# Patient Record
Sex: Female | Born: 1950
Health system: Southern US, Community
[De-identification: ages and names within clinical notes are randomized; demographics above are authoritative.]

## PROBLEM LIST (undated history)

## (undated) ENCOUNTER — Emergency Department (HOSPITAL_COMMUNITY): Admission: EM | Payer: Medicare HMO | Source: Home / Self Care

## (undated) DIAGNOSIS — J449 Chronic obstructive pulmonary disease, unspecified: Secondary | ICD-10-CM

## (undated) DIAGNOSIS — N179 Acute kidney failure, unspecified: Secondary | ICD-10-CM

## (undated) DIAGNOSIS — R0600 Dyspnea, unspecified: Secondary | ICD-10-CM

## (undated) DIAGNOSIS — I509 Heart failure, unspecified: Secondary | ICD-10-CM

## (undated) DIAGNOSIS — E119 Type 2 diabetes mellitus without complications: Secondary | ICD-10-CM

## (undated) DIAGNOSIS — I214 Non-ST elevation (NSTEMI) myocardial infarction: Secondary | ICD-10-CM

## (undated) DIAGNOSIS — I10 Essential (primary) hypertension: Secondary | ICD-10-CM

---
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
## 2018-02-13 DIAGNOSIS — H5203 Hypermetropia, bilateral: Secondary | ICD-10-CM | POA: Diagnosis not present

## 2018-02-13 DIAGNOSIS — E119 Type 2 diabetes mellitus without complications: Secondary | ICD-10-CM | POA: Diagnosis not present

## 2018-02-13 DIAGNOSIS — H524 Presbyopia: Secondary | ICD-10-CM | POA: Diagnosis not present

## 2018-02-21 DIAGNOSIS — F17210 Nicotine dependence, cigarettes, uncomplicated: Secondary | ICD-10-CM | POA: Diagnosis not present

## 2018-02-21 DIAGNOSIS — I10 Essential (primary) hypertension: Secondary | ICD-10-CM | POA: Diagnosis not present

## 2018-02-21 DIAGNOSIS — E119 Type 2 diabetes mellitus without complications: Secondary | ICD-10-CM | POA: Diagnosis not present

## 2018-02-22 ENCOUNTER — Other Ambulatory Visit: Payer: Self-pay | Admitting: Family Medicine

## 2018-02-22 DIAGNOSIS — Z1231 Encounter for screening mammogram for malignant neoplasm of breast: Secondary | ICD-10-CM

## 2018-02-22 DIAGNOSIS — R5381 Other malaise: Secondary | ICD-10-CM

## 2018-02-23 ENCOUNTER — Other Ambulatory Visit: Payer: Self-pay | Admitting: Family Medicine

## 2018-02-23 DIAGNOSIS — E2839 Other primary ovarian failure: Secondary | ICD-10-CM

## 2018-03-06 DIAGNOSIS — H40053 Ocular hypertension, bilateral: Secondary | ICD-10-CM | POA: Diagnosis not present

## 2018-03-06 DIAGNOSIS — H40013 Open angle with borderline findings, low risk, bilateral: Secondary | ICD-10-CM | POA: Diagnosis not present

## 2018-05-09 ENCOUNTER — Ambulatory Visit
Admission: RE | Admit: 2018-05-09 | Discharge: 2018-05-09 | Disposition: A | Discharge status: Home / Self Care | Payer: Medicare HMO | Source: Ambulatory Visit | Attending: Family Medicine | Admitting: Family Medicine

## 2018-05-09 ENCOUNTER — Encounter: Payer: Self-pay | Admitting: Radiology

## 2018-05-09 DIAGNOSIS — Z780 Asymptomatic menopausal state: Secondary | ICD-10-CM | POA: Diagnosis not present

## 2018-05-09 DIAGNOSIS — Z1231 Encounter for screening mammogram for malignant neoplasm of breast: Secondary | ICD-10-CM | POA: Diagnosis not present

## 2018-05-09 DIAGNOSIS — M85851 Other specified disorders of bone density and structure, right thigh: Secondary | ICD-10-CM | POA: Diagnosis not present

## 2018-05-09 DIAGNOSIS — E2839 Other primary ovarian failure: Secondary | ICD-10-CM

## 2018-05-30 DIAGNOSIS — I10 Essential (primary) hypertension: Secondary | ICD-10-CM | POA: Diagnosis not present

## 2018-05-30 DIAGNOSIS — F17210 Nicotine dependence, cigarettes, uncomplicated: Secondary | ICD-10-CM | POA: Diagnosis not present

## 2018-05-30 DIAGNOSIS — E119 Type 2 diabetes mellitus without complications: Secondary | ICD-10-CM | POA: Diagnosis not present

## 2018-08-06 ENCOUNTER — Inpatient Hospital Stay (HOSPITAL_COMMUNITY): Payer: Medicare HMO

## 2018-08-06 ENCOUNTER — Inpatient Hospital Stay (HOSPITAL_COMMUNITY)
Admission: EM | Admit: 2018-08-06 | Discharge: 2018-08-17 | DRG: 246 | Disposition: A | Discharge status: Home Health | Payer: Medicare HMO | Attending: Internal Medicine | Admitting: Internal Medicine

## 2018-08-06 ENCOUNTER — Emergency Department (HOSPITAL_COMMUNITY): Payer: Medicare HMO

## 2018-08-06 ENCOUNTER — Encounter (HOSPITAL_COMMUNITY): Payer: Self-pay | Admitting: Emergency Medicine

## 2018-08-06 DIAGNOSIS — I272 Pulmonary hypertension, unspecified: Secondary | ICD-10-CM | POA: Diagnosis present

## 2018-08-06 DIAGNOSIS — R34 Anuria and oliguria: Secondary | ICD-10-CM | POA: Diagnosis not present

## 2018-08-06 DIAGNOSIS — J449 Chronic obstructive pulmonary disease, unspecified: Secondary | ICD-10-CM | POA: Diagnosis present

## 2018-08-06 DIAGNOSIS — F17210 Nicotine dependence, cigarettes, uncomplicated: Secondary | ICD-10-CM | POA: Diagnosis present

## 2018-08-06 DIAGNOSIS — E1165 Type 2 diabetes mellitus with hyperglycemia: Secondary | ICD-10-CM | POA: Diagnosis present

## 2018-08-06 DIAGNOSIS — E119 Type 2 diabetes mellitus without complications: Secondary | ICD-10-CM

## 2018-08-06 DIAGNOSIS — J984 Other disorders of lung: Secondary | ICD-10-CM | POA: Diagnosis not present

## 2018-08-06 DIAGNOSIS — J811 Chronic pulmonary edema: Secondary | ICD-10-CM

## 2018-08-06 DIAGNOSIS — Z01818 Encounter for other preprocedural examination: Secondary | ICD-10-CM

## 2018-08-06 DIAGNOSIS — I428 Other cardiomyopathies: Secondary | ICD-10-CM | POA: Diagnosis present

## 2018-08-06 DIAGNOSIS — R0600 Dyspnea, unspecified: Secondary | ICD-10-CM

## 2018-08-06 DIAGNOSIS — R0902 Hypoxemia: Secondary | ICD-10-CM | POA: Diagnosis not present

## 2018-08-06 DIAGNOSIS — E874 Mixed disorder of acid-base balance: Secondary | ICD-10-CM | POA: Diagnosis present

## 2018-08-06 DIAGNOSIS — I5021 Acute systolic (congestive) heart failure: Secondary | ICD-10-CM | POA: Diagnosis not present

## 2018-08-06 DIAGNOSIS — I10 Essential (primary) hypertension: Secondary | ICD-10-CM | POA: Diagnosis not present

## 2018-08-06 DIAGNOSIS — I361 Nonrheumatic tricuspid (valve) insufficiency: Secondary | ICD-10-CM | POA: Diagnosis not present

## 2018-08-06 DIAGNOSIS — R570 Cardiogenic shock: Secondary | ICD-10-CM | POA: Diagnosis not present

## 2018-08-06 DIAGNOSIS — Z9911 Dependence on respirator [ventilator] status: Secondary | ICD-10-CM | POA: Diagnosis not present

## 2018-08-06 DIAGNOSIS — J9690 Respiratory failure, unspecified, unspecified whether with hypoxia or hypercapnia: Secondary | ICD-10-CM | POA: Diagnosis not present

## 2018-08-06 DIAGNOSIS — E669 Obesity, unspecified: Secondary | ICD-10-CM | POA: Diagnosis present

## 2018-08-06 DIAGNOSIS — I5041 Acute combined systolic (congestive) and diastolic (congestive) heart failure: Secondary | ICD-10-CM | POA: Diagnosis present

## 2018-08-06 DIAGNOSIS — I340 Nonrheumatic mitral (valve) insufficiency: Secondary | ICD-10-CM

## 2018-08-06 DIAGNOSIS — Z955 Presence of coronary angioplasty implant and graft: Secondary | ICD-10-CM

## 2018-08-06 DIAGNOSIS — I2510 Atherosclerotic heart disease of native coronary artery without angina pectoris: Secondary | ICD-10-CM | POA: Diagnosis not present

## 2018-08-06 DIAGNOSIS — Z538 Procedure and treatment not carried out for other reasons: Secondary | ICD-10-CM | POA: Diagnosis not present

## 2018-08-06 DIAGNOSIS — I509 Heart failure, unspecified: Secondary | ICD-10-CM | POA: Diagnosis not present

## 2018-08-06 DIAGNOSIS — R609 Edema, unspecified: Secondary | ICD-10-CM

## 2018-08-06 DIAGNOSIS — N179 Acute kidney failure, unspecified: Secondary | ICD-10-CM | POA: Diagnosis not present

## 2018-08-06 DIAGNOSIS — R42 Dizziness and giddiness: Secondary | ICD-10-CM | POA: Diagnosis not present

## 2018-08-06 DIAGNOSIS — E872 Acidosis, unspecified: Secondary | ICD-10-CM

## 2018-08-06 DIAGNOSIS — R000 Tachycardia, unspecified: Secondary | ICD-10-CM | POA: Diagnosis not present

## 2018-08-06 DIAGNOSIS — I5043 Acute on chronic combined systolic (congestive) and diastolic (congestive) heart failure: Secondary | ICD-10-CM | POA: Diagnosis not present

## 2018-08-06 DIAGNOSIS — Z6827 Body mass index (BMI) 27.0-27.9, adult: Secondary | ICD-10-CM

## 2018-08-06 DIAGNOSIS — I214 Non-ST elevation (NSTEMI) myocardial infarction: Principal | ICD-10-CM

## 2018-08-06 DIAGNOSIS — Z79899 Other long term (current) drug therapy: Secondary | ICD-10-CM

## 2018-08-06 DIAGNOSIS — Z4682 Encounter for fitting and adjustment of non-vascular catheter: Secondary | ICD-10-CM | POA: Diagnosis not present

## 2018-08-06 DIAGNOSIS — I371 Nonrheumatic pulmonary valve insufficiency: Secondary | ICD-10-CM | POA: Diagnosis present

## 2018-08-06 DIAGNOSIS — I071 Rheumatic tricuspid insufficiency: Secondary | ICD-10-CM | POA: Diagnosis present

## 2018-08-06 DIAGNOSIS — Z794 Long term (current) use of insulin: Secondary | ICD-10-CM

## 2018-08-06 DIAGNOSIS — J810 Acute pulmonary edema: Secondary | ICD-10-CM | POA: Diagnosis not present

## 2018-08-06 DIAGNOSIS — J9601 Acute respiratory failure with hypoxia: Secondary | ICD-10-CM | POA: Diagnosis present

## 2018-08-06 DIAGNOSIS — Z9289 Personal history of other medical treatment: Secondary | ICD-10-CM

## 2018-08-06 DIAGNOSIS — R079 Chest pain, unspecified: Secondary | ICD-10-CM | POA: Diagnosis not present

## 2018-08-06 DIAGNOSIS — E876 Hypokalemia: Secondary | ICD-10-CM | POA: Diagnosis not present

## 2018-08-06 DIAGNOSIS — J9602 Acute respiratory failure with hypercapnia: Secondary | ICD-10-CM | POA: Diagnosis not present

## 2018-08-06 DIAGNOSIS — Z452 Encounter for adjustment and management of vascular access device: Secondary | ICD-10-CM

## 2018-08-06 DIAGNOSIS — R0602 Shortness of breath: Secondary | ICD-10-CM

## 2018-08-06 DIAGNOSIS — R0989 Other specified symptoms and signs involving the circulatory and respiratory systems: Secondary | ICD-10-CM | POA: Diagnosis not present

## 2018-08-06 DIAGNOSIS — G9340 Encephalopathy, unspecified: Secondary | ICD-10-CM | POA: Diagnosis not present

## 2018-08-06 DIAGNOSIS — E878 Other disorders of electrolyte and fluid balance, not elsewhere classified: Secondary | ICD-10-CM | POA: Diagnosis not present

## 2018-08-06 DIAGNOSIS — J181 Lobar pneumonia, unspecified organism: Secondary | ICD-10-CM | POA: Diagnosis not present

## 2018-08-06 DIAGNOSIS — I110 Hypertensive heart disease with heart failure: Secondary | ICD-10-CM | POA: Diagnosis not present

## 2018-08-06 DIAGNOSIS — R739 Hyperglycemia, unspecified: Secondary | ICD-10-CM | POA: Diagnosis not present

## 2018-08-06 DIAGNOSIS — R05 Cough: Secondary | ICD-10-CM | POA: Diagnosis not present

## 2018-08-06 HISTORY — DX: Essential (primary) hypertension: I10

## 2018-08-06 HISTORY — DX: Type 2 diabetes mellitus without complications: E11.9

## 2018-08-06 LAB — COMPREHENSIVE METABOLIC PANEL
ALT: 12 U/L (ref 0–44)
AST: 22 U/L (ref 15–41)
Albumin: 2.5 g/dL — ABNORMAL LOW (ref 3.5–5.0)
Alkaline Phosphatase: 83 U/L (ref 38–126)
Anion gap: 22 — ABNORMAL HIGH (ref 5–15)
BILIRUBIN TOTAL: 1.4 mg/dL — AB (ref 0.3–1.2)
BUN: 11 mg/dL (ref 8–23)
CO2: 19 mmol/L — ABNORMAL LOW (ref 22–32)
Calcium: 8.5 mg/dL — ABNORMAL LOW (ref 8.9–10.3)
Chloride: 91 mmol/L — ABNORMAL LOW (ref 98–111)
Creatinine, Ser: 1.11 mg/dL — ABNORMAL HIGH (ref 0.44–1.00)
GFR calc Af Amer: 59 mL/min — ABNORMAL LOW (ref >60)
GFR, EST NON AFRICAN AMERICAN: 51 mL/min — AB (ref >60)
Glucose, Bld: 264 mg/dL — ABNORMAL HIGH (ref 70–99)
Potassium: 2.6 mmol/L — CL (ref 3.5–5.1)
Sodium: 132 mmol/L — ABNORMAL LOW (ref 135–145)
Total Protein: 7 g/dL (ref 6.5–8.1)

## 2018-08-06 LAB — POCT I-STAT EG7
Acid-Base Excess: 3 mmol/L — ABNORMAL HIGH (ref 0.0–2.0)
Bicarbonate: 22.1 mmol/L (ref 20.0–28.0)
Calcium, Ion: 0.63 mmol/L — CL (ref 1.15–1.40)
HCT: 45 % (ref 36.0–46.0)
HEMOGLOBIN: 15.3 g/dL — AB (ref 12.0–15.0)
O2 Saturation: 100 %
Potassium: 2.6 mmol/L — CL (ref 3.5–5.1)
Sodium: 127 mmol/L — ABNORMAL LOW (ref 135–145)
TCO2: 23 mmol/L (ref 22–32)
pCO2, Ven: 20.9 mmHg — ABNORMAL LOW (ref 44.0–60.0)
pH, Ven: 7.633 (ref 7.250–7.430)
pO2, Ven: 207 mmHg — ABNORMAL HIGH (ref 32.0–45.0)

## 2018-08-06 LAB — CBC WITH DIFFERENTIAL/PLATELET
Abs Immature Granulocytes: 0.03 10*3/uL (ref 0.00–0.07)
BASOS PCT: 0 %
Basophils Absolute: 0 10*3/uL (ref 0.0–0.1)
Eosinophils Absolute: 0.1 10*3/uL (ref 0.0–0.5)
Eosinophils Relative: 2 %
HCT: 46 % (ref 36.0–46.0)
Hemoglobin: 15.5 g/dL — ABNORMAL HIGH (ref 12.0–15.0)
Immature Granulocytes: 0 %
Lymphocytes Relative: 23 %
Lymphs Abs: 1.8 10*3/uL (ref 0.7–4.0)
MCH: 28.9 pg (ref 26.0–34.0)
MCHC: 33.7 g/dL (ref 30.0–36.0)
MCV: 85.8 fL (ref 80.0–100.0)
Monocytes Absolute: 0.8 10*3/uL (ref 0.1–1.0)
Monocytes Relative: 11 %
NRBC: 0 % (ref 0.0–0.2)
Neutro Abs: 4.8 10*3/uL (ref 1.7–7.7)
Neutrophils Relative %: 64 %
Platelets: 325 10*3/uL (ref 150–400)
RBC: 5.36 MIL/uL — ABNORMAL HIGH (ref 3.87–5.11)
RDW: 13.4 % (ref 11.5–15.5)
WBC: 7.6 10*3/uL (ref 4.0–10.5)

## 2018-08-06 LAB — POTASSIUM: Potassium: 2.6 mmol/L — CL (ref 3.5–5.1)

## 2018-08-06 LAB — GLUCOSE, CAPILLARY: Glucose-Capillary: 346 mg/dL — ABNORMAL HIGH (ref 70–99)

## 2018-08-06 LAB — TROPONIN I
Troponin I: 7.53 ng/mL (ref <0.03)
Troponin I: 6.55 ng/mL (ref <0.03)
Troponin I: 6.29 ng/mL (ref <0.03)

## 2018-08-06 LAB — HEPARIN LEVEL (UNFRACTIONATED): Heparin Unfractionated: 0.92 IU/mL — ABNORMAL HIGH (ref 0.30–0.70)

## 2018-08-06 LAB — HEMOGLOBIN A1C
Hgb A1c MFr Bld: 10.2 % — ABNORMAL HIGH (ref 4.8–5.6)
MEAN PLASMA GLUCOSE: 246.04 mg/dL

## 2018-08-06 LAB — LIPASE, BLOOD: Lipase: 19 U/L (ref 11–51)

## 2018-08-06 LAB — LACTIC ACID, PLASMA
Lactic Acid, Venous: 2.2 mmol/L (ref 0.5–1.9)
Lactic Acid, Venous: 4.1 mmol/L (ref 0.5–1.9)
Lactic Acid, Venous: 6.6 mmol/L (ref 0.5–1.9)

## 2018-08-06 LAB — ECHOCARDIOGRAM COMPLETE
Height: 62 in
Weight: 2448 oz

## 2018-08-06 LAB — CBG MONITORING, ED
Glucose-Capillary: 242 mg/dL — ABNORMAL HIGH (ref 70–99)
Glucose-Capillary: 372 mg/dL — ABNORMAL HIGH (ref 70–99)

## 2018-08-06 LAB — MAGNESIUM: Magnesium: 1.6 mg/dL — ABNORMAL LOW (ref 1.7–2.4)

## 2018-08-06 LAB — BRAIN NATRIURETIC PEPTIDE: B Natriuretic Peptide: 1535.2 pg/mL — ABNORMAL HIGH (ref 0.0–100.0)

## 2018-08-06 MED ORDER — FUROSEMIDE 10 MG/ML IJ SOLN
40.0000 mg | Freq: Once | INTRAMUSCULAR | Status: AC
  Administered 2018-08-06: 40 mg via INTRAVENOUS
  Filled 2018-08-06: qty 4

## 2018-08-06 MED ORDER — ALBUTEROL SULFATE (2.5 MG/3ML) 0.083% IN NEBU
2.5000 mg | INHALATION_SOLUTION | RESPIRATORY_TRACT | Status: DC | PRN
  Administered 2018-08-06: 2.5 mg via RESPIRATORY_TRACT
  Filled 2018-08-06: qty 3

## 2018-08-06 MED ORDER — ACETAMINOPHEN 325 MG PO TABS: 650.0000 mg | ORAL_TABLET | Freq: Four times a day (QID) | ORAL | Status: DC | PRN

## 2018-08-06 MED ORDER — ACETAMINOPHEN 650 MG RE SUPP: 650.0000 mg | Freq: Four times a day (QID) | RECTAL | Status: DC | PRN

## 2018-08-06 MED ORDER — FUROSEMIDE 10 MG/ML IJ SOLN
20.0000 mg | Freq: Once | INTRAMUSCULAR | Status: AC
  Administered 2018-08-06: 20 mg via INTRAVENOUS
  Filled 2018-08-06: qty 2

## 2018-08-06 MED ORDER — POTASSIUM CHLORIDE CRYS ER 20 MEQ PO TBCR
40.0000 meq | EXTENDED_RELEASE_TABLET | Freq: Two times a day (BID) | ORAL | Status: DC
  Administered 2018-08-06: 40 meq via ORAL
  Filled 2018-08-06 (×2): qty 2

## 2018-08-06 MED ORDER — INSULIN ASPART 100 UNIT/ML ~~LOC~~ SOLN
0.0000 [IU] | Freq: Three times a day (TID) | SUBCUTANEOUS | Status: DC
  Administered 2018-08-06: 9 [IU] via SUBCUTANEOUS

## 2018-08-06 MED ORDER — SODIUM CHLORIDE 0.9 % IV SOLN
250.0000 mL | INTRAVENOUS | Status: DC | PRN
  Administered 2018-08-07: 250 mL via INTRAVENOUS

## 2018-08-06 MED ORDER — INSULIN ASPART 100 UNIT/ML ~~LOC~~ SOLN
0.0000 [IU] | Freq: Every day | SUBCUTANEOUS | Status: DC
  Administered 2018-08-06: 5 [IU] via SUBCUTANEOUS

## 2018-08-06 MED ORDER — ASPIRIN 81 MG PO CHEW
324.0000 mg | CHEWABLE_TABLET | Freq: Once | ORAL | Status: AC
  Administered 2018-08-06: 324 mg via ORAL
  Filled 2018-08-06: qty 4

## 2018-08-06 MED ORDER — SODIUM CHLORIDE 0.9% FLUSH
3.0000 mL | Freq: Two times a day (BID) | INTRAVENOUS | Status: DC
  Administered 2018-08-07 – 2018-08-10 (×5): 3 mL via INTRAVENOUS

## 2018-08-06 MED ORDER — MAGNESIUM SULFATE IN D5W 1-5 GM/100ML-% IV SOLN
1.0000 g | Freq: Once | INTRAVENOUS | Status: AC
  Administered 2018-08-06: 1 g via INTRAVENOUS
  Filled 2018-08-06: qty 100

## 2018-08-06 MED ORDER — HEPARIN BOLUS VIA INFUSION
4000.0000 [IU] | Freq: Once | INTRAVENOUS | Status: AC
  Administered 2018-08-06: 4000 [IU] via INTRAVENOUS
  Filled 2018-08-06: qty 4000

## 2018-08-06 MED ORDER — ONDANSETRON HCL 4 MG PO TABS: 4.0000 mg | ORAL_TABLET | Freq: Four times a day (QID) | ORAL | Status: DC | PRN

## 2018-08-06 MED ORDER — POTASSIUM CHLORIDE 10 MEQ/100ML IV SOLN
10.0000 meq | Freq: Once | INTRAVENOUS | Status: AC
  Administered 2018-08-06: 10 meq via INTRAVENOUS
  Filled 2018-08-06: qty 100

## 2018-08-06 MED ORDER — HEPARIN (PORCINE) 25000 UT/250ML-% IV SOLN
1050.0000 [IU]/h | INTRAVENOUS | Status: DC
  Administered 2018-08-06: 900 [IU]/h via INTRAVENOUS
  Administered 2018-08-07: 600 [IU]/h via INTRAVENOUS
  Administered 2018-08-08: 900 [IU]/h via INTRAVENOUS
  Administered 2018-08-10: 1050 [IU]/h via INTRAVENOUS
  Filled 2018-08-06 (×4): qty 250

## 2018-08-06 MED ORDER — POTASSIUM CHLORIDE CRYS ER 20 MEQ PO TBCR
40.0000 meq | EXTENDED_RELEASE_TABLET | Freq: Once | ORAL | Status: AC
  Administered 2018-08-06: 40 meq via ORAL
  Filled 2018-08-06: qty 2

## 2018-08-06 MED ORDER — POTASSIUM CHLORIDE 10 MEQ/100ML IV SOLN
10.0000 meq | INTRAVENOUS | Status: DC
  Administered 2018-08-06: 10 meq via INTRAVENOUS
  Filled 2018-08-06 (×2): qty 100

## 2018-08-06 MED ORDER — ASPIRIN EC 81 MG PO TBEC: 81.0000 mg | DELAYED_RELEASE_TABLET | Freq: Every day | ORAL | Status: DC

## 2018-08-06 MED ORDER — POTASSIUM CHLORIDE CRYS ER 20 MEQ PO TBCR
40.0000 meq | EXTENDED_RELEASE_TABLET | Freq: Once | ORAL | Status: AC
  Administered 2018-08-06: 40 meq via ORAL

## 2018-08-06 MED ORDER — ONDANSETRON HCL 4 MG/2ML IJ SOLN
4.0000 mg | Freq: Four times a day (QID) | INTRAMUSCULAR | Status: DC | PRN
  Administered 2018-08-07: 4 mg via INTRAVENOUS
  Filled 2018-08-06: qty 2

## 2018-08-06 MED ORDER — ATORVASTATIN CALCIUM 40 MG PO TABS
40.0000 mg | ORAL_TABLET | Freq: Every day | ORAL | Status: DC
  Administered 2018-08-06: 40 mg via ORAL
  Filled 2018-08-06: qty 1

## 2018-08-06 MED ORDER — SODIUM CHLORIDE 0.9 % IV BOLUS
500.0000 mL | Freq: Once | INTRAVENOUS | Status: AC
  Administered 2018-08-06: 500 mL via INTRAVENOUS

## 2018-08-06 MED ORDER — FUROSEMIDE 10 MG/ML IJ SOLN
40.0000 mg | INTRAMUSCULAR | Status: AC
  Administered 2018-08-06: 40 mg via INTRAVENOUS
  Filled 2018-08-06: qty 4

## 2018-08-06 MED ORDER — INSULIN GLARGINE 100 UNIT/ML ~~LOC~~ SOLN
50.0000 [IU] | Freq: Every day | SUBCUTANEOUS | Status: DC
  Administered 2018-08-07: 50 [IU] via SUBCUTANEOUS
  Filled 2018-08-06 (×2): qty 0.5

## 2018-08-06 MED ORDER — POTASSIUM CHLORIDE 10 MEQ/100ML IV SOLN
10.0000 meq | INTRAVENOUS | Status: AC
  Administered 2018-08-06 (×2): 10 meq via INTRAVENOUS
  Filled 2018-08-06 (×2): qty 100

## 2018-08-06 MED ORDER — SODIUM CHLORIDE 0.9% FLUSH
3.0000 mL | INTRAVENOUS | Status: DC | PRN
  Administered 2018-08-10: 3 mL via INTRAVENOUS
  Filled 2018-08-06: qty 3

## 2018-08-06 NOTE — Progress Notes (Signed)
ANTICOAGULATION CONSULT NOTE - Initial Consult  Pharmacy Consult for Heparin Indication: chest pain/ACS  Allergies no known allergies  Patient Measurements: Height: 5\' 2"  (157.5 cm) Weight: 153 lb (69.4 kg) IBW/kg (Calculated) : 50.1 Heparin Dosing Weight:  64.6 kg  Vital Signs: Temp: 97.8 F (36.6 C) (02/09 0615) Temp Source: Oral (02/09 0615) BP: 131/87 (02/09 0730) Pulse Rate: 95 (02/09 0730)  Labs: Recent Labs    08/06/18 0628  HGB 15.5*  HCT 46.0  PLT 325  TROPONINI 7.53*    CrCl cannot be calculated (No successful lab value found.).   Medical History: Past Medical History:  Diagnosis Date  . Diabetes mellitus without complication ([place])   . Hypertension    Assessment: CC/HPI: Hyperglycemia, emesis, dizziness. Tropoinin 7.53. - Chest x-ray shows vascular congestion, interstitial edema, pleural effusions consistent with CHF.  PMH: DM, HTN  Anticoag: Baseline CBC WNL. Start IV heparin for elevated tropoinin.  Goal of Therapy:  Heparin level 0.3-0.7 units/ml Monitor platelets by anticoagulation protocol: Yes   Plan:  Heparin 4000 unit IV bolus Heparin infusion at 900 units/hr Heparin level in 6-8 hrs Daily HL and CBC  Zahriyah Joo S. Alford Highland, PharmD, [place] Clinical Staff Pharmacist Eilene Ghazi Stillinger 08/06/2018,8:19 AM

## 2018-08-06 NOTE — Progress Notes (Signed)
ANTICOAGULATION CONSULT NOTE  Pharmacy Consult for Heparin Indication: chest pain/ACS  No Known Allergies  Patient Measurements: Height: 5\' 2"  (157.5 cm) Weight: 139 lb 8 oz (63.3 kg) IBW/kg (Calculated) : 50.1 Heparin Dosing Weight:  64.6 kg  Vital Signs: BP: 143/97 (02/09 1908) Pulse Rate: 100 (02/09 1908)  Labs: Recent Labs    08/06/18 0623 08/06/18 2878 08/06/18 0940 08/06/18 1148 08/06/18 1321 08/06/18 1714  HGB  --  15.5* 15.3*  --   --   --   HCT  --  46.0 45.0  --   --   --   PLT  --  325  --   --   --   --   HEPARINUNFRC  --   --   --   --   --  0.92*  CREATININE 1.11*  --   --   --   --   --   TROPONINI  --  7.53*  --  6.55* 6.29*  --     Estimated Creatinine Clearance: 41.8 mL/min (A) (by C-G formula based on SCr of 1.11 mg/dL (H)).   Medical History: Past Medical History:  Diagnosis Date  . Diabetes mellitus without complication ([place])   . Hypertension     Assessment: 68 yo female on heparin for ACS. Troponin up to 6.55. Plans noted for cath -initial heparin level above goal at 0.92   Goal of Therapy:  Heparin level 0.3-0.7 units/ml Monitor platelets by anticoagulation protocol: Yes   Plan:  -Decrease heparin to 750 units/hr -Heparin level in 6 hours and daily wth CBC daily   Hildred Laser, PharmD Clinical Pharmacist **Pharmacist phone directory can now be found on [email or web address] ([email or web address]).  Listed under [place].

## 2018-08-06 NOTE — Consult Note (Signed)
Cardiology Consultation:   Patient ID: [name] MRN: [id number]; DOB: [birth year]  Admit date: 08/06/2018 Date of Consult: 08/06/2018  Primary Care Provider: [name], MD Primary Cardiologist: No primary care provider on file.  Primary Electrophysiologist:  None   Patient Profile:   [name] is a 68 y.o. female with a hx of diabetes mellitus type 2 on insulin, hypertension, and current smoking with a greater than 50-pack-year smoking history who is being seen today for the evaluation of ACS and heart failure at the request of Dr. Sloan Leiter.  History of Present Illness:   [name] is a 68 year old female with a past medical history of diabetes mellitus type 2, hypertension, current long-term smoking greater than 50 pack years.  She presents with approximately 2 weeks of fatigue and shortness of breath and more recently frequent vomiting and poor appetite.  She has no known history of cardiovascular disease and does not follow with a cardiologist.  When her fatigue and shortness of breath started she thought she had the flu and made an appointment with her doctor, however she was unable to get in to be seen and did not follow-up.  She denies any sick contacts.  She has not left the house in approximately 1 to 2 weeks due to fatigue and shortness of breath, and has also stopped smoking during this time due to dyspnea.  She denies a sensation of chest pressure or pain, but notes spontaneous vomiting and an overall feeling of malaise.  She does not have a formal diagnosis of COPD but did feel better after receiving a breathing treatment in the ED.  She does not know her family history and tells me she has no blood relatives.  She is an active smoker with a greater than 50-pack-year smoking history, currently smokes 1 pack/day.  She drinks alcohol daily approximately 2-3 drinks.  She lives at home with her wife independently.  She does not recall feeling the sensation of shortness of breath in  the past.  She denies palpitations, PND, orthopnea, leg swelling.  Denies a sensation of fever or chills.  Denies diarrhea, bleeding complications, dysuria, urinary frequency.  Past Medical History:  Diagnosis Date  . Diabetes mellitus without complication ([place])   . Hypertension     History reviewed. No pertinent surgical history.   Home Medications:  Prior to Admission medications   Medication Sig Start Date End Date Taking? Authorizing Provider  hydrochlorothiazide (MICROZIDE) 12.5 MG capsule Take 12.5 mg by mouth daily.   Yes [provider]  Insulin Glargine, 2 Unit Dial, (TOUJEO MAX SOLOSTAR) 300 UNIT/ML SOPN Inject 50 Units into the skin at bedtime.   Yes [provider]  metFORMIN (GLUCOPHAGE) 1000 MG tablet Take 1,000 mg by mouth 2 (two) times daily with a meal.   Yes [provider]    Inpatient Medications: Scheduled Meds: . aspirin EC  81 mg Oral Daily  . insulin aspart  0-5 Units Subcutaneous QHS  . insulin aspart  0-9 Units Subcutaneous TID WC  . Insulin Glargine (2 Unit Dial)  50 Units Subcutaneous QHS  . potassium chloride  40 mEq Oral BID  . sodium chloride flush  3 mL Intravenous Q12H   Continuous Infusions: . sodium chloride    . heparin 900 Units/hr (08/06/18 0844)  . potassium chloride     PRN Meds: sodium chloride, acetaminophen **OR** acetaminophen, albuterol, albuterol, ondansetron **OR** ondansetron (ZOFRAN) IV, sodium chloride flush  Allergies:   No Known Allergies  Social History:  Social History   Socioeconomic History  . Marital status: Married    Spouse name: Not on file  . Number of children: Not on file  . Years of education: Not on file  . Highest education level: Not on file  Occupational History  . Not on file  Social Needs  . Financial resource strain: Not on file  . Food insecurity:    Worry: Not on file    Inability: Not on file  . Transportation needs:    Medical: Not on file    Non-medical: Not  on file  Tobacco Use  . Smoking status: Never Smoker  . Smokeless tobacco: Never Used  Substance and Sexual Activity  . Alcohol use: Never    Frequency: Never  . Drug use: Never  . Sexual activity: Not on file  Lifestyle  . Physical activity:    Days per week: Not on file    Minutes per session: Not on file  . Stress: Not on file  Relationships  . Social connections:    Talks on phone: Not on file    Gets together: Not on file    Attends religious service: Not on file    Active member of club or organization: Not on file    Attends meetings of clubs or organizations: Not on file    Relationship status: Not on file  . Intimate partner violence:    Fear of current or ex partner: Not on file    Emotionally abused: Not on file    Physically abused: Not on file    Forced sexual activity: Not on file  Other Topics Concern  . Not on file  Social History Narrative  . Not on file    Family History:   She tells me she has no blood relatives and does not know her family history. Family History  Problem Relation Age of Onset  . Breast cancer Neg Hx      ROS:  Please see the history of present illness.   All other ROS reviewed and negative.     Physical Exam/Data:   Vitals:   08/06/18 1230 08/06/18 1315 08/06/18 1400 08/06/18 1500  BP: 137/74  (!) 145/98 (!) 143/97  Pulse: (!) 103 (!) 105 (!) 101 99  Resp: (!) 27 (!) 31 (!) 33 (!) 21  Temp:      TempSrc:      SpO2: 94% 95% 93% 94%  Weight:      Height:        Intake/Output Summary (Last 24 hours) at 08/06/2018 1514 Last data filed at 08/06/2018 1309 Gross per 24 hour  Intake 200 ml  Output -  Net 200 ml   Last 3 Weights 08/06/2018  Weight (lbs) 153 lb  Weight (kg) 69.4 kg     Body mass index is 27.98 kg/m.  General:  Well nourished, well developed, mildly short of breath HEENT: normal Neck: no JVD Cardiac:  normal S1, S2; RRR; no murmur  Lungs:  clear to auscultation bilaterally, no wheezing, rhonchi or rales    Abd: soft, nontender, no hepatomegaly  Ext: no edema Musculoskeletal:  No deformities, BUE and BLE strength normal and equal Skin: warm and dry   Neuro:  CNs 2-12 intact, no focal abnormalities noted Psych:  Normal affect   EKG:  The EKG was personally reviewed and demonstrates: Sinus tachycardia with possible anterior infarct with age indeterminate ST changes versus acute ST changes. Telemetry:  Telemetry was personally reviewed and demonstrates: Sinus  tachycardia with PVCs.  Relevant CV Studies: Echo pending.  Laboratory Data:  Chemistry Recent Labs  Lab 08/06/18 0623 08/06/18 0940  NA 132* 127*  K 2.6* 2.6*  CL 91*  --   CO2 19*  --   GLUCOSE 264*  --   BUN 11  --   CREATININE 1.11*  --   CALCIUM 8.5*  --   GFRNONAA 51*  --   GFRAA 59*  --   ANIONGAP 22*  --     Recent Labs  Lab 08/06/18 0623  PROT 7.0  ALBUMIN 2.5*  AST 22  ALT 12  ALKPHOS 83  BILITOT 1.4*   Hematology Recent Labs  Lab 08/06/18 0628 08/06/18 0940  WBC 7.6  --   RBC 5.36*  --   HGB 15.5* 15.3*  HCT 46.0 45.0  MCV 85.8  --   MCH 28.9  --   MCHC 33.7  --   RDW 13.4  --   PLT 325  --    Cardiac Enzymes Recent Labs  Lab 08/06/18 0628 08/06/18 1148 08/06/18 1321  TROPONINI 7.53* 6.55* 6.29*   No results for input(s): TROPIPOC in the last 168 hours.  BNP Recent Labs  Lab 08/06/18 0628  BNP 1,535.2*    DDimer No results for input(s): DDIMER in the last 168 hours.  Radiology/Studies:  Dg Chest 1 View  Result Date: 08/06/2018 CLINICAL DATA:  68 year old female with history of shortness of breath and cough for the past 2-3 days. Central chest pain while coughing. EXAM: CHEST  1 VIEW COMPARISON:  Chest x-ray 08/06/2018. FINDINGS: There is cephalization of the pulmonary vasculature, indistinctness of the interstitial markings, and patchy airspace disease throughout the lungs bilaterally suggestive of moderate pulmonary edema. Bibasilar opacities may reflect additional areas of  atelectasis and/or consolidation. Small bilateral pleural effusions. Mild cardiomegaly. Upper mediastinal contours are within normal limits. IMPRESSION: 1. The appearance the chest again suggest congestive heart failure, as above. The possibility of superimposed infection is not entirely excluded. Electronically Signed   By: Vinnie Langton M.D.   On: 08/06/2018 14:10   Dg Chest 2 View  Result Date: 08/06/2018 CLINICAL DATA:  Nausea, emesis, fatigue and dizziness. EXAM: CHEST - 2 VIEW COMPARISON:  None. FINDINGS: The heart is borderline enlarged. There is mild tortuosity of the thoracic aorta. Bilateral pleural effusions and increased interstitial markings with some Kerley B lines suggesting pulmonary edema. There are also patchy areas of airspace consolidation more suspicious for superimposed pneumonia. The bony thorax is intact. IMPRESSION: 1. Cardiac enlargement, central vascular congestion, interstitial edema and pleural effusions suggesting CHF. 2. Superimposed patchy infiltrates and atelectasis. Electronically Signed   By: Marijo Sanes M.D.   On: 08/06/2018 06:53    Assessment and Plan:   Principal Problem:   NSTEMI (non-ST elevated myocardial infarction) [facility]) Active Problems:   Hypokalemia   Diabetes mellitus ([place])   NSTEMI, initial episode of care [facility])   Respiratory failure ([place])  Despite no chest pain I believe her anginal equivalent may be shortness of breath and nausea vomiting, and she had a significantly elevated troponin this morning at 7.5.  She also has some concerning ST changes, however she is likely in a later phase of her presentation.  I have reviewed this with several colleagues and an early invasive approach is preferred.  We will plan for early coronary angiography tomorrow morning.  Remain on heparin overnight.  She is tachycardic which may be secondary to recently receiving bronchodilators, however I cannot ensure  that this is not related to cardiac decompensation.   Would consider beta-blockade tomorrow if she continues to improve.  She has a very high likelihood of coronary artery disease with her risk factors and troponin.  We have discussed coronary angiography and she has participated and informed consent.  She is received Lasix 40 mg IV twice today, we will monitor for response.  May likely need continued diuresis given evidence of heart failure on chest x-ray.  Echocardiogram pending for assessment of systolic function.  Recommend daily weights and strict ins and outs.  Potassium is significantly decreased, this is being replaced by internal medicine.   Start atorvastatin 40 mg daily for elevated cardiovascular risk and diabetes.  INFORMED CONSENT: I have reviewed the risks, indications, and alternatives to cardiac catheterization, possible angioplasty, and stenting with the patient. Risks include but are not limited to bleeding, infection, vascular injury, stroke, myocardial infection, arrhythmia, kidney injury, radiation-related injury in the case of prolonged fluoroscopy use, emergency cardiac surgery, and death. The patient understands the risks of serious complication is 1-2 in 3403 with diagnostic cardiac cath and 1-2% or less with angioplasty/stenting.   I have instructed the patient that dual antiplatelet therapy should be taken for 1 year without interruption if indicated.  We have discussed the consequences of interrupted dual antiplatelet therapy and the risk for in-stent thrombosis.    For questions or updates, please contact [place] Please consult [email or web address] for contact info under     Signed, Elouise Munroe, MD  08/06/2018 3:14 PM

## 2018-08-06 NOTE — ED Notes (Signed)
Per conversation w/ MD Ghimire, ok for pt to eat until midnight. Plan for cath in the AM.

## 2018-08-06 NOTE — Progress Notes (Signed)
CRITICAL VALUE ALERT  Critical Value:  Potassium 2.6  Date & Time Notied:  2/9 8:45  Provider Notified: [place]  Orders Received/Actions taken: Potassum Choride 29mEq   QHR X 3 BAGS

## 2018-08-06 NOTE — H&P (Signed)
History and Physical    Fernando Scarfone FKC:[id number] DOB: [birth year] DOA: 08/06/2018  PCP: [name], MD  Patient coming from: home   I have personally briefly reviewed patient's old medical records available.   Chief Complaint: Nausea vomiting shortness of breath cough.  HPI: [name] is a 68 y.o. female with medical history significant of type 2 diabetes on insulin, hypertension,  no history of primary lung disease or coronary artery disease presenting to the hospital with 10 days or more of multiple symptoms including cough, malaise, frequent vomiting poor appetite.  According the patient, she has hypertension and diabetes, however she never had any heart attack.  She was at her usual state of health until about 10 days ago when she started getting shortness of breath with exertion, not having good appetite.  Today morning she tried to ambulate she got dizzy so they called EMS. Patient denies any fever or chills.  Denies any chest pain.  Shortness of breath is exertional.  Denies any leg swelling.  Denies any weight gain.  No sick contacts.  No recent travel. ED Course: Patient arrived to the emergency room.  She was slightly tachypneic and wheezing.  She was needing 2 to 3 L of oxygen to keep saturations.  Afebrile.  Blood pressures were stable.  Initial lactic acid was 2.  WBC count was normal.  Potassium was 2.6, she takes hydrochlorothiazide at home.  Creatinine 1.1.  Troponin was 7.5.  Twelve-lead EKG shows no acute ST-T wave changes.  Patient was given potassium supplement, IV Lasix, cardiology was consulted, started on heparin infusion and admission requested.   Review of Systems: As per HPI otherwise 10 point review of systems negative.    Past Medical History:  Diagnosis Date  . Diabetes mellitus without complication ([place])   . Hypertension     History reviewed. No pertinent surgical history.   reports that she has never smoked. She has never used smokeless tobacco. She  reports that she does not drink alcohol or use drugs.  No Known Allergies  Family History  Problem Relation Age of Onset  . Breast cancer Neg Hx      Prior to Admission medications   Medication Sig Start Date End Date Taking? Authorizing Provider  hydrochlorothiazide (MICROZIDE) 12.5 MG capsule Take 12.5 mg by mouth daily.   Yes [provider]  Insulin Glargine, 2 Unit Dial, (TOUJEO MAX SOLOSTAR) 300 UNIT/ML SOPN Inject 50 Units into the skin at bedtime.   Yes [provider]  metFORMIN (GLUCOPHAGE) 1000 MG tablet Take 1,000 mg by mouth 2 (two) times daily with a meal.   Yes [provider]    Physical Exam: Vitals:   08/06/18 0930 08/06/18 0945 08/06/18 1130 08/06/18 1230  BP: (!) 149/101  (!) 139/96 137/74  Pulse: 97 95 (!) 104 (!) 103  Resp: (!) 31 (!) 24 (!) 22 (!) 27  Temp:      TempSrc:      SpO2: 94% 98% 92% 94%  Weight:      Height:        Constitutional: NAD, calm, comfortable Vitals:   08/06/18 0930 08/06/18 0945 08/06/18 1130 08/06/18 1230  BP: (!) 149/101  (!) 139/96 137/74  Pulse: 97 95 (!) 104 (!) 103  Resp: (!) 31 (!) 24 (!) 22 (!) 27  Temp:      TempSrc:      SpO2: 94% 98% 92% 94%  Weight:      Height:  Eyes: PERRL, lids and conjunctivae normal ENMT: Mucous membranes are moist. Posterior pharynx clear of any exudate or lesions.Normal dentition.  Neck: normal, supple, no masses, no thyromegaly Respiratory: Bilateral poor air entry with expiratory wheezes. Normal respiratory effort. No accessory muscle use.  Moderate respiratory distress. Cardiovascular: Regular rate and rhythm, no murmurs / rubs / gallops. No extremity edema. 2+ pedal pulses. No carotid bruits.  Abdomen: no tenderness, no masses palpated. No hepatosplenomegaly. Bowel sounds positive.  Musculoskeletal: no clubbing / cyanosis. No joint deformity upper and lower extremities. Good ROM, no contractures. Normal muscle tone.  Skin: no rashes, lesions, ulcers.  No induration Neurologic: CN 2-12 grossly intact. Sensation intact, DTR normal. Strength 5/5 in all 4.  Psychiatric: Normal judgment and insight. Alert and oriented x 3. Normal mood.     Labs on Admission: I have personally reviewed following labs and imaging studies  CBC: Recent Labs  Lab 08/06/18 0628 08/06/18 0940  WBC 7.6  --   NEUTROABS 4.8  --   HGB 15.5* 15.3*  HCT 46.0 45.0  MCV 85.8  --   PLT 325  --    Basic Metabolic Panel: Recent Labs  Lab 08/06/18 0623 08/06/18 0628 08/06/18 0940  NA 132*  --  127*  K 2.6*  --  2.6*  CL 91*  --   --   CO2 19*  --   --   GLUCOSE 264*  --   --   BUN 11  --   --   CREATININE 1.11*  --   --   CALCIUM 8.5*  --   --   MG  --  1.6*  --    GFR: Estimated Creatinine Clearance: 43.6 mL/min (A) (by C-G formula based on SCr of 1.11 mg/dL (H)). Liver Function Tests: Recent Labs  Lab 08/06/18 0623  AST 22  ALT 12  ALKPHOS 83  BILITOT 1.4*  PROT 7.0  ALBUMIN 2.5*   Recent Labs  Lab 08/06/18 0628  LIPASE 19   No results for input(s): AMMONIA in the last 168 hours. Coagulation Profile: No results for input(s): INR, PROTIME in the last 168 hours. Cardiac Enzymes: Recent Labs  Lab 08/06/18 0628  TROPONINI 7.53*   BNP (last 3 results) No results for input(s): PROBNP in the last 8760 hours. HbA1C: No results for input(s): HGBA1C in the last 72 hours. CBG: Recent Labs  Lab 08/06/18 0623  GLUCAP 242*   Lipid Profile: No results for input(s): CHOL, HDL, LDLCALC, TRIG, CHOLHDL, LDLDIRECT in the last 72 hours. Thyroid Function Tests: No results for input(s): TSH, T4TOTAL, FREET4, T3FREE, THYROIDAB in the last 72 hours. Anemia Panel: No results for input(s): VITAMINB12, FOLATE, FERRITIN, TIBC, IRON, RETICCTPCT in the last 72 hours. Urine analysis: No results found for: COLORURINE, APPEARANCEUR, LABSPEC, [place], GLUCOSEU, HGBUR, BILIRUBINUR, KETONESUR, PROTEINUR, UROBILINOGEN, NITRITE, LEUKOCYTESUR  Radiological  Exams on Admission: Dg Chest 2 View  Result Date: 08/06/2018 CLINICAL DATA:  Nausea, emesis, fatigue and dizziness. EXAM: CHEST - 2 VIEW COMPARISON:  None. FINDINGS: The heart is borderline enlarged. There is mild tortuosity of the thoracic aorta. Bilateral pleural effusions and increased interstitial markings with some Kerley B lines suggesting pulmonary edema. There are also patchy areas of airspace consolidation more suspicious for superimposed pneumonia. The bony thorax is intact. IMPRESSION: 1. Cardiac enlargement, central vascular congestion, interstitial edema and pleural effusions suggesting CHF. 2. Superimposed patchy infiltrates and atelectasis. Electronically Signed   By: Marijo Sanes M.D.   On: 08/06/2018 06:53  EKG: Independently reviewed. Sinus rhythm.  Multiform ventricular premature complexes.  QTC is 534.  No ST elevation.  Assessment/Plan Principal Problem:   NSTEMI (non-ST elevated myocardial infarction) ([place]) Active Problems:   Hypokalemia   Diabetes mellitus ([place])   NSTEMI, initial episode of care ([place])   Respiratory failure ([place])     1.  Non-ST relation MI: Currently without chest pain. We will admit patient to the cardiac telemetry unit given severity of symptoms.  Patient does not have any chest pain. Cycle EKG and troponins. Supplemental oxygen to keep saturations more than 90%. Aspirin 81 mg daily. Nitroglycerin and morphine if recurrent chest pain. Started on heparin infusion.  Will be followed by cardiology.  Patient will probably need cardiac catheterization.  2.  Acute hypoxic respiratory failure: Suspect secondary to above.  proBNP was 2000.  Chest x-ray consistent with bilateral symmetrical opacities consistent with fluid overload.  Keep on oxygen to keep saturation more than 90%.  Will start patient on Lasix with aggressive potassium replacement.  Replace magnesium. 2D echocardiogram ordered.  3.  Lactic acidosis: Probably due to work of breathing.   Blood pressures are stable.  No evidence of localizing infection.  Will send blood cultures, will hold off on any antibiotics as of now.  Will need close monitoring.  4.  Hypokalemia: Due to ongoing use of hydrochlorothiazide.  Replace aggressively and monitor levels.  5.  Hypomagnesemia: Replace aggressively and monitor levels.  6.  Type 2 diabetes: On insulin at home.  Resume long-acting insulin.  Hold metformin as she may need contrasted studies.   DVT prophylaxis: On heparin infusion. Code Status: Full code. Family Communication: Patient's wife and sister were at the bedside. Disposition Plan: Home. Consults called: Cardiology. Admission status: Cardiac telemetry.  Inpatient.   Barb Merino MD [facility] Pager [phone]  If 7PM-7AM, please contact night-coverage [email or web address] Password [email or web address]  08/06/2018, 1:18 PM

## 2018-08-06 NOTE — ED Triage Notes (Addendum)
Patient arrived with EMS from home reports nausea , emesis ( x1) , poor appetite/fatigue and dizziness this evening , CBG= 320 , pt. has not taken her insulin for 3 days  . Occasional dry cough last week .

## 2018-08-06 NOTE — ED Provider Notes (Signed)
[facility] EMERGENCY DEPARTMENT Provider Note   CSN: [id number] Arrival date & time: 08/06/18  0601     History   Chief Complaint Chief Complaint  Patient presents with  . Hyperglycemia    Emesis / Dizzy    HPI [name] is a 68 y.o. female.  68yo F w/ PMH including IDDM, HTN who p/w cough, malaise, vomiting. PT reports 1.5 weeks of malaise, decreased appetite with poor intake, cough, intermittent nausea and vomiting, and dizziness. She has not taken her insulin in 3-4 days due to not eating. She reports some shortness of breath worse with exertion, no associated chest pain. No sore throat, abdominal pain, urinary symptoms, or sick contacts. She called EMS this morning and they noted that she became dizzy when she sat up and had an episode of vomiting. They noted bigeminy briefly that resolved after small fluid bolus.   The history is provided by the patient.    Past Medical History:  Diagnosis Date  . Diabetes mellitus without complication ([place])   . Hypertension     There are no active problems to display for this patient.   History reviewed. No pertinent surgical history.   OB History   No obstetric history on file.      Home Medications    Prior to Admission medications   Not on File    Family History Family History  Problem Relation Age of Onset  . Breast cancer Neg Hx     Social History Social History   Tobacco Use  . Smoking status: Never Smoker  . Smokeless tobacco: Never Used  Substance Use Topics  . Alcohol use: Never    Frequency: Never  . Drug use: Never     Allergies   Patient has no known allergies.   Review of Systems Review of Systems All other systems reviewed and are negative except that which was mentioned in HPI   Physical Exam Updated Vital Signs BP (!) 141/78 (BP Location: Right Arm)   Pulse 94   Temp 97.8 F (36.6 C) (Oral)   Resp (!) 22   Ht 5\' 2"  (1.575 m)   Wt 69.4 kg   SpO2 92%   BMI  27.98 kg/m   Physical Exam Vitals signs and nursing note reviewed.  Constitutional:      General: She is not in acute distress.    Appearance: She is well-developed.     Comments: uncomfortable  HENT:     Head: Normocephalic and atraumatic.     Mouth/Throat:     Mouth: Mucous membranes are dry.     Pharynx: Oropharynx is clear.  Eyes:     Conjunctiva/sclera: Conjunctivae normal.  Neck:     Musculoskeletal: Neck supple.  Cardiovascular:     Rate and Rhythm: Normal rate and regular rhythm.     Heart sounds: Normal heart sounds. No murmur.  Pulmonary:     Effort: Pulmonary effort is normal.     Comments: Diminished BS b/l Abdominal:     General: Bowel sounds are normal. There is no distension.     Palpations: Abdomen is soft.     Tenderness: There is no abdominal tenderness.  Musculoskeletal:     Right lower leg: No edema.     Left lower leg: No edema.  Skin:    General: Skin is warm and dry.  Neurological:     Mental Status: She is alert and oriented to person, place, and time.     Comments:  Fluent speech  Psychiatric:        Judgment: Judgment normal.      ED Treatments / Results  Labs (all labs ordered are listed, but only abnormal results are displayed) Labs Reviewed  CBC WITH DIFFERENTIAL/PLATELET - Abnormal; Notable for the following components:      Result Value   RBC 5.36 (*)    Hemoglobin 15.5 (*)    All other components within normal limits  CBG MONITORING, ED - Abnormal; Notable for the following components:   Glucose-Capillary 242 (*)    All other components within normal limits  COMPREHENSIVE METABOLIC PANEL  TROPONIN I  URINALYSIS, ROUTINE W REFLEX MICROSCOPIC  LIPASE, BLOOD  MAGNESIUM  BRAIN NATRIURETIC PEPTIDE    EKG EKG Interpretation  Date/Time:  Sunday August 06 2018 06:09:25 EST Ventricular Rate:  97 PR Interval:    QRS Duration: 81 QT Interval:  455 QTC Calculation: 579 R Axis:   65 Text Interpretation:  Sinus rhythm Probable  left atrial enlargement Probable anterior infarct, age indeterminate Prolonged QT interval Baseline wander in lead(s) V1 No previous ECGs available Confirmed by Theotis Burrow [phone]) on 08/06/2018 6:29:01 AM   Radiology Dg Chest 2 View  Result Date: 08/06/2018 CLINICAL DATA:  Nausea, emesis, fatigue and dizziness. EXAM: CHEST - 2 VIEW COMPARISON:  None. FINDINGS: The heart is borderline enlarged. There is mild tortuosity of the thoracic aorta. Bilateral pleural effusions and increased interstitial markings with some Kerley B lines suggesting pulmonary edema. There are also patchy areas of airspace consolidation more suspicious for superimposed pneumonia. The bony thorax is intact. IMPRESSION: 1. Cardiac enlargement, central vascular congestion, interstitial edema and pleural effusions suggesting CHF. 2. Superimposed patchy infiltrates and atelectasis. Electronically Signed   By: Marijo Sanes M.D.   On: 08/06/2018 06:53    Procedures Procedures (including critical care time)  Medications Ordered in ED Medications  furosemide (LASIX) injection 40 mg (has no administration in time range)     Initial Impression / Assessment and Plan / ED Course  I have reviewed the triage vital signs and the nursing notes.  Pertinent labs & imaging results that were available during my care of the patient were reviewed by me and considered in my medical decision making (see chart for details).     She was uncomfortable but nontoxic on exam.  O2 saturation low 90s on room air.  EKG without ischemic changes.  CBC normal.  Chest x-ray shows vascular congestion, interstitial edema, pleural effusions consistent with CHF.  I feel that her chest x-ray likely represents volume overload rather than infection.  She has no history of CHF and I feel she will require admission for diuresis and w/u.  Signing out to Dr. Regenia Skeeter pending completion of labwork.  Final Clinical Impressions(s) / ED Diagnoses   Final diagnoses:    None    ED Discharge Orders    None       Solangel Mcmanaway, Wenda Overland, MD 08/06/18 [phone]

## 2018-08-06 NOTE — Progress Notes (Signed)
RT was called due to increase work of breathing, bipap on standby. MD wants to try lasix at this time and hold off on bipap. RN to call RT is pt worsens and needs to be placed on bipap.

## 2018-08-06 NOTE — Progress Notes (Signed)
CRITICAL VALUE ALERT  Critical Value: Lactic acid  6.6  Date & Time Notied:  2/9 8:06  Provider Notified: Bodenheimer  Orders Received/Actions taken: Bolus 500 ml/3 hrs and recollect Lactic Acid levels

## 2018-08-06 NOTE — ED Notes (Signed)
Ordered diet tray for pt  

## 2018-08-06 NOTE — ED Notes (Signed)
Paged admitting per RN Deneise Lever

## 2018-08-06 NOTE — ED Notes (Signed)
Paged Admitting per RN Vicente Males

## 2018-08-06 NOTE — Progress Notes (Signed)
  Echocardiogram 2D Echocardiogram has been performed.  [name] 08/06/2018, 4:16 PM

## 2018-08-06 NOTE — ED Provider Notes (Signed)
8:15 AM Care transferred to me. Troponin 7.5. no active chest pain.  I discussed presentation with patient and she tells me that she has been feeling diffusely weak and not wanting to eat due to no appetite.  This is been ongoing for about a week.  She started off feeling like she had a cold with some mild nonproductive cough and congestion.  No fevers.  I discussed with Dr. Harl Bowie, who has reviewed ECGs and agrees to start heparin for what is likely a non-STEMI and to admit to the hospitalist service.  Labs have been reviewed and her metabolic panel shows metabolic acidosis.  Could be DKA given the glucose is over 250 though with the hypokalemia, will need to hold on insulin.  She will have her potassium repleted and will also get venous blood gas and lactic.  She has been diuresed previously.  Hospitalist, Dr. Sloan Leiter will admit.   CRITICAL CARE Performed by: Ephraim Hamburger   Total critical care time: 35 minutes  Critical care time was exclusive of separately billable procedures and treating other patients.  Critical care was necessary to treat or prevent imminent or life-threatening deterioration.  Critical care was time spent personally by me on the following activities: development of treatment plan with patient and/or surrogate as well as nursing, discussions with consultants, evaluation of patient's response to treatment, examination of patient, obtaining history from patient or surrogate, ordering and performing treatments and interventions, ordering and review of laboratory studies, ordering and review of radiographic studies, pulse oximetry and re-evaluation of patient's condition.    EKG Interpretation  Date/Time:  Sunday August 06 2018 08:06:55 EST Ventricular Rate:  95 PR Interval:    QRS Duration: 86 QT Interval:  424 QTC Calculation: 534 R Axis:   56 Text Interpretation:  Sinus rhythm Multiform ventricular premature complexes Probable left atrial enlargement Probable  anterior infarct, age indeterminate Prolonged QT interval besides PVCs, appears similar to earlier in the day Confirmed by Sherwood Gambler [phone]) on 08/06/2018 9:18:42 AM         Results for orders placed or performed during the hospital encounter of 08/06/18  CBC with Differential  Result Value Ref Range   WBC 7.6 4.0 - 10.5 K/uL   RBC 5.36 (H) 3.87 - 5.11 MIL/uL   Hemoglobin 15.5 (H) 12.0 - 15.0 g/dL   HCT 46.0 36.0 - 46.0 %   MCV 85.8 80.0 - 100.0 fL   MCH 28.9 26.0 - 34.0 pg   MCHC 33.7 30.0 - 36.0 g/dL   RDW 13.4 11.5 - 15.5 %   Platelets 325 150 - 400 K/uL   nRBC 0.0 0.0 - 0.2 %   Neutrophils Relative % 64 %   Neutro Abs 4.8 1.7 - 7.7 K/uL   Lymphocytes Relative 23 %   Lymphs Abs 1.8 0.7 - 4.0 K/uL   Monocytes Relative 11 %   Monocytes Absolute 0.8 0.1 - 1.0 K/uL   Eosinophils Relative 2 %   Eosinophils Absolute 0.1 0.0 - 0.5 K/uL   Basophils Relative 0 %   Basophils Absolute 0.0 0.0 - 0.1 K/uL   Immature Granulocytes 0 %   Abs Immature Granulocytes 0.03 0.00 - 0.07 K/uL  Troponin I - Once  Result Value Ref Range   Troponin I 7.53 (HH) <0.03 ng/mL  Lipase, blood  Result Value Ref Range   Lipase 19 11 - 51 U/L  Magnesium  Result Value Ref Range   Magnesium 1.6 (L) 1.7 - 2.4 mg/dL  CBG  monitoring, ED  Result Value Ref Range   Glucose-Capillary 242 (H) 70 - 99 mg/dL   Dg Chest 2 View  Result Date: 08/06/2018 CLINICAL DATA:  Nausea, emesis, fatigue and dizziness. EXAM: CHEST - 2 VIEW COMPARISON:  None. FINDINGS: The heart is borderline enlarged. There is mild tortuosity of the thoracic aorta. Bilateral pleural effusions and increased interstitial markings with some Kerley B lines suggesting pulmonary edema. There are also patchy areas of airspace consolidation more suspicious for superimposed pneumonia. The bony thorax is intact. IMPRESSION: 1. Cardiac enlargement, central vascular congestion, interstitial edema and pleural effusions suggesting CHF. 2. Superimposed  patchy infiltrates and atelectasis. Electronically Signed   By: Marijo Sanes M.D.   On: 08/06/2018 06:53      Sherwood Gambler, MD 08/06/18 [phone]

## 2018-08-06 NOTE — ED Notes (Signed)
Patient transported to X-ray 

## 2018-08-07 ENCOUNTER — Encounter (HOSPITAL_COMMUNITY): Payer: Self-pay | Admitting: *Deleted

## 2018-08-07 ENCOUNTER — Encounter (HOSPITAL_COMMUNITY)
Admission: EM | Disposition: A | Discharge status: Home Health | Payer: Self-pay | Source: Home / Self Care | Attending: Pulmonary Disease

## 2018-08-07 ENCOUNTER — Inpatient Hospital Stay (HOSPITAL_COMMUNITY): Payer: Medicare HMO

## 2018-08-07 DIAGNOSIS — J9602 Acute respiratory failure with hypercapnia: Secondary | ICD-10-CM

## 2018-08-07 DIAGNOSIS — I214 Non-ST elevation (NSTEMI) myocardial infarction: Principal | ICD-10-CM

## 2018-08-07 DIAGNOSIS — J9601 Acute respiratory failure with hypoxia: Secondary | ICD-10-CM

## 2018-08-07 DIAGNOSIS — J810 Acute pulmonary edema: Secondary | ICD-10-CM

## 2018-08-07 LAB — RESPIRATORY PANEL BY PCR

## 2018-08-07 LAB — BLOOD GAS, ARTERIAL
Acid-base deficit: 4.4 mmol/L — ABNORMAL HIGH (ref 0.0–2.0)
Bicarbonate: 20.8 mmol/L (ref 20.0–28.0)
Drawn by: 51133
O2 Content: 5 L/min
O2 Saturation: 86.1 %
Patient temperature: 98.6
pCO2 arterial: 42.8 mmHg (ref 32.0–48.0)
pH, Arterial: 7.308 — ABNORMAL LOW (ref 7.350–7.450)
pO2, Arterial: 61.4 mmHg — ABNORMAL LOW (ref 83.0–108.0)

## 2018-08-07 LAB — POCT I-STAT 7, (LYTES, BLD GAS, ICA,H+H)
Acid-Base Excess: 2 mmol/L (ref 0.0–2.0)
Acid-base deficit: 6 mmol/L — ABNORMAL HIGH (ref 0.0–2.0)
Bicarbonate: 24.2 mmol/L (ref 20.0–28.0)
Bicarbonate: 26.2 mmol/L (ref 20.0–28.0)
Calcium, Ion: 1.09 mmol/L — ABNORMAL LOW (ref 1.15–1.40)
Calcium, Ion: 0.99 mmol/L — ABNORMAL LOW (ref 1.15–1.40)
HCT: 43 % (ref 36.0–46.0)
HCT: 35 % — ABNORMAL LOW (ref 36.0–46.0)
Hemoglobin: 14.6 g/dL (ref 12.0–15.0)
Hemoglobin: 11.9 g/dL — ABNORMAL LOW (ref 12.0–15.0)
O2 Saturation: 77 %
O2 Saturation: 100 %
PCO2 ART: 67.7 mmHg — AB (ref 32.0–48.0)
Patient temperature: 91.9
Potassium: 4.5 mmol/L (ref 3.5–5.1)
Potassium: 3.7 mmol/L (ref 3.5–5.1)
Sodium: 129 mmol/L — ABNORMAL LOW (ref 135–145)
Sodium: 131 mmol/L — ABNORMAL LOW (ref 135–145)
TCO2: 26 mmol/L (ref 22–32)
TCO2: 27 mmol/L (ref 22–32)
pCO2 arterial: 34.4 mmHg (ref 32.0–48.0)
pH, Arterial: 7.161 — CL (ref 7.350–7.450)
pH, Arterial: 7.474 — ABNORMAL HIGH (ref 7.350–7.450)
pO2, Arterial: 54 mmHg — ABNORMAL LOW (ref 83.0–108.0)
pO2, Arterial: 174 mmHg — ABNORMAL HIGH (ref 83.0–108.0)

## 2018-08-07 LAB — COMPREHENSIVE METABOLIC PANEL
ALT: 15 U/L (ref 0–44)
AST: 24 U/L (ref 15–41)
Albumin: 2.6 g/dL — ABNORMAL LOW (ref 3.5–5.0)
Alkaline Phosphatase: 30 U/L — ABNORMAL LOW (ref 38–126)
Anion gap: 18 — ABNORMAL HIGH (ref 5–15)
BUN: 13 mg/dL (ref 8–23)
CO2: 20 mmol/L — ABNORMAL LOW (ref 22–32)
Calcium: 7.9 mg/dL — ABNORMAL LOW (ref 8.9–10.3)
Chloride: 92 mmol/L — ABNORMAL LOW (ref 98–111)
Creatinine, Ser: 1.16 mg/dL — ABNORMAL HIGH (ref 0.44–1.00)
GFR calc Af Amer: 56 mL/min — ABNORMAL LOW (ref >60)
GFR calc non Af Amer: 48 mL/min — ABNORMAL LOW (ref >60)
Glucose, Bld: 357 mg/dL — ABNORMAL HIGH (ref 70–99)
Potassium: 3.6 mmol/L (ref 3.5–5.1)
Sodium: 130 mmol/L — ABNORMAL LOW (ref 135–145)
Total Bilirubin: 0.8 mg/dL (ref 0.3–1.2)
Total Protein: 7.5 g/dL (ref 6.5–8.1)

## 2018-08-07 LAB — LACTIC ACID, PLASMA
Lactic Acid, Venous: 4.3 mmol/L (ref 0.5–1.9)
Lactic Acid, Venous: 5.7 mmol/L (ref 0.5–1.9)

## 2018-08-07 LAB — PHOSPHORUS: Phosphorus: 3.5 mg/dL (ref 2.5–4.6)

## 2018-08-07 LAB — LIPID PANEL
Cholesterol: 177 mg/dL (ref 0–200)
HDL: 47 mg/dL (ref >40)
LDL Cholesterol: 106 mg/dL — ABNORMAL HIGH (ref 0–99)
TRIGLYCERIDES: 122 mg/dL (ref <150)
Total CHOL/HDL Ratio: 3.8 RATIO
VLDL: 24 mg/dL (ref 0–40)

## 2018-08-07 LAB — CBC
HCT: 45.9 % (ref 36.0–46.0)
Hemoglobin: 14.9 g/dL (ref 12.0–15.0)
MCH: 28.3 pg (ref 26.0–34.0)
MCHC: 32.5 g/dL (ref 30.0–36.0)
MCV: 87.1 fL (ref 80.0–100.0)
PLATELETS: 315 10*3/uL (ref 150–400)
RBC: 5.27 MIL/uL — ABNORMAL HIGH (ref 3.87–5.11)
RDW: 13.5 % (ref 11.5–15.5)
WBC: 13.1 10*3/uL — ABNORMAL HIGH (ref 4.0–10.5)
nRBC: 0 % (ref 0.0–0.2)

## 2018-08-07 LAB — MAGNESIUM: Magnesium: 1.5 mg/dL — ABNORMAL LOW (ref 1.7–2.4)

## 2018-08-07 LAB — GLUCOSE, CAPILLARY
GLUCOSE-CAPILLARY: 297 mg/dL — AB (ref 70–99)
Glucose-Capillary: 258 mg/dL — ABNORMAL HIGH (ref 70–99)
Glucose-Capillary: 316 mg/dL — ABNORMAL HIGH (ref 70–99)
Glucose-Capillary: 149 mg/dL — ABNORMAL HIGH (ref 70–99)
Glucose-Capillary: 50 mg/dL — ABNORMAL LOW (ref 70–99)
Glucose-Capillary: 134 mg/dL — ABNORMAL HIGH (ref 70–99)

## 2018-08-07 LAB — HEPARIN LEVEL (UNFRACTIONATED)
Heparin Unfractionated: 0.91 IU/mL — ABNORMAL HIGH (ref 0.30–0.70)
Heparin Unfractionated: 0.14 IU/mL — ABNORMAL LOW (ref 0.30–0.70)

## 2018-08-07 LAB — MRSA PCR SCREENING: MRSA by PCR: NEGATIVE

## 2018-08-07 LAB — HIV ANTIBODY (ROUTINE TESTING W REFLEX): HIV Screen 4th Generation wRfx: NONREACTIVE

## 2018-08-07 SURGERY — INVASIVE LAB ABORTED CASE

## 2018-08-07 MED ORDER — DEXTROSE 50 % IV SOLN
50.0000 mL | Freq: Once | INTRAVENOUS | Status: AC
  Administered 2018-08-07: 50 mL via INTRAVENOUS

## 2018-08-07 MED ORDER — EPINEPHRINE PF 1 MG/10ML IJ SOSY
PREFILLED_SYRINGE | INTRAMUSCULAR | Status: AC
  Filled 2018-08-07: qty 10

## 2018-08-07 MED ORDER — ATORVASTATIN CALCIUM 40 MG PO TABS
40.0000 mg | ORAL_TABLET | Freq: Every day | ORAL | Status: DC
  Administered 2018-08-07 – 2018-08-14 (×7): 40 mg
  Filled 2018-08-07 (×7): qty 1

## 2018-08-07 MED ORDER — ROCURONIUM BROMIDE 50 MG/5ML IV SOLN
50.0000 mg | Freq: Once | INTRAVENOUS | Status: AC
  Administered 2018-08-07: 50 mg via INTRAVENOUS
  Filled 2018-08-07: qty 5

## 2018-08-07 MED ORDER — ETOMIDATE 2 MG/ML IV SOLN
20.0000 mg | Freq: Once | INTRAVENOUS | Status: AC
  Administered 2018-08-07: 20 mg via INTRAVENOUS

## 2018-08-07 MED ORDER — FUROSEMIDE 10 MG/ML IJ SOLN
40.0000 mg | Freq: Once | INTRAMUSCULAR | Status: AC
  Administered 2018-08-07: 40 mg via INTRAVENOUS
  Filled 2018-08-07: qty 4

## 2018-08-07 MED ORDER — ORAL CARE MOUTH RINSE
15.0000 mL | OROMUCOSAL | Status: DC
  Administered 2018-08-07 – 2018-08-11 (×34): 15 mL via OROMUCOSAL

## 2018-08-07 MED ORDER — DEXTROSE 50 % IV SOLN
INTRAVENOUS | Status: AC
  Administered 2018-08-07: 50 mL via INTRAVENOUS
  Filled 2018-08-07: qty 50

## 2018-08-07 MED ORDER — POTASSIUM CHLORIDE 20 MEQ/15ML (10%) PO SOLN
40.0000 meq | Freq: Two times a day (BID) | ORAL | Status: AC
  Administered 2018-08-07 (×2): 40 meq via ORAL
  Filled 2018-08-07 (×2): qty 30

## 2018-08-07 MED ORDER — BISACODYL 10 MG RE SUPP: 10.0000 mg | Freq: Every day | RECTAL | Status: DC | PRN

## 2018-08-07 MED ORDER — SODIUM CHLORIDE 0.9% FLUSH: 3.0000 mL | INTRAVENOUS | Status: DC | PRN

## 2018-08-07 MED ORDER — PHENYLEPHRINE HCL-NACL 10-0.9 MG/250ML-% IV SOLN
0.0000 ug/min | INTRAVENOUS | Status: DC
  Administered 2018-08-07 (×2): 55 ug/min via INTRAVENOUS
  Administered 2018-08-07: 20 ug/min via INTRAVENOUS
  Administered 2018-08-08 (×2): 50 ug/min via INTRAVENOUS
  Administered 2018-08-08: 25 ug/min via INTRAVENOUS
  Administered 2018-08-08: 30 ug/min via INTRAVENOUS
  Administered 2018-08-08: 55 ug/min via INTRAVENOUS
  Administered 2018-08-09: 25 ug/min via INTRAVENOUS
  Filled 2018-08-07 (×8): qty 250

## 2018-08-07 MED ORDER — SODIUM CHLORIDE 0.9% FLUSH: 3.0000 mL | Freq: Two times a day (BID) | INTRAVENOUS | Status: DC

## 2018-08-07 MED ORDER — SODIUM BICARBONATE 8.4 % IV SOLN
50.0000 meq | Freq: Once | INTRAVENOUS | Status: AC
  Administered 2018-08-07: 50 meq via INTRAVENOUS

## 2018-08-07 MED ORDER — VITAL HIGH PROTEIN PO LIQD
1000.0000 mL | ORAL | Status: DC
  Administered 2018-08-07 – 2018-08-10 (×4): 1000 mL

## 2018-08-07 MED ORDER — ASPIRIN 81 MG PO CHEW
81.0000 mg | CHEWABLE_TABLET | ORAL | Status: AC
  Administered 2018-08-07: 81 mg via ORAL
  Filled 2018-08-07: qty 1

## 2018-08-07 MED ORDER — INSULIN ASPART 100 UNIT/ML ~~LOC~~ SOLN
0.0000 [IU] | SUBCUTANEOUS | Status: DC
  Administered 2018-08-07: 11 [IU] via SUBCUTANEOUS
  Administered 2018-08-07 – 2018-08-08 (×3): 2 [IU] via SUBCUTANEOUS
  Administered 2018-08-08 – 2018-08-09 (×2): 3 [IU] via SUBCUTANEOUS
  Administered 2018-08-09: 2 [IU] via SUBCUTANEOUS
  Administered 2018-08-09: 3 [IU] via SUBCUTANEOUS
  Administered 2018-08-09: 2 [IU] via SUBCUTANEOUS
  Administered 2018-08-09: 3 [IU] via SUBCUTANEOUS
  Administered 2018-08-09: 8 [IU] via SUBCUTANEOUS
  Administered 2018-08-10: 2 [IU] via SUBCUTANEOUS
  Administered 2018-08-10: 3 [IU] via SUBCUTANEOUS
  Administered 2018-08-10: 5 [IU] via SUBCUTANEOUS
  Administered 2018-08-10: 3 [IU] via SUBCUTANEOUS
  Administered 2018-08-11: 8 [IU] via SUBCUTANEOUS
  Administered 2018-08-11: 5 [IU] via SUBCUTANEOUS

## 2018-08-07 MED ORDER — ADULT MULTIVITAMIN LIQUID CH
15.0000 mL | Freq: Every day | ORAL | Status: DC
  Administered 2018-08-07 – 2018-08-09 (×2): 15 mL via ORAL
  Filled 2018-08-07 (×4): qty 15

## 2018-08-07 MED ORDER — PANTOPRAZOLE SODIUM 40 MG IV SOLR
40.0000 mg | INTRAVENOUS | Status: DC
  Administered 2018-08-07 – 2018-08-13 (×7): 40 mg via INTRAVENOUS
  Filled 2018-08-07 (×7): qty 40

## 2018-08-07 MED ORDER — EPINEPHRINE PF 1 MG/10ML IJ SOSY
PREFILLED_SYRINGE | INTRAMUSCULAR | Status: DC | PRN
  Administered 2018-08-07: 1 mg via INTRAVENOUS

## 2018-08-07 MED ORDER — SODIUM BICARBONATE 8.4 % IV SOLN
INTRAVENOUS | Status: AC
  Filled 2018-08-07: qty 50

## 2018-08-07 MED ORDER — MIDAZOLAM HCL 2 MG/2ML IJ SOLN
1.0000 mg | INTRAMUSCULAR | Status: DC | PRN
  Filled 2018-08-07 (×4): qty 2

## 2018-08-07 MED ORDER — CALCIUM GLUCONATE-NACL 2-0.675 GM/100ML-% IV SOLN
2.0000 g | Freq: Once | INTRAVENOUS | Status: AC
  Administered 2018-08-07: 2000 mg via INTRAVENOUS
  Filled 2018-08-07: qty 100

## 2018-08-07 MED ORDER — DOCUSATE SODIUM 50 MG/5ML PO LIQD
100.0000 mg | Freq: Two times a day (BID) | ORAL | Status: DC | PRN
  Administered 2018-08-11: 100 mg
  Filled 2018-08-07: qty 10

## 2018-08-07 MED ORDER — SODIUM CHLORIDE 0.9 % IV SOLN: 250.0000 mL | INTRAVENOUS | Status: DC | PRN

## 2018-08-07 MED ORDER — MIDAZOLAM HCL 2 MG/2ML IJ SOLN
2.0000 mg | Freq: Once | INTRAMUSCULAR | Status: AC
  Administered 2018-08-07: 2 mg via INTRAVENOUS

## 2018-08-07 MED ORDER — ASPIRIN 81 MG PO CHEW: 81.0000 mg | CHEWABLE_TABLET | ORAL | Status: DC

## 2018-08-07 MED ORDER — DEXTROSE 50 % IV SOLN: 25.0000 mL | Freq: Once | INTRAVENOUS | Status: DC

## 2018-08-07 MED ORDER — SODIUM CHLORIDE 0.9 % IV BOLUS
500.0000 mL | Freq: Once | INTRAVENOUS | Status: AC
  Administered 2018-08-07: 500 mL via INTRAVENOUS

## 2018-08-07 MED ORDER — CHLORHEXIDINE GLUCONATE 0.12% ORAL RINSE (MEDLINE KIT)
15.0000 mL | Freq: Two times a day (BID) | OROMUCOSAL | Status: DC
  Administered 2018-08-07 – 2018-08-12 (×9): 15 mL via OROMUCOSAL

## 2018-08-07 MED ORDER — LIDOCAINE HCL (PF) 1 % IJ SOLN
INTRAMUSCULAR | Status: AC
  Filled 2018-08-07: qty 30

## 2018-08-07 MED ORDER — FENTANYL CITRATE (PF) 100 MCG/2ML IJ SOLN
100.0000 ug | Freq: Once | INTRAMUSCULAR | Status: AC
  Administered 2018-08-07: 100 ug via INTRAVENOUS

## 2018-08-07 MED ORDER — SODIUM CHLORIDE 0.9 % IV SOLN
INTRAVENOUS | Status: DC
  Administered 2018-08-07: 06:00:00 via INTRAVENOUS

## 2018-08-07 MED ORDER — FENTANYL CITRATE (PF) 100 MCG/2ML IJ SOLN
50.0000 ug | INTRAMUSCULAR | Status: DC | PRN
  Filled 2018-08-07 (×2): qty 2

## 2018-08-07 MED ORDER — FENTANYL CITRATE (PF) 100 MCG/2ML IJ SOLN
50.0000 ug | INTRAMUSCULAR | Status: DC | PRN
  Administered 2018-08-07 – 2018-08-11 (×13): 50 ug via INTRAVENOUS
  Filled 2018-08-07 (×11): qty 2

## 2018-08-07 MED ORDER — ASPIRIN 81 MG PO CHEW
81.0000 mg | CHEWABLE_TABLET | Freq: Every day | ORAL | Status: DC
  Administered 2018-08-07: 81 mg
  Filled 2018-08-07: qty 1

## 2018-08-07 MED ORDER — MIDAZOLAM HCL 2 MG/2ML IJ SOLN
1.0000 mg | INTRAMUSCULAR | Status: DC | PRN
  Administered 2018-08-08 – 2018-08-09 (×8): 1 mg via INTRAVENOUS
  Filled 2018-08-07 (×4): qty 2

## 2018-08-07 SURGICAL SUPPLY — 5 items
ELECT DEFIB PAD ADLT CADENCE (PAD) ×4 IMPLANT
KIT HEART LEFT (KITS) ×4 IMPLANT
PACK CARDIAC CATHETERIZATION (CUSTOM PROCEDURE TRAY) ×4 IMPLANT
TRANSDUCER W/STOPCOCK (MISCELLANEOUS) ×4 IMPLANT
TUBING CIL FLEX 10 FLL-RA (TUBING) ×4 IMPLANT

## 2018-08-07 NOTE — Progress Notes (Signed)
Critical Care note:  Ms. [name] is a 68 year old African-American female who was scheduled to undergo cardiac catheterization this a.m.  She has a long history of greater than 50 years of tobacco use, history of diabetes mellitus on insulin, hypertension, had experienced approximately 2 weeks of shortness of breath fatigue and poor appetite.  She had experienced chest discomfort the night prior to yesterday and yesterday morning presented with shortness of breath, nausea and vomiting and had significant troponin elevation at 7.5.  Chest x-ray raised the possibility of pulmonary edema.  She was treated with Lasix IV x2.  BiPAP was recommended last evening but the patient refused.  Color Doppler study showed severely reduced systolic function of 20 to 25% and RVSP at 46.8.  Patient was on oxygen in the waiting area this morning at 6 L with  98% oxygen saturation.  This was discontinued from the holding area and the patient was brought into the Cath Lab for planned cardiac catheterization today.  She had transitioned from her stretcher onto the table and was in the process of placing her on oxygen when she developed agonal breathing with sigh and then sustained a PEA cardiac arrest.  CPR was instituted.  She was given epinephrine 1 amp.  CPR persisted for approximately 6 to 8 minutes and ultimately she came to and was hypertensive to 180/102 after epinephrine with palpable pulse. No ST elevation was noted post event. Anesthesia was immediately called.  With 100% oxygen, oxygenation improved but there was still labored breathing.  I obtained a femoral artery blood gas which revealed pH 7.16, PCO2 67, PO2 54, base excess -6 and O2 saturation 77%.  Chart was reviewed.  Pulmonary critical care was notified and  she was seen by Dr. Elsworth Soho and colleague.  This time, the decision was made to abort the cardiac catheterization procedure which had not yet started.  The patient will be transferred to to heart and  anticipate she will need to be intubated due to continued breathing despite 100% oxygen.  Anticipate a component of pulmonary edema and underlying lung disease.  Patient ultimately will require right and left heart cardiac catheterization once stable.  CC time: 35 minutes  Troy Sine, MD, [facility]

## 2018-08-07 NOTE — Progress Notes (Signed)
Inpatient Diabetes Program Recommendations  AACE/ADA: New Consensus Statement on Inpatient Glycemic Control (2015)  Target Ranges:  Prepandial:   less than 140 mg/dL      Peak postprandial:   less than 180 mg/dL (1-2 hours)      Critically ill patients:  140 - 180 mg/dL   Lab Results  Component Value Date   GLUCAP 258 (H) 08/07/2018   HGBA1C 10.2 (H) 08/06/2018    Review of Glycemic Control  Diabetes history: DM2 Outpatient Diabetes medications: Toujeo 50 units daily + Metformin 1 gm bid Current orders for Inpatient glycemic control: Novolog moderate correction scale q 4 hrs.  Inpatient Diabetes Program Recommendations:   Noted patient currently on ventilator. Please consider ICU Adult Glycemic control order set  Thank you, Nani Gasser. Layna Roeper, RN, MSN, CDE  Diabetes Coordinator Inpatient Glycemic Control Team Team Pager [phone] (8am-5pm) 08/07/2018 12:27 PM

## 2018-08-07 NOTE — Progress Notes (Signed)
Paged by bedside RN in regards to pt's ongoing SOB and tachypnea. Pt had become SOB early in the night with a CXR showing some pulmonary edema. She received 20mg  of Lasix IV and a PRN Duoneb. Work of breathing improved but than began to worsen again. An ABG was drawn with the results below. She refused BiPAP and was placed on 10L HiFlo [place]. She was given an additional 40mg  of Lasix IV as well.   Upon entering the room pt is resting with her eyes closed. She is tachypneic with a RR of 24 and some slight accessory muscle use. LS: Wheezes bilaterally with fine crackles in the bases.  She aroused to voice and is oriented x 4. She states that she is SOB but feels it has improved. She refuses BiPAP when I offered it to her and explained the benefits of it. O2 sats were 99% on 10L while I was present. We will continue the current course of treatment attempting to diurese her while decreasing her oxygen requirements. No questions or concerns from pt or family.   Results for HAYLYNN, PHA (MRN [id number]) as of 08/07/2018 01:26  Ref. Range 08/07/2018 00:08  Sample type Unknown ARTERIAL DRAW  Delivery systems Unknown NASAL CANNULA  O2 Content Latest Units: L/min 5.0  pH, Arterial Latest Ref Range: 7.350 - 7.450  7.308 (L)  pCO2 arterial Latest Ref Range: 32.0 - 48.0 mmHg 42.8  pO2, Arterial Latest Ref Range: 83.0 - 108.0 mmHg 61.4 (L)  Acid-base deficit Latest Ref Range: 0.0 - 2.0 mmol/L 4.4 (H)  Bicarbonate Latest Ref Range: 20.0 - 28.0 mmol/L 20.8  O2 Saturation Latest Units: % 86.1    Elo Marmolejos AGPCNP-BC, AGNP-C [facility] Pager [phone]

## 2018-08-07 NOTE — Procedures (Signed)
Intubation Procedure Note [name] [id number] June 23, 1951  Procedure: Intubation Indications: Respiratory insufficiency  Procedure Details Consent: Unable to obtain consent because of emergent medical necessity. Time Out: Verified patient identification, verified procedure, site/side was marked, verified correct patient position, special equipment/implants available, medications/allergies/relevent history reviewed, required imaging and test results available.  Performed  MAC and 3 Medications:  Fentanyl  Etomidate Versed NMB    Evaluation Hemodynamic Status: Transient hypotension treated with pressors and treated with fluid; O2 sats: stable throughout Patient's Current Condition: stable Complications: No apparent complications Patient did tolerate procedure well. Chest X-ray ordered to verify placement.  OYW:VXUC et tube back 2 cm   Richardson Landry Minor ACNP Maryanna Shape PCCM Pager [phone] till 3 pm If no answer page [phone] 08/07/2018, 9:50 AM

## 2018-08-07 NOTE — Progress Notes (Signed)
RN called RT stating pt's breathing was not any better. RT came to place pt on bipap, pt was unable to tolerate bipap. RN aware.

## 2018-08-07 NOTE — Progress Notes (Signed)
ANTICOAGULATION CONSULT NOTE - Follow Up Consult  Pharmacy Consult for IV heparin Indication: NSTEMI  No Known Allergies  Patient Measurements: Height: 5\' 2"  (157.5 cm) Weight: 141 lb 4.8 oz (64.1 kg) IBW/kg (Calculated) : 50.1 Heparin Dosing Weight: 62.8 kg  Vital Signs: Temp: 98.1 F (36.7 C) (02/10 2040) Temp Source: Esophageal (02/10 1600) BP: 115/78 (02/10 2040) Pulse Rate: 97 (02/10 2040)  Labs: Recent Labs    08/06/18 0623 08/06/18 0628  08/06/18 1148 08/06/18 1321 08/06/18 1714 08/07/18 0239 08/07/18 0912 08/07/18 1016 08/07/18 2037  HGB  --  15.5*   < >  --   --   --  14.9 14.6 11.9*  --   HCT  --  46.0   < >  --   --   --  45.9 43.0 35.0*  --   PLT  --  325  --   --   --   --  315  --   --   --   HEPARINUNFRC  --   --   --   --   --  0.92* 0.91*  --   --  0.14*  CREATININE 1.11*  --   --   --   --   --  1.16*  --   --   --   TROPONINI  --  7.53*  --  6.55* 6.29*  --   --   --   --   --    < > = values in this interval not displayed.    Estimated Creatinine Clearance: 40.2 mL/min (A) (by C-G formula based on SCr of 1.16 mg/dL (H)).   Medications:  Infusions:  . sodium chloride Stopped (08/07/18 1831)  . heparin 600 Units/hr (08/07/18 1900)  . phenylephrine (NEO-SYNEPHRINE) Adult infusion 55 mcg/min (08/07/18 2013)    Assessment: 68 yo female on IV Heparin s/p PEA arrest, NSTEMI.  Heparin level this evening below goal range.  Hgb down to 11.0, Pltc stable.  No overt bleeding or complications noted.  Goal of Therapy:  Heparin level 0.3-0.7 units/ml Monitor platelets by anticoagulation protocol: Yes   Plan:  1. Increase IV Heparin to 750 units/hr. 2. Recheck heparin level in 8 hrs. 3. Daily heparin level and CBC. 4. F/u plans for cardiac intervention.  Marguerite Olea, [facility] Clinical Pharmacist Phone [phone]  08/07/2018 9:43 PM

## 2018-08-07 NOTE — Progress Notes (Signed)
ANTICOAGULATION CONSULT NOTE - Follow Up Consult  Pharmacy Consult for heparin Indication: NSTEMI  Labs: Recent Labs    08/06/18 0623  08/06/18 0628 08/06/18 0940 08/06/18 1148 08/06/18 1321 08/06/18 1714 08/07/18 0239  HGB  --    < > 15.5* 15.3*  --   --   --  14.9  HCT  --   --  46.0 45.0  --   --   --  45.9  PLT  --   --  325  --   --   --   --  315  HEPARINUNFRC  --   --   --   --   --   --  0.92* 0.91*  CREATININE 1.11*  --   --   --   --   --   --   --   TROPONINI  --   --  7.53*  --  6.55* 6.29*  --   --    < > = values in this interval not displayed.    Assessment: 68yo female remains supratherapeutic on heparin despite rate decrease; no gtt issues or signs of bleeding per RN.  Goal of Therapy:  Heparin level 0.3-0.7 units/ml   Plan:  Will decrease heparin gtt by 2-3 units/kg/hr to 600 units/hr and check level in 8 hours.    Wynona Neat, PharmD, BCPS  08/07/2018,3:19 AM

## 2018-08-07 NOTE — Consult Note (Signed)
68 year old never smoker, hypertensive diabetic admitted 2/9 with 10 days of cough, malaise and noted to have bilateral infiltrates in the pattern of edema versus pneumonia, no preceding febrile illness, WBC count was normal.  Troponin was 7.5, cardiology consulted with the presumptive diagnosis of NSTEMI.  She required 10 L of oxygen and did not tolerate BiPAP overnight.  ABG shows severe hypoxia with mild metabolic acidosis. We were emergently called to the Cath Lab after PEA arrest, she was transferred to the Table and was off her oxygen, ROSC was achieved within 4 minutes of CPR and 1 round of epinephrine.  She had acute respiratory acidosis and was transferred to the ICU and intubated  Chest x-ray post intubation personally reviewed which shows ET tube at the carina and bilateral airspace disease again in the pattern of edema versus pneumonia  On exam-elderly, chronically ill-appearing, mild distress, follows commands, S1-S2 normal, bilateral scattered crackles and faint rhonchi, soft nontender abdomen, cool extremities.  Labs show severe hypokalemia, high glucose, low ionized calcium and magnesium, high BNP, lactate decreased to 2.2  Echo shows EF of 25% with global hypokinesis  ImPression/plan  Acute pulmonary edema /new cardiomyopathy / NSTEMI -will need cardiac cath eventually, will diurese as blood pressure permits  PEA arrest-likely related to severe hypoxia and poor ventilation  Acute hypoxic and hypercarbic respiratory failure-ventilator settings reviewed and adjusted ABG shows severe respiratory and metabolic alkalosis, will dial down respiratory rate   Hypocalcemia and hypomagnesemia will be repleted  Obtain respiratory viral panel for completion since history is not clear  The patient is critically ill with multiple organ systems failure and requires high complexity decision making for assessment and support, frequent evaluation and titration of therapies, application of  advanced monitoring technologies and extensive interpretation of multiple databases. Critical Care Time devoted to patient care services described in this note independent of APP/resident  time is 60 minutes.   Leanna Sato Elsworth Soho MD

## 2018-08-07 NOTE — Progress Notes (Signed)
Hypoglycemic Event  CBG: 50  Treatment: D50 25g IV given per Hypoglycemia Protocol   Follow-up CBG: Time: 2116 CBG Result: 134  Comments/MD notified: N/A    Darla Lesches

## 2018-08-07 NOTE — Progress Notes (Signed)
RT note- Patient was intubated upon arrival to [facility] with 7.5 and secured at 23 and now post Xray withdrawn 2 cm to 21.

## 2018-08-07 NOTE — Progress Notes (Signed)
RT note-ventilator rate and fio2 changed post ABG.

## 2018-08-07 NOTE — Consult Note (Signed)
NAME:  [name], MRN:  [id number], DOB:  05/11/1951, LOS: 1 ADMISSION DATE:  08/06/2018,  CONSULTATION DATE: 08/07/2018 REFERRING MD: Cardiology, CHIEF COMPLAINT: Respiratory failure status post PEA cardiac arrest  Brief History   68 year old female with coronary artery disease status post PEA arrest on 08/07/2018  History of present illness   68 year old female with history of coronary disease, lifelong smoker, diabetes mellitus who was admitted 08/06/2018 with a non-ST elevation MI.  He was initially mated to the [facility].  She was scheduled for left heart catheterization on 08/07/2018.  While being placed on table she had a PEA arrest that lasted for 4 minutes of CPR and 1 round of epinephrine.  He had return of spontaneous circulation was awake alert but looked in acute discomfort.  She was not mentating well was not moving oxygen.  She had a profound acidosis and was transferred to the heart unit where she was urgently intubated and placed on full mechanical ventilatory support.  Heart catheterization has been postponed at this time.  Her CBG was noted to be greater than 200 in the Cath Lab.  Pulmonary critical care is asked to assume her care at this time.  Past Medical History  Coronary artery disease Hypertension Diabetes Tobacco abuse  Significant Hospital Events   08/07/2018 PEA arrest CPR for 4 minutes 1 round of epi with return of spontaneous circulation  Consults:  08/06/2008 cardiology 08/07/2008 pulmonary critical care  Procedures:  08/07/2018 intubation>>  Significant Diagnostic Tests:    Micro Data:  None  Antimicrobials:  None  Interim history/subjective:  [age]-year-old female admitted with chest pain nausea vomiting who had a PEA arrest on the cath table 08/07/2018.  Taken to HTN and intubated per PCCM.  Objective   Blood pressure 120/81, pulse 85, temperature 98 F (36.7 C), temperature source Oral, resp. rate (!) 42, height 5\' 2"  (1.575 m), weight 64.1 kg,  SpO2 98 %.    Vent Mode: PRVC FiO2 (%):  [100 %] 100 % Set Rate:  [15 bmp-24 bmp] 24 bmp Vt Set:  [400 mL] 400 mL PEEP:  [5 cmH20] 5 cmH20 Plateau Pressure:  [23 cmH20] 23 cmH20   Intake/Output Summary (Last 24 hours) at 08/07/2018 0949 Last data filed at 08/07/2018 0644 Gross per 24 hour  Intake 376.55 ml  Output 350 ml  Net 26.55 ml   Filed Weights   08/06/18 0616 08/06/18 1908 08/07/18 0625  Weight: 69.4 kg 63.3 kg 64.1 kg    Examination: General: Prior to intubation was awake follow commands appeared weak and dyspneic HENT: No JVD or lymphadenopathy is appreciated.  edentulous  Lungs: Decreased breath sounds in the bases.  Currently on full mechanical ventilatory support 8 cc/kg with a rate of 2400% FiO2 and 5 of PEEP Cardiovascular: Sounds are regular regular rhythm Abdomen: Protuberant abdomen with positive bowel sounds Extremities: Cool to touch but unable to palpate pulses Neuro: Prior to intubation awake follow commands moves all extremities   Resolved Hospital Problem list     Assessment & Plan:  Vent dependent respiratory failure secondary to proximal status post PEA cardiac arrest.  Profound acidosis proportion to PCO2 of 67.  Lifelong tobacco abuse Presumed pulmonary edema on chest x-ray Vent bundle Serial chest x-rays note endotracheal tube withdrawn 2 cm on 08/07/2018 post intubation Recheck ABG 30 minutes post intubation Respiratory rate increased to 24 Bicarb given prior to intubation We will not use nicotine patch in the setting since she is currently sedated on a  ventilator. No diuresis at this time.  Post 6 minutes PEA arrest requiring 4 minutes of CPR and 1 amp of epinephrine on 08/07/2018 while inCath Lab.  She did not have a cardiac cath at this time. Her coronary disease with recent chest pain and admission an elevated troponin 6.7 Hypotension post intubation History of hypertension Admit to the intensive care unit Full monitoring Cardiology is  following Fluid bolus for low blood pressure Neo-Synephrine drip peripherally Hold antihypertensives Continue cycle cardiac enzymes Review twelve-lead EKG Heparin drip per cards  Diabetic Sliding scale insulin   Best practice:  Diet: N.p.o. for now Pain/Anxiety/Delirium protocol (if indicated): Sedation protocol VAP protocol (if indicated): Yes DVT prophylaxis: Subcu heparin GI prophylaxis: PPI Glucose control: Sliding scale insulin protocol Mobility: Bedrest Code Status: Full Family Communication: 08/07/2018 no family currently at bedside Disposition: Transferred from cardiac Cath Lab to 2 H10 and emergently intubated  Labs   CBC: Recent Labs  Lab 08/06/18 0628 08/06/18 0940 08/07/18 0239  WBC 7.6  --  13.1*  NEUTROABS 4.8  --   --   HGB 15.5* 15.3* 14.9  HCT 46.0 45.0 45.9  MCV 85.8  --  87.1  PLT 325  --  147    Basic Metabolic Panel: Recent Labs  Lab 08/06/18 0623 08/06/18 0628 08/06/18 0940 08/06/18 1927 08/07/18 0239  NA 132*  --  127*  --  130*  K 2.6*  --  2.6* 2.6* 3.6  CL 91*  --   --   --  92*  CO2 19*  --   --   --  20*  GLUCOSE 264*  --   --   --  357*  BUN 11  --   --   --  13  CREATININE 1.11*  --   --   --  1.16*  CALCIUM 8.5*  --   --   --  7.9*  MG  --  1.6*  --   --  1.5*  PHOS  --   --   --   --  3.5   GFR: Estimated Creatinine Clearance: 40.2 mL/min (A) (by C-G formula based on SCr of 1.16 mg/dL (H)). Recent Labs  Lab 08/06/18 0628  08/06/18 1148 08/06/18 1927 08/07/18 0018 08/07/18 0239  WBC 7.6  --   --   --   --  13.1*  LATICACIDVEN  --    < > 4.1* 6.6* 4.3* 5.7*   < > = values in this interval not displayed.    Liver Function Tests: Recent Labs  Lab 08/06/18 0623 08/07/18 0239  AST 22 24  ALT 12 15  ALKPHOS 83 30*  BILITOT 1.4* 0.8  PROT 7.0 7.5  ALBUMIN 2.5* 2.6*   Recent Labs  Lab 08/06/18 0628  LIPASE 19   No results for input(s): AMMONIA in the last 168 hours.  ABG    Component Value Date/Time    PHART 7.308 (L) 08/07/2018 0008   PCO2ART 42.8 08/07/2018 0008   PO2ART 61.4 (L) 08/07/2018 0008   HCO3 20.8 08/07/2018 0008   TCO2 23 08/06/2018 0940   ACIDBASEDEF 4.4 (H) 08/07/2018 0008   O2SAT 86.1 08/07/2018 0008     Coagulation Profile: No results for input(s): INR, PROTIME in the last 168 hours.  Cardiac Enzymes: Recent Labs  Lab 08/06/18 0628 08/06/18 1148 08/06/18 1321  TROPONINI 7.53* 6.55* 6.29*    HbA1C: Hgb A1c MFr Bld  Date/Time Value Ref Range Status  08/06/2018 02:00 PM 10.2 (H) 4.8 -  5.6 % Final    Comment:    (NOTE) Pre diabetes:          5.7%-6.4% Diabetes:              >6.4% Glycemic control for   <7.0% adults with diabetes     CBG: Recent Labs  Lab 08/06/18 0623 08/06/18 1703 08/06/18 2312 08/07/18 0758 08/07/18 0853  GLUCAP 242* 372* 346* 297* 258*    Review of Systems:   na  Past Medical History  She,  has a past medical history of Diabetes mellitus without complication ([place]) and Hypertension.   Surgical History   History reviewed. No pertinent surgical history.   Social History   reports that she has never smoked. She has never used smokeless tobacco. She reports that she does not drink alcohol or use drugs.   Family History   Her family history is negative for Breast cancer.   Allergies No Known Allergies   Home Medications  Prior to Admission medications   Medication Sig Start Date End Date Taking? Authorizing Provider  hydrochlorothiazide (MICROZIDE) 12.5 MG capsule Take 12.5 mg by mouth daily.   Yes [provider]  Insulin Glargine, 2 Unit Dial, (TOUJEO MAX SOLOSTAR) 300 UNIT/ML SOPN Inject 50 Units into the skin at bedtime.   Yes [provider]  metFORMIN (GLUCOPHAGE) 1000 MG tablet Take 1,000 mg by mouth 2 (two) times daily with a meal.   Yes [provider]     Critical care time:60 min     Richardson Landry Milan Perkins ACNP Maryanna Shape PCCM Pager [phone] till 1 pm If no answer page [phone]  [phone] 08/07/2018, 9:49 AM

## 2018-08-07 NOTE — Progress Notes (Signed)
Initial Nutrition Assessment  DOCUMENTATION CODES:   Not applicable  INTERVENTION:   Tube feeding via OG tube: - Vital High Protein @ 45 ml/hr (1080 ml/day) - Liquid MVI daily  Tube feeding regimen provides 1080 kcal, 95 grams of protein, and 907 ml of H2O.  NUTRITION DIAGNOSIS:   Inadequate oral intake related to inability to eat as evidenced by NPO status.  GOAL:   Patient will meet greater than or equal to 90% of their needs  MONITOR:   Vent status, TF tolerance, Weight trends, Labs  REASON FOR ASSESSMENT:   Consult Enteral/tube feeding initiation and management, Assessment of nutrition requirement/status  ASSESSMENT:   68 year old female who presented to the ED on 2/9 with N/V. PMH significant for T2DM requiring insulin, HTN. Pt found to have NSTEMI. Pt was brought into cath lab for planned cardiac on 2/10 and experienced a PEA cardiac arrest. CPR initiated. Pt intubated on 2/10.  Discussed pt with RN.  No family present at time of RD visit so unable to obtain diet or weight history. Pt alert, nodding when RD asked about completing NFPE.  RD consulted for initiation and management of enteral nutrition. Orders placed as above.  Patient is currently intubated on ventilator support. OG tube in stomach. MV: 9.4 L/min Temp (24hrs), Avg:95.9 F (35.5 C), Min:90 F (32.2 C), Max:98.2 F (36.8 C) BP: 95/65 MAP: 76  Medications reviewed and include: SSI, Protonix, KCl 40 mEq BID, NS @ 10 ml/hr, heparin @ 6 ml/hr  Labs reviewed: sodium 130 (L), magnesium 1.5 (L) CBG's: 258, 297, 346, 372 x 24 hours  NUTRITION - FOCUSED PHYSICAL EXAM:    Most Recent Value  Orbital Region  No depletion  Upper Arm Region  No depletion  Thoracic and Lumbar Region  No depletion  Buccal Region  Unable to assess  Temple Region  Unable to assess  Clavicle Bone Region  Mild depletion  Clavicle and Acromion Bone Region  Mild depletion  Scapular Bone Region  Unable to assess  Dorsal  Hand  No depletion  Patellar Region  No depletion  Anterior Thigh Region  No depletion  Posterior Calf Region  No depletion  Edema (RD Assessment)  Mild [BLE]  Hair  Reviewed  Eyes  Reviewed  Mouth  Unable to assess  Skin  Reviewed  Nails  Reviewed       Diet Order:   Diet Order    None      EDUCATION NEEDS:   Not appropriate for education at this time  Skin:  Skin Assessment: Reviewed RN Assessment  Last BM:  2/9  Height:   Ht Readings from Last 1 Encounters:  08/06/18 5\' 2"  (1.575 m)    Weight:   Wt Readings from Last 1 Encounters:  08/07/18 64.1 kg    Ideal Body Weight:  50 kg  BMI:  Body mass index is 25.84 kg/m.  Estimated Nutritional Needs:   Kcal:  1123  Protein:  90-105 grams  Fluid:  >/= 1.5 L    Gaynell Face, MS, RD, LDN Inpatient Clinical Dietitian Pager: [phone] Weekend/After Hours: [phone]

## 2018-08-08 ENCOUNTER — Inpatient Hospital Stay (HOSPITAL_COMMUNITY): Payer: Medicare HMO

## 2018-08-08 ENCOUNTER — Other Ambulatory Visit: Payer: Self-pay

## 2018-08-08 ENCOUNTER — Encounter (HOSPITAL_COMMUNITY)
Admission: EM | Disposition: A | Discharge status: Home Health | Payer: Self-pay | Source: Home / Self Care | Attending: Pulmonary Disease

## 2018-08-08 DIAGNOSIS — R739 Hyperglycemia, unspecified: Secondary | ICD-10-CM

## 2018-08-08 DIAGNOSIS — R570 Cardiogenic shock: Secondary | ICD-10-CM

## 2018-08-08 LAB — CBC WITH DIFFERENTIAL/PLATELET
Abs Immature Granulocytes: 0.07 10*3/uL (ref 0.00–0.07)
Basophils Absolute: 0 10*3/uL (ref 0.0–0.1)
Basophils Relative: 0 %
Eosinophils Absolute: 0 10*3/uL (ref 0.0–0.5)
Eosinophils Relative: 0 %
HEMATOCRIT: 39.6 % (ref 36.0–46.0)
HEMOGLOBIN: 13.2 g/dL (ref 12.0–15.0)
Immature Granulocytes: 1 %
Lymphocytes Relative: 15 %
Lymphs Abs: 2.1 10*3/uL (ref 0.7–4.0)
MCH: 28.4 pg (ref 26.0–34.0)
MCHC: 33.3 g/dL (ref 30.0–36.0)
MCV: 85.3 fL (ref 80.0–100.0)
Monocytes Absolute: 1.1 10*3/uL — ABNORMAL HIGH (ref 0.1–1.0)
Monocytes Relative: 8 %
NRBC: 0 % (ref 0.0–0.2)
Neutro Abs: 10.8 10*3/uL — ABNORMAL HIGH (ref 1.7–7.7)
Neutrophils Relative %: 76 %
Platelets: 329 10*3/uL (ref 150–400)
RBC: 4.64 MIL/uL (ref 3.87–5.11)
RDW: 13.8 % (ref 11.5–15.5)
WBC: 14.1 10*3/uL — ABNORMAL HIGH (ref 4.0–10.5)

## 2018-08-08 LAB — BASIC METABOLIC PANEL
Anion gap: 9 (ref 5–15)
BUN: 19 mg/dL (ref 8–23)
CALCIUM: 8.5 mg/dL — AB (ref 8.9–10.3)
CO2: 25 mmol/L (ref 22–32)
Chloride: 102 mmol/L (ref 98–111)
Creatinine, Ser: 1.23 mg/dL — ABNORMAL HIGH (ref 0.44–1.00)
GFR calc Af Amer: 52 mL/min — ABNORMAL LOW (ref >60)
GFR calc non Af Amer: 45 mL/min — ABNORMAL LOW (ref >60)
Glucose, Bld: 155 mg/dL — ABNORMAL HIGH (ref 70–99)
Potassium: 4.8 mmol/L (ref 3.5–5.1)
Sodium: 136 mmol/L (ref 135–145)

## 2018-08-08 LAB — GLUCOSE, CAPILLARY
Glucose-Capillary: 113 mg/dL — ABNORMAL HIGH (ref 70–99)
Glucose-Capillary: 117 mg/dL — ABNORMAL HIGH (ref 70–99)
Glucose-Capillary: 123 mg/dL — ABNORMAL HIGH (ref 70–99)
Glucose-Capillary: 125 mg/dL — ABNORMAL HIGH (ref 70–99)
Glucose-Capillary: 154 mg/dL — ABNORMAL HIGH (ref 70–99)
Glucose-Capillary: 162 mg/dL — ABNORMAL HIGH (ref 70–99)

## 2018-08-08 LAB — PHOSPHORUS
Phosphorus: 1.1 mg/dL — ABNORMAL LOW (ref 2.5–4.6)
Phosphorus: 1.5 mg/dL — ABNORMAL LOW (ref 2.5–4.6)

## 2018-08-08 LAB — HEPARIN LEVEL (UNFRACTIONATED)
Heparin Unfractionated: 0.21 IU/mL — ABNORMAL LOW (ref 0.30–0.70)
Heparin Unfractionated: 0.3 IU/mL (ref 0.30–0.70)

## 2018-08-08 LAB — MAGNESIUM
Magnesium: 1.5 mg/dL — ABNORMAL LOW (ref 1.7–2.4)
Magnesium: 1.7 mg/dL (ref 1.7–2.4)

## 2018-08-08 SURGERY — RIGHT/LEFT HEART CATH AND CORONARY ANGIOGRAPHY
Anesthesia: LOCAL

## 2018-08-08 MED ORDER — SODIUM CHLORIDE 0.9% FLUSH: 3.0000 mL | INTRAVENOUS | Status: DC | PRN

## 2018-08-08 MED ORDER — ASPIRIN 81 MG PO CHEW
81.0000 mg | CHEWABLE_TABLET | Freq: Every day | ORAL | Status: DC
  Administered 2018-08-09 – 2018-08-15 (×6): 81 mg
  Filled 2018-08-08 (×8): qty 1

## 2018-08-08 MED ORDER — SODIUM CHLORIDE 0.9% FLUSH
3.0000 mL | Freq: Two times a day (BID) | INTRAVENOUS | Status: DC
  Administered 2018-08-08 – 2018-08-09 (×3): 3 mL via INTRAVENOUS

## 2018-08-08 MED ORDER — SODIUM CHLORIDE 0.9 % IV SOLN
INTRAVENOUS | Status: DC
  Administered 2018-08-08: 06:00:00 via INTRAVENOUS

## 2018-08-08 MED ORDER — ASPIRIN 81 MG PO CHEW
81.0000 mg | CHEWABLE_TABLET | ORAL | Status: AC
  Administered 2018-08-08: 81 mg via ORAL
  Filled 2018-08-08: qty 1

## 2018-08-08 MED ORDER — SODIUM CHLORIDE 0.9 % IV SOLN: 250.0000 mL | INTRAVENOUS | Status: DC | PRN

## 2018-08-08 MED ORDER — FUROSEMIDE 10 MG/ML IJ SOLN
60.0000 mg | Freq: Two times a day (BID) | INTRAMUSCULAR | Status: DC
  Administered 2018-08-08 – 2018-08-09 (×3): 60 mg via INTRAVENOUS
  Filled 2018-08-08 (×3): qty 6

## 2018-08-08 NOTE — Progress Notes (Signed)
eLink Physician-Brief Progress Note Patient Name: [name] DOB: 27-Jun-1951 MRN: [id number]   Date of Service  08/08/2018  HPI/Events of Note  Oliguria - Bladder scan with 320 mL residual. Request for Foley catheter.   eICU Interventions  Will order: 1. Place Foley catheter.      Intervention Category Intermediate Interventions: Oliguria - evaluation and management  Sommer,Steven Eugene 08/08/2018, 12:13 AM

## 2018-08-08 NOTE — Progress Notes (Addendum)
Progress Note  Patient Name: [name] Date of Encounter: 08/08/2018  Primary Cardiologist: No primary care provider on file.   Subjective   Intubated.  Denies pain or discomfort.  Inpatient Medications    Scheduled Meds: . [START ON 08/09/2018] aspirin  81 mg Per Tube Daily  . atorvastatin  40 mg Per Tube q1800  . chlorhexidine gluconate (MEDLINE KIT)  15 mL Mouth Rinse BID  . feeding supplement (VITAL HIGH PROTEIN)  1,000 mL Per Tube Q24H  . furosemide  60 mg Intravenous BID  . insulin aspart  0-15 Units Subcutaneous Q4H  . mouth rinse  15 mL Mouth Rinse 10 times per day  . multivitamin  15 mL Oral Daily  . pantoprazole (PROTONIX) IV  40 mg Intravenous Q24H  . sodium chloride flush  3 mL Intravenous Q12H  . sodium chloride flush  3 mL Intravenous Q12H   Continuous Infusions: . sodium chloride Stopped (08/07/18 1831)  . sodium chloride    . sodium chloride 10 mL/hr at 08/08/18 0800  . heparin 900 Units/hr (08/08/18 0800)  . phenylephrine (NEO-SYNEPHRINE) Adult infusion 50 mcg/min (08/08/18 0900)   PRN Meds: sodium chloride, sodium chloride, acetaminophen **OR** acetaminophen, albuterol, bisacodyl, docusate, fentaNYL (SUBLIMAZE) injection, fentaNYL (SUBLIMAZE) injection, midazolam, midazolam, ondansetron **OR** ondansetron (ZOFRAN) IV, sodium chloride flush, sodium chloride flush   Vital Signs    Vitals:   08/08/18 0600 08/08/18 0700 08/08/18 0744 08/08/18 0800  BP: 105/67 100/67 101/68 119/86  Pulse: 98 92 91 (!) 104  Resp: (!) 26 20 (!) 22 (!) 31  Temp: 99.3 F (37.4 C) 99.5 F (37.5 C)  99.5 F (37.5 C)  TempSrc:      SpO2: 96% 96% 96% 92%  Weight:      Height:        Intake/Output Summary (Last 24 hours) at 08/08/2018 0915 Last data filed at 08/08/2018 0800 Gross per 24 hour  Intake 2443.67 ml  Output 750 ml  Net 1693.67 ml   Last 3 Weights 08/08/2018 08/07/2018 08/06/2018  Weight (lbs) 143 lb 4.8 oz 141 lb 4.8 oz 139 lb 8 oz  Weight (kg) 65 kg 64.093  kg 63.277 kg      Telemetry    Sinus rhythm, sinus tachycardia.  PVCs.  - Personally Reviewed  ECG    Sinus rhythm.  Rate 87 bpm. R axis deviation.  Poor R wave progression.  QTc 435m.  - Personally Reviewed  Physical Exam   VS:  BP 119/86 (BP Location: Left Arm)   Pulse (!) 104   Temp 99.5 F (37.5 C)   Resp (!) 31   Ht '5\' 2"'$  (1.575 m)   Wt 65 kg   SpO2 92%   BMI 26.21 kg/m  , BMI Body mass index is 26.21 kg/m. GENERAL:  Critically ill-appearing HEENT: Pupils equal round and reactive, fundi not visualized, oral mucosa unremarkable NECK: + jugular venous distention, waveform within normal limits, carotid upstroke brisk and symmetric, no bruits LUNGS:  Clear to auscultation bilaterally HEART:  RRR.  PMI not displaced or sustained,S1 and S2 within normal limits, no S3, no S4, no clicks, no rubs, no murmurs ABD:  Flat, positive bowel sounds normal in frequency in pitch, no bruits, no rebound, no guarding, no midline pulsatile mass, no hepatomegaly, no splenomegaly EXT:  2 plus pulses throughout, no edema, no cyanosis no clubbing SKIN:  No rashes no nodules NEURO:  Cranial nerves II through XII grossly intact, motor grossly intact throughout PSYCH:  Cognitively intact,  oriented to person place and time   Labs    Chemistry Recent Labs  Lab 08/06/18 [phone]  08/07/18 0239 08/07/18 0912 08/07/18 1016 08/08/18 0457  NA 132*   < > 130* 129* 131* 136  K 2.6*   < > 3.6 4.5 3.7 4.8  CL 91*  --  92*  --   --  102  CO2 19*  --  20*  --   --  25  GLUCOSE 264*  --  357*  --   --  155*  BUN 11  --  13  --   --  19  CREATININE 1.11*  --  1.16*  --   --  1.23*  CALCIUM 8.5*  --  7.9*  --   --  8.5*  PROT 7.0  --  7.5  --   --   --   ALBUMIN 2.5*  --  2.6*  --   --   --   AST 22  --  24  --   --   --   ALT 12  --  15  --   --   --   ALKPHOS 83  --  30*  --   --   --   BILITOT 1.4*  --  0.8  --   --   --   GFRNONAA 51*  --  48*  --   --  45*  GFRAA 59*  --  56*  --   --  52*    ANIONGAP 22*  --  18*  --   --  9   < > = values in this interval not displayed.     Hematology Recent Labs  Lab 08/06/18 1093  08/07/18 0239 08/07/18 0912 08/07/18 1016 08/08/18 0457  WBC 7.6  --  13.1*  --   --  14.1*  RBC 5.36*  --  5.27*  --   --  4.64  HGB 15.5*   < > 14.9 14.6 11.9* 13.2  HCT 46.0   < > 45.9 43.0 35.0* 39.6  MCV 85.8  --  87.1  --   --  85.3  MCH 28.9  --  28.3  --   --  28.4  MCHC 33.7  --  32.5  --   --  33.3  RDW 13.4  --  13.5  --   --  13.8  PLT 325  --  315  --   --  329   < > = values in this interval not displayed.    Cardiac Enzymes Recent Labs  Lab 08/06/18 0628 08/06/18 1148 08/06/18 1321  TROPONINI 7.53* 6.55* 6.29*   No results for input(s): TROPIPOC in the last 168 hours.   BNP Recent Labs  Lab 08/06/18 0628  BNP 1,535.2*     DDimer No results for input(s): DDIMER in the last 168 hours.   Radiology    Dg Chest 1 View  Result Date: 08/06/2018 CLINICAL DATA:  68 year old female with history of shortness of breath and cough for the past 2-3 days. Central chest pain while coughing. EXAM: CHEST  1 VIEW COMPARISON:  Chest x-ray 08/06/2018. FINDINGS: There is cephalization of the pulmonary vasculature, indistinctness of the interstitial markings, and patchy airspace disease throughout the lungs bilaterally suggestive of moderate pulmonary edema. Bibasilar opacities may reflect additional areas of atelectasis and/or consolidation. Small bilateral pleural effusions. Mild cardiomegaly. Upper mediastinal contours are within normal limits. IMPRESSION: 1. The appearance the chest again suggest congestive  heart failure, as above. The possibility of superimposed infection is not entirely excluded. Electronically Signed   By: Vinnie Langton M.D.   On: 08/06/2018 14:10   Dg Chest Port 1 View  Result Date: 08/08/2018 CLINICAL DATA:  Intubation, respiratory failure EXAM: PORTABLE CHEST 1 VIEW COMPARISON:  Portable exam 0548 hours compared to  08/07/2018 FINDINGS: Tip of endotracheal tube projects 3.7 cm above carina. Nasogastric tube extends into stomach. Enlargement of cardiac silhouette. Diffuse BILATERAL airspace infiltrates improved RIGHT upper lobe since previous exam. Questionable bibasilar effusions. No pneumothorax. Bones demineralized. IMPRESSION: Diffuse BILATERAL pulmonary infiltrates improved in RIGHT upper lobe versus previous study. Electronically Signed   By: Lavonia Dana M.D.   On: 08/08/2018 08:48   Dg Chest Port 1 View  Result Date: 08/07/2018 CLINICAL DATA:  ETT and NG tube placement EXAM: PORTABLE CHEST 1 VIEW COMPARISON:  08/06/2018 FINDINGS: There has been interval placement of endotracheal tube, tip positioned at the carina, and esophagogastric tube with tip and side port below the diaphragm. There is slightly worsened dense, patchy heterogeneous bilateral pulmonary opacity and layering small pleural effusions. Cardiomegaly. IMPRESSION: 1. There has been interval placement of endotracheal tube, tip positioned at the carina, and esophagogastric tube with tip and side port below the diaphragm. Recommend slight retraction of endotracheal tube. 2. There is slightly worsened dense, patchy heterogeneous bilateral pulmonary opacity and layering small pleural effusions, consistent with severe edema, multifocal infection, and/or ARDS. Electronically Signed   By: Eddie Candle M.D.   On: 08/07/2018 10:31   Dg Chest Port 1 View  Result Date: 08/06/2018 CLINICAL DATA:  Increasing shortness of breath tonight EXAM: PORTABLE CHEST 1 VIEW COMPARISON:  Portable exam 2224 hours compared to 1328 hours FINDINGS: Enlargement of cardiac silhouette. Stable mediastinal contours. BILATERAL pulmonary infiltrates again identified, somewhat patchy in appearance, question edema versus infection. Small LEFT pleural effusion. No pneumothorax. Bones demineralized. IMPRESSION: Patchy BILATERAL pulmonary infiltrates question pulmonary edema versus infection,  infiltrates slightly increased in upper lobe since earlier study. Electronically Signed   By: Lavonia Dana M.D.   On: 08/06/2018 22:36    Cardiac Studies   Echo 08/06/18: IMPRESSIONS   1. The left ventricle has severely reduced systolic function of 54-00%. The cavity size was mildly increased. There is no increased left ventricular wall thickness. Left ventricular diastology could not be evaluated due to indeterminent diastolic  function. Elevated left ventricular end-diastolic pressure The E/e' is 20.  2. Severe global LV hypokinesis with akinesis of the anteroseptum, inferoseptum, and apex.  3. The right ventricle has normal systolic function. The cavity was normal. There is no increase in right ventricular wall thickness.  4. RVSP:46.8 mmHg.  5. Mild MR, likely due to LV dysfunction and leaflet tenting.  6. The tricuspid valve is normal in structure. Tricuspid valve regurgitation is mild-moderate.  7. The aortic valve is normal in structure. There is mild thickening of the aortic valve.  8. The pulmonic valve was grossly normal. Pulmonic valve regurgitation is mild by color flow Doppler.  9. The inferior vena cava was normal in size with <50% respiratory variability.  Patient Profile     68 y.o. female with DM, hypertension, and tobacco abuse here with NSTEMI.  Found to have LVEF 20-25%.  Taken for [facility] on 08/07/18 and developed hypoxic respiratory failure and PEA arrest.    Assessment & Plan    # NSTEMI: Troponin elevated to 7.5.  Will need LHC when more clinically stable.  Continue aspirin, heparin, and atorvastatin,   #  Acute systolic and diastolic heart failure: # Cardiogenic shock: LVEF 20-25%, newly diagnosed this admission.  She developed hypoxic respiratory failure moving to the cath table and PEA arrest.  Pulmonary edema persists on CXR.  BNP 1535.  Will start lasix '60mg'$  IV bid.  Currently on phenylephrine for BP support.  # Tobacco abuse: Will discuss cessation once  extubated.  Time spent: 45 minutes-Greater than 50% of this time was spent in counseling, explanation of diagnosis, planning of further management, and coordination of care.   For questions or updates, please contact [place] Please consult [email or web address] for contact info under        Signed, Skeet Latch, MD  08/08/2018, 9:15 AM

## 2018-08-08 NOTE — Progress Notes (Signed)
ANTICOAGULATION CONSULT NOTE - Follow Up Consult  Pharmacy Consult for IV heparin Indication: NSTEMI  No Known Allergies  Patient Measurements: Height: 5\' 2"  (157.5 cm) Weight: 143 lb 4.8 oz (65 kg) IBW/kg (Calculated) : 50.1 Heparin Dosing Weight: 62.8 kg  Vital Signs: Temp: 98.4 F (36.9 C) (02/11 1800) BP: 107/71 (02/11 1800) Pulse Rate: 94 (02/11 1800)  Labs: Recent Labs    08/06/18 0623 08/06/18 0628  08/06/18 1148 08/06/18 1321  08/07/18 0239 08/07/18 0912 08/07/18 1016 08/07/18 2037 08/08/18 0457 08/08/18 1820  HGB  --  15.5*   < >  --   --   --  14.9 14.6 11.9*  --  13.2  --   HCT  --  46.0   < >  --   --   --  45.9 43.0 35.0*  --  39.6  --   PLT  --  325  --   --   --   --  315  --   --   --  329  --   HEPARINUNFRC  --   --   --   --   --    < > 0.91*  --   --  0.14* 0.21* 0.30  CREATININE 1.11*  --   --   --   --   --  1.16*  --   --   --  1.23*  --   TROPONINI  --  7.53*  --  6.55* 6.29*  --   --   --   --   --   --   --    < > = values in this interval not displayed.    Estimated Creatinine Clearance: 38.2 mL/min (A) (by C-G formula based on SCr of 1.23 mg/dL (H)).   Medications:  Infusions:  . sodium chloride Stopped (08/07/18 1831)  . sodium chloride    . sodium chloride 10 mL/hr at 08/08/18 1600  . heparin 900 Units/hr (08/08/18 1600)  . phenylephrine (NEO-SYNEPHRINE) Adult infusion 25 mcg/min (08/08/18 1820)    Assessment: 68 yo female on IV Heparin s/p PEA arrest, NSTEMI.  Heparin level 0.3 at goal on heparin drip 900uts/hr.  Hgb stable, Pltc stable.  No overt bleeding or complications noted.  Goal of Therapy:  Heparin level 0.3-0.7 units/ml Monitor platelets by anticoagulation protocol: Yes   Plan:  1.Continue Heparin 900 units/hr. 2. Daily heparin level and CBC.    Bonnita Nasuti Pharm.D. CPP, BCPS Clinical Pharmacist [phone] 08/08/2018 7:40 PM

## 2018-08-08 NOTE — Progress Notes (Signed)
ANTICOAGULATION CONSULT NOTE - Follow Up Consult  Pharmacy Consult for heparin Indication: NSTEMI  Labs: Recent Labs    08/06/18 0623 08/06/18 0628  08/06/18 1148 08/06/18 1321  08/07/18 0239 08/07/18 0912 08/07/18 1016 08/07/18 2037 08/08/18 0457  HGB  --  15.5*   < >  --   --   --  14.9 14.6 11.9*  --  13.2  HCT  --  46.0   < >  --   --   --  45.9 43.0 35.0*  --  39.6  PLT  --  325  --   --   --   --  315  --   --   --  329  HEPARINUNFRC  --   --   --   --   --    < > 0.91*  --   --  0.14* 0.21*  CREATININE 1.11*  --   --   --   --   --  1.16*  --   --   --  1.23*  TROPONINI  --  7.53*  --  6.55* 6.29*  --   --   --   --   --   --    < > = values in this interval not displayed.    Assessment: 68yo female remains subtherapeutic on heparin despite rate increase.  Goal of Therapy:  Heparin level 0.3-0.7 units/ml   Plan:  Will increase heparin gtt by 2-3 units/kg/hr to 900 units/hr and check level in 8 hours.    Wynona Neat, PharmD, BCPS  08/08/2018,6:31 AM

## 2018-08-08 NOTE — Consult Note (Signed)
NAME:  Keyuna Cuthrell, MRN:  [id number], DOB:  1950-07-14, LOS: 2 ADMISSION DATE:  08/06/2018,  CONSULTATION DATE: 08/07/2018 REFERRING MD: Cardiology, CHIEF COMPLAINT: Respiratory failure status post PEA cardiac arrest  Brief History   68 year old female with coronary artery disease status post PEA arrest on 08/07/2018  History of present illness   68 year old female with history of coronary disease, lifelong smoker, diabetes mellitus who was admitted 08/06/2018 with a non-ST elevation MI.  He was initially mated to [facility].  She was scheduled for left heart catheterization on 08/07/2018.  While being placed on table she had a PEA arrest that lasted for 4 minutes of CPR and 1 round of epinephrine.  He had return of spontaneous circulation was awake alert but looked in acute discomfort.  She was not mentating well was not moving oxygen.  She had a profound acidosis and was transferred to the heart unit where she was urgently intubated and placed on full mechanical ventilatory support.  Heart catheterization has been postponed at this time.  Her CBG was noted to be greater than 200 in the Cath Lab.  Pulmonary critical care is asked to assume her care at this time.  Past Medical History  Coronary artery disease Hypertension Diabetes Tobacco abuse  Significant Hospital Events   08/07/2018 PEA arrest CPR for 4 minutes 1 round of epi with return of spontaneous circulation  Consults:  08/06/2008 cardiology 08/07/2008 pulmonary critical care  Procedures:  08/07/2018 intubation>>  Significant Diagnostic Tests:    Micro Data:  None  Antimicrobials:  None  Interim history/subjective:  No events overnight, no new complaints  Objective   Blood pressure 96/69, pulse 88, temperature 99 F (37.2 C), resp. rate 20, height 5\' 2"  (1.575 m), weight 65 kg, SpO2 96 %.    Vent Mode: PRVC FiO2 (%):  [40 %-50 %] 40 % Set Rate:  [20 bmp] 20 bmp Vt Set:  [400 mL] 400 mL PEEP:  [5 cmH20] 5  cmH20 Pressure Support:  [10 cmH20] 10 cmH20 Plateau Pressure:  [22 cmH20-23 cmH20] 23 cmH20   Intake/Output Summary (Last 24 hours) at 08/08/2018 1331 Last data filed at 08/08/2018 1220 Gross per 24 hour  Intake 2202.18 ml  Output 920 ml  Net 1282.18 ml   Filed Weights   08/06/18 1908 08/07/18 0625 08/08/18 0500  Weight: 63.3 kg 64.1 kg 65 kg   Examination: General: Awake and interactive, moving all ext to command HENT: [place]/AT, PERRL, EOM-I and MMM Lungs: CTA bilaterally Cardiovascular: RRR, Nl S1/S2 and -M/R/G Abdomen: Protuberant abdomen with positive bowel sounds Extremities: Cool to touch but unable to palpate pulses Neuro: Prior to intubation awake follow commands moves all extremities  I reviewed CXR myself, ETT is in a good position  Resolved Hospital Problem list   Discussed with PCCM-NP  Assessment & Plan:  Vent dependent respiratory failure secondary to proximal status post PEA cardiac arrest.  Profound acidosis proportion to PCO2 of 67.  Lifelong tobacco abuse Presumed pulmonary edema on chest x-ray Continue full vent support Hold of extubation until back from the cath lab Will wean in AM Adjust vent for ABG  Post 6 minutes PEA arrest requiring 4 minutes of CPR and 1 amp of epinephrine on 08/07/2018 while inCath Lab.  She did not have a cardiac cath at this time. Her coronary disease with recent chest pain and admission an elevated troponin 6.7 Hypotension post intubation History of hypertension Full monitoring Cardiology to determine when to take to the cath  lab Neo peripherally at a low dose KVO IVF Hold antihypertensives Review twelve-lead EKG Heparin drip per cards  Diabetic Sliding scale insulin  PCCM will continue to manage  Best practice:  Diet: N.p.o. for now Pain/Anxiety/Delirium protocol (if indicated): Sedation protocol VAP protocol (if indicated): Yes DVT prophylaxis: Subcu heparin GI prophylaxis: PPI Glucose control: Sliding scale  insulin protocol Mobility: Bedrest Code Status: Full Family Communication: 08/07/2018 no family currently at bedside Disposition: Transferred from cardiac Cath Lab to 2 H10 and emergently intubated  Labs   CBC: Recent Labs  Lab 08/06/18 0628 08/06/18 0940 08/07/18 0239 08/07/18 0912 08/07/18 1016 08/08/18 0457  WBC 7.6  --  13.1*  --   --  14.1*  NEUTROABS 4.8  --   --   --   --  10.8*  HGB 15.5* 15.3* 14.9 14.6 11.9* 13.2  HCT 46.0 45.0 45.9 43.0 35.0* 39.6  MCV 85.8  --  87.1  --   --  85.3  PLT 325  --  315  --   --  623    Basic Metabolic Panel: Recent Labs  Lab 08/06/18 0623 08/06/18 0628 08/06/18 0940 08/06/18 1927 08/07/18 0239 08/07/18 0912 08/07/18 1016 08/08/18 0457  NA 132*  --  127*  --  130* 129* 131* 136  K 2.6*  --  2.6* 2.6* 3.6 4.5 3.7 4.8  CL 91*  --   --   --  92*  --   --  102  CO2 19*  --   --   --  20*  --   --  25  GLUCOSE 264*  --   --   --  357*  --   --  155*  BUN 11  --   --   --  13  --   --  19  CREATININE 1.11*  --   --   --  1.16*  --   --  1.23*  CALCIUM 8.5*  --   --   --  7.9*  --   --  8.5*  MG  --  1.6*  --   --  1.5*  --   --  1.5*  PHOS  --   --   --   --  3.5  --   --  1.1*   GFR: Estimated Creatinine Clearance: 38.2 mL/min (A) (by C-G formula based on SCr of 1.23 mg/dL (H)). Recent Labs  Lab 08/06/18 0628  08/06/18 1148 08/06/18 1927 08/07/18 0018 08/07/18 0239 08/08/18 0457  WBC 7.6  --   --   --   --  13.1* 14.1*  LATICACIDVEN  --    < > 4.1* 6.6* 4.3* 5.7*  --    < > = values in this interval not displayed.    Liver Function Tests: Recent Labs  Lab 08/06/18 0623 08/07/18 0239  AST 22 24  ALT 12 15  ALKPHOS 83 30*  BILITOT 1.4* 0.8  PROT 7.0 7.5  ALBUMIN 2.5* 2.6*   Recent Labs  Lab 08/06/18 0628  LIPASE 19   No results for input(s): AMMONIA in the last 168 hours.  ABG    Component Value Date/Time   PHART 7.474 (H) 08/07/2018 1016   PCO2ART 34.4 08/07/2018 1016   PO2ART 174.0 (H) 08/07/2018  1016   HCO3 26.2 08/07/2018 1016   TCO2 27 08/07/2018 1016   ACIDBASEDEF 6.0 (H) 08/07/2018 0912   O2SAT 100.0 08/07/2018 1016     Coagulation Profile: No results for  input(s): INR, PROTIME in the last 168 hours.  Cardiac Enzymes: Recent Labs  Lab 08/06/18 0628 08/06/18 1148 08/06/18 1321  TROPONINI 7.53* 6.55* 6.29*    HbA1C: Hgb A1c MFr Bld  Date/Time Value Ref Range Status  08/06/2018 02:00 PM 10.2 (H) 4.8 - 5.6 % Final    Comment:    (NOTE) Pre diabetes:          5.7%-6.4% Diabetes:              >6.4% Glycemic control for   <7.0% adults with diabetes     CBG: Recent Labs  Lab 08/07/18 2116 08/08/18 0017 08/08/18 0342 08/08/18 0800 08/08/18 1146  GLUCAP 134* 125* 113* 162* 117*    Review of Systems:   na  Past Medical History  She,  has a past medical history of Diabetes mellitus without complication ([place]) and Hypertension.   Surgical History   History reviewed. No pertinent surgical history.   Social History   reports that she has been smoking. She has been smoking about 1.00 pack per day. She has never used smokeless tobacco. She reports current alcohol use. She reports that she does not use drugs.   Family History   Her family history is negative for Breast cancer.   Allergies No Known Allergies   Home Medications  Prior to Admission medications   Medication Sig Start Date End Date Taking? Authorizing Provider  hydrochlorothiazide (MICROZIDE) 12.5 MG capsule Take 12.5 mg by mouth daily.   Yes [provider]  Insulin Glargine, 2 Unit Dial, (TOUJEO MAX SOLOSTAR) 300 UNIT/ML SOPN Inject 50 Units into the skin at bedtime.   Yes [provider]  metFORMIN (GLUCOPHAGE) 1000 MG tablet Take 1,000 mg by mouth 2 (two) times daily with a meal.   Yes [provider]    The patient is critically ill with multiple organ systems failure and requires high complexity decision making for assessment and support, frequent evaluation  and titration of therapies, application of advanced monitoring technologies and extensive interpretation of multiple databases.   Critical Care Time devoted to patient care services described in this note is  33  Minutes. This time reflects time of care of this signee Dr Jennet Maduro. This critical care time does not reflect procedure time, or teaching time or supervisory time of PA/NP/Med student/Med Resident etc but could involve care discussion time.  Rush Farmer, M.D. [facility] Pulmonary/Critical Care Medicine. Pager: [phone]. After hours pager: [phone].

## 2018-08-09 ENCOUNTER — Inpatient Hospital Stay (HOSPITAL_COMMUNITY): Payer: Medicare HMO

## 2018-08-09 DIAGNOSIS — E876 Hypokalemia: Secondary | ICD-10-CM

## 2018-08-09 DIAGNOSIS — G9340 Encephalopathy, unspecified: Secondary | ICD-10-CM

## 2018-08-09 LAB — CBC
HCT: 38.5 % (ref 36.0–46.0)
Hemoglobin: 12.6 g/dL (ref 12.0–15.0)
MCH: 28.5 pg (ref 26.0–34.0)
MCHC: 32.7 g/dL (ref 30.0–36.0)
MCV: 87.1 fL (ref 80.0–100.0)
PLATELETS: 321 10*3/uL (ref 150–400)
RBC: 4.42 MIL/uL (ref 3.87–5.11)
RDW: 14.4 % (ref 11.5–15.5)
WBC: 11.7 10*3/uL — ABNORMAL HIGH (ref 4.0–10.5)
nRBC: 0 % (ref 0.0–0.2)

## 2018-08-09 LAB — CULTURE, RESPIRATORY

## 2018-08-09 LAB — BASIC METABOLIC PANEL
ANION GAP: 13 (ref 5–15)
Anion gap: 8 (ref 5–15)
BUN: 25 mg/dL — ABNORMAL HIGH (ref 8–23)
BUN: 28 mg/dL — ABNORMAL HIGH (ref 8–23)
CO2: 27 mmol/L (ref 22–32)
CO2: 24 mmol/L (ref 22–32)
CREATININE: 1.31 mg/dL — AB (ref 0.44–1.00)
Calcium: 8.1 mg/dL — ABNORMAL LOW (ref 8.9–10.3)
Calcium: 8.5 mg/dL — ABNORMAL LOW (ref 8.9–10.3)
Chloride: 100 mmol/L (ref 98–111)
Chloride: 100 mmol/L (ref 98–111)
Creatinine, Ser: 1.35 mg/dL — ABNORMAL HIGH (ref 0.44–1.00)
GFR calc Af Amer: 48 mL/min — ABNORMAL LOW (ref >60)
GFR calc Af Amer: 46 mL/min — ABNORMAL LOW (ref >60)
GFR calc non Af Amer: 41 mL/min — ABNORMAL LOW (ref >60)
GFR calc non Af Amer: 40 mL/min — ABNORMAL LOW (ref >60)
Glucose, Bld: 152 mg/dL — ABNORMAL HIGH (ref 70–99)
Glucose, Bld: 163 mg/dL — ABNORMAL HIGH (ref 70–99)
Potassium: 3.8 mmol/L (ref 3.5–5.1)
Potassium: 3.3 mmol/L — ABNORMAL LOW (ref 3.5–5.1)
Sodium: 135 mmol/L (ref 135–145)
Sodium: 137 mmol/L (ref 135–145)

## 2018-08-09 LAB — GLUCOSE, CAPILLARY
Glucose-Capillary: 113 mg/dL — ABNORMAL HIGH (ref 70–99)
Glucose-Capillary: 124 mg/dL — ABNORMAL HIGH (ref 70–99)
Glucose-Capillary: 152 mg/dL — ABNORMAL HIGH (ref 70–99)
Glucose-Capillary: 160 mg/dL — ABNORMAL HIGH (ref 70–99)
Glucose-Capillary: 182 mg/dL — ABNORMAL HIGH (ref 70–99)
Glucose-Capillary: 189 mg/dL — ABNORMAL HIGH (ref 70–99)
Glucose-Capillary: 218 mg/dL — ABNORMAL HIGH (ref 70–99)

## 2018-08-09 LAB — BLOOD GAS, ARTERIAL
Acid-Base Excess: 1.7 mmol/L (ref 0.0–2.0)
Bicarbonate: 24.9 mmol/L (ref 20.0–28.0)
Drawn by: 347621
FIO2: 40
MECHVT: 400 mL
O2 Saturation: 97.4 %
PCO2 ART: 34.2 mmHg (ref 32.0–48.0)
PEEP: 5 cmH2O
Patient temperature: 98.6
RATE: 20 resp/min
pH, Arterial: 7.477 — ABNORMAL HIGH (ref 7.350–7.450)
pO2, Arterial: 93 mmHg (ref 83.0–108.0)

## 2018-08-09 LAB — PHOSPHORUS
Phosphorus: 1.8 mg/dL — ABNORMAL LOW (ref 2.5–4.6)
Phosphorus: 2.8 mg/dL (ref 2.5–4.6)

## 2018-08-09 LAB — MAGNESIUM
MAGNESIUM: 1.4 mg/dL — AB (ref 1.7–2.4)
Magnesium: 1.6 mg/dL — ABNORMAL LOW (ref 1.7–2.4)

## 2018-08-09 LAB — CULTURE, RESPIRATORY W GRAM STAIN: Culture: NORMAL

## 2018-08-09 LAB — HEPARIN LEVEL (UNFRACTIONATED): Heparin Unfractionated: 0.24 IU/mL — ABNORMAL LOW (ref 0.30–0.70)

## 2018-08-09 MED ORDER — MAGNESIUM OXIDE 400 (241.3 MG) MG PO TABS
800.0000 mg | ORAL_TABLET | Freq: Once | ORAL | Status: AC
  Administered 2018-08-09: 800 mg via NASOGASTRIC
  Filled 2018-08-09: qty 2

## 2018-08-09 MED ORDER — SENNA 8.6 MG PO TABS
2.0000 | ORAL_TABLET | Freq: Every day | ORAL | Status: DC
  Administered 2018-08-09 – 2018-08-15 (×5): 17.2 mg
  Filled 2018-08-09 (×8): qty 2

## 2018-08-09 MED ORDER — POTASSIUM & SODIUM PHOSPHATES 280-160-250 MG PO PACK
2.0000 | PACK | Freq: Once | ORAL | Status: AC
  Administered 2018-08-09: 2
  Filled 2018-08-09: qty 2

## 2018-08-09 MED ORDER — MAGNESIUM SULFATE 2 GM/50ML IV SOLN
2.0000 g | Freq: Once | INTRAVENOUS | Status: AC
  Administered 2018-08-09: 2 g via INTRAVENOUS
  Filled 2018-08-09: qty 50

## 2018-08-09 MED ORDER — POTASSIUM CHLORIDE 20 MEQ PO PACK
40.0000 meq | PACK | Freq: Once | ORAL | Status: AC
  Administered 2018-08-09: 40 meq via NASOGASTRIC
  Filled 2018-08-09: qty 2

## 2018-08-09 MED ORDER — FENTANYL 2500MCG IN NS 250ML (10MCG/ML) PREMIX INFUSION
0.0000 ug/h | INTRAVENOUS | Status: DC
  Administered 2018-08-09: 50 ug/h via INTRAVENOUS
  Filled 2018-08-09: qty 250

## 2018-08-09 MED ORDER — METOLAZONE 2.5 MG PO TABS: 2.5000 mg | ORAL_TABLET | Freq: Once | ORAL | Status: DC

## 2018-08-09 MED ORDER — SODIUM CHLORIDE 0.9% FLUSH: 3.0000 mL | Freq: Two times a day (BID) | INTRAVENOUS | Status: DC

## 2018-08-09 MED ORDER — FUROSEMIDE 10 MG/ML IJ SOLN
15.0000 mg/h | INTRAVENOUS | Status: DC
  Administered 2018-08-09: 15 mg/h via INTRAVENOUS
  Filled 2018-08-09: qty 25
  Filled 2018-08-09: qty 20
  Filled 2018-08-09: qty 25

## 2018-08-09 MED ORDER — ASPIRIN 81 MG PO CHEW
81.0000 mg | CHEWABLE_TABLET | ORAL | Status: AC
  Administered 2018-08-10: 81 mg via ORAL
  Filled 2018-08-09: qty 1

## 2018-08-09 MED ORDER — FUROSEMIDE 10 MG/ML IJ SOLN
80.0000 mg | Freq: Three times a day (TID) | INTRAMUSCULAR | Status: DC
  Administered 2018-08-09 (×2): 80 mg via INTRAVENOUS
  Filled 2018-08-09: qty 8

## 2018-08-09 MED ORDER — SODIUM CHLORIDE 0.9 % IV SOLN
INTRAVENOUS | Status: DC
  Administered 2018-08-10: 05:00:00 via INTRAVENOUS

## 2018-08-09 MED ORDER — SODIUM CHLORIDE 0.9% FLUSH: 3.0000 mL | INTRAVENOUS | Status: DC | PRN

## 2018-08-09 MED ORDER — SODIUM CHLORIDE 0.9 % IV SOLN: 250.0000 mL | INTRAVENOUS | Status: DC | PRN

## 2018-08-09 NOTE — Progress Notes (Addendum)
NAME:  [name], MRN:  [id number], DOB:  10-09-1950, LOS: 3 ADMISSION DATE:  08/06/2018,  CONSULTATION DATE: 08/07/2018 REFERRING MD: Cardiology, CHIEF COMPLAINT: Respiratory failure status post PEA cardiac arrest  Brief History   68 year old female with coronary artery disease status post PEA arrest on 08/07/2018  History of present illness   68 year old female with history of coronary disease, lifelong smoker, diabetes mellitus who was admitted 08/06/2018 with a non-ST elevation MI.  He was initially m[facility].  She was scheduled for left heart catheterization on 08/07/2018.  While being placed on table she had a PEA arrest that lasted for 4 minutes of CPR and 1 round of epinephrine.  He had return of spontaneous circulation was awake alert but looked in acute discomfort.  She was not mentating well was not moving oxygen.  She had a profound acidosis and was transferred to the heart unit where she was urgently intubated and placed on full mechanical ventilatory support.  Heart catheterization has been postponed at this time.  Her CBG was noted to be greater than 200 in the Cath Lab.  Pulmonary critical care is asked to assume her care at this time.  Past Medical History  Coronary artery disease Hypertension Diabetes Tobacco abuse  Significant Hospital Events   08/07/2018 PEA arrest CPR for 4 minutes 1 round of epi with return of spontaneous circulation  Consults:  08/06/2008 cardiology 08/07/2008 pulmonary critical care  Procedures:  08/07/2018 intubation>>  Significant Diagnostic Tests:   CXR 2/12> interval increase in bilateral opacities, R>L   Micro Data:  None  Antimicrobials:  None  Interim history/subjective:  NAEO. Weaned off neo Patient awake, alert, calm. Remains intubated, lightly sedated   Objective   Blood pressure 93/80, pulse 94, temperature 98.2 F (36.8 C), temperature source Oral, resp. rate (!) 23, height 5\' 2"  (1.575 m), weight 65.8 kg, SpO2  98 %.    Vent Mode: PRVC FiO2 (%):  [40 %] 40 % Set Rate:  [20 bmp] 20 bmp Vt Set:  [400 mL] 400 mL PEEP:  [5 cmH20] 5 cmH20 Plateau Pressure:  [21 cmH20-23 cmH20] 23 cmH20   Intake/Output Summary (Last 24 hours) at 08/09/2018 6803 Last data filed at 08/09/2018 0800 Gross per 24 hour  Intake 2020.15 ml  Output 980 ml  Net 1040.15 ml   Filed Weights   08/07/18 0625 08/08/18 0500 08/09/18 0500  Weight: 64.1 kg 65 kg 65.8 kg   Examination: General: Older adult female. Intubated, awake, NAD  HENT: NCAT, ETT secure, trachea midline, mmm  Lungs: Bilateral crackles, R>L  Cardiovascular: RRR, no r/g/m, 1+ radial pulses, 1+ pedal pulses  Abdomen: Round abdomen, non-tender, bowel sounds x4  Extremities: Symmetrical bulk and tone. Trace edema BLE  Neuro: Awake, alert, following commands PERRL 43mm    Resolved Hospital Problem list     Assessment & Plan:   Respiratory failure requiring mechanical ventilation -Vent dependent respiratory failure secondary to proximal status post PEA cardiac arrest.  Profound acidosis proportion to PCO2 of 67.  Lifelong tobacco abuse 2/12> Worsening pulmonary edema on CXR P Continue mechanical ventilatory support Wean PEEP/FiO2 for SpO2> 92 Adjust rate, Vt  PRN per ABG Will try PSV/CPAP today-- possible cardiac cath tomorrow. Would like to optimize respiratory status for hopeful extubation after cardiac cath. (If no cath tomorrow and if vent weaning is progressing well, possibly consider extubation plan independent of cath timing) -Cardiology to increase lasix today to 80mg  q8 in effort to optimize pulm status  -  Patient requiring regular PRN fent pushes, will start a low dose fent gtt for ETT discomfort while intubated, RASS goal 0 -CXR in AM   S/p PEA arrest  -Post 6 minutes PEA arrest requiring 4 minutes of CPR and 1 amp of epinephrine on 08/07/2018 while inCath Lab and cardiac cath was not completed at that time -resultant cardiogenic shock  Her  coronary disease with recent chest pain and admission an elevated troponin 6.7 Hypotension post intubation History of hypertension P -will need cardiac cath in future, cardiology to determine  -continue tele -holding antihypertensives in setting of hypotension -Has been weaned off neo this morning. If hypotension recurs, favor milrinone as discussed with cardiology at bedside 2/12  -Heparin gtt per cardiology, pharmacy dose  Electrolyte abnormality -Hypomagnesemia -Hypophosphatemia -at risk hypokalemia given diuresis  P Monitor BMP. Mag phos Replete PRN   Diabetic Sliding scale insulin    Best practice:  Diet: NPO  Pain/Anxiety/Delirium protocol (if indicated): Fentanyl  VAP protocol (if indicated): Yes DVT prophylaxis: Heparin gtt  GI prophylaxis: PPI Glucose control: Sliding scale insulin protocol Mobility: Bedrest Code Status: Full Family Communication: None at bedside at time of NP rounds 2/12 Disposition: Continue ICU level of care   Labs   CBC: Recent Labs  Lab 08/06/18 0628  08/07/18 0239 08/07/18 0912 08/07/18 1016 08/08/18 0457 08/09/18 0547  WBC 7.6  --  13.1*  --   --  14.1* 11.7*  NEUTROABS 4.8  --   --   --   --  10.8*  --   HGB 15.5*   < > 14.9 14.6 11.9* 13.2 12.6  HCT 46.0   < > 45.9 43.0 35.0* 39.6 38.5  MCV 85.8  --  87.1  --   --  85.3 87.1  PLT 325  --  315  --   --  329 321   < > = values in this interval not displayed.    Basic Metabolic Panel: Recent Labs  Lab 08/06/18 0623 08/06/18 0628  08/07/18 0239 08/07/18 0912 08/07/18 1016 08/08/18 0457 08/08/18 1554 08/09/18 0547  NA 132*  --    < > 130* 129* 131* 136  --  135  K 2.6*  --    < > 3.6 4.5 3.7 4.8  --  3.8  CL 91*  --   --  92*  --   --  102  --  100  CO2 19*  --   --  20*  --   --  25  --  27  GLUCOSE 264*  --   --  357*  --   --  155*  --  152*  BUN 11  --   --  13  --   --  19  --  25*  CREATININE 1.11*  --   --  1.16*  --   --  1.23*  --  1.31*  CALCIUM 8.5*  --    --  7.9*  --   --  8.5*  --  8.1*  MG  --  1.6*  --  1.5*  --   --  1.5* 1.7 1.4*  PHOS  --   --   --  3.5  --   --  1.1* 1.5* 1.8*   < > = values in this interval not displayed.   GFR: Estimated Creatinine Clearance: 36.1 mL/min (A) (by C-G formula based on SCr of 1.31 mg/dL (H)). Recent Labs  Lab 08/06/18 [phone]  08/06/18 1148 08/06/18  1927 08/07/18 0018 08/07/18 0239 08/08/18 0457 08/09/18 0547  WBC 7.6  --   --   --   --  13.1* 14.1* 11.7*  LATICACIDVEN  --    < > 4.1* 6.6* 4.3* 5.7*  --   --    < > = values in this interval not displayed.    Liver Function Tests: Recent Labs  Lab 08/06/18 0623 08/07/18 0239  AST 22 24  ALT 12 15  ALKPHOS 83 30*  BILITOT 1.4* 0.8  PROT 7.0 7.5  ALBUMIN 2.5* 2.6*   Recent Labs  Lab 08/06/18 0628  LIPASE 19   No results for input(s): AMMONIA in the last 168 hours.  ABG    Component Value Date/Time   PHART 7.477 (H) 08/09/2018 0445   PCO2ART 34.2 08/09/2018 0445   PO2ART 93.0 08/09/2018 0445   HCO3 24.9 08/09/2018 0445   TCO2 27 08/07/2018 1016   ACIDBASEDEF 6.0 (H) 08/07/2018 0912   O2SAT 97.4 08/09/2018 0445     Coagulation Profile: No results for input(s): INR, PROTIME in the last 168 hours.  Cardiac Enzymes: Recent Labs  Lab 08/06/18 0628 08/06/18 1148 08/06/18 1321  TROPONINI 7.53* 6.55* 6.29*    HbA1C: Hgb A1c MFr Bld  Date/Time Value Ref Range Status  08/06/2018 02:00 PM 10.2 (H) 4.8 - 5.6 % Final    Comment:    (NOTE) Pre diabetes:          5.7%-6.4% Diabetes:              >6.4% Glycemic control for   <7.0% adults with diabetes     CBG: Recent Labs  Lab 08/08/18 1645 08/08/18 1950 08/09/18 0005 08/09/18 0425 08/09/18 0801  GLUCAP 123* 154* 160* 124* 152*     Critical Care Time: 35 minutes  Eliseo Gum MSN, AGACNP-BC [place] 08/09/2018, 8:33 AM Pg [phone]  Attending Note:  68 year old female s/p PEA cardiac arrest on 2/10 after a NSTEMI.   Patient was intubated during the code and remains intubated for pulmonary edema.  On exam, she is alert and interactive, moving all ext to commands with coarse BS.  I reviewed CXR myself, pulmonary edema noted and ETT is in a good position.  Discussed with PCCM-NP.  Will hold off any further weaning efforts at this time.  Plan for cath in AM then will likely extubate after.  Increase lasix as ordered.  PCCM will continue to manage.  The patient is critically ill with multiple organ systems failure and requires high complexity decision making for assessment and support, frequent evaluation and titration of therapies, application of advanced monitoring technologies and extensive interpretation of multiple databases.   Critical Care Time devoted to patient care services described in this note is  32  Minutes. This time reflects time of care of this signee Dr Jennet Maduro. This critical care time does not reflect procedure time, or teaching time or supervisory time of PA/NP/Med student/Med Resident etc but could involve care discussion time.  Rush Farmer, M.D. [facility] Pulmonary/Critical Care Medicine. Pager: [phone]. After hours pager: [phone].

## 2018-08-09 NOTE — Progress Notes (Signed)
[facility] patient update  Fluid balance is only net negative approximately 1L.  She is still significantly volume overloaded.  We are having a hard time diuresing enough to be able to successfully extubate.  I suspect that this is due to low output.  BP is low but MAP >60 off pressors.  We will start a lasix drip and give a dose of metolazone.  Plan for [facility] tomorrow while intubated.  This will help guide therapy.  I suspect she will benefit from inotrope and will require a PICC.  Tollie Canada C. Oval Linsey, MD, [facility] 08/09/2018 6:13 PM

## 2018-08-09 NOTE — Progress Notes (Signed)
ANTICOAGULATION CONSULT NOTE - Follow Up Consult  Pharmacy Consult for IV heparin Indication: NSTEMI  No Known Allergies  Patient Measurements: Height: 5\' 2"  (157.5 cm) Weight: 145 lb 1 oz (65.8 kg) IBW/kg (Calculated) : 50.1 Heparin Dosing Weight: 62.8 kg  Vital Signs: Temp: 98.2 F (36.8 C) (02/12 0800) Temp Source: Oral (02/12 0800) BP: 93/80 (02/12 0806) Pulse Rate: 94 (02/12 0806)  Labs: Recent Labs    08/06/18 1148 08/06/18 1321  08/07/18 0239  08/07/18 1016  08/08/18 0457 08/08/18 1820 08/09/18 0547  HGB  --   --   --  14.9   < > 11.9*  --  13.2  --  12.6  HCT  --   --   --  45.9   < > 35.0*  --  39.6  --  38.5  PLT  --   --   --  315  --   --   --  329  --  321  HEPARINUNFRC  --   --    < > 0.91*  --   --    < > 0.21* 0.30 0.24*  CREATININE  --   --   --  1.16*  --   --   --  1.23*  --  1.31*  TROPONINI 6.55* 6.29*  --   --   --   --   --   --   --   --    < > = values in this interval not displayed.    Estimated Creatinine Clearance: 36.1 mL/min (A) (by C-G formula based on SCr of 1.31 mg/dL (H)).   Medications:  Infusions:  . sodium chloride Stopped (08/07/18 1831)  . sodium chloride    . sodium chloride 10 mL/hr at 08/09/18 0800  . heparin 900 Units/hr (08/09/18 0800)  . phenylephrine (NEO-SYNEPHRINE) Adult infusion 10 mcg/min (08/09/18 0800)    Assessment: 68 yo female on IV Heparin s/p PEA arrest, NSTEMI. Pharmacy dosing heparin.  -heparin level= 0.24, CBC stable   Goal of Therapy:  Heparin level 0.3-0.7 units/ml Monitor platelets by anticoagulation protocol: Yes   Plan:  -Increase heparin to 1050 units/hr -Daily heparin level and CBC  Hildred Laser, PharmD Clinical Pharmacist **Pharmacist phone directory can now be found on [email or web address] ([email or web address]).  Listed under [place].

## 2018-08-09 NOTE — Progress Notes (Signed)
eLink Physician-Brief Progress Note Patient Name: [name] DOB: 03/04/1951 MRN: [id number]   Date of Service  08/09/2018  HPI/Events of Note  Repeat potassium 3.1, magnesium 1.6.  Patient intubated as OG tube and peripheral line  eICU Interventions  Ordered oral replacement of potassium and magnesium through OG tube.      Intervention Category Intermediate Interventions: Electrolyte abnormality - evaluation and management;Medication change / dose adjustment  Mady Gemma 08/09/2018, 9:24 PM

## 2018-08-09 NOTE — Progress Notes (Addendum)
Progress Note  Patient Name: [name] Date of Encounter: 08/09/2018  Primary Cardiologist: No primary care provider on file.   Subjective   Intubated.  Denies pain or discomfort.    Inpatient Medications    Scheduled Meds: . aspirin  81 mg Per Tube Daily  . atorvastatin  40 mg Per Tube q1800  . chlorhexidine gluconate (MEDLINE KIT)  15 mL Mouth Rinse BID  . feeding supplement (VITAL HIGH PROTEIN)  1,000 mL Per Tube Q24H  . furosemide  80 mg Intravenous Q8H  . insulin aspart  0-15 Units Subcutaneous Q4H  . mouth rinse  15 mL Mouth Rinse 10 times per day  . multivitamin  15 mL Oral Daily  . pantoprazole (PROTONIX) IV  40 mg Intravenous Q24H  . potassium & sodium phosphates  2 packet Per Tube Once  . sodium chloride flush  3 mL Intravenous Q12H  . sodium chloride flush  3 mL Intravenous Q12H   Continuous Infusions: . sodium chloride Stopped (08/07/18 1831)  . sodium chloride    . sodium chloride Stopped (08/09/18 0850)  . heparin 1,050 Units/hr (08/09/18 0900)  . magnesium sulfate 1 - 4 g bolus IVPB 50 mL/hr at 08/09/18 0900  . phenylephrine (NEO-SYNEPHRINE) Adult infusion Stopped (08/09/18 0846)   PRN Meds: sodium chloride, sodium chloride, acetaminophen **OR** acetaminophen, albuterol, bisacodyl, docusate, fentaNYL (SUBLIMAZE) injection, fentaNYL (SUBLIMAZE) injection, midazolam, midazolam, ondansetron **OR** ondansetron (ZOFRAN) IV, sodium chloride flush, sodium chloride flush   Vital Signs    Vitals:   08/09/18 0758 08/09/18 0800 08/09/18 0806 08/09/18 0900  BP:  93/80 93/80 105/77  Pulse:  93 94 97  Resp:  (!) 22 (!) 23 (!) 25  Temp: 98.2 F (36.8 C) 98.2 F (36.8 C)  98.4 F (36.9 C)  TempSrc:  Oral    SpO2:  98% 98% 98%  Weight:      Height:        Intake/Output Summary (Last 24 hours) at 08/09/2018 0915 Last data filed at 08/09/2018 0900 Gross per 24 hour  Intake 2049.39 ml  Output 1010 ml  Net 1039.39 ml   Last 3 Weights 08/09/2018 08/08/2018  08/07/2018  Weight (lbs) 145 lb 1 oz 143 lb 4.8 oz 141 lb 4.8 oz  Weight (kg) 65.8 kg 65 kg 64.093 kg      Telemetry    Sinus rhythm, sinus tachycardia.  PVCs.  - Personally Reviewed  ECG    Sinus rhythm.  Rate 87 bpm. R axis deviation.  Poor R wave progression.  QTc 43m.  - Personally Reviewed  Physical Exam   VS:  BP 105/77   Pulse 97   Temp 98.4 F (36.9 C)   Resp (!) 25   Ht _0  (1.575 m)   Wt 65.8 kg   SpO2 98%   BMI 26.53 kg/m  , BMI Body mass index is 26.53 kg/m. GENERAL:  Critically ill-appearing HEENT: Pupils equal round and reactive, fundi not visualized, oral mucosa unremarkable NECK: + jugular venous distention, waveform within normal limits, carotid upstroke brisk and symmetric, no bruits LUNGS:  Clear to auscultation bilaterally HEART:  RRR.  PMI not displaced or sustained,S1 and S2 within normal limits, no S3, no S4, no clicks, no rubs, no murmurs ABD:  Flat, positive bowel sounds normal in frequency in pitch, no bruits, no rebound, no guarding, no midline pulsatile mass, no hepatomegaly, no splenomegaly EXT:  2 plus pulses throughout, no edema, no cyanosis no clubbing SKIN:  No rashes no nodules  NEURO:  Cranial nerves II through XII grossly intact, motor grossly intact throughout [facility]:  Cognitively intact, oriented to person place and time   Labs    Chemistry Recent Labs  Lab 08/06/18 0623  08/07/18 0239  08/07/18 1016 08/08/18 0457 08/09/18 0547  NA 132*   < > 130*   < > 131* 136 135  K 2.6*   < > 3.6   < > 3.7 4.8 3.8  CL 91*  --  92*  --   --  102 100  CO2 19*  --  20*  --   --  25 27  GLUCOSE 264*  --  357*  --   --  155* 152*  BUN 11  --  13  --   --  19 25*  CREATININE 1.11*  --  1.16*  --   --  1.23* 1.31*  CALCIUM 8.5*  --  7.9*  --   --  8.5* 8.1*  PROT 7.0  --  7.5  --   --   --   --   ALBUMIN 2.5*  --  2.6*  --   --   --   --   AST 22  --  24  --   --   --   --   ALT 12  --  15  --   --   --   --   ALKPHOS 83  --  30*  --   --    --   --   BILITOT 1.4*  --  0.8  --   --   --   --   GFRNONAA 51*  --  48*  --   --  45* 41*  GFRAA 59*  --  56*  --   --  52* 48*  ANIONGAP 22*  --  18*  --   --  9 8   < > = values in this interval not displayed.     Hematology Recent Labs  Lab 08/07/18 0239  08/07/18 1016 08/08/18 0457 08/09/18 0547  WBC 13.1*  --   --  14.1* 11.7*  RBC 5.27*  --   --  4.64 4.42  HGB 14.9   < > 11.9* 13.2 12.6  HCT 45.9   < > 35.0* 39.6 38.5  MCV 87.1  --   --  85.3 87.1  MCH 28.3  --   --  28.4 28.5  MCHC 32.5  --   --  33.3 32.7  RDW 13.5  --   --  13.8 14.4  PLT 315  --   --  329 321   < > = values in this interval not displayed.    Cardiac Enzymes Recent Labs  Lab 08/06/18 0628 08/06/18 1148 08/06/18 1321  TROPONINI 7.53* 6.55* 6.29*   No results for input(s): TROPIPOC in the last 168 hours.   BNP Recent Labs  Lab 08/06/18 0628  BNP 1,535.2*     DDimer No results for input(s): DDIMER in the last 168 hours.   Radiology    Dg Chest Port 1 View  Result Date: 08/08/2018 CLINICAL DATA:  Intubation, respiratory failure EXAM: PORTABLE CHEST 1 VIEW COMPARISON:  Portable exam 0548 hours compared to 08/07/2018 FINDINGS: Tip of endotracheal tube projects 3.7 cm above carina. Nasogastric tube extends into stomach. Enlargement of cardiac silhouette. Diffuse BILATERAL airspace infiltrates improved RIGHT upper lobe since previous exam. Questionable bibasilar effusions. No pneumothorax. Bones demineralized. IMPRESSION: Diffuse BILATERAL pulmonary infiltrates improved  in RIGHT upper lobe versus previous study. Electronically Signed   By: Lavonia Dana M.D.   On: 08/08/2018 08:48   Dg Chest Port 1 View  Result Date: 08/07/2018 CLINICAL DATA:  ETT and NG tube placement EXAM: PORTABLE CHEST 1 VIEW COMPARISON:  08/06/2018 FINDINGS: There has been interval placement of endotracheal tube, tip positioned at the carina, and esophagogastric tube with tip and side port below the diaphragm. There is  slightly worsened dense, patchy heterogeneous bilateral pulmonary opacity and layering small pleural effusions. Cardiomegaly. IMPRESSION: 1. There has been interval placement of endotracheal tube, tip positioned at the carina, and esophagogastric tube with tip and side port below the diaphragm. Recommend slight retraction of endotracheal tube. 2. There is slightly worsened dense, patchy heterogeneous bilateral pulmonary opacity and layering small pleural effusions, consistent with severe edema, multifocal infection, and/or ARDS. Electronically Signed   By: Eddie Candle M.D.   On: 08/07/2018 10:31    Cardiac Studies   Echo 08/06/18: IMPRESSIONS   1. The left ventricle has severely reduced systolic function of 18-98%. The cavity size was mildly increased. There is no increased left ventricular wall thickness. Left ventricular diastology could not be evaluated due to indeterminent diastolic  function. Elevated left ventricular end-diastolic pressure The E/e' is 20.  2. Severe global LV hypokinesis with akinesis of the anteroseptum, inferoseptum, and apex.  3. The right ventricle has normal systolic function. The cavity was normal. There is no increase in right ventricular wall thickness.  4. RVSP:46.8 mmHg.  5. Mild MR, likely due to LV dysfunction and leaflet tenting.  6. The tricuspid valve is normal in structure. Tricuspid valve regurgitation is mild-moderate.  7. The aortic valve is normal in structure. There is mild thickening of the aortic valve.  8. The pulmonic valve was grossly normal. Pulmonic valve regurgitation is mild by color flow Doppler.  9. The inferior vena cava was normal in size with <50% respiratory variability.  Patient Profile     68 y.o. female with DM, hypertension, and tobacco abuse here with NSTEMI.  Found to have LVEF 20-25%.  Taken for [facility] on 08/07/18 and developed hypoxic respiratory failure and PEA arrest.    Assessment & Plan    # NSTEMI: Troponin elevated to 7.5.   Will need LHC.  We were hoping to cath her today.  However, she is net +1L and CXR appears worse.  Continue aspirin, heparin, and atorvastatin.  Start beta blocker when able.  Will aggressively diurese and plan tentatively for [facility] tomorrow.   # Acute systolic and diastolic heart failure: # Cardiogenic shock: LVEF 20-25%, newly diagnosed this admission.  She developed hypoxic respiratory failure moving to the cath table and PEA arrest.  Pulmonary edema worse today on CXR.  BNP 1535.  Increase lasix to 92m q8h.  Goal - 2 to 3L today.  Likely LHC tomorrow.  Now off pressors.  If she needs BP support would consider milrinone.  Will get LHC and RHC.  # Tobacco abuse: Will discuss cessation once extubated.     Time spent: 40 minutes-Greater than 50% of this time was spent in counseling, explanation of diagnosis, planning of further management, and coordination of care.   For questions or updates, please contact CWasecaPlease consult [email or web address] for contact info under        Signed, TSkeet Latch MD  08/09/2018, 9:15 AM

## 2018-08-09 NOTE — H&P (View-Only) (Signed)
Progress Note  Patient Name: [name] Date of Encounter: 08/09/2018  Primary Cardiologist: No primary care provider on file.   Subjective   Intubated.  Denies pain or discomfort.    Inpatient Medications    Scheduled Meds: . aspirin  81 mg Per Tube Daily  . atorvastatin  40 mg Per Tube q1800  . chlorhexidine gluconate (MEDLINE KIT)  15 mL Mouth Rinse BID  . feeding supplement (VITAL HIGH PROTEIN)  1,000 mL Per Tube Q24H  . furosemide  80 mg Intravenous Q8H  . insulin aspart  0-15 Units Subcutaneous Q4H  . mouth rinse  15 mL Mouth Rinse 10 times per day  . multivitamin  15 mL Oral Daily  . pantoprazole (PROTONIX) IV  40 mg Intravenous Q24H  . potassium & sodium phosphates  2 packet Per Tube Once  . sodium chloride flush  3 mL Intravenous Q12H  . sodium chloride flush  3 mL Intravenous Q12H   Continuous Infusions: . sodium chloride Stopped (08/07/18 1831)  . sodium chloride    . sodium chloride Stopped (08/09/18 0850)  . heparin 1,050 Units/hr (08/09/18 0900)  . magnesium sulfate 1 - 4 g bolus IVPB 50 mL/hr at 08/09/18 0900  . phenylephrine (NEO-SYNEPHRINE) Adult infusion Stopped (08/09/18 0846)   PRN Meds: sodium chloride, sodium chloride, acetaminophen **OR** acetaminophen, albuterol, bisacodyl, docusate, fentaNYL (SUBLIMAZE) injection, fentaNYL (SUBLIMAZE) injection, midazolam, midazolam, ondansetron **OR** ondansetron (ZOFRAN) IV, sodium chloride flush, sodium chloride flush   Vital Signs    Vitals:   08/09/18 0758 08/09/18 0800 08/09/18 0806 08/09/18 0900  BP:  93/80 93/80 105/77  Pulse:  93 94 97  Resp:  (!) 22 (!) 23 (!) 25  Temp: 98.2 F (36.8 C) 98.2 F (36.8 C)  98.4 F (36.9 C)  TempSrc:  Oral    SpO2:  98% 98% 98%  Weight:      Height:        Intake/Output Summary (Last 24 hours) at 08/09/2018 0915 Last data filed at 08/09/2018 0900 Gross per 24 hour  Intake 2049.39 ml  Output 1010 ml  Net 1039.39 ml   Last 3 Weights 08/09/2018 08/08/2018  08/07/2018  Weight (lbs) 145 lb 1 oz 143 lb 4.8 oz 141 lb 4.8 oz  Weight (kg) 65.8 kg 65 kg 64.093 kg      Telemetry    Sinus rhythm, sinus tachycardia.  PVCs.  - Personally Reviewed  ECG    Sinus rhythm.  Rate 87 bpm. R axis deviation.  Poor R wave progression.  QTc 43m.  - Personally Reviewed  Physical Exam   VS:  BP 105/77   Pulse 97   Temp 98.4 F (36.9 C)   Resp (!) 25   Ht _0  (1.575 m)   Wt 65.8 kg   SpO2 98%   BMI 26.53 kg/m  , BMI Body mass index is 26.53 kg/m. GENERAL:  Critically ill-appearing HEENT: Pupils equal round and reactive, fundi not visualized, oral mucosa unremarkable NECK: + jugular venous distention, waveform within normal limits, carotid upstroke brisk and symmetric, no bruits LUNGS:  Clear to auscultation bilaterally HEART:  RRR.  PMI not displaced or sustained,S1 and S2 within normal limits, no S3, no S4, no clicks, no rubs, no murmurs ABD:  Flat, positive bowel sounds normal in frequency in pitch, no bruits, no rebound, no guarding, no midline pulsatile mass, no hepatomegaly, no splenomegaly EXT:  2 plus pulses throughout, no edema, no cyanosis no clubbing SKIN:  No rashes no nodules  NEURO:  Cranial nerves II through XII grossly intact, motor grossly intact throughout [facility]:  Cognitively intact, oriented to person place and time   Labs    Chemistry Recent Labs  Lab 08/06/18 0623  08/07/18 0239  08/07/18 1016 08/08/18 0457 08/09/18 0547  NA 132*   < > 130*   < > 131* 136 135  K 2.6*   < > 3.6   < > 3.7 4.8 3.8  CL 91*  --  92*  --   --  102 100  CO2 19*  --  20*  --   --  25 27  GLUCOSE 264*  --  357*  --   --  155* 152*  BUN 11  --  13  --   --  19 25*  CREATININE 1.11*  --  1.16*  --   --  1.23* 1.31*  CALCIUM 8.5*  --  7.9*  --   --  8.5* 8.1*  PROT 7.0  --  7.5  --   --   --   --   ALBUMIN 2.5*  --  2.6*  --   --   --   --   AST 22  --  24  --   --   --   --   ALT 12  --  15  --   --   --   --   ALKPHOS 83  --  30*  --   --    --   --   BILITOT 1.4*  --  0.8  --   --   --   --   GFRNONAA 51*  --  48*  --   --  45* 41*  GFRAA 59*  --  56*  --   --  52* 48*  ANIONGAP 22*  --  18*  --   --  9 8   < > = values in this interval not displayed.     Hematology Recent Labs  Lab 08/07/18 0239  08/07/18 1016 08/08/18 0457 08/09/18 0547  WBC 13.1*  --   --  14.1* 11.7*  RBC 5.27*  --   --  4.64 4.42  HGB 14.9   < > 11.9* 13.2 12.6  HCT 45.9   < > 35.0* 39.6 38.5  MCV 87.1  --   --  85.3 87.1  MCH 28.3  --   --  28.4 28.5  MCHC 32.5  --   --  33.3 32.7  RDW 13.5  --   --  13.8 14.4  PLT 315  --   --  329 321   < > = values in this interval not displayed.    Cardiac Enzymes Recent Labs  Lab 08/06/18 0628 08/06/18 1148 08/06/18 1321  TROPONINI 7.53* 6.55* 6.29*   No results for input(s): TROPIPOC in the last 168 hours.   BNP Recent Labs  Lab 08/06/18 0628  BNP 1,535.2*     DDimer No results for input(s): DDIMER in the last 168 hours.   Radiology    Dg Chest Port 1 View  Result Date: 08/08/2018 CLINICAL DATA:  Intubation, respiratory failure EXAM: PORTABLE CHEST 1 VIEW COMPARISON:  Portable exam 0548 hours compared to 08/07/2018 FINDINGS: Tip of endotracheal tube projects 3.7 cm above carina. Nasogastric tube extends into stomach. Enlargement of cardiac silhouette. Diffuse BILATERAL airspace infiltrates improved RIGHT upper lobe since previous exam. Questionable bibasilar effusions. No pneumothorax. Bones demineralized. IMPRESSION: Diffuse BILATERAL pulmonary infiltrates improved  in RIGHT upper lobe versus previous study. Electronically Signed   By: Lavonia Dana M.D.   On: 08/08/2018 08:48   Dg Chest Port 1 View  Result Date: 08/07/2018 CLINICAL DATA:  ETT and NG tube placement EXAM: PORTABLE CHEST 1 VIEW COMPARISON:  08/06/2018 FINDINGS: There has been interval placement of endotracheal tube, tip positioned at the carina, and esophagogastric tube with tip and side port below the diaphragm. There is  slightly worsened dense, patchy heterogeneous bilateral pulmonary opacity and layering small pleural effusions. Cardiomegaly. IMPRESSION: 1. There has been interval placement of endotracheal tube, tip positioned at the carina, and esophagogastric tube with tip and side port below the diaphragm. Recommend slight retraction of endotracheal tube. 2. There is slightly worsened dense, patchy heterogeneous bilateral pulmonary opacity and layering small pleural effusions, consistent with severe edema, multifocal infection, and/or ARDS. Electronically Signed   By: Eddie Candle M.D.   On: 08/07/2018 10:31    Cardiac Studies   Echo 08/06/18: IMPRESSIONS   1. The left ventricle has severely reduced systolic function of 18-98%. The cavity size was mildly increased. There is no increased left ventricular wall thickness. Left ventricular diastology could not be evaluated due to indeterminent diastolic  function. Elevated left ventricular end-diastolic pressure The E/e' is 20.  2. Severe global LV hypokinesis with akinesis of the anteroseptum, inferoseptum, and apex.  3. The right ventricle has normal systolic function. The cavity was normal. There is no increase in right ventricular wall thickness.  4. RVSP:46.8 mmHg.  5. Mild MR, likely due to LV dysfunction and leaflet tenting.  6. The tricuspid valve is normal in structure. Tricuspid valve regurgitation is mild-moderate.  7. The aortic valve is normal in structure. There is mild thickening of the aortic valve.  8. The pulmonic valve was grossly normal. Pulmonic valve regurgitation is mild by color flow Doppler.  9. The inferior vena cava was normal in size with <50% respiratory variability.  Patient Profile     68 y.o. female with DM, hypertension, and tobacco abuse here with NSTEMI.  Found to have LVEF 20-25%.  Taken for [facility] on 08/07/18 and developed hypoxic respiratory failure and PEA arrest.    Assessment & Plan    # NSTEMI: Troponin elevated to 7.5.   Will need LHC.  We were hoping to cath her today.  However, she is net +1L and CXR appears worse.  Continue aspirin, heparin, and atorvastatin.  Start beta blocker when able.  Will aggressively diurese and plan tentatively for [facility] tomorrow.   # Acute systolic and diastolic heart failure: # Cardiogenic shock: LVEF 20-25%, newly diagnosed this admission.  She developed hypoxic respiratory failure moving to the cath table and PEA arrest.  Pulmonary edema worse today on CXR.  BNP 1535.  Increase lasix to 92m q8h.  Goal - 2 to 3L today.  Likely LHC tomorrow.  Now off pressors.  If she needs BP support would consider milrinone.  Will get LHC and RHC.  # Tobacco abuse: Will discuss cessation once extubated.     Time spent: 40 minutes-Greater than 50% of this time was spent in counseling, explanation of diagnosis, planning of further management, and coordination of care.   For questions or updates, please contact CWasecaPlease consult [email or web address] for contact info under        Signed, TSkeet Latch MD  08/09/2018, 9:15 AM

## 2018-08-10 ENCOUNTER — Encounter (HOSPITAL_COMMUNITY): Payer: Self-pay | Admitting: Cardiology

## 2018-08-10 ENCOUNTER — Inpatient Hospital Stay (HOSPITAL_COMMUNITY): Payer: Medicare HMO

## 2018-08-10 ENCOUNTER — Encounter (HOSPITAL_COMMUNITY)
Admission: EM | Disposition: A | Discharge status: Home Health | Payer: Self-pay | Source: Home / Self Care | Attending: Pulmonary Disease

## 2018-08-10 DIAGNOSIS — I5041 Acute combined systolic (congestive) and diastolic (congestive) heart failure: Secondary | ICD-10-CM

## 2018-08-10 DIAGNOSIS — I509 Heart failure, unspecified: Secondary | ICD-10-CM

## 2018-08-10 DIAGNOSIS — I2510 Atherosclerotic heart disease of native coronary artery without angina pectoris: Secondary | ICD-10-CM

## 2018-08-10 HISTORY — PX: RIGHT/LEFT HEART CATH AND CORONARY ANGIOGRAPHY: CATH118266

## 2018-08-10 HISTORY — PX: CORONARY STENT INTERVENTION: CATH118234

## 2018-08-10 LAB — BASIC METABOLIC PANEL
Anion gap: 15 (ref 5–15)
BUN: 28 mg/dL — ABNORMAL HIGH (ref 8–23)
CO2: 25 mmol/L (ref 22–32)
Calcium: 8.7 mg/dL — ABNORMAL LOW (ref 8.9–10.3)
Chloride: 97 mmol/L — ABNORMAL LOW (ref 98–111)
Creatinine, Ser: 1.32 mg/dL — ABNORMAL HIGH (ref 0.44–1.00)
GFR calc Af Amer: 48 mL/min — ABNORMAL LOW (ref >60)
GFR calc non Af Amer: 41 mL/min — ABNORMAL LOW (ref >60)
GLUCOSE: 153 mg/dL — AB (ref 70–99)
Potassium: 4.3 mmol/L (ref 3.5–5.1)
Sodium: 137 mmol/L (ref 135–145)

## 2018-08-10 LAB — POCT I-STAT 7, (LYTES, BLD GAS, ICA,H+H)
Acid-Base Excess: 3 mmol/L — ABNORMAL HIGH (ref 0.0–2.0)
Acid-Base Excess: 2 mmol/L (ref 0.0–2.0)
Acid-Base Excess: 1 mmol/L (ref 0.0–2.0)
Acid-base deficit: 8 mmol/L — ABNORMAL HIGH (ref 0.0–2.0)
Bicarbonate: 27.8 mmol/L (ref 20.0–28.0)
Bicarbonate: 27 mmol/L (ref 20.0–28.0)
Bicarbonate: 20 mmol/L (ref 20.0–28.0)
Bicarbonate: 25.7 mmol/L (ref 20.0–28.0)
Calcium, Ion: 1.17 mmol/L (ref 1.15–1.40)
Calcium, Ion: 1.13 mmol/L — ABNORMAL LOW (ref 1.15–1.40)
Calcium, Ion: 0.98 mmol/L — ABNORMAL LOW (ref 1.15–1.40)
Calcium, Ion: 1.08 mmol/L — ABNORMAL LOW (ref 1.15–1.40)
HCT: 39 % (ref 36.0–46.0)
HCT: 37 % (ref 36.0–46.0)
HCT: 35 % — ABNORMAL LOW (ref 36.0–46.0)
HCT: 40 % (ref 36.0–46.0)
HEMOGLOBIN: 13.3 g/dL (ref 12.0–15.0)
HEMOGLOBIN: 12.6 g/dL (ref 12.0–15.0)
Hemoglobin: 11.9 g/dL — ABNORMAL LOW (ref 12.0–15.0)
Hemoglobin: 13.6 g/dL (ref 12.0–15.0)
O2 SAT: 76 %
O2 Saturation: 88 %
O2 Saturation: 93 %
O2 Saturation: 85 %
POTASSIUM: 4.3 mmol/L (ref 3.5–5.1)
Potassium: 4 mmol/L (ref 3.5–5.1)
Potassium: 3.4 mmol/L — ABNORMAL LOW (ref 3.5–5.1)
Potassium: 4 mmol/L (ref 3.5–5.1)
SODIUM: 135 mmol/L (ref 135–145)
Sodium: 135 mmol/L (ref 135–145)
Sodium: 113 mmol/L — CL (ref 135–145)
Sodium: 134 mmol/L — ABNORMAL LOW (ref 135–145)
TCO2: 29 mmol/L (ref 22–32)
TCO2: 28 mmol/L (ref 22–32)
TCO2: 21 mmol/L — ABNORMAL LOW (ref 22–32)
TCO2: 27 mmol/L (ref 22–32)
pCO2 arterial: 43.8 mmHg (ref 32.0–48.0)
pCO2 arterial: 42.5 mmHg (ref 32.0–48.0)
pCO2 arterial: 42.6 mmHg (ref 32.0–48.0)
pCO2 arterial: 48.1 mmHg — ABNORMAL HIGH (ref 32.0–48.0)
pH, Arterial: 7.41 (ref 7.350–7.450)
pH, Arterial: 7.412 (ref 7.350–7.450)
pH, Arterial: 7.227 — ABNORMAL LOW (ref 7.350–7.450)
pH, Arterial: 7.389 (ref 7.350–7.450)
pO2, Arterial: 54 mmHg — ABNORMAL LOW (ref 83.0–108.0)
pO2, Arterial: 67 mmHg — ABNORMAL LOW (ref 83.0–108.0)
pO2, Arterial: 49 mmHg — ABNORMAL LOW (ref 83.0–108.0)
pO2, Arterial: 51 mmHg — ABNORMAL LOW (ref 83.0–108.0)

## 2018-08-10 LAB — MAGNESIUM
Magnesium: 1.7 mg/dL (ref 1.7–2.4)
Magnesium: 2.1 mg/dL (ref 1.7–2.4)

## 2018-08-10 LAB — POCT I-STAT EG7
Acid-Base Excess: 2 mmol/L (ref 0.0–2.0)
Acid-Base Excess: 3 mmol/L — ABNORMAL HIGH (ref 0.0–2.0)
Bicarbonate: 27.6 mmol/L (ref 20.0–28.0)
Bicarbonate: 29 mmol/L — ABNORMAL HIGH (ref 20.0–28.0)
Calcium, Ion: 1.09 mmol/L — ABNORMAL LOW (ref 1.15–1.40)
Calcium, Ion: 1.15 mmol/L (ref 1.15–1.40)
HCT: 38 % (ref 36.0–46.0)
HEMATOCRIT: 37 % (ref 36.0–46.0)
Hemoglobin: 12.6 g/dL (ref 12.0–15.0)
Hemoglobin: 12.9 g/dL (ref 12.0–15.0)
O2 SAT: 63 %
O2 Saturation: 61 %
POTASSIUM: 4.1 mmol/L (ref 3.5–5.1)
Potassium: 3.9 mmol/L (ref 3.5–5.1)
Sodium: 135 mmol/L (ref 135–145)
Sodium: 137 mmol/L (ref 135–145)
TCO2: 30 mmol/L (ref 22–32)
TCO2: 29 mmol/L (ref 22–32)
pCO2, Ven: 46.2 mmHg (ref 44.0–60.0)
pCO2, Ven: 48.9 mmHg (ref 44.0–60.0)
pH, Ven: 7.381 (ref 7.250–7.430)
pH, Ven: 7.384 (ref 7.250–7.430)
pO2, Ven: 33 mmHg (ref 32.0–45.0)
pO2, Ven: 34 mmHg (ref 32.0–45.0)

## 2018-08-10 LAB — CBC
HCT: 41.3 % (ref 36.0–46.0)
Hemoglobin: 13.2 g/dL (ref 12.0–15.0)
MCH: 27.8 pg (ref 26.0–34.0)
MCHC: 32 g/dL (ref 30.0–36.0)
MCV: 86.9 fL (ref 80.0–100.0)
PLATELETS: 327 10*3/uL (ref 150–400)
RBC: 4.75 MIL/uL (ref 3.87–5.11)
RDW: 14.3 % (ref 11.5–15.5)
WBC: 13.3 10*3/uL — ABNORMAL HIGH (ref 4.0–10.5)
nRBC: 0 % (ref 0.0–0.2)

## 2018-08-10 LAB — URINALYSIS, ROUTINE W REFLEX MICROSCOPIC
Bilirubin Urine: NEGATIVE
GLUCOSE, UA: NEGATIVE mg/dL
Hgb urine dipstick: NEGATIVE
Ketones, ur: NEGATIVE mg/dL
Leukocytes,Ua: NEGATIVE
Nitrite: NEGATIVE
Protein, ur: NEGATIVE mg/dL
Specific Gravity, Urine: 1.009 (ref 1.005–1.030)
pH: 5 (ref 5.0–8.0)

## 2018-08-10 LAB — COOXEMETRY PANEL
CARBOXYHEMOGLOBIN: 0.8 % (ref 0.5–1.5)
Carboxyhemoglobin: 1.2 % (ref 0.5–1.5)
Methemoglobin: 1.3 % (ref 0.0–1.5)
Methemoglobin: 0.8 % (ref 0.0–1.5)
O2 Saturation: 42.1 %
O2 Saturation: 61.9 %
Total hemoglobin: 13.4 g/dL (ref 12.0–16.0)
Total hemoglobin: 13 g/dL (ref 12.0–16.0)

## 2018-08-10 LAB — GLUCOSE, CAPILLARY
GLUCOSE-CAPILLARY: 144 mg/dL — AB (ref 70–99)
Glucose-Capillary: 163 mg/dL — ABNORMAL HIGH (ref 70–99)
Glucose-Capillary: 169 mg/dL — ABNORMAL HIGH (ref 70–99)
Glucose-Capillary: 217 mg/dL — ABNORMAL HIGH (ref 70–99)

## 2018-08-10 LAB — LACTIC ACID, PLASMA
Lactic Acid, Venous: 2.2 mmol/L (ref 0.5–1.9)
Lactic Acid, Venous: 1.7 mmol/L (ref 0.5–1.9)

## 2018-08-10 LAB — POCT ACTIVATED CLOTTING TIME
Activated Clotting Time: 477 seconds
Activated Clotting Time: 505 seconds

## 2018-08-10 LAB — PHOSPHORUS
Phosphorus: 3.2 mg/dL (ref 2.5–4.6)
Phosphorus: 2.5 mg/dL (ref 2.5–4.6)

## 2018-08-10 LAB — HEPARIN LEVEL (UNFRACTIONATED): Heparin Unfractionated: 0.38 IU/mL (ref 0.30–0.70)

## 2018-08-10 SURGERY — RIGHT/LEFT HEART CATH AND CORONARY ANGIOGRAPHY
Anesthesia: LOCAL

## 2018-08-10 MED ORDER — VERAPAMIL HCL 2.5 MG/ML IV SOLN
INTRAVENOUS | Status: AC
  Filled 2018-08-10: qty 2

## 2018-08-10 MED ORDER — LIDOCAINE HCL (PF) 1 % IJ SOLN
INTRAMUSCULAR | Status: DC | PRN
  Administered 2018-08-10: 5 mL
  Administered 2018-08-10: 2 mL

## 2018-08-10 MED ORDER — SODIUM CHLORIDE 0.9 % IV SOLN
INTRAVENOUS | Status: DC | PRN
  Administered 2018-08-10: 4 ug/kg/min via INTRAVENOUS

## 2018-08-10 MED ORDER — NITROGLYCERIN 1 MG/10 ML FOR IR/CATH LAB
INTRA_ARTERIAL | Status: AC
  Filled 2018-08-10: qty 10

## 2018-08-10 MED ORDER — TICAGRELOR 90 MG PO TABS
90.0000 mg | ORAL_TABLET | Freq: Two times a day (BID) | ORAL | Status: DC
  Administered 2018-08-10 – 2018-08-11 (×2): 90 mg via ORAL
  Filled 2018-08-10 (×3): qty 1

## 2018-08-10 MED ORDER — SODIUM CHLORIDE 0.9% FLUSH: 10.0000 mL | INTRAVENOUS | Status: DC | PRN

## 2018-08-10 MED ORDER — FENTANYL CITRATE (PF) 100 MCG/2ML IJ SOLN
INTRAMUSCULAR | Status: AC
  Filled 2018-08-10: qty 2

## 2018-08-10 MED ORDER — METOLAZONE 5 MG PO TABS
5.0000 mg | ORAL_TABLET | Freq: Once | ORAL | Status: AC
  Administered 2018-08-10: 5 mg via ORAL
  Filled 2018-08-10: qty 1

## 2018-08-10 MED ORDER — TICAGRELOR 90 MG PO TABS
180.0000 mg | ORAL_TABLET | Freq: Once | ORAL | Status: AC
  Administered 2018-08-10: 180 mg via ORAL
  Filled 2018-08-10: qty 2

## 2018-08-10 MED ORDER — HEPARIN (PORCINE) IN NACL 1000-0.9 UT/500ML-% IV SOLN
INTRAVENOUS | Status: AC
  Filled 2018-08-10: qty 1000

## 2018-08-10 MED ORDER — MIDAZOLAM HCL 2 MG/2ML IJ SOLN
INTRAMUSCULAR | Status: AC
  Filled 2018-08-10: qty 2

## 2018-08-10 MED ORDER — CANGRELOR TETRASODIUM 50 MG IV SOLR
INTRAVENOUS | Status: AC
  Filled 2018-08-10: qty 50

## 2018-08-10 MED ORDER — CANGRELOR BOLUS VIA INFUSION
INTRAVENOUS | Status: DC | PRN
  Administered 2018-08-10: 39085.2 ug via INTRAVENOUS

## 2018-08-10 MED ORDER — HYDRALAZINE HCL 20 MG/ML IJ SOLN: 5.0000 mg | INTRAMUSCULAR | Status: AC | PRN

## 2018-08-10 MED ORDER — HEPARIN SODIUM (PORCINE) 5000 UNIT/ML IJ SOLN
5000.0000 [IU] | Freq: Three times a day (TID) | INTRAMUSCULAR | Status: DC
  Administered 2018-08-10 – 2018-08-17 (×20): 5000 [IU] via SUBCUTANEOUS
  Filled 2018-08-10 (×19): qty 1

## 2018-08-10 MED ORDER — VERAPAMIL HCL 2.5 MG/ML IV SOLN
INTRAVENOUS | Status: DC | PRN
  Administered 2018-08-10: 10 mL via INTRA_ARTERIAL

## 2018-08-10 MED ORDER — MIDAZOLAM HCL 2 MG/2ML IJ SOLN
INTRAMUSCULAR | Status: DC | PRN
  Administered 2018-08-10: 1 mg via INTRAVENOUS
  Administered 2018-08-10: 2 mg via INTRAVENOUS
  Administered 2018-08-10: 1 mg via INTRAVENOUS
  Administered 2018-08-10: 2 mg via INTRAVENOUS

## 2018-08-10 MED ORDER — POTASSIUM CHLORIDE 20 MEQ/15ML (10%) PO SOLN
40.0000 meq | Freq: Three times a day (TID) | ORAL | Status: AC
  Administered 2018-08-10 (×2): 40 meq
  Filled 2018-08-10 (×2): qty 30

## 2018-08-10 MED ORDER — METOLAZONE 5 MG PO TABS: 10.0000 mg | ORAL_TABLET | Freq: Once | ORAL | Status: DC

## 2018-08-10 MED ORDER — HEPARIN (PORCINE) IN NACL 1000-0.9 UT/500ML-% IV SOLN
INTRAVENOUS | Status: DC | PRN
  Administered 2018-08-10 (×3): 500 mL

## 2018-08-10 MED ORDER — MAGNESIUM SULFATE 2 GM/50ML IV SOLN
2.0000 g | Freq: Once | INTRAVENOUS | Status: AC
  Administered 2018-08-10: 2 g via INTRAVENOUS
  Filled 2018-08-10: qty 50

## 2018-08-10 MED ORDER — SODIUM CHLORIDE 0.9 % IV SOLN: 250.0000 mL | INTRAVENOUS | Status: DC | PRN

## 2018-08-10 MED ORDER — LABETALOL HCL 5 MG/ML IV SOLN: 10.0000 mg | INTRAVENOUS | Status: AC | PRN

## 2018-08-10 MED ORDER — NITROGLYCERIN 1 MG/10 ML FOR IR/CATH LAB
INTRA_ARTERIAL | Status: DC | PRN
  Administered 2018-08-10: 200 ug

## 2018-08-10 MED ORDER — HEPARIN SODIUM (PORCINE) 1000 UNIT/ML IJ SOLN
INTRAMUSCULAR | Status: DC | PRN
  Administered 2018-08-10 (×2): 4000 [IU] via INTRAVENOUS

## 2018-08-10 MED ORDER — HEPARIN SODIUM (PORCINE) 1000 UNIT/ML IJ SOLN
INTRAMUSCULAR | Status: AC
  Filled 2018-08-10: qty 1

## 2018-08-10 MED ORDER — MILRINONE LACTATE IN DEXTROSE 20-5 MG/100ML-% IV SOLN
0.2500 ug/kg/min | INTRAVENOUS | Status: DC
  Administered 2018-08-10 – 2018-08-11 (×2): 0.25 ug/kg/min via INTRAVENOUS
  Filled 2018-08-10 (×2): qty 100

## 2018-08-10 MED ORDER — SODIUM CHLORIDE 0.9% FLUSH
3.0000 mL | INTRAVENOUS | Status: DC | PRN
  Administered 2018-08-12: 3 mL via INTRAVENOUS
  Filled 2018-08-10: qty 3

## 2018-08-10 MED ORDER — FLUCONAZOLE 40 MG/ML PO SUSR
200.0000 mg | Freq: Every day | ORAL | Status: AC
  Administered 2018-08-10: 200 mg
  Filled 2018-08-10 (×2): qty 5

## 2018-08-10 MED ORDER — SPIRONOLACTONE 12.5 MG HALF TABLET
12.5000 mg | ORAL_TABLET | Freq: Every day | ORAL | Status: DC
  Administered 2018-08-10: 12.5 mg via ORAL
  Filled 2018-08-10: qty 1

## 2018-08-10 MED ORDER — SODIUM CHLORIDE 0.9% FLUSH
10.0000 mL | Freq: Two times a day (BID) | INTRAVENOUS | Status: DC
  Administered 2018-08-11 – 2018-08-12 (×3): 10 mL

## 2018-08-10 MED ORDER — DIGOXIN 125 MCG PO TABS
0.1250 mg | ORAL_TABLET | Freq: Every day | ORAL | Status: DC
  Administered 2018-08-10 – 2018-08-17 (×8): 0.125 mg via ORAL
  Filled 2018-08-10 (×9): qty 1

## 2018-08-10 MED ORDER — SODIUM CHLORIDE 0.9 % IV SOLN
INTRAVENOUS | Status: AC
  Administered 2018-08-10: 10:00:00 via INTRAVENOUS

## 2018-08-10 MED ORDER — SODIUM CHLORIDE 0.9% FLUSH
3.0000 mL | Freq: Two times a day (BID) | INTRAVENOUS | Status: DC
  Administered 2018-08-10 – 2018-08-11 (×3): 3 mL via INTRAVENOUS

## 2018-08-10 MED ORDER — IOHEXOL 350 MG/ML SOLN
INTRAVENOUS | Status: DC | PRN
  Administered 2018-08-10: 135 mL via INTRAVENOUS

## 2018-08-10 MED ORDER — LIDOCAINE HCL (PF) 1 % IJ SOLN
INTRAMUSCULAR | Status: AC
  Filled 2018-08-10: qty 30

## 2018-08-10 MED ORDER — FUROSEMIDE 10 MG/ML IJ SOLN
10.0000 mg/h | INTRAVENOUS | Status: DC
  Administered 2018-08-10: 10 mg/h via INTRAVENOUS
  Filled 2018-08-10: qty 25

## 2018-08-10 SURGICAL SUPPLY — 22 items
BALLN SAPPHIRE 2.5X12 (BALLOONS) ×2
BALLN SAPPHIRE ~~LOC~~ 3.0X12 (BALLOONS) ×2 IMPLANT
BALLOON SAPPHIRE 2.5X12 (BALLOONS) ×1 IMPLANT
CATH OPTITORQUE TIG 4.0 5F (CATHETERS) ×2 IMPLANT
CATH SWAN GANZ VIP 7.5F (CATHETERS) ×2 IMPLANT
CATH VISTA GUIDE 6FR XBLAD3.5 (CATHETERS) ×2 IMPLANT
DEVICE RAD TR BAND REGULAR (VASCULAR PRODUCTS) ×2 IMPLANT
ELECT DEFIB PAD ADLT CADENCE (PAD) ×2 IMPLANT
GLIDESHEATH SLEND A-KIT 6F 22G (SHEATH) ×2 IMPLANT
GUIDEWIRE INQWIRE 1.5J.035X260 (WIRE) ×1 IMPLANT
INQWIRE 1.5J .035X260CM (WIRE) ×2
KIT ENCORE 26 ADVANTAGE (KITS) ×2 IMPLANT
KIT HEART LEFT (KITS) ×2 IMPLANT
PACK CARDIAC CATHETERIZATION (CUSTOM PROCEDURE TRAY) ×2 IMPLANT
SHEATH PINNACLE 8F 10CM (SHEATH) ×2 IMPLANT
SHEATH PROBE COVER 6X72 (BAG) ×2 IMPLANT
SLEEVE REPOSITIONING LENGTH 30 (MISCELLANEOUS) ×2 IMPLANT
STENT SYNERGY DES 2.5X16 (Permanent Stent) ×2 IMPLANT
TRANSDUCER W/STOPCOCK (MISCELLANEOUS) ×2 IMPLANT
TUBING CIL FLEX 10 FLL-RA (TUBING) ×2 IMPLANT
WIRE MARVEL STR TIP 190CM (WIRE) ×2 IMPLANT
WIRE MINAMO 190 (WIRE) ×2 IMPLANT

## 2018-08-10 NOTE — Interval H&P Note (Signed)
History and Physical Interval Note:  08/10/2018 7:18 AM  [name]  has presented today for surgery, with the diagnosis of nSTEMI.  [facility] patient update  Fluid balance is only net negative approximately 1L.  She is still significantly volume overloaded.  We are having a hard time diuresing enough to be able to successfully extubate.  I suspect that this is due to low output.  BP is low but MAP >60 off pressors.  We will start a lasix drip and give a dose of metolazone.  Plan for [name] tomorrow while intubated.  This will help guide therapy.  I suspect she will benefit from inotrope and will require a PICC.  Jean C. Oval Linsey, MD, [name] 08/09/2018 6:13 PM    The various methods of treatment have been discussed with the patient and family. After consideration of risks, benefits and other options for treatment, the patient has consented to  Procedure(s): RIGHT/LEFT HEART CATH AND CORONARY ANGIOGRAPHY (N/A) with possible PERCUTANEOUS CORONARY INTERVENTION as a surgical intervention .  The patient's history has been reviewed, patient examined, no change in status, stable for surgery.  I have reviewed the patient's chart and labs.  Questions were answered to the patient's satisfaction.    Cath Lab Visit (complete for each Cath Lab visit)  Clinical Evaluation Leading to the Procedure:   ACS: Yes.    Non-ACS:    Anginal Classification: CCS IV  Anti-ischemic medical therapy: No Therapy - ON PRESSORS.  Non-Invasive Test Results: High-risk stress test findings: cardiac mortality >3%/year; ECHO EF 20-25%  Prior CABG: No previous CABG   [name]

## 2018-08-10 NOTE — Progress Notes (Signed)
RT NOTE:  Transported pt from cath lab to 2H10 on vent.

## 2018-08-10 NOTE — Plan of Care (Signed)
Pt diuresing well on lasix drip;  Will continue to assess cxr results.  RR on vent WNL

## 2018-08-10 NOTE — Progress Notes (Signed)
Patient received a total of 175mcg Fentanyl Bolus delivered via Fentanyl Infusion. Unable to document in [facility] due to software restraints in the [facility]. Reported off to [employer] RN unit 2 Heart

## 2018-08-10 NOTE — Consult Note (Addendum)
Advanced Heart Failure Team Consult Note   Primary Physician: [name], MD PCP-Cardiologist:  No primary care provider on file.  Reason for Consultation: Heart Failure   HPI:    [name] is seen today for evaluation of heart failure at the request of Dr Oval Linsey.  [name] is a 68 year old with a history of DMII, HTN, and smoker admitted with increased shortness of breath and chest pain. Troponin 7.5>6.5>6.3 and BNP 1535. CXR was concerning for pulmonary edema so IV lasix was given. Taken to the cath lab on 2/10 but this was aborted due to PEA arrest. CPR started and epi was given with ROSC in 6-8 minutes. Due to failure to protect airway she was intubated by PCCM.   ECHO performed and showed reduced LVEF 20-25% and normal RV. She has been diuresing with IV lasix with sluggish urine output. Returned to the cath lab today and had DES to LAD. RHC showed pulmonary htn and low output. Remains intubated. CXR today with bilateral infiltrates.   Follows commands on the vent. Denies chest pain.   Swan #s  PAP 68/36 (50) PAPi 3.2  PVR 4.6  CO 3.08  CI 1.8  CO-OX 42%  RHC 08/10/18  LAD 80%---> DES LAD    RA 10  PA 60/28 (43) PCWP 29   Echo 08/06/2018 - EF 25% , RV normal mild TR, and severe HK.   Review of Systems: [y] = yes, '[ ]'$  = no  Patient is encephalopathic and or intubated. Therefore history has been obtained from chart review.   . General: Weight gain '[ ]'$ ; Weight loss '[ ]'$ ; Anorexia '[ ]'$ ; Fatigue '[ ]'$ ; Fever '[ ]'$ ; Chills '[ ]'$ ; Weakness '[ ]'$   . Cardiac: Chest pain/pressure '[ ]'$ ; Resting SOB '[ ]'$ ; Exertional SOB '[ ]'$ ; Orthopnea '[ ]'$ ; Pedal Edema '[ ]'$ ; Palpitations '[ ]'$ ; Syncope '[ ]'$ ; Presyncope '[ ]'$ ; Paroxysmal nocturnal dyspnea'[ ]'$   . Pulmonary: Cough '[ ]'$ ; Wheezing'[ ]'$ ; Hemoptysis'[ ]'$ ; Sputum '[ ]'$ ; Snoring '[ ]'$   . GI: Vomiting'[ ]'$ ; Dysphagia'[ ]'$ ; Melena'[ ]'$ ; Hematochezia '[ ]'$ ; Heartburn'[ ]'$ ; Abdominal pain '[ ]'$ ; Constipation '[ ]'$ ; Diarrhea '[ ]'$ ; BRBPR '[ ]'$   . GU: Hematuria'[ ]'$ ; Dysuria '[ ]'$ ;  Nocturia'[ ]'$   . Vascular: Pain in legs with walking '[ ]'$ ; Pain in feet with lying flat '[ ]'$ ; Non-healing sores '[ ]'$ ; Stroke '[ ]'$ ; TIA '[ ]'$ ; Slurred speech '[ ]'$ ;  . Neuro: Headaches'[ ]'$ ; Vertigo'[ ]'$ ; Seizures'[ ]'$ ; Paresthesias'[ ]'$ ;Blurred vision '[ ]'$ ; Diplopia '[ ]'$ ; Vision changes '[ ]'$   . Ortho/Skin: Arthritis '[ ]'$ ; Joint pain '[ ]'$ ; Muscle pain '[ ]'$ ; Joint swelling '[ ]'$ ; Back Pain '[ ]'$ ; Rash '[ ]'$   . Psych: Depression'[ ]'$ ; Anxiety'[ ]'$   . Heme: Bleeding problems '[ ]'$ ; Clotting disorders '[ ]'$ ; Anemia '[ ]'$   . Endocrine: Diabetes '[ ]'$ ; Thyroid dysfunction'[ ]'$   Home Medications Prior to Admission medications   Medication Sig Start Date End Date Taking? Authorizing Provider  hydrochlorothiazide (MICROZIDE) 12.5 MG capsule Take 12.5 mg by mouth daily.   Yes [provider]  Insulin Glargine, 2 Unit Dial, (TOUJEO MAX SOLOSTAR) 300 UNIT/ML SOPN Inject 50 Units into the skin at bedtime.   Yes [provider]  metFORMIN (GLUCOPHAGE) 1000 MG tablet Take 1,000 mg by mouth 2 (two) times daily with a meal.   Yes [provider]    Past Medical History: Past Medical History:  Diagnosis Date  . Diabetes mellitus without complication ([place])   .  Hypertension     Past Surgical History: History reviewed. No pertinent surgical history.  Family History: Family History  Problem Relation Age of Onset  . Breast cancer Neg Hx     Social History: Social History   Socioeconomic History  . Marital status: Married    Spouse name: Not on file  . Number of children: Not on file  . Years of education: Not on file  . Highest education level: Not on file  Occupational History  . Not on file  Social Needs  . Financial resource strain: Not on file  . Food insecurity:    Worry: Not on file    Inability: Not on file  . Transportation needs:    Medical: Not on file    Non-medical: Not on file  Tobacco Use  . Smoking status: Current Every Day Smoker    Packs/day: 1.00  . Smokeless tobacco: Never Used    Substance and Sexual Activity  . Alcohol use: Yes    Frequency: Never    Comment: 2-3 drinks per week   . Drug use: Never  . Sexual activity: Not on file  Lifestyle  . Physical activity:    Days per week: Not on file    Minutes per session: Not on file  . Stress: Not on file  Relationships  . Social connections:    Talks on phone: Not on file    Gets together: Not on file    Attends religious service: Not on file    Active member of club or organization: Not on file    Attends meetings of clubs or organizations: Not on file    Relationship status: Not on file  Other Topics Concern  . Not on file  Social History Narrative  . Not on file    Allergies:  No Known Allergies  Objective:    Vital Signs:   Temp:  [97.5 F (36.4 C)-99 F (37.2 C)] 97.5 F (36.4 C) (02/13 1100) Pulse Rate:  [33-110] 98 (02/13 1005) Resp:  [14-29] 19 (02/13 1100) BP: (93-141)/(62-91) 108/78 (02/13 1100) SpO2:  [0 %-97 %] 96 % (02/13 1005) FiO2 (%):  [40 %-80 %] 80 % (02/13 1005) Weight:  [65.8 kg] 65.8 kg (02/13 0409) Last BM Date: 08/06/18  Weight change: Filed Weights   08/08/18 0500 08/09/18 0500 08/10/18 0409  Weight: 65 kg 65.8 kg 65.8 kg    Intake/Output:   Intake/Output Summary (Last 24 hours) at 08/10/2018 1139 Last data filed at 08/10/2018 1033 Gross per 24 hour  Intake 1012.67 ml  Output 2000 ml  Net -987.33 ml      Physical Exam    General:  Intubated.  HEENT: ETT RIJ swan  Neck: supple. JVP 10  Carotids 2+ bilat; no bruits. No lymphadenopathy or thyromegaly appreciated. Cor: PMI nondisplaced. Regular rate & rhythm. No rubs, gallops or murmurs. Lungs: coarse throughout Abdomen: soft, nontender, nondistended. No hepatosplenomegaly. No bruits or masses. Good bowel sounds. Extremities: no cyanosis, clubbing, rash, edema. Toes dusky  Neuro: Intubated follows commands.    Telemetry  Sinus Tach 110s   EKG    N/a   Labs   Basic Metabolic Panel: Recent Labs   Lab 08/07/18 0239  08/08/18 0457 08/08/18 1554 08/09/18 0547 08/09/18 1900 08/10/18 0228 08/10/18 0340  NA 130*   < > 136  --  135 137 135 137  K 3.6   < > 4.8  --  3.8 3.3* 4.3 4.3  CL 92*  --  102  --  100 100  --  97*  CO2 20*  --  25  --  27 24  --  25  GLUCOSE 357*  --  155*  --  152* 163*  --  153*  BUN 13  --  19  --  25* 28*  --  28*  CREATININE 1.16*  --  1.23*  --  1.31* 1.35*  --  1.32*  CALCIUM 7.9*  --  8.5*  --  8.1* 8.5*  --  8.7*  MG 1.5*  --  1.5* 1.7 1.4* 1.6*  --  1.7  PHOS 3.5  --  1.1* 1.5* 1.8* 2.8  --  3.2   < > = values in this interval not displayed.    Liver Function Tests: Recent Labs  Lab 08/06/18 0623 08/07/18 0239  AST 22 24  ALT 12 15  ALKPHOS 83 30*  BILITOT 1.4* 0.8  PROT 7.0 7.5  ALBUMIN 2.5* 2.6*   Recent Labs  Lab 08/06/18 0628  LIPASE 19   No results for input(s): AMMONIA in the last 168 hours.  CBC: Recent Labs  Lab 08/06/18 0628  08/07/18 0239  08/07/18 1016 08/08/18 0457 08/09/18 0547 08/10/18 0228 08/10/18 0340  WBC 7.6  --  13.1*  --   --  14.1* 11.7*  --  13.3*  NEUTROABS 4.8  --   --   --   --  10.8*  --   --   --   HGB 15.5*   < > 14.9   < > 11.9* 13.2 12.6 13.3 13.2  HCT 46.0   < > 45.9   < > 35.0* 39.6 38.5 39.0 41.3  MCV 85.8  --  87.1  --   --  85.3 87.1  --  86.9  PLT 325  --  315  --   --  329 321  --  327   < > = values in this interval not displayed.    Cardiac Enzymes: Recent Labs  Lab 08/06/18 0628 08/06/18 1148 08/06/18 1321  TROPONINI 7.53* 6.55* 6.29*    BNP: BNP (last 3 results) Recent Labs    08/06/18 0628  BNP 1,535.2*    ProBNP (last 3 results) No results for input(s): PROBNP in the last 8760 hours.   CBG: Recent Labs  Lab 08/09/18 1147 08/09/18 1621 08/09/18 2009 08/09/18 2329 08/10/18 0355  GLUCAP 189* 218* 113* 182* 144*    Coagulation Studies: No results for input(s): LABPROT, INR in the last 72 hours.   Imaging   Dg Chest Port 1 View  Result Date:  08/10/2018 CLINICAL DATA:  Respiratory failure EXAM: PORTABLE CHEST 1 VIEW COMPARISON:  08/09/2018 FINDINGS: Endotracheal tube is again seen and stable. Gastric catheter extends into the stomach. Cardiac shadow is enlarged but stable. Diffuse bilateral fluffy infiltrates are noted bilaterally stable from the prior exam. No bony abnormality noted. IMPRESSION: No change from the prior exam in bilateral infiltrates. Electronically Signed   By: Inez Catalina M.D.   On: 08/10/2018 02:45      Medications:     Current Medications: . aspirin  81 mg Per Tube Daily  . atorvastatin  40 mg Per Tube q1800  . chlorhexidine gluconate (MEDLINE KIT)  15 mL Mouth Rinse BID  . feeding supplement (VITAL HIGH PROTEIN)  1,000 mL Per Tube Q24H  . insulin aspart  0-15 Units Subcutaneous Q4H  . mouth rinse  15 mL Mouth Rinse 10 times per day  . metolazone  10 mg  Oral Once  . multivitamin  15 mL Oral Daily  . pantoprazole (PROTONIX) IV  40 mg Intravenous Q24H  . potassium chloride  40 mEq Per Tube TID  . senna  2 tablet Per Tube Daily  . sodium chloride flush  10-40 mL Intracatheter Q12H  . sodium chloride flush  3 mL Intravenous Q12H  . sodium chloride flush  3 mL Intravenous Q12H  . ticagrelor  90 mg Oral BID     Infusions: . sodium chloride Stopped (08/07/18 1831)  . sodium chloride 50 mL/hr at 08/10/18 1000  . sodium chloride    . fentaNYL infusion INTRAVENOUS 75 mcg/hr (08/10/18 0842)  . furosemide (LASIX) infusion 10 mg/hr (08/10/18 1123)       Patient Profile   [name] is a 68 year old with a history of DMII, HTN, and smoker admitted with increased shortness of breath and chest pain. Troponin 7.5>6.5>6.3 and BNP 1535. CXR was concerning for pulmonary edema so IV lasix was given. Taken to the cath lab on 2/10 but this was aborted due to PEA arrest.   Assessment/Plan   1. Acute Hypoxic Respiratory Failure--> PEA arrest  Intubated on 2/10 with CCM managing.  CXR with bilateral infiltrates.    2. PEA arrest ? Trigger hypoxia  2/10 Received epi/ CPR  3. Cardiogenic Shock in the setting of NSTEMI/PEA arrest  New --> Acute Systolic HF. NICM 80% not explain reduced EF. ECHO EF 20-25%  Index/CO-OX low. Add milrinone 0.25 mcg. Check CO-OX in am.  Add 0.125 mg digoxin.  Continue to diurese with lasix drip and give 5 mg metolazone today.  Add 12.5 mg spironolactone daily.  Check BMET in am.   4. Pulmonary Hypertension  No formal diagnosis of COPD but has long history of smoking.  Elevated PA pressures. With PVR 4.7  Start milrinone to lower PA pressures.   4. NSTEMI  08/10/18 LHC with DES to left main.  On statin, brillinta, asa  6. DMII On SS per primary  Hgb A 1 C 10.2     Length of Stay: 4  Amy Clegg, NP  08/10/2018, 11:39 AM  Advanced Heart Failure Team Pager [phone] (M-F; 7a - 4p)  Please contact [facility] for night-coverage after hours (4p -7a ) and weekends on [email or web address]  Agree with above.  68 y/o smoker with DM2 and HTN admitted with NSTEMI and HF. Initially taken to cath lab but developed PEA arrest and intubated. Echo with EF 20-25%. Remains intubated and underwent cath today. Cath with single vessel CAD with *)% mLAD lesion that appeared thrombotic and possibly dissected. Underwent DES to LAD. RHC show high filling pressures with low cardiac output.   On exam  Remains intubated appears uncomfortable and tachypneic JVP to ear Cor RRR Lungs + crackles Ab obese NT Ext 1+ edema   She remains criically ill with low output HF in the setting of recent NSTEMI and PEA arrest. Degree of LV dysfunction seems somewhat out of proportion to CAD. Will start milrinone for inotropic support and continue diuresis. Continue DAPT and tirate HF meds. CCM on board to help with vent wean when ready.   CRITICAL CARE Performed by: Glori Bickers  Total critical care time: 35 minutes  Critical care time was exclusive of separately billable procedures and treating  other patients.  Critical care was necessary to treat or prevent imminent or life-threatening deterioration.  Critical care was time spent personally by me (independent of midlevel providers or residents) on the following  activities: development of treatment plan with patient and/or surrogate as well as nursing, discussions with consultants, evaluation of patient's response to treatment, examination of patient, obtaining history from patient or surrogate, ordering and performing treatments and interventions, ordering and review of laboratory studies, ordering and review of radiographic studies, pulse oximetry and re-evaluation of patient's condition.    Glori Bickers, MD  7:30 PM

## 2018-08-10 NOTE — Progress Notes (Addendum)
NAME:  [name], MRN:  [id number], DOB:  June 17, 1951, LOS: 4 ADMISSION DATE:  08/06/2018,  CONSULTATION DATE: 08/07/2018 REFERRING MD: Cardiology, CHIEF COMPLAINT: Respiratory failure status post PEA cardiac arrest  Brief History   68 year old female with coronary artery disease status post PEA arrest on 08/07/2018  History of present illness   68 year old female with history of coronary disease, lifelong smoker, diabetes mellitus who was admitted 08/06/2018 with a non-ST elevation MI.  He was initially mated to the [facility].  She was scheduled for left heart catheterization on 08/07/2018.  While being placed on table she had a PEA arrest that lasted for 4 minutes of CPR and 1 round of epinephrine.  He had return of spontaneous circulation was awake alert but looked in acute discomfort.  She was not mentating well was not moving oxygen.  She had a profound acidosis and was transferred to the heart unit where she was urgently intubated and placed on full mechanical ventilatory support.  Heart catheterization has been postponed at this time.  Her CBG was noted to be greater than 200 in the Cath Lab.  Pulmonary critical care is asked to assume her care at this time.  Past Medical History  Coronary artery disease Hypertension Diabetes Tobacco abuse  Significant Hospital Events   08/07/2018 PEA arrest CPR for 4 minutes 1 round of epi with return of spontaneous circulation 2/13> cardiac cath, LAD stent   Consults:  08/06/2008 cardiology 08/07/2008 pulmonary critical care  Procedures:  08/07/2018 intubation>> 2/13> cardiac cath  Significant Diagnostic Tests:   CXR 2/12> interval increase in bilateral opacities, R>L  CXR 2/13> bilateral infiltrates, slightly improved from prior however still persistent pulm edema   Micro Data:  2/9 BCx NGTD 2/10 resp cx: no organisms seen  2/10- RVP: neg 2/10 MRSA: neg   Antimicrobials:  None  Interim history/subjective:   Increasing oxygen  requirements overnight Cath lab this morning. stent placed LAD   Objective   Blood pressure 105/76, pulse 97, temperature 98.1 F (36.7 C), resp. rate (!) 21, height 5\' 2"  (1.575 m), weight 65.8 kg, SpO2 90 %.    Vent Mode: PRVC FiO2 (%):  [40 %-70 %] 70 % Set Rate:  [20 bmp] 20 bmp Vt Set:  [400 mL] 400 mL PEEP:  [5 cmH20-8 cmH20] 8 cmH20 Pressure Support:  [10 cmH20] 10 cmH20 Plateau Pressure:  [17 cmH20-23 cmH20] 18 cmH20   Intake/Output Summary (Last 24 hours) at 08/10/2018 5188 Last data filed at 08/10/2018 0700 Gross per 24 hour  Intake 1262.11 ml  Output 2010 ml  Net -747.89 ml   Filed Weights   08/08/18 0500 08/09/18 0500 08/10/18 0409  Weight: 65 kg 65.8 kg 65.8 kg   Examination: General: Older adult female, intubated sedated NAD  HENT: NCAT, ETT secure, mmm, anicteric sclera  Lungs: bilateral rhonchi  Cardiovascular: RRR, no r/g/m,  Abdomen: Round abdomen, non-tender, bowel sounds x4  Extremities: Symmetrical bulk and tone, no pedal edema  Neuro: Sedated but awakens to voice, follows commands Skin: clean, dry, intact. Cool toes.    Resolved Hospital Problem list     Assessment & Plan:   Respiratory failure requiring mechanical ventilation -Vent dependent respiratory failure secondary to proximal status post PEA cardiac arrest.  Profound acidosis proportion to PCO2 of 67.  Lifelong tobacco abuse 2/13> persistent pulmonary edema on CXR  P Continue mechanical ventilatory support Wean PEEP/FiO2 for SpO2> 92 Wean vent after cardiac cath today  -CXR in AM if unable to extubate  today   -Lasix gtt per cardiology to end today   S/p PEA arrest  -Post 6 minutes PEA arrest requiring 4 minutes of CPR and 1 amp of epinephrine on 08/07/2018 while inCath Lab and cardiac cath was not completed at that time -resultant cardiogenic shock  Her coronary disease with recent chest pain and admission an elevated troponin 6.7 Hypotension post intubation History of  hypertension P -Cardiac cath today 2/13  -continue tele -holding antihypertensives in setting of hypotension -Off pressors at this time  -Heparin gtt per cardiology, pharmacy dose  Fluid overload P -Lasix gtt per cardiology to end today  -Strict I/O  -Follow BMP, lytes   Electrolyte abnormality -Hypomagnesemia -Hypophosphatemia -at risk hypokalemia given diuresis  P Monitor BMP. Mag phos Replete PRN   Diabetes Mellitus Sliding scale insulin  Malnutrition, at risk - NPO -Hoping to evaluate for possible extubation after cath today (2/13) if unable to extubate today consider starting EN     Best practice:  Diet: NPO  Pain/Anxiety/Delirium protocol (if indicated): Fentanyl  VAP protocol (if indicated): Yes DVT prophylaxis: Heparin gtt  GI prophylaxis: PPI Glucose control: Sliding scale insulin protocol Mobility: Bedrest Code Status: Full Family Communication: None at bedside at time of NP rounds 2/12 Disposition: Continue ICU level of care   Labs   CBC: Recent Labs  Lab 08/06/18 0628  08/07/18 0239  08/07/18 1016 08/08/18 0457 08/09/18 0547 08/10/18 0228 08/10/18 0340  WBC 7.6  --  13.1*  --   --  14.1* 11.7*  --  13.3*  NEUTROABS 4.8  --   --   --   --  10.8*  --   --   --   HGB 15.5*   < > 14.9   < > 11.9* 13.2 12.6 13.3 13.2  HCT 46.0   < > 45.9   < > 35.0* 39.6 38.5 39.0 41.3  MCV 85.8  --  87.1  --   --  85.3 87.1  --  86.9  PLT 325  --  315  --   --  329 321  --  327   < > = values in this interval not displayed.    Basic Metabolic Panel: Recent Labs  Lab 08/07/18 0239  08/08/18 0457 08/08/18 1554 08/09/18 0547 08/09/18 1900 08/10/18 0228 08/10/18 0340  NA 130*   < > 136  --  135 137 135 137  K 3.6   < > 4.8  --  3.8 3.3* 4.3 4.3  CL 92*  --  102  --  100 100  --  97*  CO2 20*  --  25  --  27 24  --  25  GLUCOSE 357*  --  155*  --  152* 163*  --  153*  BUN 13  --  19  --  25* 28*  --  28*  CREATININE 1.16*  --  1.23*  --  1.31* 1.35*   --  1.32*  CALCIUM 7.9*  --  8.5*  --  8.1* 8.5*  --  8.7*  MG 1.5*  --  1.5* 1.7 1.4* 1.6*  --  1.7  PHOS 3.5  --  1.1* 1.5* 1.8* 2.8  --  3.2   < > = values in this interval not displayed.   GFR: Estimated Creatinine Clearance: 35.8 mL/min (A) (by C-G formula based on SCr of 1.32 mg/dL (H)). Recent Labs  Lab 08/06/18 1148 08/06/18 1927 08/07/18 0018 08/07/18 0239 08/08/18 0457 08/09/18 0547 08/10/18  0340  WBC  --   --   --  13.1* 14.1* 11.7* 13.3*  LATICACIDVEN 4.1* 6.6* 4.3* 5.7*  --   --   --     Liver Function Tests: Recent Labs  Lab 08/06/18 0623 08/07/18 0239  AST 22 24  ALT 12 15  ALKPHOS 83 30*  BILITOT 1.4* 0.8  PROT 7.0 7.5  ALBUMIN 2.5* 2.6*   Recent Labs  Lab 08/06/18 0628  LIPASE 19   No results for input(s): AMMONIA in the last 168 hours.  ABG    Component Value Date/Time   PHART 7.410 08/10/2018 0228   PCO2ART 43.8 08/10/2018 0228   PO2ART 54.0 (L) 08/10/2018 0228   HCO3 27.8 08/10/2018 0228   TCO2 29 08/10/2018 0228   ACIDBASEDEF 6.0 (H) 08/07/2018 0912   O2SAT 88.0 08/10/2018 0228     Coagulation Profile: No results for input(s): INR, PROTIME in the last 168 hours.  Cardiac Enzymes: Recent Labs  Lab 08/06/18 0628 08/06/18 1148 08/06/18 1321  TROPONINI 7.53* 6.55* 6.29*    HbA1C: Hgb A1c MFr Bld  Date/Time Value Ref Range Status  08/06/2018 02:00 PM 10.2 (H) 4.8 - 5.6 % Final    Comment:    (NOTE) Pre diabetes:          5.7%-6.4% Diabetes:              >6.4% Glycemic control for   <7.0% adults with diabetes    CBG: Recent Labs  Lab 08/09/18 1147 08/09/18 1621 08/09/18 2009 08/09/18 2329 08/10/18 0355  GLUCAP 189* 218* 113* 182* 144*   Critical Care Time: 40 minutes   Eliseo Gum MSN, AGACNP-BC [place] 08/10/2018, 8:23 AM Pg [phone]  Attending Note:  68 year old female with pulmonary HTN and CAD who presents to PCCM with respiratory failure due to pulmonary edema.   Patient underwent a right heart cath and the PAP is 60 mmHg.  On exam, diffuse crackles noted.  Discussed with PCCM-NP.  I reviewed CXR myself, ETT ok, pulmonary edema noted.  KVO IVF after cath protocol.  Lasix 10 mg/hr x24 hours, zaroxolyn 10 mg PO x1.  Replace K.  Strict I/O.  Titrate O2 with target 40% and PEEP 5.  Will attempt extubation today but realistically in AM after diureses.  The patient is critically ill with multiple organ systems failure and requires high complexity decision making for assessment and support, frequent evaluation and titration of therapies, application of advanced monitoring technologies and extensive interpretation of multiple databases.   Critical Care Time devoted to patient care services described in this note is  33  Minutes. This time reflects time of care of this signee Dr Jennet Maduro. This critical care time does not reflect procedure time, or teaching time or supervisory time of PA/NP/Med student/Med Resident etc but could involve care discussion time.  Rush Farmer, M.D. [facility] Pulmonary/Critical Care Medicine. Pager: [phone]. After hours pager: [phone].

## 2018-08-10 NOTE — Progress Notes (Signed)
Peep was decreased per verbal order of MD.

## 2018-08-10 NOTE — Care Management (Addendum)
CM consult acknowledged; Brilinta benefits check sent and pending. [facility] will be able to provide patient with a free 30-day supply Mon-Fri. Thanks   [name] RN, BSN, NCM-BC, ACM-RN [phone]

## 2018-08-10 NOTE — Care Management (Signed)
#  3.  S/W  JUDY  @ [facility] SOLUTION RX # [phone]   TICAGRELOR: NONE FORMULARY  BRILINTA  90 MG BID COVER- YES CO-PAY- $ 3.90 TIER-  3 DRUG PRIOR APPROVAL- NO  DEDUCTIBLE: NOT MET LOWE INCOME SUBSIDY LEVEL 2  PREFERRED PHARMACY : ANY [facility]

## 2018-08-10 NOTE — Progress Notes (Signed)
AMy Clegg, NP notified of Lactic acie and co-ox results.

## 2018-08-10 NOTE — Progress Notes (Signed)
Peep increased per verbal order of cath lab MD

## 2018-08-11 ENCOUNTER — Inpatient Hospital Stay (HOSPITAL_COMMUNITY): Payer: Medicare HMO

## 2018-08-11 LAB — PHOSPHORUS: PHOSPHORUS: 3.3 mg/dL (ref 2.5–4.6)

## 2018-08-11 LAB — CBC
HCT: 42.2 % (ref 36.0–46.0)
Hemoglobin: 14 g/dL (ref 12.0–15.0)
MCH: 28.4 pg (ref 26.0–34.0)
MCHC: 33.2 g/dL (ref 30.0–36.0)
MCV: 85.6 fL (ref 80.0–100.0)
Platelets: 319 10*3/uL (ref 150–400)
RBC: 4.93 MIL/uL (ref 3.87–5.11)
RDW: 14.1 % (ref 11.5–15.5)
WBC: 14.6 10*3/uL — ABNORMAL HIGH (ref 4.0–10.5)
nRBC: 0.3 % — ABNORMAL HIGH (ref 0.0–0.2)

## 2018-08-11 LAB — BASIC METABOLIC PANEL
Anion gap: 15 (ref 5–15)
BUN: 30 mg/dL — ABNORMAL HIGH (ref 8–23)
CALCIUM: 9.3 mg/dL (ref 8.9–10.3)
CO2: 28 mmol/L (ref 22–32)
CREATININE: 1.43 mg/dL — AB (ref 0.44–1.00)
Chloride: 93 mmol/L — ABNORMAL LOW (ref 98–111)
GFR calc Af Amer: 43 mL/min — ABNORMAL LOW (ref >60)
GFR calc non Af Amer: 37 mL/min — ABNORMAL LOW (ref >60)
Glucose, Bld: 234 mg/dL — ABNORMAL HIGH (ref 70–99)
Potassium: 4 mmol/L (ref 3.5–5.1)
Sodium: 136 mmol/L (ref 135–145)

## 2018-08-11 LAB — COOXEMETRY PANEL
Carboxyhemoglobin: 1.1 % (ref 0.5–1.5)
Methemoglobin: 1.2 % (ref 0.0–1.5)
O2 Saturation: 69.3 %
TOTAL HEMOGLOBIN: 13.4 g/dL (ref 12.0–16.0)

## 2018-08-11 LAB — POCT I-STAT 7, (LYTES, BLD GAS, ICA,H+H)
Acid-Base Excess: 9 mmol/L — ABNORMAL HIGH (ref 0.0–2.0)
Bicarbonate: 32.1 mmol/L — ABNORMAL HIGH (ref 20.0–28.0)
Calcium, Ion: 1.2 mmol/L (ref 1.15–1.40)
HCT: 35 % — ABNORMAL LOW (ref 36.0–46.0)
Hemoglobin: 11.9 g/dL — ABNORMAL LOW (ref 12.0–15.0)
O2 Saturation: 92 %
Potassium: 3.5 mmol/L (ref 3.5–5.1)
Sodium: 132 mmol/L — ABNORMAL LOW (ref 135–145)
TCO2: 33 mmol/L — ABNORMAL HIGH (ref 22–32)
pCO2 arterial: 39.2 mmHg (ref 32.0–48.0)
pH, Arterial: 7.521 — ABNORMAL HIGH (ref 7.350–7.450)
pO2, Arterial: 57 mmHg — ABNORMAL LOW (ref 83.0–108.0)

## 2018-08-11 LAB — CULTURE, BLOOD (ROUTINE X 2)
Culture: NO GROWTH
Culture: NO GROWTH

## 2018-08-11 LAB — GLUCOSE, CAPILLARY
Glucose-Capillary: 118 mg/dL — ABNORMAL HIGH (ref 70–99)
Glucose-Capillary: 203 mg/dL — ABNORMAL HIGH (ref 70–99)
Glucose-Capillary: 230 mg/dL — ABNORMAL HIGH (ref 70–99)
Glucose-Capillary: 248 mg/dL — ABNORMAL HIGH (ref 70–99)
Glucose-Capillary: 253 mg/dL — ABNORMAL HIGH (ref 70–99)
Glucose-Capillary: 80 mg/dL (ref 70–99)

## 2018-08-11 LAB — MAGNESIUM: Magnesium: 1.9 mg/dL (ref 1.7–2.4)

## 2018-08-11 MED ORDER — CHLORHEXIDINE GLUCONATE CLOTH 2 % EX PADS
6.0000 | MEDICATED_PAD | Freq: Every day | CUTANEOUS | Status: DC
  Administered 2018-08-11 – 2018-08-12 (×2): 6 via TOPICAL

## 2018-08-11 MED ORDER — SODIUM CHLORIDE 0.9% FLUSH
10.0000 mL | Freq: Two times a day (BID) | INTRAVENOUS | Status: DC
  Administered 2018-08-12 (×2): 10 mL

## 2018-08-11 MED ORDER — SODIUM CHLORIDE 0.9% FLUSH: 10.0000 mL | INTRAVENOUS | Status: DC | PRN

## 2018-08-11 MED ORDER — TIROFIBAN HCL IV 12.5 MG/250 ML
0.0750 ug/kg/min | INTRAVENOUS | Status: DC
  Administered 2018-08-11: 0.075 ug/kg/min via INTRAVENOUS
  Filled 2018-08-11 (×2): qty 250

## 2018-08-11 MED ORDER — INSULIN ASPART 100 UNIT/ML ~~LOC~~ SOLN
0.0000 [IU] | SUBCUTANEOUS | Status: DC
  Administered 2018-08-11: 11 [IU] via SUBCUTANEOUS
  Administered 2018-08-11: 7 [IU] via SUBCUTANEOUS
  Administered 2018-08-12 (×3): 4 [IU] via SUBCUTANEOUS
  Administered 2018-08-12: 7 [IU] via SUBCUTANEOUS
  Administered 2018-08-13 – 2018-08-14 (×6): 4 [IU] via SUBCUTANEOUS
  Administered 2018-08-14: 3 [IU] via SUBCUTANEOUS
  Administered 2018-08-14 – 2018-08-15 (×3): 4 [IU] via SUBCUTANEOUS
  Administered 2018-08-15: 3 [IU] via SUBCUTANEOUS

## 2018-08-11 MED ORDER — LOSARTAN POTASSIUM 25 MG PO TABS
12.5000 mg | ORAL_TABLET | Freq: Every day | ORAL | Status: DC
  Administered 2018-08-12 – 2018-08-15 (×4): 12.5 mg via ORAL
  Filled 2018-08-11 (×4): qty 1

## 2018-08-11 MED ORDER — FUROSEMIDE 40 MG PO TABS
40.0000 mg | ORAL_TABLET | Freq: Every day | ORAL | Status: DC
  Administered 2018-08-11: 40 mg via ORAL
  Filled 2018-08-11 (×2): qty 1

## 2018-08-11 MED ORDER — SPIRONOLACTONE 25 MG PO TABS
25.0000 mg | ORAL_TABLET | Freq: Every day | ORAL | Status: DC
  Administered 2018-08-11 – 2018-08-17 (×7): 25 mg via ORAL
  Filled 2018-08-11 (×8): qty 1

## 2018-08-11 MED ORDER — SPIRONOLACTONE 25 MG PO TABS: 25.0000 mg | ORAL_TABLET | Freq: Every day | ORAL | Status: DC

## 2018-08-11 MED ORDER — MILRINONE LACTATE IN DEXTROSE 20-5 MG/100ML-% IV SOLN: 0.1250 ug/kg/min | INTRAVENOUS | Status: DC

## 2018-08-11 NOTE — Progress Notes (Signed)
RN walked into room, found pt self-extubated with significant other at bedside. Pt desaturated to mid 80s. Pt transitioned to NRB. Pt alert/oriented, hoarse speech, able to verbalize name & cough present. Order to place pt on bipap, then recheck ABG in 30 minutes. CCM at bedside assessing pt. Assessment ongoing.

## 2018-08-11 NOTE — Progress Notes (Addendum)
Advanced Heart Failure Rounding Note  PCP-Cardiologist: No primary care provider on file.   Subjective:    2/9 Admitted 2/9 with [place] and respiratory distress 2/10 PEA arrest 2/13 Underwent PCI of LAD 2/13 self-extubated  Yesterday milrinone 0.25 mcg added and she continued to diurese with IV lasix + metolazone.  Brisk diuresis noted. Negative 3 liters.   Accidental self extubation last night. Now on Bipap. Tolerating ok so far. Co-ox 69%  Denies chest pain.   Swan #s CVP 3 27/18 (20) CO 3.9 CI 2.3   RHC 08/10/18  LAD 80%---> DES LAD    RA 10  PA 60/28 (43) PCWP 29  CI 1.8   Echo 08/06/2018 - EF 25% , RV normal mild TR, and severe HK.   Objective:   Weight Range: 65.9 kg Body mass index is 26.57 kg/m.   Vital Signs:   Temp:  [97 F (36.1 C)-98.2 F (36.8 C)] 97.7 F (36.5 C) (02/14 0700) Pulse Rate:  [33-117] 108 (02/14 0700) Resp:  [14-29] 27 (02/14 0700) BP: (76-145)/(50-93) 114/64 (02/14 0700) SpO2:  [0 %-99 %] 96 % (02/14 0700) FiO2 (%):  [40 %-80 %] 80 % (02/14 0434) Weight:  [65.9 kg] 65.9 kg (02/14 0500) Last BM Date: 08/06/18  Weight change: Filed Weights   08/09/18 0500 08/10/18 0409 08/11/18 0500  Weight: 65.8 kg 65.8 kg 65.9 kg    Intake/Output:   Intake/Output Summary (Last 24 hours) at 08/11/2018 0725 Last data filed at 08/11/2018 0500 Gross per 24 hour  Intake 936.29 ml  Output 3980 ml  Net -3043.71 ml      Physical Exam    CVP 3 personally checked  General: On bipap. NAD  HEENT: On bipap. RIJ swan  Neck: Supple. JVP flat . Carotids 2+ bilat; no bruits. No lymphadenopathy or thyromegaly appreciated. Cor: PMI nondisplaced. Regular rate & rhythm. No rubs, gallops or murmurs. Lungs: Coarse  Abdomen: Soft, nontender, nondistended. No hepatosplenomegaly. No bruits or masses. Good bowel sounds. Extremities: No cyanosis, clubbing, rash, R and LLE complression stockings.  Neuro: On Bipap. Follows commands. MAE   Telemetry    Sinus Tach 100-110 Personally reviewed    EKG    N/a   Labs    CBC Recent Labs    08/10/18 0340  08/11/18 0429 08/11/18 0600  WBC 13.3*  --   --  14.6*  HGB 13.2   < > 11.9* 14.0  HCT 41.3   < > 35.0* 42.2  MCV 86.9  --   --  85.6  PLT 327  --   --  319   < > = values in this interval not displayed.   Basic Metabolic Panel Recent Labs    08/10/18 0340  08/10/18 1902 08/11/18 0429 08/11/18 0600  NA 137   < >  --  132* 136  K 4.3   < >  --  3.5 4.0  CL 97*  --   --   --  93*  CO2 25  --   --   --  28  GLUCOSE 153*  --   --   --  234*  BUN 28*  --   --   --  30*  CREATININE 1.32*  --   --   --  1.43*  CALCIUM 8.7*  --   --   --  9.3  MG 1.7  --  2.1  --  1.9  PHOS 3.2  --  2.5  --  3.3   < > =  values in this interval not displayed.   Liver Function Tests No results for input(s): AST, ALT, ALKPHOS, BILITOT, PROT, ALBUMIN in the last 72 hours. No results for input(s): LIPASE, AMYLASE in the last 72 hours. Cardiac Enzymes No results for input(s): CKTOTAL, CKMB, CKMBINDEX, TROPONINI in the last 72 hours.  BNP: BNP (last 3 results) Recent Labs    08/06/18 0628  BNP 1,535.2*    ProBNP (last 3 results) No results for input(s): PROBNP in the last 8760 hours.   D-Dimer No results for input(s): DDIMER in the last 72 hours. Hemoglobin A1C No results for input(s): HGBA1C in the last 72 hours. Fasting Lipid Panel No results for input(s): CHOL, HDL, LDLCALC, TRIG, CHOLHDL, LDLDIRECT in the last 72 hours. Thyroid Function Tests No results for input(s): TSH, T4TOTAL, T3FREE, THYROIDAB in the last 72 hours.  Invalid input(s): FREET3  Other results:   Imaging    Dg Chest Port 1 View  Result Date: 08/10/2018 CLINICAL DATA:  Patient status post central line placement. EXAM: PORTABLE CHEST 1 VIEW COMPARISON:  Chest radiograph 08/10/2018 FINDINGS: Interval insertion right IJ approach pulmonary arterial catheter with tip projecting in the mid aspect of the  right pulmonary artery. ET tube mid trachea. Enteric tube courses inferior to the diaphragm. Monitoring leads overlie the patient. Stable cardiac and mediastinal contours. Persistent diffuse bilateral airspace opacities. Small bilateral pleural effusions. IMPRESSION: Interval insertion PA catheter with tip projecting at the mid right pulmonary artery. Re demonstrated bilateral consolidative opacities. Electronically Signed   By: Lovey Newcomer M.D.   On: 08/10/2018 13:22      Medications:     Scheduled Medications: . aspirin  81 mg Per Tube Daily  . atorvastatin  40 mg Per Tube q1800  . chlorhexidine gluconate (MEDLINE KIT)  15 mL Mouth Rinse BID  . digoxin  0.125 mg Oral Daily  . feeding supplement (VITAL HIGH PROTEIN)  1,000 mL Per Tube Q24H  . fluconazole  200 mg Per Tube Daily  . heparin injection (subcutaneous)  5,000 Units Subcutaneous Q8H  . insulin aspart  0-15 Units Subcutaneous Q4H  . multivitamin  15 mL Oral Daily  . pantoprazole (PROTONIX) IV  40 mg Intravenous Q24H  . senna  2 tablet Per Tube Daily  . sodium chloride flush  10-40 mL Intracatheter Q12H  . sodium chloride flush  3 mL Intravenous Q12H  . sodium chloride flush  3 mL Intravenous Q12H  . spironolactone  12.5 mg Oral QHS  . ticagrelor  90 mg Oral BID     Infusions: . sodium chloride Stopped (08/07/18 1831)  . sodium chloride    . fentaNYL infusion INTRAVENOUS 75 mcg/hr (08/10/18 0842)  . furosemide (LASIX) infusion 10 mg/hr (08/11/18 0500)  . milrinone 0.25 mcg/kg/min (08/11/18 0500)     PRN Medications:  sodium chloride, sodium chloride, acetaminophen **OR** acetaminophen, albuterol, bisacodyl, docusate, fentaNYL (SUBLIMAZE) injection, fentaNYL (SUBLIMAZE) injection, midazolam, midazolam, ondansetron **OR** ondansetron (ZOFRAN) IV, sodium chloride flush, sodium chloride flush, sodium chloride flush    Patient Profile   [name] is a 68 year old with a history of DMII, HTN, and smoker admitted with  increased shortness of breath and chest pain. Troponin 7.5>6.5>6.3 and BNP 1535. CXR was concerning for pulmonary edema so IV lasix was given. Taken to the cath lab on 2/10 but this was aborted due to PEA arrest. Assessment/Plan   1. Acute Hypoxic Respiratory Failure--> PEA arrest  - Intubated on 2/10 with CCM managing. Self extubated overnight. On Bipap  with stable sats.  - CXR with diffuse bilateral infiltrates this am but CVP (3) and wedge low (11-12) - Will keep lungs dry and follow CXR. ? Component of ARDS. CCM following  2. PEA arrest on 2/10  - due to hypoxia in setting of severe COPD and CHF  3. Acute systolic HR -> Cardiogenic Shock in the setting of NSTEMI/PEA arrest  - Suspect mixed ICM. NICM. S/p PCI  80% LAD on 12/15. ECHO 2/9 EF 20-25%  - Initial hemodynamics c/w shock. Now improving with milrinone 0.25. Co-ox 69%. Can wean to 0.125 - Swan numbers much improved but CXR still with diffuse edema - With CVP 3 and PCWP 11-12. Hold IV lasix. Start '40mg'$  po bid  - Continue 0.125 mg digoxin.  - Increase spiro to 25 mg daily.   - Add 12.5 mg losartan Hold off on entresto with low BP - No b-blocker with shock - Check BMET in am.  - Keep swan one more day. Can pull in am  4. Pulmonary Hypertension  - Mostly pulmonary venous HTN.  PVR 4.7 No formal diagnosis of COPD but has long history of smoking.  - PA pressures much improved with milrinone   4. NSTEMI  - peak troponin 7.5 - 08/10/18 LHC with DES to 80% mLAD - On statin, brillinta, asa  5. DMII -On SS per primary  - Hgb A 1 C 10.2    CRITICAL CARE Performed by: Glori Bickers  Total critical care time: 35 minutes  Critical care time was exclusive of separately billable procedures and treating other patients.  Critical care was necessary to treat or prevent imminent or life-threatening deterioration.  Critical care was time spent personally by me (independent of midlevel providers or residents) on the following  activities: development of treatment plan with patient and/or surrogate as well as nursing, discussions with consultants, evaluation of patient's response to treatment, examination of patient, obtaining history from patient or surrogate, ordering and performing treatments and interventions, ordering and review of laboratory studies, ordering and review of radiographic studies, pulse oximetry and re-evaluation of patient's condition.     Length of Stay: 5  Glori Bickers, MD  8:59 AM Advanced Heart Failure Team Pager [phone] (M-F; 7a - 4p)  Please contact [facility] for night-coverage after hours (4p -7a ) and weekends on [email or web address]  Addendum:  Failed swallow study. Will start aggrastat for recent stent.   Glori Bickers, MD  8:06 PM

## 2018-08-11 NOTE — Progress Notes (Signed)
eLink Physician-Brief Progress Note Patient Name: [name] DOB: [birth year] MRN: [id number]   Date of Service  08/11/2018  HPI/Events of Note  Camera: Self extubated, on NRB. Family ay bed side. VS stable. Protecting airways. PEA arrest. Cardiogenic shock.  eICU Interventions  Change to BiPAP 06/02/11/50% with asp precautions. To reduce pre and after load for cardiac pumping.  Follow ABG in 30 mins     Intervention Category Major Interventions: Respiratory failure - evaluation and management;Other:  Elmer Sow 08/11/2018, 3:39 AM

## 2018-08-11 NOTE — Evaluation (Signed)
Clinical/Bedside Swallow Evaluation Patient Details  Name: [name] MRN: [id number] Date of Birth: 03/06/1951  Today's Date: 08/11/2018 Time: SLP Start Time (ACUTE ONLY): 1414 SLP Stop Time (ACUTE ONLY): 1425 SLP Time Calculation (min) (ACUTE ONLY): 11 min  Past Medical History:  Past Medical History:  Diagnosis Date  . Diabetes mellitus without complication ([place])   . Hypertension    Past Surgical History:  Past Surgical History:  Procedure Laterality Date  . CORONARY STENT INTERVENTION N/A 08/10/2018   Procedure: CORONARY STENT INTERVENTION;  Surgeon: Leonie Man, MD;  Location: [facility];  Service: Cardiovascular;  Laterality: N/A;  . RIGHT/LEFT HEART CATH AND CORONARY ANGIOGRAPHY N/A 08/10/2018   Procedure: RIGHT/LEFT HEART CATH AND CORONARY ANGIOGRAPHY;  Surgeon: Leonie Man, MD;  Location: [facility];  Service: Cardiovascular;  Laterality: N/A;   HPI:  Pt is a 68 yo female admitted 2/9 with NSTEMI. PEA arrest on 2/10. Pt was intubated on 2/10, self-extubated 2/14. Cardiac cath 2/13. PMH: CAD, HTN, DM, tobacco abuse   Assessment / Plan / Recommendation Clinical Impression  Pt has signs of a post-extubation dysphagia, including significant dysphonia (primarily aphonic), weak appearing cough, and consistent coughing that follows trials of small, single ice chips. Her RR fluctuates, reaching upward into the low 30s; SpO2 remains stable in the low 90s. Recommend additional time post-extubation before starting a PO diet, particularly in light of self-extubation. Could offer a few, single ice chips from RN after oral care to facilitate moistening of the oral/pharyngeal mucosa as well as use of swallowing musculature. SLP will f/u for PO readiness versus need for instrumental testing. SLP Visit Diagnosis: Dysphagia, unspecified (R13.10)    Aspiration Risk  Moderate aspiration risk    Diet Recommendation NPO   Medication Administration: Via alternative means     Other  Recommendations Oral Care Recommendations: Oral care QID;Oral care prior to ice chip/H20 Other Recommendations: Have oral suction available   Follow up Recommendations (tba)      Frequency and Duration min 2x/week  2 weeks       Prognosis Prognosis for Safe Diet Advancement: Good      Swallow Study   General HPI: Pt is a 68 yo female admitted 2/9 with NSTEMI. PEA arrest on 2/10. Pt was intubated on 2/10, self-extubated 2/14. Cardiac cath 2/13. PMH: CAD, HTN, DM, tobacco abuse Type of Study: Bedside Swallow Evaluation Previous Swallow Assessment: none in chart Diet Prior to this Study: NPO Temperature Spikes Noted: No Respiratory Status: Nasal cannula History of Recent Intubation: Yes Length of Intubations (days): 4 days Date extubated: 08/11/18 Behavior/Cognition: Alert;Cooperative;Pleasant mood Oral Cavity Assessment: Within Functional Limits Oral Care Completed by SLP: Yes Oral Cavity - Dentition: Edentulous Self-Feeding Abilities: Needs assist Patient Positioning: Upright in bed Baseline Vocal Quality: Aphonic(produces brief, dysphonic vocalizations when cued) Volitional Cough: Weak Volitional Swallow: Able to elicit    Oral/Motor/Sensory Function Overall Oral Motor/Sensory Function: Generalized oral weakness   Ice Chips Ice chips: Impaired Presentation: Spoon Pharyngeal Phase Impairments: Cough - Immediate;Cough - Delayed   Thin Liquid Thin Liquid: Not tested    Nectar Thick Nectar Thick Liquid: Not tested   Honey Thick Honey Thick Liquid: Not tested   Puree Puree: Not tested   Solid     Solid: Not tested      [name] 08/11/2018,2:37 PM   [name], M.A. [facility] Environmental education officer [phone] Office [phone]

## 2018-08-11 NOTE — Progress Notes (Signed)
NAME:  [name], MRN:  [id number], DOB:  [birth year], LOS: 5 ADMISSION DATE:  08/06/2018,  CONSULTATION DATE: 08/07/2018 REFERRING MD: Cardiology, CHIEF COMPLAINT: Respiratory failure status post PEA cardiac arrest  Brief History   68 year old female with coronary artery disease status post PEA arrest on 08/07/2018  History of present illness   68 year old female with history of coronary disease, lifelong smoker, diabetes mellitus who was admitted 08/06/2018 with a non-ST elevation MI.  He was initially mated to the [facility].  She was scheduled for left heart catheterization on 08/07/2018.  While being placed on table she had a PEA arrest that lasted for 4 minutes of CPR and 1 round of epinephrine.  He had return of spontaneous circulation was awake alert but looked in acute discomfort.  She was not mentating well was not moving oxygen.  She had a profound acidosis and was transferred to the heart unit where she was urgently intubated and placed on full mechanical ventilatory support.  Heart catheterization has been postponed at this time.  Her CBG was noted to be greater than 200 in the Cath Lab.  Pulmonary critical care is asked to assume her care at this time.  Past Medical History  Coronary artery disease Hypertension Diabetes Tobacco abuse  Significant Hospital Events   08/07/2018 PEA arrest CPR for 4 minutes 1 round of epi with return of spontaneous circulation 2/13> cardiac cath, LAD stent   Consults:  08/06/2008 cardiology 08/07/2008 pulmonary critical care  Procedures:  08/07/2018 intubation>> 2/13> cardiac cath  Significant Diagnostic Tests:   CXR 2/12> interval increase in bilateral opacities, R>L  CXR 2/13> bilateral infiltrates, slightly improved from prior however still persistent pulm edema   Micro Data:  2/9 BCx NGTD 2/10 resp cx: no organisms seen  2/10- RVP: neg 2/10 MRSA: neg   Antimicrobials:  None  Interim history/subjective:   Self extubated overnight  and now is on BiPAP  Objective   Blood pressure 125/60, pulse (!) 105, temperature (!) 97.2 F (36.2 C), resp. rate 17, height 5\' 2"  (1.575 m), weight 64.6 kg, SpO2 96 %. PAP: (18-57)/(13-32) 29/21 CVP:  [0 mmHg-10 mmHg] 4 mmHg CO:  [4.2 L/min] 4.2 L/min CI:  [2.4 L/min/m2] 2.4 L/min/m2  Vent Mode: CPAP;BIPAP;PCV FiO2 (%):  [40 %-80 %] 70 % Set Rate:  [12 bmp-20 bmp] 12 bmp Vt Set:  [400 mL] 400 mL PEEP:  [5 cmH20-8 cmH20] 5 cmH20 Pressure Support:  [10 cmH20] 10 cmH20 Plateau Pressure:  [17 cmH20-23 cmH20] 22 cmH20   Intake/Output Summary (Last 24 hours) at 08/11/2018 1225 Last data filed at 08/11/2018 1200 Gross per 24 hour  Intake 805.19 ml  Output 4460 ml  Net -3654.81 ml   Filed Weights   08/10/18 0409 08/11/18 0500 08/11/18 0739  Weight: 65.8 kg 65.9 kg 64.6 kg   Examination: General: Older than stated age adult female HENT: Mutual/AT, PERRL, EOM-I and MMM Lungs: Bibasilar crackles noted Cardiovascular: RRR, Nl S1/S2 and -M/R/G Abdomen: Soft, NT, ND and +BS  Extremities: Symmetrical bulk and tone, no pedal edema  Neuro: Alert, slow to respond but following command Skin: clean, dry, intact. Cool toes.   Resolved Hospital Problem list     Assessment & Plan:   Respiratory failure requiring mechanical ventilation -Vent dependent respiratory failure secondary to proximal status post PEA cardiac arrest.  Profound acidosis proportion to PCO2 of 67.  Lifelong tobacco abuse 2/13> persistent pulmonary edema on CXR  P Continue mechanical ventilatory support Wean PEEP/FiO2 for SpO2> 92  Wean vent after cardiac cath today  - CXR in AM if unable to extubate today   - Lasix per cards at this point  S/p PEA arrest  -Post 6 minutes PEA arrest requiring 4 minutes of CPR and 1 amp of epinephrine on 08/07/2018 while inCath Lab and cardiac cath was not completed at that time -resultant cardiogenic shock  Her coronary disease with recent chest pain and admission an elevated troponin  6.7 Hypotension post intubation History of hypertension P - Cardiac cath today 2/13  - Continue tele monitoring - Holding antihypertensives in setting of hypotension - Off pressors at this time  - Heparin gtt per cardiology, pharmacy dose - Milrinone for BP support  Fluid overload P - Lasix gtt per cardiology to end today  - Strict I/O  - Follow BMP, lytes   Electrolyte abnormality -Hypomagnesemia -Hypophosphatemia -at risk hypokalemia given diuresis  P - Monitor BMP. Mag phos - Replete PRN   Diabetes Mellitus - Sliding scale insulin  Malnutrition, at risk - NPO - Will need a swallow evaluation when off BiPAP  Best practice:  Diet: NPO  Pain/Anxiety/Delirium protocol (if indicated): Fentanyl  VAP protocol (if indicated): Yes DVT prophylaxis: Heparin gtt  GI prophylaxis: PPI Glucose control: Sliding scale insulin protocol Mobility: Bedrest Code Status: Full Family Communication: None at bedside at time of NP rounds 2/12 Disposition: Continue ICU level of care   Labs   CBC: Recent Labs  Lab 08/06/18 0628  08/07/18 0239  08/08/18 0457 08/09/18 0547  08/10/18 0340  08/10/18 0811 08/10/18 0858 08/10/18 0911 08/11/18 0429 08/11/18 0600  WBC 7.6  --  13.1*  --  14.1* 11.7*  --  13.3*  --   --   --   --   --  14.6*  NEUTROABS 4.8  --   --   --  10.8*  --   --   --   --   --   --   --   --   --   HGB 15.5*   < > 14.9   < > 13.2 12.6   < > 13.2   < > 12.6 13.6 11.9* 11.9* 14.0  HCT 46.0   < > 45.9   < > 39.6 38.5   < > 41.3   < > 37.0 40.0 35.0* 35.0* 42.2  MCV 85.8  --  87.1  --  85.3 87.1  --  86.9  --   --   --   --   --  85.6  PLT 325  --  315  --  329 321  --  327  --   --   --   --   --  319   < > = values in this interval not displayed.    Basic Metabolic Panel: Recent Labs  Lab 08/08/18 0457  08/09/18 0547 08/09/18 1900  08/10/18 0340  08/10/18 0811 08/10/18 0858 08/10/18 0911 08/10/18 1902 08/11/18 0429 08/11/18 0600  NA 136  --  135 137    < > 137   < > 135 113* 134*  --  132* 136  K 4.8  --  3.8 3.3*   < > 4.3   < > 4.0 3.4* 4.0  --  3.5 4.0  CL 102  --  100 100  --  97*  --   --   --   --   --   --  93*  CO2 25  --  27 24  --  25  --   --   --   --   --   --  28  GLUCOSE 155*  --  152* 163*  --  153*  --   --   --   --   --   --  234*  BUN 19  --  25* 28*  --  28*  --   --   --   --   --   --  30*  CREATININE 1.23*  --  1.31* 1.35*  --  1.32*  --   --   --   --   --   --  1.43*  CALCIUM 8.5*  --  8.1* 8.5*  --  8.7*  --   --   --   --   --   --  9.3  MG 1.5*   < > 1.4* 1.6*  --  1.7  --   --   --   --  2.1  --  1.9  PHOS 1.1*   < > 1.8* 2.8  --  3.2  --   --   --   --  2.5  --  3.3   < > = values in this interval not displayed.   GFR: Estimated Creatinine Clearance: 32.8 mL/min (A) (by C-G formula based on SCr of 1.43 mg/dL (H)). Recent Labs  Lab 08/07/18 0018 08/07/18 0239 08/08/18 0457 08/09/18 0547 08/10/18 0340 08/10/18 1332 08/10/18 2045 08/11/18 0600  WBC  --  13.1* 14.1* 11.7* 13.3*  --   --  14.6*  LATICACIDVEN 4.3* 5.7*  --   --   --  2.2* 1.7  --     Liver Function Tests: Recent Labs  Lab 08/06/18 0623 08/07/18 0239  AST 22 24  ALT 12 15  ALKPHOS 83 30*  BILITOT 1.4* 0.8  PROT 7.0 7.5  ALBUMIN 2.5* 2.6*   Recent Labs  Lab 08/06/18 0628  LIPASE 19   No results for input(s): AMMONIA in the last 168 hours.  ABG    Component Value Date/Time   PHART 7.521 (H) 08/11/2018 0429   PCO2ART 39.2 08/11/2018 0429   PO2ART 57.0 (L) 08/11/2018 0429   HCO3 32.1 (H) 08/11/2018 0429   TCO2 33 (H) 08/11/2018 0429   ACIDBASEDEF 8.0 (H) 08/10/2018 0858   O2SAT 69.3 08/11/2018 0800     Coagulation Profile: No results for input(s): INR, PROTIME in the last 168 hours.  Cardiac Enzymes: Recent Labs  Lab 08/06/18 0628 08/06/18 1148 08/06/18 1321  TROPONINI 7.53* 6.55* 6.29*    HbA1C: Hgb A1c MFr Bld  Date/Time Value Ref Range Status  08/06/2018 02:00 PM 10.2 (H) 4.8 - 5.6 % Final     Comment:    (NOTE) Pre diabetes:          5.7%-6.4% Diabetes:              >6.4% Glycemic control for   <7.0% adults with diabetes    CBG: Recent Labs  Lab 08/10/18 2002 08/11/18 0010 08/11/18 0604 08/11/18 0748 08/11/18 1146  GLUCAP 217* 253* 230* 248* 80   The patient is critically ill with multiple organ systems failure and requires high complexity decision making for assessment and support, frequent evaluation and titration of therapies, application of advanced monitoring technologies and extensive interpretation of multiple databases.   Critical Care Time devoted to patient care services described in this note is  32  Minutes. This time reflects time of care of this  signee Dr Jennet Maduro. This critical care time does not reflect procedure time, or teaching time or supervisory time of PA/NP/Med student/Med Resident etc but could involve care discussion time.  Rush Farmer, M.D. [facility] Pulmonary/Critical Care Medicine. Pager: [phone]. After hours pager: [phone].

## 2018-08-11 NOTE — Progress Notes (Addendum)
Nutrition Follow Up   DOCUMENTATION CODES:   Not applicable  INTERVENTION:    Await swallow evaluation per Speech Path  RD to follow for nutrition care plan  NUTRITION DIAGNOSIS:   Inadequate oral intake related to inability to eat as evidenced by NPO status, ongoing  GOAL:   Patient will meet greater than or equal to 90% of their needs, currently unmet  MONITOR:   Diet advancement, PO intake, Labs, Skin, Weight trends, I & O's  ASSESSMENT:   68 year old female who presented to the ED on 2/9 with N/V. PMH significant for T2DM requiring insulin, HTN. Pt found to have NSTEMI. Pt was brought into cath lab for planned cardiac on 2/10 and experienced a PEA cardiac arrest. CPR initiated. Pt intubated on 2/10.  2/13 s/p cardiac cath  Pt self extubated this AM; was on BiPAP; now on HFNC. Enteral nutrition support discontinued via OGT. Pt remains NPO at this time.  Prior to intubation pt was on a CHO Modified diet. Swallow evaluation (BSE, MBSS) pending. Labs & medications reviewed.  CBG's [id number].  Diet Order:   Diet Order            Diet NPO time specified  Diet effective now             EDUCATION NEEDS:    Not appropriate for education at this time  Skin:  Skin Assessment: Reviewed RN Assessment  Last BM:  2/9    Intake/Output Summary (Last 24 hours) at 08/11/2018 1257 Last data filed at 08/11/2018 1200 Gross per 24 hour  Intake 805.19 ml  Output 4460 ml  Net -3654.81 ml   Height:   Ht Readings from Last 1 Encounters:  08/06/18 5\' 2"  (1.575 m)   Weight:   Wt Readings from Last 1 Encounters:  08/11/18 64.6 kg   Ideal Body Weight:  50 kg  BMI:  Body mass index is 26.05 kg/m.  Estimated Nutritional Needs:   Kcal:  1800-2000  Protein:  90-105 gm  Fluid:  per MD  Arthur Holms, RD, LDN Pager #: [phone] After-Hours Pager #: [phone]

## 2018-08-11 NOTE — Progress Notes (Signed)
Attempted AM lab draw via swan, no blood drawing back. Hold on cooximetry at this time. Phlebotomy tech called to obtain AM labs.

## 2018-08-11 NOTE — Progress Notes (Signed)
PCCM INTERVAL PROGRESS NOTE  Called to bedside to assess patient after self-extubation.  She is being treated for acute heart failure and cardiogenic shock.   Pulmonary edema on chest x ray and she has been in the process of diuresis.   Once extubated patient desaturated to the mid 80s, and quickly started on NRB and subsequently BiPAP. Sats improved to the mid 90s and patient states she is breathing comfortably.    Plan: Continue to monitor on BiPAP  Georgann Housekeeper, AGACNP-BC [place] Pager [phone] or [phone]  08/11/2018 3:39 AM

## 2018-08-11 NOTE — Progress Notes (Signed)
Fio2 increased from 60% to 80% per ABG results.

## 2018-08-11 NOTE — Progress Notes (Signed)
RT called to the pt's room after self-extubation. Pt placed on NRB and Sp02 increased to the mid 90's.  Pt placed on Bipap per MD orders on documented settings.  To draw follow up ABG after 30 minutes of being on Bipap.

## 2018-08-12 ENCOUNTER — Inpatient Hospital Stay: Payer: Self-pay

## 2018-08-12 DIAGNOSIS — I5021 Acute systolic (congestive) heart failure: Secondary | ICD-10-CM

## 2018-08-12 LAB — CBC WITH DIFFERENTIAL/PLATELET
Abs Immature Granulocytes: 0.05 10*3/uL (ref 0.00–0.07)
Basophils Absolute: 0 10*3/uL (ref 0.0–0.1)
Basophils Relative: 0 %
EOS ABS: 0 10*3/uL (ref 0.0–0.5)
Eosinophils Relative: 0 %
HCT: 35.9 % — ABNORMAL LOW (ref 36.0–46.0)
Hemoglobin: 11.6 g/dL — ABNORMAL LOW (ref 12.0–15.0)
IMMATURE GRANULOCYTES: 1 %
Lymphocytes Relative: 10 %
Lymphs Abs: 1 10*3/uL (ref 0.7–4.0)
MCH: 28 pg (ref 26.0–34.0)
MCHC: 32.3 g/dL (ref 30.0–36.0)
MCV: 86.5 fL (ref 80.0–100.0)
Monocytes Absolute: 1.3 10*3/uL — ABNORMAL HIGH (ref 0.1–1.0)
Monocytes Relative: 12 %
NEUTROS PCT: 77 %
Neutro Abs: 7.8 10*3/uL — ABNORMAL HIGH (ref 1.7–7.7)
Platelets: 310 10*3/uL (ref 150–400)
RBC: 4.15 MIL/uL (ref 3.87–5.11)
RDW: 13.8 % (ref 11.5–15.5)
WBC: 10.1 10*3/uL (ref 4.0–10.5)
nRBC: 0.2 % (ref 0.0–0.2)

## 2018-08-12 LAB — MAGNESIUM: Magnesium: 1.7 mg/dL (ref 1.7–2.4)

## 2018-08-12 LAB — GLUCOSE, CAPILLARY
Glucose-Capillary: 110 mg/dL — ABNORMAL HIGH (ref 70–99)
Glucose-Capillary: 111 mg/dL — ABNORMAL HIGH (ref 70–99)
Glucose-Capillary: 156 mg/dL — ABNORMAL HIGH (ref 70–99)
Glucose-Capillary: 167 mg/dL — ABNORMAL HIGH (ref 70–99)
Glucose-Capillary: 178 mg/dL — ABNORMAL HIGH (ref 70–99)
Glucose-Capillary: 201 mg/dL — ABNORMAL HIGH (ref 70–99)

## 2018-08-12 LAB — BASIC METABOLIC PANEL
Anion gap: 22 — ABNORMAL HIGH (ref 5–15)
BUN: 28 mg/dL — ABNORMAL HIGH (ref 8–23)
CO2: 30 mmol/L (ref 22–32)
Calcium: 9.4 mg/dL (ref 8.9–10.3)
Chloride: 86 mmol/L — ABNORMAL LOW (ref 98–111)
Creatinine, Ser: 1.78 mg/dL — ABNORMAL HIGH (ref 0.44–1.00)
GFR calc Af Amer: 33 mL/min — ABNORMAL LOW (ref >60)
GFR calc non Af Amer: 29 mL/min — ABNORMAL LOW (ref >60)
Glucose, Bld: 148 mg/dL — ABNORMAL HIGH (ref 70–99)
Potassium: 3.7 mmol/L (ref 3.5–5.1)
Sodium: 138 mmol/L (ref 135–145)

## 2018-08-12 LAB — CBC
HCT: 34.6 % — ABNORMAL LOW (ref 36.0–46.0)
Hemoglobin: 11.4 g/dL — ABNORMAL LOW (ref 12.0–15.0)
MCH: 28.6 pg (ref 26.0–34.0)
MCHC: 32.9 g/dL (ref 30.0–36.0)
MCV: 86.7 fL (ref 80.0–100.0)
PLATELETS: 311 10*3/uL (ref 150–400)
RBC: 3.99 MIL/uL (ref 3.87–5.11)
RDW: 13.8 % (ref 11.5–15.5)
WBC: 11.3 10*3/uL — ABNORMAL HIGH (ref 4.0–10.5)
nRBC: 0 % (ref 0.0–0.2)

## 2018-08-12 LAB — COOXEMETRY PANEL
CARBOXYHEMOGLOBIN: 1.1 % (ref 0.5–1.5)
Methemoglobin: 1.5 % (ref 0.0–1.5)
O2 Saturation: 76.8 %
Total hemoglobin: 12 g/dL (ref 12.0–16.0)

## 2018-08-12 LAB — PHOSPHORUS: Phosphorus: 6 mg/dL — ABNORMAL HIGH (ref 2.5–4.6)

## 2018-08-12 MED ORDER — CHLORHEXIDINE GLUCONATE CLOTH 2 % EX PADS
6.0000 | MEDICATED_PAD | Freq: Every day | CUTANEOUS | Status: DC
  Administered 2018-08-13 – 2018-08-17 (×5): 6 via TOPICAL

## 2018-08-12 MED ORDER — POTASSIUM CHLORIDE 10 MEQ/100ML IV SOLN
10.0000 meq | INTRAVENOUS | Status: AC
  Administered 2018-08-12 (×4): 10 meq via INTRAVENOUS
  Filled 2018-08-12 (×4): qty 100

## 2018-08-12 MED ORDER — TICAGRELOR 90 MG PO TABS
90.0000 mg | ORAL_TABLET | Freq: Two times a day (BID) | ORAL | Status: DC
  Administered 2018-08-12 – 2018-08-17 (×10): 90 mg via ORAL
  Filled 2018-08-12 (×10): qty 1

## 2018-08-12 MED ORDER — ADULT MULTIVITAMIN W/MINERALS CH
1.0000 | ORAL_TABLET | Freq: Every day | ORAL | Status: DC
  Administered 2018-08-12 – 2018-08-17 (×6): 1 via ORAL
  Filled 2018-08-12 (×6): qty 1

## 2018-08-12 MED ORDER — SODIUM CHLORIDE 0.9% FLUSH
10.0000 mL | Freq: Two times a day (BID) | INTRAVENOUS | Status: DC
  Administered 2018-08-12 – 2018-08-13 (×2): 10 mL
  Administered 2018-08-13: 20 mL
  Administered 2018-08-15 – 2018-08-17 (×5): 10 mL

## 2018-08-12 MED ORDER — ORAL CARE MOUTH RINSE
15.0000 mL | Freq: Two times a day (BID) | OROMUCOSAL | Status: DC
  Administered 2018-08-12 – 2018-08-17 (×10): 15 mL via OROMUCOSAL

## 2018-08-12 MED ORDER — SODIUM CHLORIDE 0.9% FLUSH: 10.0000 mL | INTRAVENOUS | Status: DC | PRN

## 2018-08-12 MED ORDER — TICAGRELOR 90 MG PO TABS
180.0000 mg | ORAL_TABLET | Freq: Once | ORAL | Status: AC
  Administered 2018-08-12: 180 mg via ORAL
  Filled 2018-08-12: qty 2

## 2018-08-12 NOTE — Progress Notes (Signed)
NAME:  [name], MRN:  [id number], DOB:  14-Nov-1950, LOS: 60 ADMISSION DATE:  08/06/2018,  CONSULTATION DATE: 08/07/2018 REFERRING MD: Cardiology, CHIEF COMPLAINT: Respiratory failure status post PEA cardiac arrest  Brief History   68 year old female with coronary artery disease status post PEA arrest on 08/07/2018  History of present illness   68 year old female with history of coronary disease, lifelong smoker, diabetes mellitus who was admitted 08/06/2018 with a non-ST elevation MI.  He was initially mated to the [facility].  She was scheduled for left heart catheterization on 08/07/2018.  While being placed on table she had a PEA arrest that lasted for 4 minutes of CPR and 1 round of epinephrine.  He had return of spontaneous circulation was awake alert but looked in acute discomfort.  She was not mentating well was not moving oxygen.  She had a profound acidosis and was transferred to the heart unit where she was urgently intubated and placed on full mechanical ventilatory support.  Heart catheterization has been postponed at this time.  Her CBG was noted to be greater than 200 in the Cath Lab.  Pulmonary critical care is asked to assume her care at this time.  Past Medical History  Coronary artery disease Hypertension Diabetes Tobacco abuse  Significant Hospital Events   08/07/2018 PEA arrest CPR for 4 minutes 1 round of epi with return of spontaneous circulation 2/13> cardiac cath, LAD stent  2.14.2020: Self-extubated. Placed on bipap without failure  Consults:  08/06/2008 cardiology 08/07/2008 pulmonary critical care  Procedures:  08/07/2018 intubation>> 2/13> cardiac cath  Significant Diagnostic Tests:   CXR 2/12> interval increase in bilateral opacities, R>L  CXR 2/13> bilateral infiltrates, slightly improved from prior however still persistent pulm edema  CXR 2/14: Improving but still mixed alveolar -interstitial pattern  Micro Data:  2/9 BCx NGTD 2/10 resp cx: no  organisms seen  2/10- RVP: neg 2/10 MRSA: neg   Antimicrobials:  None  Interim history/subjective:   Self extubated overnight and now is on BiPAP  Objective   Blood pressure 120/79, pulse (!) 110, temperature 97.8 F (36.6 C), temperature source Oral, resp. rate 20, height 5\' 2"  (1.575 m), weight 61.9 kg, SpO2 93 %. PAP: (30-56)/(9-26) 46/13 CO:  [4.8 L/min] 4.8 L/min CI:  [2.8 L/min/m2] 2.8 L/min/m2  Vent Mode: CPAP;PCV FiO2 (%):  [40 %-50 %] 40 % Set Rate:  [12 bmp] 12 bmp PEEP:  [5 cmH20] 5 cmH20 Pressure Support:  [5 cmH20] 5 cmH20   Intake/Output Summary (Last 24 hours) at 08/12/2018 2108 Last data filed at 08/12/2018 1800 Gross per 24 hour  Intake 610.63 ml  Output 1110 ml  Net -499.37 ml   Filed Weights   08/11/18 0500 08/11/18 0739 08/12/18 0500  Weight: 65.9 kg 64.6 kg 61.9 kg   Examination: General: Older than stated age adult female HENT: [place]/AT, PERRL, EOM-I and MMM Lungs: Bibasilar crackles noted Cardiovascular: RRR, Nl S1/S2 and -M/R/G Abdomen: Soft, NT, ND and +BS  Extremities: Symmetrical bulk and tone, no pedal edema  Neuro: Alert, slow to respond but following command Skin: clean, dry, intact. Cool toes.   Resolved Hospital Problem list    Cardiogenic shock Assessment & Plan:   Hypoxic respiratory failure; requiring mechanical ventilation -Suspect Aspiration Pulmonary edema on CXR improving. May have mixed interstitial pattern as well-will consider CT of chest but will lay flat to determine safety/suitability  Lasix stopped BiPap at night  Acute renal failure:  Suspect related to diuresis Hold for now Follow CXR  S/p PEA arrest  -Post 6 minutes PEA arrest requiring 4 minutes of CPR and 1 amp of epinephrine on 08/07/2018 while inCath Lab and cardiac cath was not completed at that time -resultant cardiogenic shock  Her coronary disease with recent chest pain and admission an elevated troponin 6.7 Hypotension post intubation has  resolved Has history of hypertension  P - Milrinone now off -consider removing PA catheter  Pulmonary Edema P - Lasix discontinued - Strict I/O  - Follow BMP, lytes   Electrolyte abnormality -Hypomagnesemia -Hypophosphatemia -at risk hypokalemia given diuresis  P - Monitor BMP. Mag phos - Replete PRN   Diabetes Mellitus - Sliding scale insulin  Malnutrition, at risk - NPO - Repeat swallow evaluation improved. Started diet  Best practice:  Diet:Passed eval-on diet Pain/Anxiety/Delirium protocol (if indicated): VAP protocol (if indicated): N/A DVT prophylaxis: Heparin gtt  GI prophylaxis: Stopped Glucose control: Sliding scale insulin protocol Mobility: Start PT Code Status: Full Family Communication: Discussed with family and patient at bedside Disposition: Continue ICU level of care   Labs   CBC: Recent Labs  Lab 08/06/18 0628  08/08/18 0457 08/09/18 0547  08/10/18 0340  08/10/18 0858 08/10/18 0911 08/11/18 0429 08/11/18 0600 08/12/18 0338  WBC 7.6   < > 14.1* 11.7*  --  13.3*  --   --   --   --  14.6* 11.3*  NEUTROABS 4.8  --  10.8*  --   --   --   --   --   --   --   --   --   HGB 15.5*   < > 13.2 12.6   < > 13.2   < > 13.6 11.9* 11.9* 14.0 11.4*  HCT 46.0   < > 39.6 38.5   < > 41.3   < > 40.0 35.0* 35.0* 42.2 34.6*  MCV 85.8   < > 85.3 87.1  --  86.9  --   --   --   --  85.6 86.7  PLT 325   < > 329 321  --  327  --   --   --   --  319 311   < > = values in this interval not displayed.    Basic Metabolic Panel: Recent Labs  Lab 08/09/18 0547 08/09/18 1900  08/10/18 0340  08/10/18 0858 08/10/18 0911 08/10/18 1902 08/11/18 0429 08/11/18 0600 08/12/18 0338  NA 135 137   < > 137   < > 113* 134*  --  132* 136 138  K 3.8 3.3*   < > 4.3   < > 3.4* 4.0  --  3.5 4.0 3.7  CL 100 100  --  97*  --   --   --   --   --  93* 86*  CO2 27 24  --  25  --   --   --   --   --  28 30  GLUCOSE 152* 163*  --  153*  --   --   --   --   --  234* 148*  BUN 25*  28*  --  28*  --   --   --   --   --  30* 28*  CREATININE 1.31* 1.35*  --  1.32*  --   --   --   --   --  1.43* 1.78*  CALCIUM 8.1* 8.5*  --  8.7*  --   --   --   --   --  9.3 9.4  MG 1.4* 1.6*  --  1.7  --   --   --  2.1  --  1.9 1.7  PHOS 1.8* 2.8  --  3.2  --   --   --  2.5  --  3.3 6.0*   < > = values in this interval not displayed.   GFR: Estimated Creatinine Clearance: 25.8 mL/min (A) (by C-G formula based on SCr of 1.78 mg/dL (H)). Recent Labs  Lab 08/07/18 0018 08/07/18 0239  08/09/18 0547 08/10/18 0340 08/10/18 1332 08/10/18 2045 08/11/18 0600 08/12/18 0338  WBC  --  13.1*   < > 11.7* 13.3*  --   --  14.6* 11.3*  LATICACIDVEN 4.3* 5.7*  --   --   --  2.2* 1.7  --   --    < > = values in this interval not displayed.    Liver Function Tests: Recent Labs  Lab 08/06/18 0623 08/07/18 0239  AST 22 24  ALT 12 15  ALKPHOS 83 30*  BILITOT 1.4* 0.8  PROT 7.0 7.5  ALBUMIN 2.5* 2.6*   Recent Labs  Lab 08/06/18 0628  LIPASE 19   No results for input(s): AMMONIA in the last 168 hours.  ABG    Component Value Date/Time   PHART 7.521 (H) 08/11/2018 0429   PCO2ART 39.2 08/11/2018 0429   PO2ART 57.0 (L) 08/11/2018 0429   HCO3 32.1 (H) 08/11/2018 0429   TCO2 33 (H) 08/11/2018 0429   ACIDBASEDEF 8.0 (H) 08/10/2018 0858   O2SAT 76.8 08/12/2018 0356     Coagulation Profile: No results for input(s): INR, PROTIME in the last 168 hours.  Cardiac Enzymes: Recent Labs  Lab 08/06/18 0628 08/06/18 1148 08/06/18 1321  TROPONINI 7.53* 6.55* 6.29*    HbA1C: Hgb A1c MFr Bld  Date/Time Value Ref Range Status  08/06/2018 02:00 PM 10.2 (H) 4.8 - 5.6 % Final    Comment:    (NOTE) Pre diabetes:          5.7%-6.4% Diabetes:              >6.4% Glycemic control for   <7.0% adults with diabetes    CBG: Recent Labs  Lab 08/12/18 0409 08/12/18 0802 08/12/18 1139 08/12/18 1636 08/12/18 1950  GLUCAP 156* 167* 178* 201* 111*   The patient is critically ill but  improving with  organ systems failure and requires high complexity decision making for assessment and support, frequent evaluation and titration of therapies, application of advanced monitoring technologies and extensive interpretation of multiple databases.   Critical Care Time devoted to patient care services described in this note is  30  Minutes. This time reflects time of care of this signee Dr. Irene Pap.   Marland Kitchen

## 2018-08-12 NOTE — Progress Notes (Signed)
eLink Physician-Brief Progress Note Patient Name: Rhilyn Battle DOB: 12-Sep-1950 MRN: [id number]   Date of Service  08/12/2018  HPI/Events of Note  Hypokalemia  eICU Interventions  Potassium replaced     Intervention Category Intermediate Interventions: Electrolyte abnormality - evaluation and management  DETERDING,ELIZABETH 08/12/2018, 5:18 AM

## 2018-08-12 NOTE — Progress Notes (Signed)
  Speech Language Pathology Treatment: Dysphagia  Patient Details Name: [name] MRN: [id number] DOB: 10-05-1950 Today's Date: 08/12/2018 Time: 1000-1019 SLP Time Calculation (min) (ACUTE ONLY): 19 min  Assessment / Plan / Recommendation Clinical Impression  Per RN report pt has improved overall from 2/14.  Although, voice is still weak, pt is able to phonate and demonstrated productive cough.  Orders for repeat swallow evaluation received. Today, pt exhibited immediate wet cough with thin liquid and nectar thick liquid by straw.  With nectar thick liquid by cup there were no clinical s/s of aspiration.  Pt tolerated regular solids and puree with no clinical s/s of aspiration, and exhibited good oral clearance of solids despite edentulism.  Pt typically wears dentures with PO intake, but they are not present.  Pt/family believe another daughter should be able to bring them.  Pt would prefer regular texture diet, choosing softer foods as needed.  Pt would benefit from instrumental study; while FEES would be preferred to visualize vocal fold function post extubation, MBSS may be available over weekend depending on radiology availability.   Recommend regular texture diet with nectar thick liquid by cup.    HPI HPI: Pt is a 68 yo female admitted 2/9 with NSTEMI. PEA arrest on 2/10. Pt was intubated on 2/10, self-extubated 2/14. Cardiac cath 2/13. PMH: CAD, HTN, DM, tobacco abuse.  CXR 2/14 concerning for bibasilar atelectasis or infiltrates.      SLP Plan  New goals to be determined pending instrumental study       Recommendations  Diet recommendations: Regular;Nectar-thick liquid Liquids provided via: Cup;No straw Medication Administration: Whole meds with puree Supervision: Patient able to self feed Compensations: Slow rate;Small sips/bites Postural Changes and/or Swallow Maneuvers: Seated upright 90 degrees                Oral Care Recommendations: Oral care BID SLP Visit  Diagnosis: Dysphagia, oropharyngeal phase (R13.12) Plan: New goals to be determined pending instrumental study       [place], [place], [place] Office: [phone]; Pager [phone] 08/12/2018, 10:30 AM

## 2018-08-12 NOTE — Progress Notes (Signed)
Peripherally Inserted Central Catheter/Midline Placement  The IV Nurse has discussed with the patient and/or persons authorized to consent for the patient, the purpose of this procedure and the potential benefits and risks involved with this procedure.  The benefits include less needle sticks, lab draws from the catheter, and the patient may be discharged home with the catheter. Risks include, but not limited to, infection, bleeding, blood clot (thrombus formation), and puncture of an artery; nerve damage and irregular heartbeat and possibility to perform a PICC exchange if needed/ordered by physician.  Alternatives to this procedure were also discussed.  Bard Power PICC patient education guide, fact sheet on infection prevention and patient information card has been provided to patient /or left at bedside.    PICC/Midline Placement Documentation  PICC Single Lumen 08/12/18 PICC Right Brachial 35 cm 0 cm (Active)  Indication for Insertion or Continuance of Line Prolonged intravenous therapies 08/12/2018  4:00 PM  Exposed Catheter (cm) 0 cm 08/12/2018  4:00 PM  Site Assessment Clean;Dry;Intact 08/12/2018  4:00 PM  Line Status Flushed;Blood return noted;Saline locked 08/12/2018  4:00 PM  Dressing Type Transparent 08/12/2018  4:00 PM  Dressing Status Clean;Dry;Intact;Antimicrobial disc in place 08/12/2018  4:00 PM  Line Care Connections checked and tightened 08/12/2018  4:00 PM  Dressing Intervention New dressing 08/12/2018  4:00 PM  Dressing Change Due 08/19/18 08/12/2018  4:00 PM       Lorenza Cambridge 08/12/2018, 4:27 PM

## 2018-08-12 NOTE — Progress Notes (Signed)
Pt placed back on Bipap due to increased WOB following a bath.  RN at bedside.

## 2018-08-12 NOTE — Progress Notes (Signed)
Patient ID: [name], female   DOB: Nov 07, 1950, 68 y.o.   MRN: [id number]     Advanced Heart Failure Rounding Note  PCP-Cardiologist: No primary care provider on file.   Subjective:    2/9 Admitted 2/9 with [place] and respiratory distress 2/10 PEA arrest 2/13 Underwent PCI of LAD 2/13 self-extubated  Milrinone decreased to 0.125 yesterday, co-ox 77% today. Now on 5L [place].  Failed swallow study so remains on tirofiban.  Creatinine elevated to 1.78.   Denies chest pain.   [place] #s CVP 4 41/12 CI 2.8   RHC 08/10/18  LAD 80%---> DES LAD    RA 10  PA 60/28 (43) PCWP 29  CI 1.8   Echo 08/06/2018 - EF 25% , RV normal mild TR, and severe HK.   Objective:   Weight Range: 61.9 kg Body mass index is 24.96 kg/m.   Vital Signs:   Temp:  [96.6 F (35.9 C)-97.9 F (36.6 C)] 97.3 F (36.3 C) (02/15 0752) Pulse Rate:  [71-125] 111 (02/15 0752) Resp:  [16-36] 22 (02/15 0752) BP: (98-125)/(49-76) 109/53 (02/15 0752) SpO2:  [89 %-100 %] 98 % (02/15 0752) FiO2 (%):  [40 %-60 %] 40 % (02/15 0326) Weight:  [61.9 kg] 61.9 kg (02/15 0500) Last BM Date: 08/06/18  Weight change: Filed Weights   08/11/18 0500 08/11/18 0739 08/12/18 0500  Weight: 65.9 kg 64.6 kg 61.9 kg    Intake/Output:   Intake/Output Summary (Last 24 hours) at 08/12/2018 0905 Last data filed at 08/12/2018 0600 Gross per 24 hour  Intake 111.23 ml  Output 2210 ml  Net -2098.77 ml      Physical Exam    CVP 4 personally checked  General: NAD Neck: No JVD, no thyromegaly or thyroid nodule.  Lungs: Distant BS CV: Nondisplaced PMI.  Heart regular S1/S2, no S3/S4, no murmur.  No peripheral edema.   Abdomen: Soft, nontender, no hepatosplenomegaly, no distention.  Skin: Intact without lesions or rashes.  Neurologic: Alert and oriented x 3.  Psych: Normal affect. Extremities: No clubbing or cyanosis.  HEENT: Normal.    Telemetry   NSR 100s, personally reviewed.     EKG    N/a   Labs    CBC Recent Labs     08/11/18 0600 08/12/18 0338  WBC 14.6* 11.3*  HGB 14.0 11.4*  HCT 42.2 34.6*  MCV 85.6 86.7  PLT 319 938   Basic Metabolic Panel Recent Labs    08/11/18 0600 08/12/18 0338  NA 136 138  K 4.0 3.7  CL 93* 86*  CO2 28 30  GLUCOSE 234* 148*  BUN 30* 28*  CREATININE 1.43* 1.78*  CALCIUM 9.3 9.4  MG 1.9 1.7  PHOS 3.3 6.0*   Liver Function Tests No results for input(s): AST, ALT, ALKPHOS, BILITOT, PROT, ALBUMIN in the last 72 hours. No results for input(s): LIPASE, AMYLASE in the last 72 hours. Cardiac Enzymes No results for input(s): CKTOTAL, CKMB, CKMBINDEX, TROPONINI in the last 72 hours.  BNP: BNP (last 3 results) Recent Labs    08/06/18 0628  BNP 1,535.2*    ProBNP (last 3 results) No results for input(s): PROBNP in the last 8760 hours.   D-Dimer No results for input(s): DDIMER in the last 72 hours. Hemoglobin A1C No results for input(s): HGBA1C in the last 72 hours. Fasting Lipid Panel No results for input(s): CHOL, HDL, LDLCALC, TRIG, CHOLHDL, LDLDIRECT in the last 72 hours. Thyroid Function Tests No results for input(s): TSH, T4TOTAL, T3FREE, THYROIDAB in the  last 72 hours.  Invalid input(s): FREET3  Other results:   Imaging    [place] Ekg Site Rite  Result Date: 08/12/2018 If Site Rite image not attached, placement could not be confirmed due to current cardiac rhythm.    Medications:     Scheduled Medications: . aspirin  81 mg Per Tube Daily  . atorvastatin  40 mg Per Tube q1800  . chlorhexidine gluconate (MEDLINE KIT)  15 mL Mouth Rinse BID  . Chlorhexidine Gluconate Cloth  6 each Topical Daily  . digoxin  0.125 mg Oral Daily  . fluconazole  200 mg Per Tube Daily  . heparin injection (subcutaneous)  5,000 Units Subcutaneous Q8H  . insulin aspart  0-20 Units Subcutaneous Q4H  . losartan  12.5 mg Oral QHS  . multivitamin  15 mL Oral Daily  . pantoprazole (PROTONIX) IV  40 mg Intravenous Q24H  . senna  2 tablet Per Tube Daily  . sodium  chloride flush  10-40 mL Intracatheter Q12H  . sodium chloride flush  10-40 mL Intracatheter Q12H  . spironolactone  25 mg Oral Daily    Infusions: . potassium chloride 10 mEq (08/12/18 0834)  . tirofiban 0.075 mcg/kg/min (08/12/18 0600)    PRN Medications: acetaminophen **OR** acetaminophen, albuterol, ondansetron **OR** ondansetron (ZOFRAN) IV, sodium chloride flush, sodium chloride flush, sodium chloride flush    Patient Profile   [name] is a 68 year old with a history of DMII, HTN, and smoker admitted with increased shortness of breath and chest pain. Troponin 7.5>6.5>6.3 and BNP 1535. CXR was concerning for pulmonary edema so IV lasix was given. Taken to the cath lab on 2/10 but this was aborted due to PEA arrest. Assessment/Plan   1. Acute Hypoxic Respiratory Failure--> PEA arrest  - Intubated on 2/10 with CCM managing. Self extubated overnight. Now on 5L [place].    - CXR with diffuse bilateral infiltrates and still with significant oxygen requirement but CVP low.  ? Component of ARDS or aspiration with PEA arrest (and baseline COPD).  No fever and WBCs not significantly elevated. CCM following  2. PEA arrest on 2/10  - due to hypoxia in setting of severe COPD and CHF  3. Acute systolic HR -> Cardiogenic Shock in the setting of NSTEMI/PEA arrest  - Suspect mixed ICM. NICM. S/p PCI  80% LAD on 12/15. ECHO 2/9 EF 20-25%  - Initial hemodynamics c/w shock. Now improving with milrinone down to 0.125. Co-ox 77%, CI 2.8. Stop milrinone today.  - Continue current spironolactone and losartan. - CVP 4 with creatinine up to 1.78.  Stop Lasix.  Can pull swan today.   4. Pulmonary Hypertension  - Mostly pulmonary venous HTN.  PVR 4.7 No formal diagnosis of COPD but has long history of smoking.  - PA pressure better on milrinone.    4. NSTEMI  - peak troponin 7.5 - 08/10/18 LHC with DES to 80% mLAD - She has not passed her swallow study so still getting IV tirofiban.  If passes  today, transition to ASA/ticagrelor.   5. DMII -On SS per primary  - Hgb A 1 C 10.2    CRITICAL CARE Performed by: Loralie Champagne  Total critical care time: 35 minutes  Critical care time was exclusive of separately billable procedures and treating other patients.  Critical care was necessary to treat or prevent imminent or life-threatening deterioration.  Critical care was time spent personally by me (independent of midlevel providers or residents) on the following activities: development of  treatment plan with patient and/or surrogate as well as nursing, discussions with consultants, evaluation of patient's response to treatment, examination of patient, obtaining history from patient or surrogate, ordering and performing treatments and interventions, ordering and review of laboratory studies, ordering and review of radiographic studies, pulse oximetry and re-evaluation of patient's condition.   Length of Stay: 6  Loralie Champagne, MD  9:05 AM Advanced Heart Failure Team Pager [phone] (M-F; 7a - 4p)  Please contact [facility] for night-coverage after hours (4p -7a ) and weekends on [email or web address]

## 2018-08-12 NOTE — Progress Notes (Signed)
Nutrition Brief Note  Cortrak order had been placed yesterday.   Today, SLP reevaluated pt and recommended diet advancement. Spoke with RN and confirmed no further indication for cortrak. Will d/c order  Burtis Junes RD, LDN, CNSC Clinical Nutrition Available Tues-Sat via Pager: [phone] 08/12/2018 11:12 AM

## 2018-08-12 NOTE — Progress Notes (Signed)
ANTICOAGULATION CONSULT NOTE - Follow Up Consult  Pharmacy Consult for aggrastat Indication: NSTEMI  No Known Allergies  Patient Measurements: Height: 5\' 2"  (157.5 cm) Weight: 136 lb 7.4 oz (61.9 kg) IBW/kg (Calculated) : 50.1 Heparin Dosing Weight: 62.8 kg  Vital Signs: Temp: 97 F (36.1 C) (02/15 1000) Temp Source: Core (02/15 0300) BP: 113/89 (02/15 1000) Pulse Rate: 98 (02/15 1000)  Labs: Recent Labs    08/10/18 0340  08/11/18 0429 08/11/18 0600 08/12/18 0338  HGB 13.2   < > 11.9* 14.0 11.4*  HCT 41.3   < > 35.0* 42.2 34.6*  PLT 327  --   --  319 311  HEPARINUNFRC 0.38  --   --   --   --   CREATININE 1.32*  --   --  1.43* 1.78*   < > = values in this interval not displayed.    Estimated Creatinine Clearance: 25.8 mL/min (A) (by C-G formula based on SCr of 1.78 mg/dL (H)).   Medications:  Infusions:  . tirofiban 0.075 mcg/kg/min (08/12/18 0900)    Assessment: 68 yo female on IV Heparin s/p PEA arrest, NSTEMI. Pharmacy dosing aggrastat.   Patient passed swallow exam this am, Rn attempting meds in applesauce, will order brilinta load and stop aggrastat this am if able to take.    Goal of Therapy:  Monitor platelets by anticoagulation protocol: Yes   Plan:  Continue aggrastat until ticagrelor is taken Aspirin continued Discussed plan and orders with Rn  Erin Hearing PharmD., BCPS Clinical Pharmacist 08/12/2018 11:14 AM

## 2018-08-12 NOTE — Progress Notes (Signed)
Pt taken off Bipap due to secretions when coughing.  Pt placed on [place] HFNC 5 L, tolerating well at this time. RN notified.

## 2018-08-13 ENCOUNTER — Inpatient Hospital Stay (HOSPITAL_COMMUNITY): Payer: Medicare HMO

## 2018-08-13 DIAGNOSIS — I5043 Acute on chronic combined systolic (congestive) and diastolic (congestive) heart failure: Secondary | ICD-10-CM

## 2018-08-13 LAB — BASIC METABOLIC PANEL
Anion gap: 18 — ABNORMAL HIGH (ref 5–15)
Anion gap: 16 — ABNORMAL HIGH (ref 5–15)
BUN: 27 mg/dL — ABNORMAL HIGH (ref 8–23)
BUN: 25 mg/dL — ABNORMAL HIGH (ref 8–23)
CO2: 29 mmol/L (ref 22–32)
CO2: 33 mmol/L — ABNORMAL HIGH (ref 22–32)
Calcium: 9.3 mg/dL (ref 8.9–10.3)
Calcium: 9.4 mg/dL (ref 8.9–10.3)
Chloride: 90 mmol/L — ABNORMAL LOW (ref 98–111)
Chloride: 89 mmol/L — ABNORMAL LOW (ref 98–111)
Creatinine, Ser: 1.79 mg/dL — ABNORMAL HIGH (ref 0.44–1.00)
Creatinine, Ser: 1.55 mg/dL — ABNORMAL HIGH (ref 0.44–1.00)
GFR calc Af Amer: 39 mL/min — ABNORMAL LOW (ref >60)
GFR calc non Af Amer: 28 mL/min — ABNORMAL LOW (ref >60)
GFR, EST AFRICAN AMERICAN: 33 mL/min — AB (ref >60)
GFR, EST NON AFRICAN AMERICAN: 34 mL/min — AB (ref >60)
GLUCOSE: 99 mg/dL (ref 70–99)
Glucose, Bld: 167 mg/dL — ABNORMAL HIGH (ref 70–99)
Potassium: 3.2 mmol/L — ABNORMAL LOW (ref 3.5–5.1)
Potassium: 3.2 mmol/L — ABNORMAL LOW (ref 3.5–5.1)
Sodium: 137 mmol/L (ref 135–145)
Sodium: 138 mmol/L (ref 135–145)

## 2018-08-13 LAB — GLUCOSE, CAPILLARY
GLUCOSE-CAPILLARY: 163 mg/dL — AB (ref 70–99)
Glucose-Capillary: 111 mg/dL — ABNORMAL HIGH (ref 70–99)
Glucose-Capillary: 117 mg/dL — ABNORMAL HIGH (ref 70–99)
Glucose-Capillary: 160 mg/dL — ABNORMAL HIGH (ref 70–99)
Glucose-Capillary: 166 mg/dL — ABNORMAL HIGH (ref 70–99)
Glucose-Capillary: 174 mg/dL — ABNORMAL HIGH (ref 70–99)

## 2018-08-13 LAB — CBC WITH DIFFERENTIAL/PLATELET
ABS IMMATURE GRANULOCYTES: 0.05 10*3/uL (ref 0.00–0.07)
Basophils Absolute: 0 10*3/uL (ref 0.0–0.1)
Basophils Relative: 0 %
Eosinophils Absolute: 0.1 10*3/uL (ref 0.0–0.5)
Eosinophils Relative: 1 %
HCT: 36.1 % (ref 36.0–46.0)
Hemoglobin: 11.7 g/dL — ABNORMAL LOW (ref 12.0–15.0)
Immature Granulocytes: 1 %
Lymphocytes Relative: 15 %
Lymphs Abs: 1.7 10*3/uL (ref 0.7–4.0)
MCH: 27.9 pg (ref 26.0–34.0)
MCHC: 32.4 g/dL (ref 30.0–36.0)
MCV: 86.2 fL (ref 80.0–100.0)
Monocytes Absolute: 1.3 10*3/uL — ABNORMAL HIGH (ref 0.1–1.0)
Monocytes Relative: 12 %
Neutro Abs: 7.7 10*3/uL (ref 1.7–7.7)
Neutrophils Relative %: 71 %
Platelets: 349 10*3/uL (ref 150–400)
RBC: 4.19 MIL/uL (ref 3.87–5.11)
RDW: 13.8 % (ref 11.5–15.5)
WBC: 10.8 10*3/uL — AB (ref 4.0–10.5)
nRBC: 0 % (ref 0.0–0.2)

## 2018-08-13 LAB — COOXEMETRY PANEL
Carboxyhemoglobin: 1.4 % (ref 0.5–1.5)
Methemoglobin: 0.6 % (ref 0.0–1.5)
O2 SAT: 54.5 %
Total hemoglobin: 13.2 g/dL (ref 12.0–16.0)

## 2018-08-13 LAB — PHOSPHORUS: Phosphorus: 4.3 mg/dL (ref 2.5–4.6)

## 2018-08-13 LAB — MAGNESIUM: Magnesium: 1.9 mg/dL (ref 1.7–2.4)

## 2018-08-13 MED ORDER — POTASSIUM CHLORIDE CRYS ER 20 MEQ PO TBCR
40.0000 meq | EXTENDED_RELEASE_TABLET | Freq: Once | ORAL | Status: AC
  Administered 2018-08-13: 40 meq via ORAL
  Filled 2018-08-13: qty 2

## 2018-08-13 MED ORDER — TORSEMIDE 20 MG PO TABS: 20.0000 mg | ORAL_TABLET | Freq: Every day | ORAL | Status: DC

## 2018-08-13 MED ORDER — POTASSIUM CHLORIDE 10 MEQ/100ML IV SOLN
10.0000 meq | INTRAVENOUS | Status: AC
  Administered 2018-08-14 (×2): 10 meq via INTRAVENOUS
  Filled 2018-08-13 (×2): qty 100

## 2018-08-13 MED ORDER — RESOURCE THICKENUP CLEAR PO POWD
ORAL | Status: DC | PRN
  Filled 2018-08-13: qty 125

## 2018-08-13 MED ORDER — FUROSEMIDE 10 MG/ML IJ SOLN
40.0000 mg | Freq: Two times a day (BID) | INTRAMUSCULAR | Status: DC
  Administered 2018-08-13 (×2): 40 mg via INTRAVENOUS
  Filled 2018-08-13 (×2): qty 4

## 2018-08-13 MED ORDER — PANTOPRAZOLE SODIUM 40 MG PO TBEC
40.0000 mg | DELAYED_RELEASE_TABLET | Freq: Every day | ORAL | Status: DC
  Administered 2018-08-14 – 2018-08-17 (×4): 40 mg via ORAL
  Filled 2018-08-13 (×4): qty 1

## 2018-08-13 NOTE — Progress Notes (Signed)
NAME:  [name], MRN:  [id number], DOB:  1951/06/09, LOS: 7 ADMISSION DATE:  08/06/2018,  CONSULTATION DATE: 08/07/2018 REFERRING MD: Cardiology, CHIEF COMPLAINT: Respiratory failure status post PEA cardiac arrest  Brief History   68 year old female with coronary artery disease status post PEA arrest on 08/07/2018  History of present illness   68 year old female with history of coronary disease, lifelong smoker, diabetes mellitus who was admitted 08/06/2018 with a non-ST elevation MI.  He was initially mated to the [facility].  She was scheduled for left heart catheterization on 08/07/2018.  While being placed on table she had a PEA arrest that lasted for 4 minutes of CPR and 1 round of epinephrine.  He had return of spontaneous circulation was awake alert but looked in acute discomfort.  She was not mentating well was not moving oxygen.  She had a profound acidosis and was transferred to the heart unit where she was urgently intubated and placed on full mechanical ventilatory support.  Heart catheterization has been postponed at this time.  Her CBG was noted to be greater than 200 in the Cath Lab.  Pulmonary critical care is asked to assume her care at this time.  Past Medical History  Coronary artery disease Hypertension Diabetes Tobacco abuse  Significant Hospital Events   08/07/2018 PEA arrest CPR for 4 minutes 1 round of epi with return of spontaneous circulation 2/13> cardiac cath, LAD stent  2.14.2020: Self-extubated. Placed on bipap without failure  Consults:  08/06/2008 cardiology 08/07/2008 pulmonary critical care  Procedures:  08/07/2018 intubation>> 2/13> cardiac cath  Significant Diagnostic Tests:   CXR 2/12> interval increase in bilateral opacities, R>L  CXR 2/13> bilateral infiltrates, slightly improved from prior however still persistent pulm edema  CXR 2/14: Improving but still mixed alveolar -interstitial pattern 2.16.20: continues to show improvement  Micro Data:    2/9 BCx NGTD 2/10 resp cx: no organisms seen  2/10- RVP: neg 2/10 MRSA: neg   Antimicrobials:  None  Interim history/subjective:   Tolerated [place] O2. On bipap at night. Passed swallow evaluation PT eval done No complaints  Objective   Blood pressure 110/70, pulse 91, temperature (!) 97.5 F (36.4 C), temperature source Oral, resp. rate (!) 23, height 5\' 2"  (1.575 m), weight 60.1 kg, SpO2 93 %.    Vent Mode: BIPAP;PCV FiO2 (%):  [40 %] 40 % Set Rate:  [12 bmp] 12 bmp PEEP:  [5 cmH20] 5 cmH20   Intake/Output Summary (Last 24 hours) at 08/13/2018 1948 Last data filed at 08/13/2018 1600 Gross per 24 hour  Intake -  Output 401 ml  Net -401 ml   Filed Weights   08/11/18 0739 08/12/18 0500 08/13/18 0317  Weight: 64.6 kg 61.9 kg 60.1 kg   Examination: General: Non-toxic, ill, non-intubated, Awake and alert, NAD HENT: [place]/AT, [place], mucous membranes moist. IJ CVC intact, no hematoma. PA catheter now removed Lungs: Bibasilar dry rhonchi, improved aeration Cardiovascular: RRR, Nl S1/S2 , No murmur/gallop Abdomen: Soft, NT, ND and +BS  Extremities: Minimal edema Neuro: Alert, GCS 15 Skin: clean, dry, intact. No mottling  Resolved Hospital Problem list    Cardiogenic shock Assessment & Plan:   Hypoxic respiratory failure; requiring mechanical ventilation -Suspect Aspiration Pulmonary edema on CXR- improving on exam and xray May have mixed interstitial pattern as well-to consider CT of chest if no salient improvement  Lasix restarted Continue BiPap at night and PT  Acute renal failure:  Suspect related to diuresis Resumed diuresis by heart failure group Follow  CXR  S/p PEA arrest  -Post 6 minutes PEA arrest requiring 4 minutes of CPR and 1 amp of epinephrine on 08/07/2018 while inCath Lab and cardiac cath was not completed at that time -resultant cardiogenic shock  Her coronary disease with recent chest pain and admission an elevated troponin 6.7 Hypotension post  intubation has resolved Has history of hypertension   Pulmonary Edema P - Lasix restarted - Strict I/O  - Follow BMP, lytes   Electrolyte abnormality -Hypomagnesemia -Hypophosphatemia -at risk hypokalemia given diuresis  P - Monitor BMP. Mag phos - Replete PRN   Diabetes Mellitus - Sliding scale insulin  Malnutrition, at risk - Repeat swallow evaluation improved. Started diet-Dysphagia 3  Best practice:  Diet: Passed eval-dysphagia 3 Pain/Anxiety/Delirium protocol (if indicated):N/A VAP protocol (if indicated): N/A DVT prophylaxis: Heparin Prophylaxis GI prophylaxis: Stopped Glucose control: Sliding scale insulin protocol Mobility: Started PT Code Status: Full Family Communication: Discussed with family and patient at bedside Disposition: Continue ICU level of care-may be suitable for transition to step-down in 24 hours   Labs   CBC: Recent Labs  Lab 08/08/18 0457  08/10/18 0340  08/11/18 0429 08/11/18 0600 08/12/18 0338 08/12/18 2251 08/13/18 0318  WBC 14.1*   < > 13.3*  --   --  14.6* 11.3* 10.1 10.8*  NEUTROABS 10.8*  --   --   --   --   --   --  7.8* 7.7  HGB 13.2   < > 13.2   < > 11.9* 14.0 11.4* 11.6* 11.7*  HCT 39.6   < > 41.3   < > 35.0* 42.2 34.6* 35.9* 36.1  MCV 85.3   < > 86.9  --   --  85.6 86.7 86.5 86.2  PLT 329   < > 327  --   --  319 311 310 349   < > = values in this interval not displayed.    Basic Metabolic Panel: Recent Labs  Lab 08/09/18 1900  08/10/18 0340  08/10/18 0911 08/10/18 1902 08/11/18 0429 08/11/18 0600 08/12/18 0338 08/13/18 0318 08/13/18 0858  NA 137   < > 137   < > 134*  --  132* 136 138  --  137  K 3.3*   < > 4.3   < > 4.0  --  3.5 4.0 3.7  --  3.2*  CL 100  --  97*  --   --   --   --  93* 86*  --  90*  CO2 24  --  25  --   --   --   --  28 30  --  29  GLUCOSE 163*  --  153*  --   --   --   --  234* 148*  --  167*  BUN 28*  --  28*  --   --   --   --  30* 28*  --  27*  CREATININE 1.35*  --  1.32*  --   --    --   --  1.43* 1.78*  --  1.79*  CALCIUM 8.5*  --  8.7*  --   --   --   --  9.3 9.4  --  9.3  MG 1.6*  --  1.7  --   --  2.1  --  1.9 1.7 1.9  --   PHOS 2.8  --  3.2  --   --  2.5  --  3.3 6.0* 4.3  --    < > =  values in this interval not displayed.   GFR: Estimated Creatinine Clearance: 23.5 mL/min (A) (by C-G formula based on SCr of 1.79 mg/dL (H)). Recent Labs  Lab 08/07/18 0018 08/07/18 0239  08/10/18 1332 08/10/18 2045 08/11/18 0600 08/12/18 0338 08/12/18 2251 08/13/18 0318  WBC  --  13.1*   < >  --   --  14.6* 11.3* 10.1 10.8*  LATICACIDVEN 4.3* 5.7*  --  2.2* 1.7  --   --   --   --    < > = values in this interval not displayed.    Liver Function Tests: Recent Labs  Lab 08/07/18 0239  AST 24  ALT 15  ALKPHOS 30*  BILITOT 0.8  PROT 7.5  ALBUMIN 2.6*   No results for input(s): LIPASE, AMYLASE in the last 168 hours. No results for input(s): AMMONIA in the last 168 hours.  ABG    Component Value Date/Time   PHART 7.521 (H) 08/11/2018 0429   PCO2ART 39.2 08/11/2018 0429   PO2ART 57.0 (L) 08/11/2018 0429   HCO3 32.1 (H) 08/11/2018 0429   TCO2 33 (H) 08/11/2018 0429   ACIDBASEDEF 8.0 (H) 08/10/2018 0858   O2SAT 54.5 08/13/2018 1022     Coagulation Profile: No results for input(s): INR, PROTIME in the last 168 hours.  Cardiac Enzymes: No results for input(s): CKTOTAL, CKMB, CKMBINDEX, TROPONINI in the last 168 hours.  HbA1C: Hgb A1c MFr Bld  Date/Time Value Ref Range Status  08/06/2018 02:00 PM 10.2 (H) 4.8 - 5.6 % Final    Comment:    (NOTE) Pre diabetes:          5.7%-6.4% Diabetes:              >6.4% Glycemic control for   <7.0% adults with diabetes    CBG: Recent Labs  Lab 08/13/18 0326 08/13/18 0419 08/13/18 0800 08/13/18 1155 08/13/18 1645  GLUCAP 117* 111* 166* 174* 163*   The patient is critically ill but improving with  organ systems failure and requires high complexity decision making for assessment and support, frequent evaluation  and titration of therapies, application of advanced monitoring technologies and extensive interpretation of multiple databases.   Critical Care Time devoted to patient care services described in this note is 35  Minutes. This time reflects time of care of this signee Dr. Irene Pap.   Marland Kitchen

## 2018-08-13 NOTE — Progress Notes (Signed)
eLink Physician-Brief Progress Note Patient Name: [name] DOB: [birth year] MRN: [id number]   Date of Service  08/13/2018  HPI/Events of Note  A 68 year old female admitted to the ICU because of her NSTEMI.  Potassium 3.2 on high flow nasal cannula with on and off BiPAP.   eICU Interventions  Ordered 20 meq of IV potassium chloride.     Intervention Category Intermediate Interventions: Medication change / dose adjustment;Electrolyte abnormality - evaluation and management  Mady Gemma 08/13/2018, 11:58 PM

## 2018-08-13 NOTE — Progress Notes (Signed)
Patient ID: [name], female   DOB: Oct 22, 1950, 68 y.o.   MRN: [id number]     Advanced Heart Failure Rounding Note  PCP-Cardiologist: No primary care provider on file.   Subjective:    2/9 Admitted 2/9 with [place] and respiratory distress 2/10 PEA arrest 2/13 Underwent PCI of LAD 2/13 self-extubated  Milrinone stopped yesterday.  No co-ox or BMET done this morning.  CVP 9-10 on my measure.  CXR with pulmonary edema but also patchy airspace disease.   Denies chest pain or dyspnea at rest.  Now on 4L [place].   RHC 08/10/18  LAD 80%---> DES LAD    RA 10  PA 60/28 (43) PCWP 29  CI 1.8   Echo 08/06/2018 - EF 25% , RV normal mild TR, and severe HK.   Objective:   Weight Range: 60.1 kg Body mass index is 24.23 kg/m.   Vital Signs:   Temp:  [97 F (36.1 C)-97.8 F (36.6 C)] 97.4 F (36.3 C) (02/16 0803) Pulse Rate:  [75-120] 88 (02/16 0800) Resp:  [15-36] 22 (02/16 0800) BP: (92-133)/(55-98) 98/65 (02/16 0800) SpO2:  [90 %-100 %] 100 % (02/16 0800) FiO2 (%):  [40 %] 40 % (02/16 0345) Weight:  [60.1 kg] 60.1 kg (02/16 0317) Last BM Date: 08/12/18  Weight change: Filed Weights   08/11/18 0739 08/12/18 0500 08/13/18 0317  Weight: 64.6 kg 61.9 kg 60.1 kg    Intake/Output:   Intake/Output Summary (Last 24 hours) at 08/13/2018 0859 Last data filed at 08/12/2018 1800 Gross per 24 hour  Intake 380.93 ml  Output 400 ml  Net -19.07 ml      Physical Exam    CVP 9-10 personally checked  General: NAD Neck: JVP 8 cm, no thyromegaly or thyroid nodule.  Lungs: Coarse BS CV: Nondisplaced PMI.  Heart regular S1/S2, no S3/S4, no murmur.  No peripheral edema.  No carotid bruit.  Normal pedal pulses.  Abdomen: Soft, nontender, no hepatosplenomegaly, no distention.  Skin: Intact without lesions or rashes.  Neurologic: Alert and oriented x 3.  Psych: Normal affect. Extremities: No clubbing or cyanosis.  HEENT: Normal.    Telemetry   NSR 100s, personally reviewed.     EKG    N/a   Labs    CBC Recent Labs    08/12/18 2251 08/13/18 0318  WBC 10.1 10.8*  NEUTROABS 7.8* 7.7  HGB 11.6* 11.7*  HCT 35.9* 36.1  MCV 86.5 86.2  PLT 310 989   Basic Metabolic Panel Recent Labs    08/11/18 0600 08/12/18 0338 08/13/18 0318  NA 136 138  --   K 4.0 3.7  --   CL 93* 86*  --   CO2 28 30  --   GLUCOSE 234* 148*  --   BUN 30* 28*  --   CREATININE 1.43* 1.78*  --   CALCIUM 9.3 9.4  --   MG 1.9 1.7 1.9  PHOS 3.3 6.0* 4.3   Liver Function Tests No results for input(s): AST, ALT, ALKPHOS, BILITOT, PROT, ALBUMIN in the last 72 hours. No results for input(s): LIPASE, AMYLASE in the last 72 hours. Cardiac Enzymes No results for input(s): CKTOTAL, CKMB, CKMBINDEX, TROPONINI in the last 72 hours.  BNP: BNP (last 3 results) Recent Labs    08/06/18 0628  BNP 1,535.2*    ProBNP (last 3 results) No results for input(s): PROBNP in the last 8760 hours.   D-Dimer No results for input(s): DDIMER in the last 72 hours. Hemoglobin A1C No  results for input(s): HGBA1C in the last 72 hours. Fasting Lipid Panel No results for input(s): CHOL, HDL, LDLCALC, TRIG, CHOLHDL, LDLDIRECT in the last 72 hours. Thyroid Function Tests No results for input(s): TSH, T4TOTAL, T3FREE, THYROIDAB in the last 72 hours.  Invalid input(s): FREET3  Other results:   Imaging    Dg Chest Port 1 View  Result Date: 08/13/2018 CLINICAL DATA:  Pulmonary edema EXAM: PORTABLE CHEST 1 VIEW COMPARISON:  August 11, 2018 FINDINGS: Swan-Ganz catheter has been removed. Cordis tip is in the superior vena cava near the junction with the right jugular vein. Peripherally inserted central catheter tip is at the cavoatrial junction. No pneumothorax. There is cardiomegaly with mild pulmonary venous hypertension. There are pleural effusions bilaterally with interstitial edema in the lower lobe regions. There is patchy airspace opacity bilaterally, primarily in the mid and lower lung zone regions.  There is consolidation in the medial bases. No adenopathy evident. No bone lesions. IMPRESSION: Catheter positions as described without pneumothorax. Pulmonary vascular congestion with interstitial edema and small pleural effusions. Suspect a degree of congestive heart failure. Patchy areas of airspace opacity bilaterally raise question of multifocal pneumonia versus alveolar edema. Both entities may be present concurrently. It should be noted that a degree of superimposed ARDS is also possible. Electronically Signed   By: Lowella Grip III M.D.   On: 08/13/2018 07:39   [place] Ekg Site Rite  Result Date: 08/12/2018 If Site Rite image not attached, placement could not be confirmed due to current cardiac rhythm.    Medications:     Scheduled Medications: . aspirin  81 mg Per Tube Daily  . atorvastatin  40 mg Per Tube q1800  . Chlorhexidine Gluconate Cloth  6 each Topical Daily  . digoxin  0.125 mg Oral Daily  . heparin injection (subcutaneous)  5,000 Units Subcutaneous Q8H  . insulin aspart  0-20 Units Subcutaneous Q4H  . losartan  12.5 mg Oral QHS  . mouth rinse  15 mL Mouth Rinse BID  . multivitamin with minerals  1 tablet Oral Daily  . pantoprazole (PROTONIX) IV  40 mg Intravenous Q24H  . senna  2 tablet Per Tube Daily  . sodium chloride flush  10-40 mL Intracatheter Q12H  . spironolactone  25 mg Oral Daily  . ticagrelor  90 mg Oral BID  . torsemide  20 mg Oral Daily    Infusions:   PRN Medications: acetaminophen **OR** acetaminophen, albuterol, ondansetron **OR** ondansetron (ZOFRAN) IV, sodium chloride flush, sodium chloride flush    Patient Profile   [name] is a 68 year old with a history of DMII, HTN, and smoker admitted with increased shortness of breath and chest pain. Troponin 7.5>6.5>6.3 and BNP 1535. CXR was concerning for pulmonary edema so IV lasix was given. Taken to the cath lab on 2/10 but this was aborted due to PEA arrest. Assessment/Plan   1. Acute  Hypoxic Respiratory Failure--> PEA arrest  - Intubated on 2/10 with CCM managing. Self extubated overnight. Now on 4L [place].    - CXR with patchy infiltrates and mild pulmonary edema and still with significant oxygen requirement.  CVP still not markedly high but up to 9-10 today.  ? Component of ARDS or more likely aspiration with PEA arrest (and baseline COPD).  No fever and WBCs not significantly elevated. CCM following.  She is not on antibiotics.  - See below, will give IV Lasix today.   2. PEA arrest on 2/10  - due to hypoxia in  setting of severe COPD and CHF  3. Acute systolic HR -> Cardiogenic Shock in the setting of NSTEMI/PEA arrest  - Suspect mixed ICM. NICM. S/p PCI  80% LAD on 12/15. ECHO 2/9 EF 20-25%  - Initial hemodynamics c/w shock. Now improving and off milrinone.  She remains on digoxin.  Check co-ox today off milrinone, will check digoxin level in am. - Continue current spironolactone and losartan, no BP room to increase and no BMET yet today (send stat). - CVP 9-10 today, will give Lasix 40 mg IV bid.   4. Pulmonary Hypertension  - Mostly pulmonary venous HTN.  PVR 4.7 No formal diagnosis of COPD but has long history of smoking.  - PA pressure better on milrinone.    4. NSTEMI  - peak troponin 7.5 - 08/10/18 LHC with DES to 80% mLAD - Now passed swallow and on ticagrelor + ASA.   5. DMII -On SS per primary  - Hgb A 1 C 10.2   Length of Stay: [place], [place]  8:59 AM Advanced Heart Failure Team Pager [phone] (M-F; 7a - 4p)  Please contact [facility] for night-coverage after hours (4p -7a ) and weekends on [email or web address]

## 2018-08-13 NOTE — Progress Notes (Signed)
Patient refused to wear BiPAP even after Rt told her it was advised. The patient is stable wearing a 2 L [place]. HR 92 and SpO2 96%. Rt will continue to monitor throughout the night.

## 2018-08-13 NOTE — Progress Notes (Signed)
  Speech Language Pathology Treatment: Dysphagia  Patient Details Name: [name] MRN: [id number] DOB: [birth year] Today's Date: 08/13/2018 Time: 5573-2202 SLP Time Calculation (min) (ACUTE ONLY): 25 min  Assessment / Plan / Recommendation Clinical Impression  ST follow up for therapeutic diet tolerance.  Nursing reported no obvious issues with intake.  Chart review indicated that she has been afebrile, and lungs have been clear but diminished.  Chest xray dated 08/13/2018 was showing pulmonary vascular congestion with interstitial edema, likely small pleural effusion, suspect CHF.  In addition, patchy areas of airspace opacities seen bilaterally that are questionably multifocal PNA vs alveolar edema.   The patient was seen sitting upright in her bedside chair.  She easily gets short of breath with minimal activity.  Meal observation was completed using dry solids and nectar thick liquids.  No overt s/s of aspiration were seen.  Recommend that she continue on current diet pending MBS next date.     HPI HPI: Pt is a 68 yo female admitted 2/9 with NSTEMI. PEA arrest on 2/10. Pt was intubated on 2/10, self-extubated 2/14. Cardiac cath 2/13. PMH: CAD, HTN, DM, tobacco abuse.  CXR 2/14 concerning for bibasilar atelectasis or infiltrates.      SLP Plan  New goals to be determined pending instrumental study       Recommendations  Diet recommendations: Regular;Nectar-thick liquid Liquids provided via: Cup;No straw Medication Administration: Whole meds with puree Supervision: Patient able to self feed Compensations: Slow rate;Small sips/bites Postural Changes and/or Swallow Maneuvers: Seated upright 90 degrees                Oral Care Recommendations: Oral care BID Follow up Recommendations: Other (comment)(TBD) SLP Visit Diagnosis: Dysphagia, oropharyngeal phase (R13.12) Plan: New goals to be determined pending instrumental study       [place], MA,  CCC-SLP Acute Rehab SLP [phone]  Lamar Sprinkles 08/13/2018, 11:48 AM

## 2018-08-13 NOTE — Evaluation (Signed)
Physical Therapy Evaluation Patient Details Name: [name] MRN: [id number] DOB: 03-21-1951 Today's Date: 08/13/2018   History of Present Illness  Pt is a 68 y.o. F with significant PMH of CAD, HTN, DM, tobacco abuse. Admitted 2/9 with NSTEMI, PEA on 2/10. Intubated 2/10-2/14.  Clinical Impression  Pt admitted with above diagnosis. Pt currently with functional limitations due to the deficits listed below (see PT Problem List). Prior to admission, pt independent with mobility using a cane and enjoys walking around her apartment complex. On PT evaluation, pt presenting with decreased mobility secondary to decreased cardiovascular endurance. Requiring min guard and walker for transfer from bed to chair. Suspect pt will progress very well and will increase ambulation during next session. Will also need to trial stairs prior to discharge as pt has 10 steps to enter her apartment. Pt will benefit from skilled PT to increase their independence and safety with mobility to allow discharge to the venue listed below.    Vitals:  90-99% SpO2 on 4L O2 BP 107/60, stable HR     Follow Up Recommendations Home health PT;Supervision for mobility/OOB    Equipment Recommendations  Other (comment)(rollator?)    Recommendations for Other Services   OT consult    Precautions / Restrictions Precautions Precautions: Fall Restrictions Weight Bearing Restrictions: No      Mobility  Bed Mobility Overal bed mobility: Needs Assistance Bed Mobility: Supine to Sit     Supine to sit: Min assist     General bed mobility comments: min assist for trunk elevation  Transfers Overall transfer level: Needs assistance Equipment used: Rolling walker (2 wheeled) Transfers: Sit to/from [facility] Sit to Stand: Min guard Stand pivot transfers: Min guard;+2 safety/equipment       General transfer comment: Pt performing stand pivot transfer from bed to chair with min guard, assist for line  management  Ambulation/Gait                Stairs            Wheelchair Mobility    Modified Rankin (Stroke Patients Only)       Balance Overall balance assessment: Mild deficits observed, not formally tested                                           Pertinent Vitals/Pain Pain Assessment: No/denies pain    Home Living Family/patient expects to be discharged to:: Private residence Living Arrangements: Spouse/significant other(wife) Available Help at Discharge: Family Type of Home: Apartment Home Access: Stairs to enter   Technical brewer of Steps: 10   Home Equipment: Cane - single point      Prior Function Level of Independence: Independent with assistive device(s)         Comments: uses cane due to a "bad leg."     Hand Dominance        Extremity/Trunk Assessment   Upper Extremity Assessment Upper Extremity Assessment: Overall WFL for tasks assessed    Lower Extremity Assessment Lower Extremity Assessment: Overall WFL for tasks assessed    Cervical / Trunk Assessment Cervical / Trunk Assessment: Normal  Communication   Communication: No difficulties  Cognition Arousal/Alertness: Awake/alert Behavior During Therapy: WFL for tasks assessed/performed Overall Cognitive Status: Within Functional Limits for tasks assessed  General Comments      Exercises     Assessment/Plan    PT Assessment Patient needs continued PT services  PT Problem List Decreased strength;Decreased activity tolerance;Decreased balance;Decreased mobility;Cardiopulmonary status limiting activity       PT Treatment Interventions DME instruction;Gait training;Functional mobility training;Stair training;Therapeutic activities;Therapeutic exercise;Balance training;Patient/family education    PT Goals (Current goals can be found in the Care Plan section)  Acute Rehab PT Goals Patient  Stated Goal: "walk." PT Goal Formulation: With patient Time For Goal Achievement: 08/27/18 Potential to Achieve Goals: Good    Frequency Min 3X/week   Barriers to discharge        Co-evaluation               AM-PAC PT "6 Clicks" Mobility  Outcome Measure Help needed turning from your back to your side while in a flat bed without using bedrails?: None Help needed moving from lying on your back to sitting on the side of a flat bed without using bedrails?: A Little Help needed moving to and from a bed to a chair (including a wheelchair)?: A Little Help needed standing up from a chair using your arms (e.g., wheelchair or bedside chair)?: A Little Help needed to walk in hospital room?: A Little Help needed climbing 3-5 steps with a railing? : A Lot 6 Click Score: 18    End of Session Equipment Utilized During Treatment: Gait belt;Oxygen Activity Tolerance: Patient tolerated treatment well Patient left: in chair;with call bell/phone within reach;with family/visitor present Nurse Communication: Mobility status PT Visit Diagnosis: Difficulty in walking, not elsewhere classified (R26.2)    Time: 5188-4166 PT Time Calculation (min) (ACUTE ONLY): 18 min   Charges:   PT Evaluation $PT Eval Moderate Complexity: [place], PT, DPT Acute Rehabilitation Services Pager [phone] Office [phone]  Willy Eddy 08/13/2018, 3:49 PM

## 2018-08-13 NOTE — Progress Notes (Signed)
Modified Barium Swallow Progress Note  Patient Details  Name: [name] MRN: [id number] Date of Birth: 1951/02/16  Today's Date: 08/13/2018  Modified Barium Swallow completed.  Full report located under Chart Review in the Imaging Section.  Brief recommendations include the following:  Clinical Impression  MBS was completed using thin liquids via spoon, nectar thick liquids via spoon and cup, honey thick liquids via cup and soft solids .  The patient presented with a mild oral and moderate pharyngeal dysphagia.    The oral phase was marked by good lingual control but delayed a/p transport of the bolus particularly given solids.  The pharyngeal phase was marked by decreased hyolaryngeal movement, intermittently decreased epiglottic movement, decreased base of tongue retraction and decreased laryngeal vestibule closure.  These deficits led to vallecula residue.  In addition, aspiration prior to the swallow with a cough response that was ineffective to clear material from the airway was seen given thin liquids via spoon sips.  Aspiration into the airway with clearance of most of the material at completion of the swallow was seen prior to the swallow given nectar thick liquids.   Trace intermittent penetration into the laryngeal vestibule was seen given pureed material and soft solids.   Recommend downgrading diet to dysphagia 3 (mechanical soft) with honey thick liquids.  Oral care should be completed prior o PO intake to mitigate aspiration risk.  ST will follow for therapeutic diet tolerance and to initiate swallowng therapy.     Swallow Evaluation Recommendations       SLP Diet Recommendations: Honey thick liquids;Dysphagia 3 (Mech soft) solids   Liquid Administration via: Cup;No straw   Medication Administration: Crushed with puree   Supervision: Staff to assist with self feeding   Compensations: Slow rate;Small sips/bites   Postural Changes: Seated upright at 90 degrees   Oral Care  Recommendations: Oral care BID;Oral care before and after PO   Other Recommendations: Have oral suction available   Shelly Flatten, MA, CCC-SLP Acute Rehab SLP [phone] Sprinkles 08/13/2018,3:35 PM

## 2018-08-14 ENCOUNTER — Inpatient Hospital Stay (HOSPITAL_COMMUNITY): Payer: Medicare HMO

## 2018-08-14 DIAGNOSIS — E878 Other disorders of electrolyte and fluid balance, not elsewhere classified: Secondary | ICD-10-CM

## 2018-08-14 LAB — CBC WITH DIFFERENTIAL/PLATELET
ABS IMMATURE GRANULOCYTES: 0.08 10*3/uL — AB (ref 0.00–0.07)
Basophils Absolute: 0 10*3/uL (ref 0.0–0.1)
Basophils Relative: 0 %
Eosinophils Absolute: 0.1 10*3/uL (ref 0.0–0.5)
Eosinophils Relative: 1 %
HCT: 39.2 % (ref 36.0–46.0)
HEMOGLOBIN: 12.6 g/dL (ref 12.0–15.0)
Immature Granulocytes: 1 %
LYMPHS ABS: 1.6 10*3/uL (ref 0.7–4.0)
Lymphocytes Relative: 15 %
MCH: 28 pg (ref 26.0–34.0)
MCHC: 32.1 g/dL (ref 30.0–36.0)
MCV: 87.1 fL (ref 80.0–100.0)
Monocytes Absolute: 1.4 10*3/uL — ABNORMAL HIGH (ref 0.1–1.0)
Monocytes Relative: 13 %
NRBC: 0 % (ref 0.0–0.2)
Neutro Abs: 7.7 10*3/uL (ref 1.7–7.7)
Neutrophils Relative %: 70 %
Platelets: 348 10*3/uL (ref 150–400)
RBC: 4.5 MIL/uL (ref 3.87–5.11)
RDW: 14 % (ref 11.5–15.5)
WBC: 10.8 10*3/uL — ABNORMAL HIGH (ref 4.0–10.5)

## 2018-08-14 LAB — BASIC METABOLIC PANEL
Anion gap: 13 (ref 5–15)
BUN: 24 mg/dL — ABNORMAL HIGH (ref 8–23)
CO2: 34 mmol/L — ABNORMAL HIGH (ref 22–32)
Calcium: 9.2 mg/dL (ref 8.9–10.3)
Chloride: 90 mmol/L — ABNORMAL LOW (ref 98–111)
Creatinine, Ser: 1.56 mg/dL — ABNORMAL HIGH (ref 0.44–1.00)
GFR calc Af Amer: 39 mL/min — ABNORMAL LOW (ref >60)
GFR, EST NON AFRICAN AMERICAN: 34 mL/min — AB (ref >60)
Glucose, Bld: 101 mg/dL — ABNORMAL HIGH (ref 70–99)
Potassium: 5 mmol/L (ref 3.5–5.1)
Sodium: 137 mmol/L (ref 135–145)

## 2018-08-14 LAB — GLUCOSE, CAPILLARY
GLUCOSE-CAPILLARY: 182 mg/dL — AB (ref 70–99)
Glucose-Capillary: 130 mg/dL — ABNORMAL HIGH (ref 70–99)
Glucose-Capillary: 93 mg/dL (ref 70–99)
Glucose-Capillary: 95 mg/dL (ref 70–99)
Glucose-Capillary: 175 mg/dL — ABNORMAL HIGH (ref 70–99)
Glucose-Capillary: 185 mg/dL — ABNORMAL HIGH (ref 70–99)
Glucose-Capillary: 99 mg/dL (ref 70–99)

## 2018-08-14 LAB — MAGNESIUM: Magnesium: 1.9 mg/dL (ref 1.7–2.4)

## 2018-08-14 LAB — DIGOXIN LEVEL: Digoxin Level: 0.6 ng/mL — ABNORMAL LOW (ref 0.8–2.0)

## 2018-08-14 LAB — PHOSPHORUS: PHOSPHORUS: 4.1 mg/dL (ref 2.5–4.6)

## 2018-08-14 NOTE — Progress Notes (Addendum)
  Speech Language Pathology Treatment: Dysphagia  Patient Details Name: [name] MRN: [id number] DOB: 1950/07/30 Today's Date: 08/14/2018 Time: 3225-6720 SLP Time Calculation (min) (ACUTE ONLY): 15 min  Assessment / Plan / Recommendation Clinical Impression  Pt, with dys 3, honey- thick liquid lunch tray, seen for diet tolerance and upgrade. Pt reported tolerance of honey liquids and overall improvement in vocal quality, but voice remains dysphonic. Pt observed to cough x1 with liquids, but otherwise tolerated as reported. Coughing x3 noted during dys 3 solid intake and pt denied food stuck in throat or choking. Given prolonged intubation, dysphonic vocal quality and intermittent coughing with solids and liquids, recommend continuation of dys 3, honey-thick liquid diet. Diet advancement indicated with improved vocal quality and reduced coughing during meals. Will f/u for tolerance.   HPI HPI: Pt is a 68 yo female admitted 2/9 with NSTEMI. PEA arrest on 2/10. Pt was intubated on 2/10, self-extubated 2/14. Cardiac cath 2/13. PMH: CAD, HTN, DM, tobacco abuse.  CXR 2/14 concerning for bibasilar atelectasis or infiltrates.      SLP Plan  Continue with current plan of care       Recommendations  Diet recommendations: Dysphagia 3 (mechanical soft);Honey-thick liquid Liquids provided via: Cup;No straw Medication Administration: Whole meds with puree Supervision: Patient able to self feed Compensations: Slow rate;Small sips/bites Postural Changes and/or Swallow Maneuvers: Seated upright 90 degrees                Oral Care Recommendations: Oral care BID Follow up Recommendations: Inpatient Rehab;Skilled Nursing facility;Home health SLP SLP Visit Diagnosis: Dysphagia, oropharyngeal phase (R13.12) Plan: Continue with current plan of care       GO                [name], Student SLP 08/14/2018, 1:03 PM

## 2018-08-14 NOTE — Progress Notes (Signed)
1315-1400 PT to walk with pt so started ed with pt and significant other. Pt stated she is a little foggy so will need reinforcement of ed. Reviewed importance of brilinta with stent. Reviewed NTG use, MI restrictions, smoking cessation and CRP 2. Referred to [facility] program. Pt has not smoked in two weeks. Gave smoking cessation handout and encouraged to call 1800quitnow if needed.Significant other able to answer teachback questions when pt could not. Graylon Good RN BSN 08/14/2018 1:59 PM

## 2018-08-14 NOTE — Care Management Note (Signed)
Case Management Note  Patient Details  Name: [name] MRN: [id number] Date of Birth: January 24, 1951  Subjective/Objective:  68 yo female presented with NSTEMI, PEA on 2/10; s/p LHC on 2/13; PMH: CAD, HTN, DM, tobacco abuse.               Action/Plan: CM met to discuss dispositional needs. Patient lived at home with her spouse and was independent with her ADLs PTA; occasionally uses her [place] during long distances. PCP verified as: Dr. [name]; pharmacy of choice: [facility] order[facility] Brilinta benefits check sent and complete with est monthly copay cost $3.90; Patient would like to use [facility] pharmacy for her Rx needs prior to transitioning home. PT eval complete with HHPT/rollator recommended. CMS HH compare list provided with [facility] selected for her HH/DME needs. Patient will need orders for HHPT/rollator and a F2 to arrange services; patients rollator will be delivered prior to discharge. [place] referral given to [name] RN, [facility] liaison; AVS updated. Patients wife will assist with needs post-discharge. CM team will continue to follow.   Expected Discharge Date:                  Expected Discharge Plan:  [place]  In-House Referral:  NA  Discharge planning Services  CM Consult, Medication Assistance(Brilinta benefits check)  Post Acute Care Choice:  Durable Medical Equipment, Home Health Choice offered to:  Patient, Spouse  DME Arranged:  Walker rolling with seat DME Agency:  [place]:  PT [place]:  [place]  Status of Service:  In process, will continue to follow  If discussed at Long Length of Stay Meetings, dates discussed:    Additional Comments:  [name] RN, BSN, NCM-BC, ACM-RN [phone] 08/14/2018, 11:26 AM

## 2018-08-14 NOTE — Progress Notes (Addendum)
Nutrition Follow Up   DOCUMENTATION CODES:   Not applicable  INTERVENTION:    Magic cup TID with meals, each supplement provides 290 kcal and 9 grams of protein  If PO intake does not improve rec Cortrak tube placement for EN support  NUTRITION DIAGNOSIS:   Inadequate oral intake now related to dysphagia as evidenced by meal completion < 25%, ongoing  GOAL:   Patient will meet greater than or equal to 90% of their needs, unmet  MONITOR:   PO intake, Supplement acceptance, Labs, Skin, Weight trends, I & O's  ASSESSMENT:   68 year old female who presented to the ED on 2/9 with N/V. PMH significant for T2DM requiring insulin, HTN. Pt found to have NSTEMI. Pt was brought into cath lab for planned cardiac on 2/10 and experienced a PEA cardiac arrest. CPR initiated. Pt intubated on 2/10.  2/13 s/p cardiac cath 2/14 self extubated  S/p bedside swallow evaluation 2/14; SLP rec NPO status. Cortrak tube placement ordered, however, pt advanced to PO diet 2/15. S/p MBSS 2/16; mild to moderate dysphagia; SLP rec Dys 3-honey liquids.  PO intake has been poor at 10% per flowsheet records. Medications reviewed; MVI ordered daily 2/15. Labs reviewed. Mg, Phos, K+ WNL.  CBG's [phone].  Diet Order:   Diet Order            DIET DYS 3 Room service appropriate? Yes; Fluid consistency: Honey Thick  Diet effective now             EDUCATION NEEDS:    Not appropriate for education at this time  Skin:  Skin Assessment: Reviewed RN Assessment  Last BM:  2/17    Intake/Output Summary (Last 24 hours) at 08/14/2018 1351 Last data filed at 08/14/2018 1241 Gross per 24 hour  Intake -  Output 1152 ml  Net -1152 ml   Height:   Ht Readings from Last 1 Encounters:  08/06/18 5\' 2"  (1.575 m)   Weight:   Wt Readings from Last 1 Encounters:  08/14/18 59.3 kg   Ideal Body Weight:  50 kg  BMI:  Body mass index is 23.91 kg/m.  Estimated Nutritional Needs:   Kcal:   1800-2000  Protein:  90-105 gm  Fluid:  per MD  Arthur Holms, RD, LDN Pager #: [phone] After-Hours Pager #: [phone]

## 2018-08-14 NOTE — Progress Notes (Signed)
NAME:  [name], MRN:  [id number], DOB:  1951-06-15, LOS: 47 ADMISSION DATE:  08/06/2018,  CONSULTATION DATE: 08/07/2018 REFERRING MD: Cardiology, CHIEF COMPLAINT: Respiratory failure status post PEA cardiac arrest  Brief History   68 year old female with coronary artery disease status post PEA arrest on 08/07/2018  History of present illness   68 year old female with history of coronary disease, lifelong smoker, diabetes mellitus who was admitted 08/06/2018 with a non-ST elevation MI.  He was initially mated to the [facility] service.  She was scheduled for left heart catheterization on 08/07/2018.  While being placed on table she had a PEA arrest that lasted for 4 minutes of CPR and 1 round of epinephrine.  He had return of spontaneous circulation was awake alert but looked in acute discomfort.  She was not mentating well was not moving oxygen.  She had a profound acidosis and was transferred to the heart unit where she was urgently intubated and placed on full mechanical ventilatory support.  Heart catheterization has been postponed at this time.  Her CBG was noted to be greater than 200 in the Cath Lab.  Pulmonary critical care is asked to assume her care at this time.  Past Medical History  Coronary artery disease Hypertension Diabetes Tobacco abuse  Significant Hospital Events   08/07/2018 PEA arrest CPR for 4 minutes 1 round of epi with return of spontaneous circulation 2/13> cardiac cath, LAD stent  2.14.2020: Self-extubated. Placed on bipap without failure  Consults:  08/06/2008 cardiology 08/07/2008 pulmonary critical care  Procedures:  08/07/2018 intubation>> 2/13> cardiac cath  Significant Diagnostic Tests:   CXR 2/12> interval increase in bilateral opacities, R>L  CXR 2/13> bilateral infiltrates, slightly improved from prior however still persistent pulm edema  CXR 2/14: Improving but still mixed alveolar -interstitial pattern  Micro Data:  2/9 BCx NGTD 2/10 resp cx: no  organisms seen  2/10- RVP: neg 2/10 MRSA: neg   Antimicrobials:  None  Interim history/subjective:  Remained on BiPAP overnight. Stable this morning on [place].  Objective   Blood pressure 99/60, pulse 70, temperature (!) 97.4 F (36.3 C), temperature source Oral, resp. rate (!) 25, height 5\' 2"  (1.575 m), weight 59.3 kg, SpO2 97 %.        Intake/Output Summary (Last 24 hours) at 08/14/2018 1903 Last data filed at 08/14/2018 1241 Gross per 24 hour  Intake -  Output 951 ml  Net -951 ml   Filed Weights   08/12/18 0500 08/13/18 0317 08/14/18 0420  Weight: 61.9 kg 60.1 kg 59.3 kg   Physical Exam: General: Older than stated age, sitting in chair no acute distress HENT: [place], AT, OP clear, MMM Eyes: EOMI, no scleral icterus Respiratory: Clear to auscultation bilaterally.  No crackles, wheezing or rales Cardiovascular: RRR, -M/R/G, no JVD GI: BS+, soft, nontender Extremities:-Edema,-tenderness Neuro: AAO x4, CNII-XII grossly intact Skin: Intact, no rashes or bruising Psych: Normal mood, normal affect   Resolved Hospital Problem list   Cardiogenic shock Assessment & Plan:   Hypoxic respiratory failure secondary to pulmonary edema in setting of cardiogenic shock s/p self-extubation 2/16 P -Diuresis as needed -BiPap nightly  S/p PEA arrest secondary to hypoxemia Cardiogenic shock -Post 6 minutes PEA arrest requiring 4 minutes of CPR and 1 amp of epinephrine on 08/07/2018 while inCath Lab and cardiac cath was not completed at that time -Underwent PCI of LAD on 2/13 P - ASA and ticagrelor for cardiac stent  Electrolyte abnormality -Hypomagnesemia -Hypophosphatemia -at risk hypokalemia given diuresis  P -  Monitor BMP. Mag phos - Replete PRN   AKI- improving -Serial BMP  Diabetes Mellitus - Sliding scale insulin  Malnutrition, at risk - Dysphagia diet  Best practice:  Diet:Yes Pain/Anxiety/Delirium protocol (if indicated): VAP protocol (if indicated): N/A DVT  prophylaxis: Heparin gtt  GI prophylaxis: Stopped Glucose control: Sliding scale insulin protocol Mobility: Start PT Code Status: Full Family Communication: Discussed with family and patient at bedside Disposition: Transfer to [facility], step down  Labs   CBC: Recent Labs  Lab 08/08/18 0457  08/11/18 0600 08/12/18 0338 08/12/18 2251 08/13/18 0318 08/14/18 0411  WBC 14.1*   < > 14.6* 11.3* 10.1 10.8* 10.8*  NEUTROABS 10.8*  --   --   --  7.8* 7.7 7.7  HGB 13.2   < > 14.0 11.4* 11.6* 11.7* 12.6  HCT 39.6   < > 42.2 34.6* 35.9* 36.1 39.2  MCV 85.3   < > 85.6 86.7 86.5 86.2 87.1  PLT 329   < > 319 311 310 349 348   < > = values in this interval not displayed.    Basic Metabolic Panel: Recent Labs  Lab 08/10/18 1902  08/11/18 0600 08/12/18 0338 08/13/18 0318 08/13/18 0858 08/13/18 2142 08/14/18 0411  NA  --    < > 136 138  --  137 138 137  K  --    < > 4.0 3.7  --  3.2* 3.2* 5.0  CL  --   --  93* 86*  --  90* 89* 90*  CO2  --   --  28 30  --  29 33* 34*  GLUCOSE  --   --  234* 148*  --  167* 99 101*  BUN  --   --  30* 28*  --  27* 25* 24*  CREATININE  --   --  1.43* 1.78*  --  1.79* 1.55* 1.56*  CALCIUM  --   --  9.3 9.4  --  9.3 9.4 9.2  MG 2.1  --  1.9 1.7 1.9  --   --  1.9  PHOS 2.5  --  3.3 6.0* 4.3  --   --  4.1   < > = values in this interval not displayed.   GFR: Estimated Creatinine Clearance: 26.9 mL/min (A) (by C-G formula based on SCr of 1.56 mg/dL (H)). Recent Labs  Lab 08/10/18 1332 08/10/18 2045  08/12/18 0338 08/12/18 2251 08/13/18 0318 08/14/18 0411  WBC  --   --    < > 11.3* 10.1 10.8* 10.8*  LATICACIDVEN 2.2* 1.7  --   --   --   --   --    < > = values in this interval not displayed.    Liver Function Tests: No results for input(s): AST, ALT, ALKPHOS, BILITOT, PROT, ALBUMIN in the last 168 hours. No results for input(s): LIPASE, AMYLASE in the last 168 hours. No results for input(s): AMMONIA in the last 168 hours.  ABG    Component Value  Date/Time   PHART 7.521 (H) 08/11/2018 0429   PCO2ART 39.2 08/11/2018 0429   PO2ART 57.0 (L) 08/11/2018 0429   HCO3 32.1 (H) 08/11/2018 0429   TCO2 33 (H) 08/11/2018 0429   ACIDBASEDEF 8.0 (H) 08/10/2018 0858   O2SAT 54.5 08/13/2018 1022     Coagulation Profile: No results for input(s): INR, PROTIME in the last 168 hours.  Cardiac Enzymes: No results for input(s): CKTOTAL, CKMB, CKMBINDEX, TROPONINI in the last 168 hours.  HbA1C: Hgb A1c  MFr Bld  Date/Time Value Ref Range Status  08/06/2018 02:00 PM 10.2 (H) 4.8 - 5.6 % Final    Comment:    (NOTE) Pre diabetes:          5.7%-6.4% Diabetes:              >6.4% Glycemic control for   <7.0% adults with diabetes    CBG: Recent Labs  Lab 08/14/18 0002 08/14/18 0407 08/14/18 0816 08/14/18 1140 08/14/18 1556  GLUCAP 130* 95 175* 185* 182*   The patient is critically ill with multiple organ systems failure and requires high complexity decision making for assessment and support, frequent evaluation and titration of therapies, application of advanced monitoring technologies and extensive interpretation of multiple databases.   Critical Care Time devoted to patient care services described in this note is 36 Minutes. This time reflects time of care of this signee Dr. Rodman Pickle. This critical care time does not reflect procedure time, or teaching time or supervisory time of PA/NP/Med student/Med Resident etc but could involve care discussion time.  Rodman Pickle, M.D. [facility] Pulmonary/Critical Care Medicine Pager: [phone] After hours pager: [phone]

## 2018-08-14 NOTE — Progress Notes (Signed)
Physical Therapy Treatment Patient Details Name: [name] MRN: [id number] DOB: [birth year] Today's Date: 08/14/2018    History of Present Illness Pt is a 68 y.o. F with significant PMH of CAD, HTN, DM, tobacco abuse. Admitted 2/9 with NSTEMI, PEA on 2/10. Intubated 2/10-2/14.    PT Comments    Pt making steady progress. Fatigues quickly with activity. Cognition isn't 100% and significant other describes her as "foggy." Continue to follow to progress mobility and move toward return home with family.   Follow Up Recommendations  Home health PT;Supervision for mobility/OOB     Equipment Recommendations  Other (comment)(rollator)    Recommendations for Other Services       Precautions / Restrictions Precautions Precautions: Fall Restrictions Weight Bearing Restrictions: No    Mobility  Bed Mobility Overal bed mobility: Needs Assistance Bed Mobility: Supine to Sit     Supine to sit: Supervision     General bed mobility comments: Incr time to perform  Transfers Overall transfer level: Needs assistance Equipment used: 4-wheeled walker Transfers: Sit to/from Stand Sit to Stand: Min guard         General transfer comment: Assist for safety and lines  Ambulation/Gait Ambulation/Gait assistance: Min guard Gait Distance (Feet): 60 Feet(60' x 1, 50' x 1) Assistive device: 4-wheeled walker Gait Pattern/deviations: Step-through pattern;Decreased step length - right;Decreased step length - left;Trunk flexed Gait velocity: decr Gait velocity interpretation: 1.31 - 2.62 ft/sec, indicative of limited community ambulator General Gait Details: Assist for lines/safety. Pt fatigues quickly and required 1 sitting rest break on rollator. Amb on 2L with SpO2 >93% and dyspnea 3/4.    Stairs             Wheelchair Mobility    Modified Rankin (Stroke Patients Only)       Balance Overall balance assessment: Mild deficits observed, not formally tested                                           Cognition Arousal/Alertness: Awake/alert Behavior During Therapy: WFL for tasks assessed/performed Overall Cognitive Status: Impaired/Different from baseline Area of Impairment: Problem solving;Following commands;Memory                     Memory: Decreased short-term memory Following Commands: Follows multi-step commands with increased time     Problem Solving: Slow processing;Decreased initiation        Exercises      General Comments        Pertinent Vitals/Pain Pain Assessment: No/denies pain Faces Pain Scale: No hurt    Home Living                      Prior Function            PT Goals (current goals can now be found in the care plan section) Acute Rehab PT Goals PT Goal Formulation: With patient Time For Goal Achievement: 08/27/18 Potential to Achieve Goals: Good    Frequency    Min 3X/week      PT Plan Current plan remains appropriate    Co-evaluation              AM-PAC PT "6 Clicks" Mobility   Outcome Measure  Help needed turning from your back to your side while in a flat bed without using bedrails?: None Help needed moving from lying on your back  to sitting on the side of a flat bed without using bedrails?: A Little Help needed moving to and from a bed to a chair (including a wheelchair)?: A Little Help needed standing up from a chair using your arms (e.g., wheelchair or bedside chair)?: A Little Help needed to walk in hospital room?: A Little Help needed climbing 3-5 steps with a railing? : A Lot 6 Click Score: 18    End of Session Equipment Utilized During Treatment: Oxygen Activity Tolerance: Patient tolerated treatment well Patient left: in chair;with call bell/phone within reach Nurse Communication: Mobility status PT Visit Diagnosis: Difficulty in walking, not elsewhere classified (R26.2)     Time: 3734-2876 PT Time Calculation (min) (ACUTE ONLY): 24  min  Charges:  $Gait Training: 23-37 mins                     [place] Pager [phone] Office [place] 08/14/2018, 3:11 PM

## 2018-08-14 NOTE — Progress Notes (Addendum)
Patient ID: [name], female   DOB: Nov 18, 1950, 68 y.o.   MRN: [id number]     Advanced Heart Failure Rounding Note  PCP-Cardiologist: No primary care provider on file.   Subjective:    2/9 Admitted 2/9 with [place] and respiratory distress 2/10 PEA arrest 2/13 Underwent PCI of LAD 2/13 self-extubated  Milrinone stopped 08/12/18. Coox 54.5% yesterday. None today.  CVP 1-2.  Negative 1 L with IV lasix 40 mg BID yesterday. Cr 1.56. K 5.0  Feeling Ok this am. Up in chair. Denies chest pain.   CXR with patchy appearance mid to low lung zones representing edema +/- effusions.   RHC 08/10/18 LAD 80%---> DES LAD    RA 10  PA 60/28 (43) PCWP 29  CI 1.8   Echo 08/06/2018 - EF 25% , RV normal mild TR, and severe HK.   Objective:   Weight Range: 59.3 kg Body mass index is 23.91 kg/m.   Vital Signs:   Temp:  [97.3 F (36.3 C)-97.5 F (36.4 C)] 97.5 F (36.4 C) (02/17 0400) Pulse Rate:  [62-101] 93 (02/17 0500) Resp:  [11-33] 20 (02/17 0600) BP: (81-127)/(45-76) 108/62 (02/17 0600) SpO2:  [89 %-100 %] 98 % (02/17 0500) Weight:  [59.3 kg] 59.3 kg (02/17 0420) Last BM Date: 08/14/18  Weight change: Filed Weights   08/12/18 0500 08/13/18 0317 08/14/18 0420  Weight: 61.9 kg 60.1 kg 59.3 kg    Intake/Output:   Intake/Output Summary (Last 24 hours) at 08/14/2018 0759 Last data filed at 08/14/2018 0130 Gross per 24 hour  Intake -  Output 1102 ml  Net -1102 ml    Physical Exam    CVP 1-2, personally checked.   General: NAD HEENT: Normal Neck: Supple. JVP 5-6. Carotids 2+ bilat; no bruits. No thyromegaly or nodule noted. Cor: PMI nondisplaced. RRR, No M/G/R noted Lungs: Coarse throughout.  Abdomen: Soft, non-tender, non-distended, no HSM. No bruits or masses. +BS  Extremities: No cyanosis, clubbing, or rash. R and LLE no edema.  Neuro: Alert & orientedx3, cranial nerves grossly intact. moves all 4 extremities w/o difficulty. Affect pleasant   Telemetry   NSR 80-90s,  personally reviewed.   EKG    No new tracings.    Labs    CBC Recent Labs    08/13/18 0318 08/14/18 0411  WBC 10.8* 10.8*  NEUTROABS 7.7 7.7  HGB 11.7* 12.6  HCT 36.1 39.2  MCV 86.2 87.1  PLT 349 102   Basic Metabolic Panel Recent Labs    08/13/18 0318  08/13/18 2142 08/14/18 0411  NA  --    < > 138 137  K  --    < > 3.2* 5.0  CL  --    < > 89* 90*  CO2  --    < > 33* 34*  GLUCOSE  --    < > 99 101*  BUN  --    < > 25* 24*  CREATININE  --    < > 1.55* 1.56*  CALCIUM  --    < > 9.4 9.2  MG 1.9  --   --  1.9  PHOS 4.3  --   --  4.1   < > = values in this interval not displayed.   Liver Function Tests No results for input(s): AST, ALT, ALKPHOS, BILITOT, PROT, ALBUMIN in the last 72 hours. No results for input(s): LIPASE, AMYLASE in the last 72 hours. Cardiac Enzymes No results for input(s): CKTOTAL, CKMB, CKMBINDEX, TROPONINI in the last  72 hours.  BNP: BNP (last 3 results) Recent Labs    08/06/18 0628  BNP 1,535.2*    ProBNP (last 3 results) No results for input(s): PROBNP in the last 8760 hours.   D-Dimer No results for input(s): DDIMER in the last 72 hours. Hemoglobin A1C No results for input(s): HGBA1C in the last 72 hours. Fasting Lipid Panel No results for input(s): CHOL, HDL, LDLCALC, TRIG, CHOLHDL, LDLDIRECT in the last 72 hours. Thyroid Function Tests No results for input(s): TSH, T4TOTAL, T3FREE, THYROIDAB in the last 72 hours.  Invalid input(s): FREET3  Other results:   Imaging    Dg Chest Port 1 View  Result Date: 08/14/2018 CLINICAL DATA:  68 year old female with shortness breath and pulmonary edema. Subsequent encounter. EXAM: PORTABLE CHEST 1 VIEW COMPARISON:  08/13/2018 FINDINGS: Right jugular catheter tip projects at the expected level of the right subclavian vein, without change. Correlation with blood return recommended (this was introduced used for previously inserted swan Ganz catheter). Right PICC line tip proximal right  atrium level. Change in position from prior exam may be related to differences in angulation of imaging. Attention to this on follow-up. Alternatively, PICC line can be retracted by 2 cm. Cardiomegaly. Diffuse airspace disease with patchy appearance mid to lower lung zones bilaterally may represent pulmonary edema with pleural effusions. Cannot exclude lower lobe infectious process in the proper clinical setting. Overall appearance relatively similar to prior exam (when taking into account differences in technique). No pneumothorax detected. IMPRESSION: 1. Diffuse airspace disease with patchy appearance mid to lower lung zones bilaterally may represent pulmonary edema with pleural effusions. Cannot exclude lower lobe infectious process in the proper clinical setting. Overall appearance relatively similar to prior exam (when taking into account differences in technique). 2. Cardiomegaly. 3. Right PICC line tip proximal right atrium level. Apparent change in position from prior exam may be related to differences in angulation of imaging. Attention to this on follow-up. Alternatively, PICC line can be retracted by 2 cm. 4. Right jugular catheter tip projects at the expected level of the right subclavian vein, without change. Electronically Signed   By: Genia Del M.D.   On: 08/14/2018 07:41   Dg Swallowing Func-speech Pathology  Result Date: 08/13/2018 Objective Swallowing Evaluation: Type of Study: MBS-Modified Barium Swallow Study  Patient Details Name: [name] MRN: [id number] Date of Birth: [birth year] Today's Date: 08/13/2018 Time: SLP Start Time (ACUTE ONLY): 1450 -SLP Stop Time (ACUTE ONLY): 1514 SLP Time Calculation (min) (ACUTE ONLY): 24 min Past Medical History: Past Medical History: Diagnosis Date . Diabetes mellitus without complication ([place])  . Hypertension  Past Surgical History: Past Surgical History: Procedure Laterality Date . CORONARY STENT INTERVENTION N/A 08/10/2018  Procedure: CORONARY STENT  INTERVENTION;  Surgeon: Leonie Man, MD;  Location: [facility];  Service: Cardiovascular;  Laterality: N/A; . RIGHT/LEFT HEART CATH AND CORONARY ANGIOGRAPHY N/A 08/10/2018  Procedure: RIGHT/LEFT HEART CATH AND CORONARY ANGIOGRAPHY;  Surgeon: Leonie Man, MD;  Location: [facility];  Service: Cardiovascular;  Laterality: N/A; HPI: Pt is a 68 yo female admitted 2/9 with NSTEMI. PEA arrest on 2/10. Pt was intubated on 2/10, self-extubated 2/14. Cardiac cath 2/13. PMH: CAD, HTN, DM, tobacco abuse.  CXR 2/14 concerning for bibasilar atelectasis or infiltrates.  Subjective: The patient was seen in radiology for MBS. Assessment / Plan / Recommendation CHL IP CLINICAL IMPRESSIONS 08/13/2018 Clinical Impression MBS was completed using thin liquids via spoon, nectar thick liquids via spoon and cup, honey thick  liquids via cup and soft solids .  The patient presented with a mild oral and moderate pharyngeal dysphagia.    The oral phase was marked by good lingual control but delayed a/p transport of the bolus particularly given solids.  The pharyngeal phase was marked by decreased hyolaryngeal movement, intermittently decreased epiglottic movement, decreased base of tongue retraction and decreased laryngeal vestibule closure.  These deficits led to vallecula residue.  In addition, aspiration prior to the swallow with a cough response that was ineffective to clear material from the airway was seen given thin liquids via spoon sips.  Aspiration into the airway with clearance of most of the material at completion of the swallow was seen prior to the swallow given nectar thick liquids.   Trace intermittent penetration into the laryngeal vestibule was seen given pureed material and soft solids.   Recommend downgrading diet to dysphagia 3 (mechanical soft) with honey thick liquids.  Oral care should be completed prior o PO intake to mitigate aspiration risk.  ST will follow for therapeutic diet tolerance and to  initiate swallowng therapy.   SLP Visit Diagnosis Dysphagia, oropharyngeal phase (R13.12) Attention and concentration deficit following -- Frontal lobe and executive function deficit following -- Impact on safety and function Moderate aspiration risk   CHL IP TREATMENT RECOMMENDATION 08/13/2018 Treatment Recommendations Therapy as outlined in treatment plan below   Prognosis 08/13/2018 Prognosis for Safe Diet Advancement Good Barriers to Reach Goals -- Barriers/Prognosis Comment -- CHL IP DIET RECOMMENDATION 08/13/2018 SLP Diet Recommendations Honey thick liquids;Dysphagia 3 (Mech soft) solids Liquid Administration via Cup;No straw Medication Administration Crushed with puree Compensations Slow rate;Small sips/bites Postural Changes Seated upright at 90 degrees   CHL IP OTHER RECOMMENDATIONS 08/13/2018 Recommended Consults -- Oral Care Recommendations Oral care BID;Oral care before and after PO Other Recommendations Have oral suction available   CHL IP FOLLOW UP RECOMMENDATIONS 08/13/2018 Follow up Recommendations Inpatient Rehab;Skilled Nursing facility;Home health SLP   CHL IP FREQUENCY AND DURATION 08/13/2018 Speech Therapy Frequency (ACUTE ONLY) min 2x/week Treatment Duration 2 weeks      CHL IP ORAL PHASE 08/13/2018 Oral Phase WFL Oral - Pudding Teaspoon -- Oral - Pudding Cup -- Oral - Honey Teaspoon -- Oral - Honey Cup -- Oral - Nectar Teaspoon -- Oral - Nectar Cup -- Oral - Nectar Straw -- Oral - Thin Teaspoon -- Oral - Thin Cup -- Oral - Thin Straw -- Oral - Puree -- Oral - Mech Soft -- Oral - Regular -- Oral - Multi-Consistency -- Oral - Pill -- Oral Phase - Comment --  CHL IP PHARYNGEAL PHASE 08/13/2018 Pharyngeal Phase Impaired Pharyngeal- Pudding Teaspoon -- Pharyngeal -- Pharyngeal- Pudding Cup -- Pharyngeal -- Pharyngeal- Honey Teaspoon -- Pharyngeal -- Pharyngeal- Honey Cup Pharyngeal residue - valleculae;Reduced tongue base retraction;Reduced laryngeal elevation Pharyngeal -- Pharyngeal- Nectar Teaspoon  Reduced laryngeal elevation;Reduced tongue base retraction;Reduced airway/laryngeal closure;Penetration/Aspiration before swallow;Pharyngeal residue - valleculae Pharyngeal Material enters airway, passes BELOW cords then ejected out Pharyngeal- Nectar Cup Reduced laryngeal elevation;Reduced airway/laryngeal closure;Reduced tongue base retraction;Penetration/Aspiration before swallow;Pharyngeal residue - valleculae Pharyngeal Material enters airway, passes BELOW cords then ejected out Pharyngeal- Nectar Straw -- Pharyngeal -- Pharyngeal- Thin Teaspoon Penetration/Aspiration before swallow;Reduced epiglottic inversion;Reduced airway/laryngeal closure;Reduced anterior laryngeal mobility;Reduced laryngeal elevation Pharyngeal Material enters airway, passes BELOW cords and not ejected out despite cough attempt by patient Pharyngeal- Thin Cup -- Pharyngeal -- Pharyngeal- Thin Straw -- Pharyngeal -- Pharyngeal- Puree Penetration/Aspiration during swallow;Reduced tongue base retraction;Pharyngeal residue - valleculae Pharyngeal Material enters airway, remains ABOVE  vocal cords and not ejected out Pharyngeal- Mechanical Soft -- Pharyngeal -- Pharyngeal- Regular Reduced laryngeal elevation;Penetration/Aspiration during swallow;Reduced tongue base retraction;Pharyngeal residue - valleculae Pharyngeal Material enters airway, remains ABOVE vocal cords and not ejected out Pharyngeal- Multi-consistency -- Pharyngeal -- Pharyngeal- Pill -- Pharyngeal -- Pharyngeal Comment --  CHL IP CERVICAL ESOPHAGEAL PHASE 08/13/2018 Cervical Esophageal Phase WFL Pudding Teaspoon -- Pudding Cup -- Honey Teaspoon -- Honey Cup -- Nectar Teaspoon -- Nectar Cup -- Nectar Straw -- Thin Teaspoon -- Thin Cup -- Thin Straw -- Puree -- Mechanical Soft -- Regular -- Multi-consistency -- Pill -- Cervical Esophageal Comment -- Shelly Flatten, MA, CCC-SLP Acute Rehab SLP [phone] Lamar Sprinkles 08/13/2018, 3:35 PM                Medications:      Scheduled Medications: . aspirin  81 mg Per Tube Daily  . atorvastatin  40 mg Per Tube q1800  . Chlorhexidine Gluconate Cloth  6 each Topical Daily  . digoxin  0.125 mg Oral Daily  . furosemide  40 mg Intravenous BID  . heparin injection (subcutaneous)  5,000 Units Subcutaneous Q8H  . insulin aspart  0-20 Units Subcutaneous Q4H  . losartan  12.5 mg Oral QHS  . mouth rinse  15 mL Mouth Rinse BID  . multivitamin with minerals  1 tablet Oral Daily  . pantoprazole  40 mg Oral Daily  . senna  2 tablet Per Tube Daily  . sodium chloride flush  10-40 mL Intracatheter Q12H  . spironolactone  25 mg Oral Daily  . ticagrelor  90 mg Oral BID    Infusions:   PRN Medications: acetaminophen **OR** acetaminophen, albuterol, ondansetron **OR** ondansetron (ZOFRAN) IV, RESOURCE THICKENUP CLEAR, sodium chloride flush, sodium chloride flush  Patient Profile   Ms Kilbride is a 68 year old with a history of DMII, HTN, and smoker admitted with increased shortness of breath and chest pain. Troponin 7.5>6.5>6.3 and BNP 1535. CXR was concerning for pulmonary edema so IV lasix was given. Taken to the cath lab on 2/10 but this was aborted due to PEA arrest.  Assessment/Plan   1. Acute Hypoxic Respiratory Failure--> PEA arrest  - Intubated on 2/10 with CCM managing. Self extubated overnight. Now on 4L [place].    - CXR this am still with patchy infiltrates and mild pulmonary edema - Remains on O2 via [place].  CVP still not markedly high but up to 9-10 today.  ? Component of ARDS or more likely aspiration with PEA arrest (and baseline COPD).  No fever and WBCs 10.8. CCM primary.  She is not on antibiotics.     2. PEA arrest on 2/10  - Due to hypoxia in setting of severe COPD and CHF - No further.   3. Acute systolic HR -> Cardiogenic Shock in the setting of NSTEMI/PEA arrest  - Suspect mixed ICM. NICM. S/p PCI  80% LAD on 12/15. ECHO 2/9 EF 20-25%  - Initial hemodynamics c/w shock. Now improving and off  milrinone.  She remains on digoxin.  Check co-ox today off milrinone. Dig level 0.6.  - Continue current spironolactone and losartan, no BP room to increase and no BMET yet today (send stat). - CVP 1-2 today. Hold lasix.  - Follow K 3.2 -> 5.0. No supp ordered for today. (Had 2 IV runs at midnight)  4. Pulmonary Hypertension  - Mostly pulmonary venous HTN.  PVR 4.7 No formal diagnosis of COPD but has long history of smoking.  -  Now off milrinone.   4. NSTEMI  - peak troponin 7.5 - 08/10/18 LHC with DES to 80% mLAD - Now passed swallow and on ticagrelor + ASA. No change.   5. DMII - SS per primary.  - Hgb A 1 C 10.2   Length of Stay: 8  Shirley Friar, [place]  7:59 AM Advanced Heart Failure Team Pager [phone] (M-F; 7a - 4p)  Please contact [facility] for night-coverage after hours (4p -7a ) and weekends on [email or web address]  Patient seen and examined with the above-signed Advanced Practice Provider and/or Housestaff. I personally reviewed laboratory data, imaging studies and relevant notes. I independently examined the patient and formulated the important aspects of the plan. I have edited the note to reflect any of my changes or salient points. I have personally discussed the plan with the patient and/or family.  She is improving slowly. Up in chair. Feels weak but denies CP or SOB. CXR remains with bilateral infiltrates but CVP only 1-2. ? Aspiration but no infectious symptoms. No procalcitonin on chart.  Off milrinone. Luiz Blare out. Remains on spiro, dig and losartan. No b-blocker yet.   Can got to SDU. Consider chest CT (will await Pulmonary input. CXR may just be lagging).   Glori Bickers, MD  8:47 AM

## 2018-08-15 ENCOUNTER — Inpatient Hospital Stay (HOSPITAL_COMMUNITY): Payer: Medicare HMO

## 2018-08-15 LAB — CBC WITH DIFFERENTIAL/PLATELET
Abs Immature Granulocytes: 0.09 10*3/uL — ABNORMAL HIGH (ref 0.00–0.07)
Basophils Absolute: 0 10*3/uL (ref 0.0–0.1)
Basophils Relative: 0 %
EOS PCT: 2 %
Eosinophils Absolute: 0.2 10*3/uL (ref 0.0–0.5)
HCT: 37.8 % (ref 36.0–46.0)
Hemoglobin: 12.1 g/dL (ref 12.0–15.0)
Immature Granulocytes: 1 %
LYMPHS PCT: 17 %
Lymphs Abs: 2 10*3/uL (ref 0.7–4.0)
MCH: 27.9 pg (ref 26.0–34.0)
MCHC: 32 g/dL (ref 30.0–36.0)
MCV: 87.3 fL (ref 80.0–100.0)
Monocytes Absolute: 1.3 10*3/uL — ABNORMAL HIGH (ref 0.1–1.0)
Monocytes Relative: 11 %
Neutro Abs: 8.2 10*3/uL — ABNORMAL HIGH (ref 1.7–7.7)
Neutrophils Relative %: 69 %
Platelets: 340 10*3/uL (ref 150–400)
RBC: 4.33 MIL/uL (ref 3.87–5.11)
RDW: 14.1 % (ref 11.5–15.5)
WBC: 11.7 10*3/uL — ABNORMAL HIGH (ref 4.0–10.5)
nRBC: 0 % (ref 0.0–0.2)

## 2018-08-15 LAB — GLUCOSE, CAPILLARY
Glucose-Capillary: 117 mg/dL — ABNORMAL HIGH (ref 70–99)
Glucose-Capillary: 127 mg/dL — ABNORMAL HIGH (ref 70–99)
Glucose-Capillary: 153 mg/dL — ABNORMAL HIGH (ref 70–99)
Glucose-Capillary: 175 mg/dL — ABNORMAL HIGH (ref 70–99)
Glucose-Capillary: 168 mg/dL — ABNORMAL HIGH (ref 70–99)
Glucose-Capillary: 135 mg/dL — ABNORMAL HIGH (ref 70–99)

## 2018-08-15 LAB — BASIC METABOLIC PANEL
Anion gap: 16 — ABNORMAL HIGH (ref 5–15)
BUN: 23 mg/dL (ref 8–23)
CO2: 30 mmol/L (ref 22–32)
CREATININE: 1.62 mg/dL — AB (ref 0.44–1.00)
Calcium: 8.6 mg/dL — ABNORMAL LOW (ref 8.9–10.3)
Chloride: 94 mmol/L — ABNORMAL LOW (ref 98–111)
GFR calc non Af Amer: 32 mL/min — ABNORMAL LOW (ref >60)
GFR, EST AFRICAN AMERICAN: 37 mL/min — AB (ref >60)
Glucose, Bld: 118 mg/dL — ABNORMAL HIGH (ref 70–99)
Potassium: 2.9 mmol/L — ABNORMAL LOW (ref 3.5–5.1)
Sodium: 140 mmol/L (ref 135–145)

## 2018-08-15 LAB — COOXEMETRY PANEL
Carboxyhemoglobin: 1 % (ref 0.5–1.5)
Methemoglobin: 1.4 % (ref 0.0–1.5)
O2 Saturation: 63.9 %
Total hemoglobin: 13.7 g/dL (ref 12.0–16.0)

## 2018-08-15 LAB — PHOSPHORUS: Phosphorus: 4.8 mg/dL — ABNORMAL HIGH (ref 2.5–4.6)

## 2018-08-15 LAB — MAGNESIUM: Magnesium: 1.8 mg/dL (ref 1.7–2.4)

## 2018-08-15 MED ORDER — SENNA 8.6 MG PO TABS
2.0000 | ORAL_TABLET | Freq: Every day | ORAL | Status: DC
  Administered 2018-08-16 – 2018-08-17 (×2): 17.2 mg via ORAL
  Filled 2018-08-15 (×2): qty 2

## 2018-08-15 MED ORDER — ATORVASTATIN CALCIUM 40 MG PO TABS
40.0000 mg | ORAL_TABLET | Freq: Every day | ORAL | Status: DC
  Administered 2018-08-15 – 2018-08-16 (×2): 40 mg via ORAL
  Filled 2018-08-15 (×2): qty 1

## 2018-08-15 MED ORDER — ASPIRIN 81 MG PO CHEW
81.0000 mg | CHEWABLE_TABLET | Freq: Every day | ORAL | Status: DC
  Administered 2018-08-16 – 2018-08-17 (×2): 81 mg via ORAL
  Filled 2018-08-15 (×2): qty 1

## 2018-08-15 MED ORDER — INSULIN ASPART 100 UNIT/ML ~~LOC~~ SOLN
0.0000 [IU] | Freq: Three times a day (TID) | SUBCUTANEOUS | Status: DC
  Administered 2018-08-15 – 2018-08-16 (×4): 4 [IU] via SUBCUTANEOUS
  Administered 2018-08-17: 3 [IU] via SUBCUTANEOUS
  Administered 2018-08-17: 4 [IU] via SUBCUTANEOUS

## 2018-08-15 MED ORDER — MAGNESIUM SULFATE 2 GM/50ML IV SOLN
2.0000 g | Freq: Once | INTRAVENOUS | Status: AC
  Administered 2018-08-15: 2 g via INTRAVENOUS
  Filled 2018-08-15: qty 50

## 2018-08-15 MED ORDER — ACETAMINOPHEN 325 MG PO TABS: 650.0000 mg | ORAL_TABLET | Freq: Four times a day (QID) | ORAL | Status: DC | PRN

## 2018-08-15 MED ORDER — POTASSIUM CHLORIDE 10 MEQ/50ML IV SOLN
10.0000 meq | INTRAVENOUS | Status: AC
  Administered 2018-08-15 (×6): 10 meq via INTRAVENOUS
  Filled 2018-08-15 (×6): qty 50

## 2018-08-15 MED ORDER — POTASSIUM CHLORIDE CRYS ER 20 MEQ PO TBCR
20.0000 meq | EXTENDED_RELEASE_TABLET | Freq: Once | ORAL | Status: AC
  Administered 2018-08-15: 20 meq via ORAL
  Filled 2018-08-15: qty 1

## 2018-08-15 MED ORDER — INSULIN ASPART 100 UNIT/ML ~~LOC~~ SOLN: 0.0000 [IU] | Freq: Every day | SUBCUTANEOUS | Status: DC

## 2018-08-15 NOTE — Progress Notes (Signed)
[name] - Stepdown/ICU TEAM  Sheresa Cullop  HKV:[id number] DOB: 1950/11/18 DOA: 08/06/2018 PCP: [name], MD    Brief Narrative:  [phone] w/ a hx of CAD, lifelong smoker, and DM who was admitted 08/06/2018 with a non-ST elevation MI. She was scheduled for left heart catheterization 08/07/2018. While being placed on the table she had a PEA arrest that lasted for 4 minutes of CPR, including Wilson round of epinephrine. He had a return of spontaneous circulation and was awake but was not mentating. She had a profound acidosis and was transferred to the heart unit where she was urgently intubated and placed on full mechanical ventilatory support.  Significant Events: 2/09 admit w/ [place] and dyspnea  2/10 PEA arrest - CPR   2/13 cardiac cath > LAD stent  2/14 self-extubated 2/15 milrinone stopped  2/18 TRH assumed care   Subjective: The patient is sitting up in a bedside chair.  She denies shortness of breath at rest.  She denies chest pain nausea vomiting or abdominal pain.  She does report that she feels "tired in general."  She is alert and oriented.  Assessment & Plan:  Acute Hypoxic respiratory failure secondary to pulmonary edema in setting of cardiogenic shock s/p self-extubation 2/16 - appears to be stabilizing   S/p PEA arrest secondary to hypoxemia - Cardiogenic shock 6 minutes PEA arrest requiring 4 minutes of CPR and Wilson amp of epinephrine on 08/07/2018 - TTE 2/9 noted EF 20-25% - suspect mixed ICM/NICM - care per CHF Team   CAD PCI of LAD on 2/13 - ASA and ticagrelor for cardiac stent  Pulmonary Venous HTN Per CHF Team  Severe COPD Well compensated at this time   Hypokalemia Supplement to goal of 4.0  Hypomagnesemia Supplement to goal of 2.0  Hypophosphatemia Corrected  AKI crt slowly climbing - follow trend   Diabetes Mellitus A1c 10.2 - CBG variable - follow w/o change today   DVT prophylaxis: SQ heparin Code Status: FULL CODE Family Communication: no family  present at time of exam  Disposition Plan: SDU  Consultants:  Cards PCCM  Antimicrobials:  none   Objective: Blood pressure 99/61, pulse 64, temperature 97.6 F (36.4 C), temperature source Oral, resp. rate (!) 25, height 5\' 2"  (Wilson.575 m), weight 58.6 kg, SpO2 96 %.  Intake/Output Summary (Last 24 hours) at 08/15/2018 1404 Last data filed at 08/15/2018 1100 Gross per 24 hour  Intake 139.19 ml  Output -  Net 139.19 ml   Filed Weights   08/13/18 0317 08/14/18 0420 08/15/18 0452  Weight: 60.Wilson kg 59.3 kg 58.6 kg    Examination: General: No acute respiratory distress at rest in bed  Lungs: Clear to auscultation bilaterally - mild basilar crackles  Cardiovascular: Regular rate and rhythm without murmur Abdomen: NT/ND, soft, bowel sounds positive, no rebound, no ascites, no appreciable mass Extremities: trace B LE edema B LE   CBC: Recent Labs  Lab 08/13/18 0318 08/14/18 0411 08/15/18 0401  WBC 10.8* 10.8* 11.7*  NEUTROABS 7.7 7.7 8.2*  HGB 11.7* 12.6 12.Wilson  HCT 36.Wilson 39.2 37.8  MCV 86.2 87.Wilson 87.3  PLT 349 348 329   Basic Metabolic Panel: Recent Labs  Lab 08/13/18 0318  08/13/18 2142 08/14/18 0411 08/15/18 0401  NA  --    < > 138 137 140  K  --    < > 3.2* 5.0 2.9*  CL  --    < > 89* 90* 94*  CO2  --    < >  33* 34* 30  GLUCOSE  --    < > 99 101* 118*  BUN  --    < > 25* 24* 23  CREATININE  --    < > Wilson.55* Wilson.56* Wilson.62*  CALCIUM  --    < > 9.4 9.2 8.6*  MG Wilson.9  --   --  Wilson.9 Wilson.8  PHOS 4.3  --   --  4.Wilson 4.8*   < > = values in this interval not displayed.   GFR: Estimated Creatinine Clearance: 25.9 mL/min (A) (by C-G formula based on SCr of Wilson.62 mg/dL (H)).  Liver Function Tests: No results for input(s): AST, ALT, ALKPHOS, BILITOT, PROT, ALBUMIN in the last 168 hours. No results for input(s): LIPASE, AMYLASE in the last 168 hours. No results for input(s): AMMONIA in the last 168 hours.   HbA1C: Hgb A1c MFr Bld  Date/Time Value Ref Range Status  08/06/2018  02:00 PM 10.2 (H) 4.8 - 5.6 % Final    Comment:    (NOTE) Pre diabetes:          5.7%-6.4% Diabetes:              >6.4% Glycemic control for   <7.0% adults with diabetes     CBG: Recent Labs  Lab 08/14/18 2009 08/14/18 2358 08/15/18 0407 08/15/18 0822 08/15/18 1152  GLUCAP 99 127* 117* 153* 175*    Recent Results (from the past 240 hour(s))  Culture, blood (routine x 2)     Status: None   Collection Time: 08/06/18  Wilson:50 PM  Result Value Ref Range Status   Specimen Description BLOOD LEFT HAND  Final   Special Requests   Final    BOTTLES DRAWN AEROBIC ONLY Blood Culture results may not be optimal due to an inadequate volume of blood received in culture bottles   Culture   Final    NO GROWTH 5 DAYS Performed at [facility], [place] [address], [place], [place] [address]    Report Status 08/11/2018 FINAL  Final  Culture, blood (routine x 2)     Status: None   Collection Time: 08/06/18  2:00 PM  Result Value Ref Range Status   Specimen Description BLOOD RIGHT HAND  Final   Special Requests   Final    BOTTLES DRAWN AEROBIC ONLY Blood Culture results may not be optimal due to an inadequate volume of blood received in culture bottles   Culture   Final    NO GROWTH 5 DAYS Performed at [facility], [place] [address], [place], [place] [address]    Report Status 08/11/2018 FINAL  Final  MRSA PCR Screening     Status: None   Collection Time: 08/07/18  9:47 AM  Result Value Ref Range Status   MRSA by PCR NEGATIVE NEGATIVE Final    Comment:        The GeneXpert MRSA Assay (FDA approved for NASAL specimens only), is one component of a comprehensive MRSA colonization surveillance program. It is not intended to diagnose MRSA infection nor to guide or monitor treatment for MRSA infections. Performed at [facility], [place] [address] [place], [place] [address]   Respiratory Panel by PCR     Status: None   Collection Time: 08/07/18 11:12 AM  Result Value Ref Range  Status   Adenovirus NOT DETECTED NOT DETECTED Final   Coronavirus 229E NOT DETECTED NOT DETECTED Final    Comment: (NOTE) The Coronavirus on the Respiratory Panel, DOES  NOT test for the novel  Coronavirus (2019 nCoV)    Coronavirus HKU1 NOT DETECTED NOT DETECTED Final   Coronavirus NL63 NOT DETECTED NOT DETECTED Final   Coronavirus OC43 NOT DETECTED NOT DETECTED Final   Metapneumovirus NOT DETECTED NOT DETECTED Final   Rhinovirus / Enterovirus NOT DETECTED NOT DETECTED Final   Influenza A NOT DETECTED NOT DETECTED Final   Influenza B NOT DETECTED NOT DETECTED Final   Parainfluenza Virus Wilson NOT DETECTED NOT DETECTED Final   Parainfluenza Virus 2 NOT DETECTED NOT DETECTED Final   Parainfluenza Virus 3 NOT DETECTED NOT DETECTED Final   Parainfluenza Virus 4 NOT DETECTED NOT DETECTED Final   Respiratory Syncytial Virus NOT DETECTED NOT DETECTED Final   Bordetella pertussis NOT DETECTED NOT DETECTED Final   Chlamydophila pneumoniae NOT DETECTED NOT DETECTED Final   Mycoplasma pneumoniae NOT DETECTED NOT DETECTED Final    Comment: Performed at [facility], [place] [address] [place], [place] [address]  Culture, respiratory (non-expectorated)     Status: None   Collection Time: 08/07/18 11:30 AM  Result Value Ref Range Status   Specimen Description TRACHEAL ASPIRATE  Final   Special Requests NONE  Final   Gram Stain   Final    ABUNDANT WBC PRESENT,BOTH PMN AND MONONUCLEAR NO ORGANISMS SEEN    Culture   Final    RARE Consistent with normal respiratory flora. Performed at [facility], [place] [address] [place], [place] [address]    Report Status 08/09/2018 FINAL  Final     Scheduled Meds: . aspirin  81 mg Per Tube Daily  . atorvastatin  40 mg Per Tube q1800  . Chlorhexidine Gluconate Cloth  6 each Topical Daily  . digoxin  0.125 mg Oral Daily  . heparin injection (subcutaneous)  5,000 Units Subcutaneous Q8H  . insulin aspart  0-20 Units Subcutaneous Q4H  . losartan  12.5  mg Oral QHS  . mouth rinse  15 mL Mouth Rinse BID  . multivitamin with minerals  Wilson tablet Oral Daily  . pantoprazole  40 mg Oral Daily  . senna  2 tablet Per Tube Daily  . sodium chloride flush  10-40 mL Intracatheter Q12H  . spironolactone  25 mg Oral Daily  . ticagrelor  90 mg Oral BID     LOS: 9 days   Cherene Altes, MD [facility] Office  [phone] Pager - Text Page per Amion  If 7PM-7AM, please contact night-coverage per Amion 08/15/2018, 2:04 PM

## 2018-08-15 NOTE — Progress Notes (Signed)
CARDIAC REHAB PHASE I   PRE:  Rate/Rhythm: 95 SR  BP:  Supine:   Sitting: 110/57  Standing:    SaO2: 94% 1L  MODE:  Ambulation: 70 ft   POST:  Rate/Rhythm: down to 45 ?JR when sitting to rest  High of 104 ST  BP:  Supine:   Sitting: 110/57, 100/54  Standing:    SaO2: 91% 1L to 98% 2L 1040-1112 Pt stated not as foggy today. Sat pt on side of bed to prepare for walk. Pt stood and had to immediately sit back down. BP taken with little change. Pt walked 70 ft on 1L with gait belt use, rolling walker and asst x 2. Pt encouraged to stop and rest. Pt wanted to go farther. Pt sat after walking about 70 ft. C/o feeling tired and a little SOB. HR observed to drop to 45 and JR. Pt denied feeling worse. HR would fluctuate a low of 45 to 104. Put pt in recliner and rolled back to room. RN assisted with rolling pt to room. RN aware of drop in HR. ? Vagal response.  Increased to 2L when HR low. No c/o CP, just tired.   Graylon Good, RN BSN  08/15/2018 11:06 AM

## 2018-08-15 NOTE — Progress Notes (Addendum)
Patient ID: [name], female   DOB: 26-Aug-1950, 68 y.o.   MRN: [id number]     Advanced Heart Failure Rounding Note  PCP-Cardiologist: No primary care provider on file.   Subjective:    2/9 Admitted 2/9 with [place] and respiratory distress 2/10 PEA arrest 2/13 Underwent PCI of LAD 2/13 self-extubated  Milrinone stopped 08/12/18. No further coox since 2/16. CVP 2-3 this am. Negative 250. Lasix held. Cr 1.62. K 2.9. Coox 64%  Feeling OK this am. Appetite low.  CXR viewed personally and is improving.   CXR 08/14/18 with patchy appearance mid to low lung zones representing edema +/- effusions.   RHC 08/10/18 LAD 80%---> DES LAD    RA 10  PA 60/28 (43) PCWP 29  CI 1.8   Echo 08/06/2018 - EF 25% , RV normal mild TR, and severe HK.   Objective:   Weight Range: 58.6 kg Body mass index is 23.63 kg/m.   Vital Signs:   Temp:  [97.4 F (36.3 C)-97.5 F (36.4 C)] 97.4 F (36.3 C) (02/18 0400) Pulse Rate:  [27-93] 66 (02/18 0700) Resp:  [13-25] 20 (02/18 0700) BP: (82-110)/(45-77) 110/66 (02/18 0700) SpO2:  [95 %-100 %] 97 % (02/18 0700) Weight:  [58.6 kg] 58.6 kg (02/18 0452) Last BM Date: 08/14/18  Weight change: Filed Weights   08/13/18 0317 08/14/18 0420 08/15/18 0452  Weight: 60.1 kg 59.3 kg 58.6 kg    Intake/Output:   Intake/Output Summary (Last 24 hours) at 08/15/2018 0809 Last data filed at 08/14/2018 1241 Gross per 24 hour  Intake -  Output 250 ml  Net -250 ml    Physical Exam    CVP 2-3 cm  General: Fatigued appearing. No resp difficulty. HEENT: Normal Neck: Supple. JVP 5-6. Carotids 2+ bilat; no bruits. No thyromegaly or nodule noted. Cor: PMI nondisplaced. RRR, No M/G/R noted Lungs: crackles at bases. Decreased BS throughout. normal effort. Abdomen: Soft, non-tender, non-distended, no HSM. No bruits or masses. +BS  Extremities: No cyanosis, clubbing, or rash. R and LLE no edema.  Neuro: Alert & orientedx3, cranial nerves grossly intact. moves all 4  extremities w/o difficulty. Affect pleasant   Telemetry   NSR 80-90s with PVCs, personally reviewed.    EKG    No new tracings.    Labs    CBC Recent Labs    08/14/18 0411 08/15/18 0401  WBC 10.8* 11.7*  NEUTROABS 7.7 8.2*  HGB 12.6 12.1  HCT 39.2 37.8  MCV 87.1 87.3  PLT 348 440   Basic Metabolic Panel Recent Labs    08/14/18 0411 08/15/18 0401  NA 137 140  K 5.0 2.9*  CL 90* 94*  CO2 34* 30  GLUCOSE 101* 118*  BUN 24* 23  CREATININE 1.56* 1.62*  CALCIUM 9.2 8.6*  MG 1.9 1.8  PHOS 4.1 4.8*   Liver Function Tests No results for input(s): AST, ALT, ALKPHOS, BILITOT, PROT, ALBUMIN in the last 72 hours. No results for input(s): LIPASE, AMYLASE in the last 72 hours. Cardiac Enzymes No results for input(s): CKTOTAL, CKMB, CKMBINDEX, TROPONINI in the last 72 hours.  BNP: BNP (last 3 results) Recent Labs    08/06/18 0628  BNP 1,535.2*    ProBNP (last 3 results) No results for input(s): PROBNP in the last 8760 hours.   D-Dimer No results for input(s): DDIMER in the last 72 hours. Hemoglobin A1C No results for input(s): HGBA1C in the last 72 hours. Fasting Lipid Panel No results for input(s): CHOL, HDL, LDLCALC, TRIG, CHOLHDL,  LDLDIRECT in the last 72 hours. Thyroid Function Tests No results for input(s): TSH, T4TOTAL, T3FREE, THYROIDAB in the last 72 hours.  Invalid input(s): FREET3  Other results:   Imaging    No results found.   Medications:     Scheduled Medications: . aspirin  81 mg Per Tube Daily  . atorvastatin  40 mg Per Tube q1800  . Chlorhexidine Gluconate Cloth  6 each Topical Daily  . digoxin  0.125 mg Oral Daily  . heparin injection (subcutaneous)  5,000 Units Subcutaneous Q8H  . insulin aspart  0-20 Units Subcutaneous Q4H  . losartan  12.5 mg Oral QHS  . mouth rinse  15 mL Mouth Rinse BID  . multivitamin with minerals  1 tablet Oral Daily  . pantoprazole  40 mg Oral Daily  . senna  2 tablet Per Tube Daily  . sodium  chloride flush  10-40 mL Intracatheter Q12H  . spironolactone  25 mg Oral Daily  . ticagrelor  90 mg Oral BID    Infusions: . potassium chloride 10 mEq (08/15/18 0634)    PRN Medications: acetaminophen **OR** acetaminophen, albuterol, ondansetron **OR** ondansetron (ZOFRAN) IV, RESOURCE THICKENUP CLEAR, sodium chloride flush, sodium chloride flush  Patient Profile   [name] is a 68 year old with a history of DMII, HTN, and smoker admitted with increased shortness of breath and chest pain. Troponin 7.5>6.5>6.3 and BNP 1535. CXR was concerning for pulmonary edema so IV lasix was given. Taken to the cath lab on 2/10 but this was aborted due to PEA arrest.  Assessment/Plan   1. Acute Hypoxic Respiratory Failure--> PEA arrest  - Intubated on 2/10 with CCM managing. Self extubated overnight. Now on 4L [place].    - CXR 2/17 with patchy infiltrates and mild pulmonary edema - Remains on O2 via [place].  CVP 2-3. Lasix held yesterday. None today. ? Component of ARDS or more likely aspiration with PEA arrest (and baseline COPD).  No fever and WBCs 11.7. CCM primary.  She is not on antibiotics.     2. PEA arrest on 2/10  - Due to hypoxia in setting of severe COPD and CHF - No further.   3. Acute systolic HR -> Cardiogenic Shock in the setting of NSTEMI/PEA arrest  - Suspect mixed ICM. NICM. S/p PCI  80% LAD on 12/15. ECHO 2/9 EF 20-25%  - Initial hemodynamics c/w shock. Now improving and off milrinone.  She remains on digoxin. Dig level 0.6.  - Continue spironolactone 25 mg daily - Continue losartan 12.5 mg qhs. BP dips into 80-90s overnight, but she may be able to tolerate increase.  - CVP 2-3. Holding lasix.  - Follow K 3.2 -> 5.0 -> 2.9. Supp ordered.  4. Pulmonary Hypertension  - Mostly pulmonary venous HTN.  PVR 4.7 No formal diagnosis of COPD but has long history of smoking.  - Now off milrinone.    4. NSTEMI  - peak troponin 7.5 - 08/10/18 LHC with DES to 80% mLAD - Now passed swallow  and on ticagrelor + ASA. No change.   5. DMII - SS per primary.  - Hgb A 1 C 10.2   6. Hypokalemia - K 2.9. will supp and follow.   OK for stepdown. Will repeat CXR.  Length of Stay: [address] [place], [place]  8:09 AM Advanced Heart Failure Team Pager [phone] (M-F; 7a - 4p)  Please contact [facility] for night-coverage after hours (4p -7a ) and weekends on [email or web address]  Patient  seen and examined with the above-signed Advanced Practice Provider and/or Housestaff. I personally reviewed laboratory data, imaging studies and relevant notes. I independently examined the patient and formulated the important aspects of the plan. I have edited the note to reflect any of my changes or salient points. I have personally discussed the plan with the patient and/or family.  Volume status looks good. CXR viewed personally and is clearing slowly. BP remains low. Unable to titrate HF meds further now. No s/s angina. Continue ASA/Brilinta/statin. Can go to SDU. Supp K. Will need PT. Consider SGLT2i at discharge.   Glori Bickers, MD  11:17 AM

## 2018-08-15 NOTE — Progress Notes (Signed)
eLink Physician-Brief Progress Note Patient Name: [name] DOB: 06/27/1951 MRN: [id number]   Date of Service  08/15/2018  HPI/Events of Note  K+ = 2.9 and Creatinine = 1.62.   eICU Interventions  Will replace K+.      Intervention Category Major Interventions: Electrolyte abnormality - evaluation and management  Ranjit Ashurst [place] 08/15/2018, 6:24 AM

## 2018-08-16 LAB — CBC WITH DIFFERENTIAL/PLATELET
Abs Immature Granulocytes: 0.1 10*3/uL — ABNORMAL HIGH (ref 0.00–0.07)
BASOS ABS: 0.1 10*3/uL (ref 0.0–0.1)
Basophils Relative: 1 %
Eosinophils Absolute: 0.1 10*3/uL (ref 0.0–0.5)
Eosinophils Relative: 1 %
HCT: 37.8 % (ref 36.0–46.0)
Hemoglobin: 12.1 g/dL (ref 12.0–15.0)
Immature Granulocytes: 1 %
Lymphocytes Relative: 19 %
Lymphs Abs: 2.1 10*3/uL (ref 0.7–4.0)
MCH: 28 pg (ref 26.0–34.0)
MCHC: 32 g/dL (ref 30.0–36.0)
MCV: 87.5 fL (ref 80.0–100.0)
Monocytes Absolute: 1.1 10*3/uL — ABNORMAL HIGH (ref 0.1–1.0)
Monocytes Relative: 9 %
NRBC: 0 % (ref 0.0–0.2)
Neutro Abs: 7.9 10*3/uL — ABNORMAL HIGH (ref 1.7–7.7)
Neutrophils Relative %: 69 %
Platelets: 364 10*3/uL (ref 150–400)
RBC: 4.32 MIL/uL (ref 3.87–5.11)
RDW: 14.2 % (ref 11.5–15.5)
WBC: 11.3 10*3/uL — ABNORMAL HIGH (ref 4.0–10.5)

## 2018-08-16 LAB — MAGNESIUM: Magnesium: 2.3 mg/dL (ref 1.7–2.4)

## 2018-08-16 LAB — HEPATIC FUNCTION PANEL
ALBUMIN: 2 g/dL — AB (ref 3.5–5.0)
ALT: 17 U/L (ref 0–44)
AST: 18 U/L (ref 15–41)
Alkaline Phosphatase: 97 U/L (ref 38–126)
Bilirubin, Direct: 0.1 mg/dL (ref 0.0–0.2)
Indirect Bilirubin: 1.1 mg/dL — ABNORMAL HIGH (ref 0.3–0.9)
Total Bilirubin: 1.2 mg/dL (ref 0.3–1.2)
Total Protein: 6.6 g/dL (ref 6.5–8.1)

## 2018-08-16 LAB — BASIC METABOLIC PANEL
Anion gap: 16 — ABNORMAL HIGH (ref 5–15)
BUN: 23 mg/dL (ref 8–23)
CO2: 27 mmol/L (ref 22–32)
Calcium: 8.9 mg/dL (ref 8.9–10.3)
Chloride: 94 mmol/L — ABNORMAL LOW (ref 98–111)
Creatinine, Ser: 1.74 mg/dL — ABNORMAL HIGH (ref 0.44–1.00)
GFR calc Af Amer: 34 mL/min — ABNORMAL LOW (ref >60)
GFR calc non Af Amer: 29 mL/min — ABNORMAL LOW (ref >60)
GLUCOSE: 181 mg/dL — AB (ref 70–99)
Potassium: 4.5 mmol/L (ref 3.5–5.1)
Sodium: 137 mmol/L (ref 135–145)

## 2018-08-16 LAB — GLUCOSE, CAPILLARY
Glucose-Capillary: 171 mg/dL — ABNORMAL HIGH (ref 70–99)
Glucose-Capillary: 191 mg/dL — ABNORMAL HIGH (ref 70–99)
Glucose-Capillary: 191 mg/dL — ABNORMAL HIGH (ref 70–99)
Glucose-Capillary: 145 mg/dL — ABNORMAL HIGH (ref 70–99)

## 2018-08-16 LAB — PHOSPHORUS: Phosphorus: 4.6 mg/dL (ref 2.5–4.6)

## 2018-08-16 MED ORDER — LOSARTAN POTASSIUM 25 MG PO TABS
25.0000 mg | ORAL_TABLET | Freq: Every day | ORAL | Status: DC
  Administered 2018-08-16: 25 mg via ORAL
  Filled 2018-08-16: qty 1

## 2018-08-16 NOTE — Progress Notes (Signed)
PROGRESS NOTE  [name] DXI:[id number] DOB: 02/13/1951 DOA: 08/06/2018 PCP: [name], MD  Brief History   [id number] w/ a hx of CAD, lifelong smoker, and DM who was admitted 08/06/2018 with a non-ST elevation MI. She was scheduled for left heart catheterization 08/07/2018. While being placed on the table she had a PEA arrest that lasted for 4 minutes of CPR, including 1 round of epinephrine. He had a return of spontaneous circulation and was awake but was not mentating. She had a profound acidosis and was transferred to the heart unit where she was urgently intubated and placed on full mechanical ventilatory support. The patient underwent cardiac catheterization on 08/10/2018 and LAD stent was placed. She self-extubated on 08/11/2018. Her milrinone was stoppedon 08/12/2018. She was transferred to the floor on 08/15/2018 and [facility] assumed care.  A & P   Acute Hypoxic respiratory failuresecondary to pulmonary edema in setting of cardiogenic shock. The patient is s/p self-extubation 2/16. Today the patient is saturating in the high nineties on 2L O2. Will continue to wean as possible.  S/p PEA arrestsecondary to hypoxemia. This resulted in cardiogenic shock. The patient had 6 minutes of PEA arrest requiring 4 minutes of CPR and 1 amp of epinephrine on 08/07/2018 - TTE 2/9 noted EF 20-25% - suspect mixed ICM/NICM - care per CHF Team .  CAD: The patient underwent PCI of LAD on 2/13 - ASA and ticagrelor for cardiac stent.  Pulmonary Venous HTN: Per CHF Team.  Severe COPD: Well compensated at this time.Wean O2 as possible.   Hypokalemia: Supplement and monitor.  Hypomagnesemia: Supplement and monitor.  Hypophosphatemia: Supplement and monitor.  AKI: Creatinine is 1.74 today. Slowly increasing.  Diabetes Mellitus: A1c 10.2.  FSBS 135 - 191 in the last 24 hours.  DVT prophylaxis: SQ heparin Code Status: FULL CODE Family Communication: no family present at time of exam  Disposition Plan:  SDU   Jean Casagrande, DO [facility] Direct contact: see [email or web address]  7PM-7AM contact night coverage as above 08/16/2018, 6:53 PM  LOS: 10 days   Consultants  . Cardiology, pulmonology, CHF team  Procedures  . Intubation, PCI, extubation  Interval History/Subjective  The patient is resting comfortably. No new complaints.  Objective   Vitals:  Vitals:   08/16/18 0500 08/16/18 1546  BP: 106/77 95/65  Pulse: 71 69  Resp:    Temp: 97.6 F (36.4 C) (!) 97.4 F (36.3 C)  SpO2: 98% 100%    Exam:  Constitutional:  . The patient is awake, alert, and oriented x 3. No acute distress. Respiratory:  . No wheezes, rales, or rhonchi. No tactile fremitus. . No increased work of breathing. No wheezes, rales, or rhonchi.  Cardiovascular:  . Regular rate and rhythm. . No LE extremity edema   . Normal pedal pulses Abdomen:  . Abdomen soft, non-tender, non-distended. . No hernias, masses, or organomegaly is appreciated. . Normoactive bowel sounds.  Musculoskeletal:  . No cyanosis, clubbing, or edema. Skin:  . No rashes, lesions, ulcers . palpation of skin: no induration or nodules Neurologic:  . CN 2-12 intact . Patient is moving all extremities. Psychiatric:  . Mental status o Mood, affect appropriate o Orientation to person, place, time  . judgment and insight appear intact     I have personally reviewed the following:   Today's Data  . Chemistry, CBC  .   Scheduled Meds: . aspirin  81 mg Oral Daily  . atorvastatin  40 mg Oral q1800  . Chlorhexidine Gluconate  Cloth  6 each Topical Daily  . digoxin  0.125 mg Oral Daily  . heparin injection (subcutaneous)  5,000 Units Subcutaneous Q8H  . insulin aspart  0-20 Units Subcutaneous TID WC  . insulin aspart  0-5 Units Subcutaneous QHS  . losartan  25 mg Oral QHS  . mouth rinse  15 mL Mouth Rinse BID  . multivitamin with minerals  1 tablet Oral Daily  . pantoprazole  40 mg Oral Daily  . senna  2 tablet Oral  Daily  . sodium chloride flush  10-40 mL Intracatheter Q12H  . spironolactone  25 mg Oral Daily  . ticagrelor  90 mg Oral BID   Continuous Infusions:  Principal Problem:   NSTEMI (non-ST elevated myocardial infarction) ([place]) Active Problems:   Hypokalemia   Diabetes mellitus ([place])   NSTEMI, initial episode of care ([place])   Respiratory failure ([place])   Acute congestive heart failure ([place])   LOS: 10 days

## 2018-08-16 NOTE — Progress Notes (Signed)
  Speech Language Pathology Treatment: Dysphagia  Patient Details Name: [name] MRN: [id number] DOB: 1951/01/24 Today's Date: 08/16/2018 Time: 6837-2902 SLP Time Calculation (min) (ACUTE ONLY): 10 min  Assessment / Plan / Recommendation Clinical Impression  Pt did not have overt coughing during PO trials (Dys 3, honey thick liquids) offered today, but she did have immediate throat clearing and her vocal quality remains hoarse. She remains afebrile and her lung sounds are clear per chart, suggestive of diet tolerance. She does not believe she has had improvements in her vocal quality. Would continue current diet for now, but when ready to repeat swallow study would consider FEES to better visualize vocal folds. May want to do this by the end of this week to allow for additional time post-extubation.   HPI HPI: Pt is a 68 yo female admitted 2/9 with NSTEMI. PEA arrest on 2/10. Pt was intubated on 2/10, self-extubated 2/14. Cardiac cath 2/13. PMH: CAD, HTN, DM, tobacco abuse.  CXR 2/14 concerning for bibasilar atelectasis or infiltrates.      SLP Plan  Continue with current plan of care       Recommendations  Diet recommendations: Dysphagia 3 (mechanical soft);Honey-thick liquid Liquids provided via: Cup;No straw Medication Administration: Whole meds with puree Supervision: Patient able to self feed Compensations: Slow rate;Small sips/bites Postural Changes and/or Swallow Maneuvers: Seated upright 90 degrees                Oral Care Recommendations: Oral care BID Follow up Recommendations: Home health SLP SLP Visit Diagnosis: Dysphagia, oropharyngeal phase (R13.12) Plan: Continue with current plan of care       GO                Venita Sheffield Nneoma Harral 08/16/2018, 3:05 PM  Nuala Alpha, M.A. [place] Acute Environmental education officer [phone] Office [phone]

## 2018-08-16 NOTE — Progress Notes (Addendum)
Patient ID: [name], female   DOB: [birth year], 68 y.o.   MRN: [id number]     Advanced Heart Failure Rounding Note  PCP-Cardiologist: No primary care provider on file.   Subjective:    2/9 Admitted 2/9 with [place] and respiratory distress 2/10 PEA arrest 2/13 Underwent PCI of LAD 2/13 self-extubated 2/15 Milrinone stopped  Yesterday diuretics held. Denies SOB.   RHC 08/10/18 LAD 80%---> DES LAD    RA 10  PA 60/28 (43) PCWP 29  CI 1.8   Echo 08/06/2018 - EF 25% , RV normal mild TR, and severe HK.   Objective:   Weight Range: 58.6 kg Body mass index is 23.63 kg/m.   Vital Signs:   Temp:  [97.5 F (36.4 C)-97.6 F (36.4 C)] 97.6 F (36.4 C) (02/19 0500) Pulse Rate:  [60-98] 71 (02/19 0500) Resp:  [13-31] 20 (02/18 2355) BP: (99-118)/(57-77) 106/77 (02/19 0500) SpO2:  [91 %-100 %] 98 % (02/19 0500) Last BM Date: 08/14/18  Weight change: Filed Weights   08/13/18 0317 08/14/18 0420 08/15/18 0452  Weight: 60.1 kg 59.3 kg 58.6 kg    Intake/Output:   Intake/Output Summary (Last 24 hours) at 08/16/2018 0736 Last data filed at 08/16/2018 0600 Gross per 24 hour  Intake 324.59 ml  Output -  Net 324.59 ml    Physical Exam   CVP 1-2 personally checked General:  Thin. No resp difficulty HEENT: normal Neck: supple. no JVD. Carotids 2+ bilat; no bruits. No lymphadenopathy or thryomegaly appreciated. Cor: PMI nondisplaced. Regular rate & rhythm. No rubs, gallops or murmurs. Lungs: clear Abdomen: soft, nontender, nondistended. No hepatosplenomegaly. No bruits or masses. Good bowel sounds. Extremities: no cyanosis, clubbing, rash, edema. RUE PICC Neuro: alert & orientedx3, cranial nerves grossly intact. moves all 4 extremities w/o difficulty. Affect pleasant   Telemetry   NSR 80-90s with PVCs  personally reviewed.    EKG    No new tracings.    Labs    CBC Recent Labs    08/15/18 0401 08/16/18 0526  WBC 11.7* 11.3*  NEUTROABS 8.2* 7.9*  HGB 12.1 12.1  HCT  37.8 37.8  MCV 87.3 87.5  PLT 340 324   Basic Metabolic Panel Recent Labs    08/15/18 0401 08/16/18 0526  NA 140 137  K 2.9* 4.5  CL 94* 94*  CO2 30 27  GLUCOSE 118* 181*  BUN 23 23  CREATININE 1.62* 1.74*  CALCIUM 8.6* 8.9  MG 1.8 2.3  PHOS 4.8* 4.6   Liver Function Tests Recent Labs    08/16/18 0526  AST 18  ALT 17  ALKPHOS 97  BILITOT 1.2  PROT 6.6  ALBUMIN 2.0*   No results for input(s): LIPASE, AMYLASE in the last 72 hours. Cardiac Enzymes No results for input(s): CKTOTAL, CKMB, CKMBINDEX, TROPONINI in the last 72 hours.  BNP: BNP (last 3 results) Recent Labs    08/06/18 0628  BNP 1,535.2*    ProBNP (last 3 results) No results for input(s): PROBNP in the last 8760 hours.   D-Dimer No results for input(s): DDIMER in the last 72 hours. Hemoglobin A1C No results for input(s): HGBA1C in the last 72 hours. Fasting Lipid Panel No results for input(s): CHOL, HDL, LDLCALC, TRIG, CHOLHDL, LDLDIRECT in the last 72 hours. Thyroid Function Tests No results for input(s): TSH, T4TOTAL, T3FREE, THYROIDAB in the last 72 hours.  Invalid input(s): FREET3  Other results:   Imaging    Dg Chest Port 1 View  Result Date: 08/15/2018 CLINICAL  DATA:  Acute systolic (congestive) heart failure EXAM: PORTABLE CHEST 1 VIEW COMPARISON:  Is 08/14/2018 FINDINGS: RIGHT IJ sheath has been removed. RIGHT-sided PICC line tip overlies the superior vena cava. The heart is enlarged. There are patchy opacities at the lung bases partially obscuring hemidiaphragms and stable. There is persistent patchy opacities in the UPPER lobes bilaterally. IMPRESSION: 1. Interval removal of RIGHT IJ sheath. 2. Persistent bilateral opacities, stable. Electronically Signed   By: Nolon Nations M.D.   On: 08/15/2018 10:10     Medications:     Scheduled Medications: . aspirin  81 mg Oral Daily  . atorvastatin  40 mg Oral q1800  . Chlorhexidine Gluconate Cloth  6 each Topical Daily  . digoxin   0.125 mg Oral Daily  . heparin injection (subcutaneous)  5,000 Units Subcutaneous Q8H  . insulin aspart  0-20 Units Subcutaneous TID WC  . insulin aspart  0-5 Units Subcutaneous QHS  . losartan  12.5 mg Oral QHS  . mouth rinse  15 mL Mouth Rinse BID  . multivitamin with minerals  1 tablet Oral Daily  . pantoprazole  40 mg Oral Daily  . senna  2 tablet Oral Daily  . sodium chloride flush  10-40 mL Intracatheter Q12H  . spironolactone  25 mg Oral Daily  . ticagrelor  90 mg Oral BID    Infusions:   PRN Medications: acetaminophen, albuterol, ondansetron **OR** ondansetron (ZOFRAN) IV, RESOURCE THICKENUP CLEAR, sodium chloride flush, sodium chloride flush  Patient Profile   [name] is a 68 year old with a history of DMII, HTN, and smoker admitted with increased shortness of breath and chest pain. Troponin 7.5>6.5>6.3 and BNP 1535. CXR was concerning for pulmonary edema so IV lasix was given. Taken to the cath lab on 2/10 but this was aborted due to PEA arrest.  Assessment/Plan   1. Acute Hypoxic Respiratory Failure--> PEA arrest  - Intubated on 2/10 with CCM managing. Now on nasal cannula.     - CXR 2/18  with patchy infiltrates  -? Component of ARDS or more likely aspiration with PEA arrest (and baseline COPD).   No fever and WBCs 11.3. CCM primary.  She is not on antibiotics.     2. PEA arrest on 2/10  - Due to hypoxia in setting of severe COPD and CHF - No further.   3. Acute systolic HR -> Cardiogenic Shock in the setting of NSTEMI/PEA arrest  - Suspect mixed ICM. NICM. S/p PCI  80% LAD on 12/15. ECHO 2/9 EF 20-25%  - Initial hemodynamics c/w shock. Now improving and off milrinone.  - CVP 1-2. Hold diuretics.    - She remains on digoxin. Dig level 0.6. - Continue spironolactone 25 mg daily - Continue losartan 12.5 mg qhs.  - No room to titrate meds.   - Potsssium stable.   4. Pulmonary Hypertension  - Mostly pulmonary venous HTN.  PVR 4.7 No formal diagnosis of COPD  but has long history of smoking.  - Now off milrinone.    4. NSTEMI  - peak troponin 7.5 - 08/10/18 LHC with DES to 80% mLAD - No chest pain.  - Now passed swallow and on ticagrelor + ASA. No change.   5. DMII - SS per primary.  - Hgb A 1 C 10.2   6. Hypokalemia - Resolved.      Length of Stay: [place], NP  7:36 AM Advanced Heart Failure Team Pager [phone] (M-F; 7a - 4p)  Please  contact [facility] for night-coverage after hours (4p -7a ) and weekends on [email or web address]  Patient seen and examined with the above-signed Advanced Practice Provider and/or Housestaff. I personally reviewed laboratory data, imaging studies and relevant notes. I independently examined the patient and formulated the important aspects of the plan. I have edited the note to reflect any of my changes or salient points. I have personally discussed the plan with the patient and/or family.  Doing well from cardiac standpoint. Volume status looks good. No angina. Increase losartan. No b-blocker yet. Continue to hold diuretics for now.   Glori Bickers, MD  2:42 PM

## 2018-08-16 NOTE — Plan of Care (Signed)

## 2018-08-16 NOTE — Plan of Care (Signed)

## 2018-08-16 NOTE — Progress Notes (Signed)
PT Cancellation Note  Patient Details Name: [name] MRN: [id number] DOB: 02/24/1951   Cancelled Treatment:    Reason Eval/Treat Not Completed: Fatigue/lethargy limiting ability to participate. Reports agreeable to stair training next session. Will follow-up for PT treatment.  Mabeline Caras, PT, DPT Acute Rehabilitation Services  Pager [phone] Office [place] 08/16/2018, 4:59 PM

## 2018-08-16 NOTE — Progress Notes (Signed)
CARDIAC REHAB PHASE I   PRE:  Rate/Rhythm: 83 SR, 60 JR, 90 SR    BP: sitting 118/70    SaO2: 100 2L, 95 RA  MODE:  Ambulation: 194 ft   POST:  Rate/Rhythm: 98 SR    BP: sitting 121/70     SaO2: 95 2L  Pt moving well. Used rollator and assist x2 with gait belt (x1 for equipment). Attempted on RA and she became tired after 45 ft and sat to rest.  SaO2 difficult to register. Eventually 95 RA after several minutes. Put on 2L for rest of walk. Able to walk 150 ft more without rest. To recliner. Left on 2L. Pts rhythm fluctuates from JR to NSR. Asx. Gave pt IS and she is inspiring 700 mL. [place], ACSM 08/16/2018 11:59 AM

## 2018-08-16 NOTE — Care Management Important Message (Signed)
Important Message  Patient Details  Name: [name] MRN: [id number] Date of Birth: 06-Nov-1950   Medicare Important Message Given:  Yes    Barb Merino [place] 08/16/2018, 12:27 PM

## 2018-08-16 NOTE — Progress Notes (Signed)
Pt. Refused the bipap.

## 2018-08-17 LAB — CBC WITH DIFFERENTIAL/PLATELET
ABS IMMATURE GRANULOCYTES: 0.09 10*3/uL — AB (ref 0.00–0.07)
Basophils Absolute: 0.1 10*3/uL (ref 0.0–0.1)
Basophils Relative: 1 %
Eosinophils Absolute: 0.1 10*3/uL (ref 0.0–0.5)
Eosinophils Relative: 1 %
HCT: 38.4 % (ref 36.0–46.0)
Hemoglobin: 12.7 g/dL (ref 12.0–15.0)
Immature Granulocytes: 1 %
LYMPHS PCT: 16 %
Lymphs Abs: 1.9 10*3/uL (ref 0.7–4.0)
MCH: 28.7 pg (ref 26.0–34.0)
MCHC: 33.1 g/dL (ref 30.0–36.0)
MCV: 86.7 fL (ref 80.0–100.0)
Monocytes Absolute: 0.9 10*3/uL (ref 0.1–1.0)
Monocytes Relative: 8 %
Neutro Abs: 8.4 10*3/uL — ABNORMAL HIGH (ref 1.7–7.7)
Neutrophils Relative %: 73 %
Platelets: 379 10*3/uL (ref 150–400)
RBC: 4.43 MIL/uL (ref 3.87–5.11)
RDW: 14.1 % (ref 11.5–15.5)
WBC: 11.5 10*3/uL — ABNORMAL HIGH (ref 4.0–10.5)
nRBC: 0 % (ref 0.0–0.2)

## 2018-08-17 LAB — BASIC METABOLIC PANEL
Anion gap: 15 (ref 5–15)
BUN: 22 mg/dL (ref 8–23)
CO2: 25 mmol/L (ref 22–32)
CREATININE: 1.74 mg/dL — AB (ref 0.44–1.00)
Calcium: 8.8 mg/dL — ABNORMAL LOW (ref 8.9–10.3)
Chloride: 97 mmol/L — ABNORMAL LOW (ref 98–111)
GFR calc Af Amer: 34 mL/min — ABNORMAL LOW (ref >60)
GFR calc non Af Amer: 29 mL/min — ABNORMAL LOW (ref >60)
GLUCOSE: 160 mg/dL — AB (ref 70–99)
Potassium: 4 mmol/L (ref 3.5–5.1)
Sodium: 137 mmol/L (ref 135–145)

## 2018-08-17 LAB — MAGNESIUM: Magnesium: 2.1 mg/dL (ref 1.7–2.4)

## 2018-08-17 LAB — GLUCOSE, CAPILLARY
GLUCOSE-CAPILLARY: 154 mg/dL — AB (ref 70–99)
GLUCOSE-CAPILLARY: 142 mg/dL — AB (ref 70–99)

## 2018-08-17 LAB — PHOSPHORUS: Phosphorus: 4.6 mg/dL (ref 2.5–4.6)

## 2018-08-17 MED ORDER — DIGOXIN 125 MCG PO TABS: 0.1250 mg | ORAL_TABLET | Freq: Every day | ORAL | 0 refills | Status: DC

## 2018-08-17 MED ORDER — ASPIRIN 81 MG PO CHEW: 81.0000 mg | CHEWABLE_TABLET | Freq: Every day | ORAL | 0 refills | Status: AC

## 2018-08-17 MED ORDER — ADULT MULTIVITAMIN W/MINERALS CH: 1.0000 | ORAL_TABLET | Freq: Every day | ORAL | 0 refills | Status: DC

## 2018-08-17 MED ORDER — TICAGRELOR 90 MG PO TABS: 90.0000 mg | ORAL_TABLET | Freq: Two times a day (BID) | ORAL | 0 refills | Status: AC

## 2018-08-17 MED ORDER — PANTOPRAZOLE SODIUM 40 MG PO TBEC: 40.0000 mg | DELAYED_RELEASE_TABLET | Freq: Every day | ORAL | 0 refills | Status: AC

## 2018-08-17 MED ORDER — ATORVASTATIN CALCIUM 40 MG PO TABS: 40.0000 mg | ORAL_TABLET | Freq: Every day | ORAL | 0 refills | Status: DC

## 2018-08-17 MED ORDER — SPIRONOLACTONE 25 MG PO TABS: 25.0000 mg | ORAL_TABLET | Freq: Every day | ORAL | 0 refills | Status: DC

## 2018-08-17 MED ORDER — LOSARTAN POTASSIUM 25 MG PO TABS: 25.0000 mg | ORAL_TABLET | Freq: Every day | ORAL | 0 refills | Status: DC

## 2018-08-17 MED FILL — ATORVASTATIN CALCIUM 40 MG: 40 | 30 days supply | Qty: 30 | Fill #0

## 2018-08-17 MED FILL — PANTOPRAZOLE SOD DR 40 MG T: 40 | 30 days supply | Qty: 30 | Fill #0

## 2018-08-17 MED FILL — ASPIRIN LOW DOSE 81 MG CHEW: 81 | 30 days supply | Qty: 30 | Fill #0

## 2018-08-17 MED FILL — BRILINTA 90 MG TABLET: 90 | 30 days supply | Qty: 60 | Fill #0

## 2018-08-17 MED FILL — LOSARTAN POTASSIUM 25 MG TA: 25 | 30 days supply | Qty: 30 | Fill #0

## 2018-08-17 MED FILL — DIGOXIN 0.125 MG TABLET: 125 | 30 days supply | Qty: 30 | Fill #0

## 2018-08-17 MED FILL — SPIRONOLACTONE 25 MG TABLET: 25 | 30 days supply | Qty: 30 | Fill #0

## 2018-08-17 NOTE — Progress Notes (Signed)
Patient discharged home in stable condition. AVS reviewed and questions answered. Medications delivered by [facility]. PICC line removed.

## 2018-08-17 NOTE — Care Management Note (Signed)
Case Management Note  Patient Details  Name: [name] MRN: [id number] Date of Birth: 10-18-1950  Subjective/Objective:  Pt presented for Nstemi- PEA Cardiac Arrest. PTA from home with spouse. Previous CM set patient up with [facility] for [place]- RN Added per patient request.                   Action/Plan: CM sent message to MD for [facility] Orders and F2F with DME Rollator. [facility] aware that patient will transition home today. DME rollator to be delivered to room prior to transition home. CM did consult CSW for [facility] Voucher home. No further needs from CM at this time.   Expected Discharge Date:  08/17/18               Expected Discharge Plan:  [place]  In-House Referral:  NA  Discharge planning Services  CM Consult, Medication Assistance([place] benefits check)  Post Acute Care Choice:  Durable Medical Equipment, Home Health Choice offered to:  Patient, Spouse  DME Arranged:  [place] rolling with seat DME Agency:  [place] Arranged:  PT, RN, Disease Management [place] Agency:  [place]  Status of Service:  Completed, signed off  If discussed at [place] of Stay Meetings, dates discussed:    Additional Comments:  Bethena Roys, RN 08/17/2018, 1:00 PM

## 2018-08-17 NOTE — Progress Notes (Signed)
Physical Therapy Treatment Patient Details Name: [name] MRN: [id number] DOB: 03/24/1951 Today's Date: 08/17/2018    History of Present Illness Pt is a 68 y.o. female admitted 08/06/18 with NSTEMI. PEA cardiac arrest on 2/10 requiring CPR. ETT 2/10-2/14. S/p R heart cath with PCI of LAD on 2/13. CXR 2/18 with patchy infiltrates. PMH includes CAD, HTN, DM, tobacco abuse.   PT Comments    Pt progressing well with mobility. Ambulated without device, intermittent min guard for balance; required multiple standing rest breaks secondary to fatigue. Discussed rollator use for community ambulation for energy conservation. Increased time discussing this, stair education, and fall risk reduction. SpO2 97-100% on RA at rest, unable to get reliable pulse ox read during ambulation (RN aware). Continue to recommend HHPT services.    Follow Up Recommendations  Home health PT;Supervision for mobility/OOB     Equipment Recommendations  Other (comment)(rollator)    Recommendations for Other Services       Precautions / Restrictions Precautions Precautions: Fall Precaution Comments: Difficult to get SpO2 read Restrictions Weight Bearing Restrictions: No    Mobility  Bed Mobility               General bed mobility comments: Received sitting in recliner  Transfers Overall transfer level: Needs assistance Equipment used: None Transfers: Sit to/from Stand Sit to Stand: Supervision            Ambulation/Gait Ambulation/Gait assistance: Supervision;Min guard Gait Distance (Feet): 100 Feet Assistive device: None Gait Pattern/deviations: Step-through pattern;Decreased stride length Gait velocity: Decreased Gait velocity interpretation: 1.31 - 2.62 ft/sec, indicative of limited community ambulator General Gait Details: Slow, mostly steady gait with intermittent min guard for balance. Pt with 3x standing rest breaks holding onto hallway rail due to fatigue. Amb on RA, SpO2 2x reading  97%, but then unable to get pulse ox read   Stairs Stairs: (Unable today due to fatigue; educ on technique with rail and assist from wife, standing rest breaks as needed; pt then reports she has another entrance option with less steps)           Wheelchair Mobility    Modified Rankin (Stroke Patients Only)       Balance Overall balance assessment: Needs assistance   Sitting balance-Leahy Scale: Fair       Standing balance-Leahy Scale: Good                              Cognition Arousal/Alertness: Awake/alert Behavior During Therapy: WFL for tasks assessed/performed Overall Cognitive Status: Within Functional Limits for tasks assessed                                        Exercises      General Comments        Pertinent Vitals/Pain Pain Assessment: No/denies pain    Home Living                      Prior Function            PT Goals (current goals can now be found in the care plan section) Acute Rehab PT Goals Patient Stated Goal: Return home today or tomorrow PT Goal Formulation: With patient Time For Goal Achievement: 08/27/18 Potential to Achieve Goals: Good Progress towards PT goals: Progressing toward goals    Frequency  Min 3X/week      PT Plan Current plan remains appropriate    Co-evaluation              AM-PAC PT "6 Clicks" Mobility   Outcome Measure  Help needed turning from your back to your side while in a flat bed without using bedrails?: None Help needed moving from lying on your back to sitting on the side of a flat bed without using bedrails?: None Help needed moving to and from a bed to a chair (including a wheelchair)?: A Little Help needed standing up from a chair using your arms (e.g., wheelchair or bedside chair)?: A Little Help needed to walk in hospital room?: A Little Help needed climbing 3-5 steps with a railing? : A Little 6 Click Score: 20    End of Session  Equipment Utilized During Treatment: Gait belt Activity Tolerance: Patient tolerated treatment well Patient left: in chair;with call bell/phone within reach Nurse Communication: Mobility status PT Visit Diagnosis: Difficulty in walking, not elsewhere classified (R26.2)     Time: 8295-6213 PT Time Calculation (min) (ACUTE ONLY): 27 min  Charges:  $Gait Training: 8-22 mins $Self Care/Home Management: 8-22                    Mabeline Caras, PT, DPT Acute Rehabilitation Services  Pager [phone] Office [place] 08/17/2018, 10:34 AM

## 2018-08-17 NOTE — Discharge Summary (Signed)
Physician Discharge Summary  [name] WYO:[id number] DOB: [birth year] DOA: 08/06/2018  PCP: [name], MD  Admit date: 08/06/2018 Discharge date: 08/17/2018  Recommendations for Outpatient Follow-up:  1. Patient is to follow up with PCP in 7-10 days. She will follow up with cardiology as directed. She is to have [facility] PT and RN. She will have a rollator delivered for DME.  [place] Follow up.   Why:  rollator Contact information: [address] [place] [address] [phone]        Health, [facility] Follow up.   Specialty:  Home Health Services Why:  Home Health physical therapy Contact information: [place] [address] [place] Follow up on 08/29/2018.   Specialty:  Cardiology Why:  Heart Failure Follow up 08/29/2018 @ 12:00noon -Parking in ER lot (enter under blue awning to left of ER), or underneath [place] in the [place] on [address] (garage code:[id number], Media planner to 1st floor).  -Take all am meds and bring all med bottles Contact information: [address] [id number] [place] [phone]           Discharge Diagnoses: Principal diagnosis is #1 1. Acute hypoxic respiratory failure secondary to pulmonary edema/cardiogenic shock. 2. S/P PEA arrest due to hypoxemia resulting in cardiogenic shock.  3. CAD: S/P PCI of LAD on 2/13. She is to continue ASA and ticagrelor for the stent. 4. Pulmonary venous hypertension 5. Severe COPD 6. Hypokalemia 7. Hypomagnesemia 8. AKI 9. Diabetes Mellitis: A1c is 10.2  Discharge Condition: Fair Disposition: Home with home health.  Diet recommendation: Heart healthy, carbohydrate modified.  Filed Weights   08/14/18 0420 08/15/18 0452 08/17/18 0530  Weight: 59.3 kg 58.6 kg 58.4 kg    History of present illness: 68yo w/ a hx of  CAD, lifelong smoker,and DMwho was admitted 08/06/2018 with a non-ST elevation MI. She was scheduled for left heart catheterization 08/07/2018. While being placed on thetable she had a PEA arrest that lasted for 4 minutes of CPR, including1 round of epinephrine. He hadareturn of spontaneous circulationandwas awakebutwas not mentating. She had a profound acidosis and was transferred to the heart unit where she was urgently intubated and placed on full mechanical ventilatory support. The patient underwent cardiac catheterization on 08/10/2018 and LAD stent was placed. She self-extubated on 08/11/2018. Her milrinone was stoppedon 08/12/2018. She was transferred to the floor on 08/15/2018 and [facility] assumed care.   Hospital Course:  The patient was followed by the heart failure team on the floor. She was continued on lipitor, spironolactone, brililnta, and ASA. Her oxygen was gradually weaned to off. She has been evaluated by PT/OT and recommendation was made for discharge to home with home health and a rollator.  She will be discharged to home today in fair condition.  Today's assessment: S: The patient is awake, alert, and oriented x 3. No acute distress. O: Vitals:  Vitals:   08/17/18 0530 08/17/18 1306  BP: (!) 96/51 103/70  Pulse: 67 74  Resp: 20   Temp: 97.6 F (36.4 C) 97.6 F (36.4 C)  SpO2: 99% 94%   Constitutional:   The patient is awake, alert, and oriented x 3. No acute distress. Respiratory:   No wheezes, rales, or rhonchi. No tactile fremitus.  No increased work of breathing. No wheezes, rales, or rhonchi.  Cardiovascular:  Regular rate and rhythm.  No LE extremity edema    Normal pedal pulses Abdomen:   Abdomen soft, non-tender, non-distended.  No hernias, masses, or organomegaly is appreciated.  Normoactive bowel sounds.  Musculoskeletal:   No cyanosis, clubbing, or edema. Skin:   No rashes, lesions, ulcers  palpation of skin: no induration or  nodules Neurologic:   CN 2-12 intact  Patient is moving all extremities. Psychiatric:   Mental status ? Mood, affect appropriate ? Orientation to person, place, time   judgment and insight appear intact      Discharge Instructions  Discharge Instructions    (HEART FAILURE PATIENTS) Call MD:  Anytime you have any of the following symptoms: 1) 3 pound weight gain in 24 hours or 5 pounds in 1 week 2) shortness of breath, with or without a dry hacking cough 3) swelling in the hands, feet or stomach 4) if you have to sleep on extra pillows at night in order to breathe.   Complete by:  As directed    AMB referral to CHF clinic   Complete by:  As directed    Activity as tolerated - No restrictions   Complete by:  As directed    Amb Referral to Cardiac Rehabilitation   Complete by:  As directed    Diagnosis:   NSTEMI Coronary Stents     Call MD for:  difficulty breathing, headache or visual disturbances   Complete by:  As directed    Diet - low sodium heart healthy   Complete by:  As directed    Diet - low sodium heart healthy   Complete by:  As directed    Discharge instructions   Complete by:  As directed    The patient is to make an appointment to follow up with the CHF clinic in the next 2 weeks and with her PCP in 7-10 days.   Heart Failure patients record your daily weight using the same scale at the same time of day   Complete by:  As directed    Increase activity slowly   Complete by:  As directed    Increase activity slowly   Complete by:  As directed    STOP any activity that causes chest pain, shortness of breath, dizziness, sweating, or exessive weakness   Complete by:  As directed      Allergies as of 08/17/2018   No Known Allergies     Medication List    STOP taking these medications   hydrochlorothiazide 12.5 MG capsule Commonly known as:  MICROZIDE     TAKE these medications   aspirin 81 MG chewable tablet Chew 1 tablet (81 mg total) by mouth  daily. Start taking on:  August 18, 2018   atorvastatin 40 MG tablet Commonly known as:  LIPITOR Take 1 tablet (40 mg total) by mouth daily at 6 PM.   digoxin 0.125 MG tablet Commonly known as:  LANOXIN Take 1 tablet (0.125 mg total) by mouth daily. Start taking on:  August 18, 2018   losartan 25 MG tablet Commonly known as:  COZAAR Take 1 tablet (25 mg total) by mouth at bedtime.   metFORMIN 1000 MG tablet Commonly known as:  GLUCOPHAGE Take 1,000 mg by mouth 2 (two) times daily with a meal.   multivitamin with minerals Tabs tablet Take 1 tablet by mouth daily. Start taking on:  August 18, 2018   pantoprazole 40 MG tablet Commonly known as:  PROTONIX Take 1 tablet (40 mg  total) by mouth daily. Start taking on:  August 18, 2018   spironolactone 25 MG tablet Commonly known as:  ALDACTONE Take 1 tablet (25 mg total) by mouth daily. Start taking on:  August 18, 2018   ticagrelor 90 MG Tabs tablet Commonly known as:  BRILINTA Take 1 tablet (90 mg total) by mouth 2 (two) times daily.   TOUJEO MAX SOLOSTAR 300 UNIT/ML Sopn Generic drug:  Insulin Glargine (2 Unit Dial) Inject 50 Units into the skin at bedtime.            Durable Medical Equipment  (From admission, onward)         Start     Ordered   08/17/18 1313  For home use only DME 4 wheeled rolling walker with seat  (Walkers)  Once    Question:  Patient needs a walker to treat with the following condition  Answer:  Debility   08/17/18 1313         No Known Allergies  The results of significant diagnostics from this hospitalization (including imaging, microbiology, ancillary and laboratory) are listed below for reference.    Significant Diagnostic Studies: Dg Chest 1 View  Result Date: 08/06/2018 CLINICAL DATA:  68 year old female with history of shortness of breath and cough for the past 2-3 days. Central chest pain while coughing. EXAM: CHEST  1 VIEW COMPARISON:  Chest x-ray 08/06/2018.  FINDINGS: There is cephalization of the pulmonary vasculature, indistinctness of the interstitial markings, and patchy airspace disease throughout the lungs bilaterally suggestive of moderate pulmonary edema. Bibasilar opacities may reflect additional areas of atelectasis and/or consolidation. Small bilateral pleural effusions. Mild cardiomegaly. Upper mediastinal contours are within normal limits. IMPRESSION: 1. The appearance the chest again suggest congestive heart failure, as above. The possibility of superimposed infection is not entirely excluded. Electronically Signed   By: Vinnie Langton M.D.   On: 08/06/2018 14:10   Dg Chest 2 View  Result Date: 08/06/2018 CLINICAL DATA:  Nausea, emesis, fatigue and dizziness. EXAM: CHEST - 2 VIEW COMPARISON:  None. FINDINGS: The heart is borderline enlarged. There is mild tortuosity of the thoracic aorta. Bilateral pleural effusions and increased interstitial markings with some Kerley B lines suggesting pulmonary edema. There are also patchy areas of airspace consolidation more suspicious for superimposed pneumonia. The bony thorax is intact. IMPRESSION: 1. Cardiac enlargement, central vascular congestion, interstitial edema and pleural effusions suggesting CHF. 2. Superimposed patchy infiltrates and atelectasis. Electronically Signed   By: Marijo Sanes M.D.   On: 08/06/2018 06:53   Dg Chest Port 1 View  Result Date: 08/15/2018 CLINICAL DATA:  Acute systolic (congestive) heart failure EXAM: PORTABLE CHEST 1 VIEW COMPARISON:  Is 08/14/2018 FINDINGS: RIGHT IJ sheath has been removed. RIGHT-sided PICC line tip overlies the superior vena cava. The heart is enlarged. There are patchy opacities at the lung bases partially obscuring hemidiaphragms and stable. There is persistent patchy opacities in the UPPER lobes bilaterally. IMPRESSION: 1. Interval removal of RIGHT IJ sheath. 2. Persistent bilateral opacities, stable. Electronically Signed   By: Nolon Nations M.D.    On: 08/15/2018 10:10   Dg Chest Port 1 View  Result Date: 08/14/2018 CLINICAL DATA:  68 year old female with shortness breath and pulmonary edema. Subsequent encounter. EXAM: PORTABLE CHEST 1 VIEW COMPARISON:  08/13/2018 FINDINGS: Right jugular catheter tip projects at the expected level of the right subclavian vein, without change. Correlation with blood return recommended (this was introduced used for previously inserted swan Ganz catheter). Right PICC line tip proximal  right atrium level. Change in position from prior exam may be related to differences in angulation of imaging. Attention to this on follow-up. Alternatively, PICC line can be retracted by 2 cm. Cardiomegaly. Diffuse airspace disease with patchy appearance mid to lower lung zones bilaterally may represent pulmonary edema with pleural effusions. Cannot exclude lower lobe infectious process in the proper clinical setting. Overall appearance relatively similar to prior exam (when taking into account differences in technique). No pneumothorax detected. IMPRESSION: 1. Diffuse airspace disease with patchy appearance mid to lower lung zones bilaterally may represent pulmonary edema with pleural effusions. Cannot exclude lower lobe infectious process in the proper clinical setting. Overall appearance relatively similar to prior exam (when taking into account differences in technique). 2. Cardiomegaly. 3. Right PICC line tip proximal right atrium level. Apparent change in position from prior exam may be related to differences in angulation of imaging. Attention to this on follow-up. Alternatively, PICC line can be retracted by 2 cm. 4. Right jugular catheter tip projects at the expected level of the right subclavian vein, without change. Electronically Signed   By: Genia Del M.D.   On: 08/14/2018 07:41   Dg Chest Port 1 View  Result Date: 08/13/2018 CLINICAL DATA:  Pulmonary edema EXAM: PORTABLE CHEST 1 VIEW COMPARISON:  August 11, 2018  FINDINGS: Swan-Ganz catheter has been removed. Cordis tip is in the superior vena cava near the junction with the right jugular vein. Peripherally inserted central catheter tip is at the cavoatrial junction. No pneumothorax. There is cardiomegaly with mild pulmonary venous hypertension. There are pleural effusions bilaterally with interstitial edema in the lower lobe regions. There is patchy airspace opacity bilaterally, primarily in the mid and lower lung zone regions. There is consolidation in the medial bases. No adenopathy evident. No bone lesions. IMPRESSION: Catheter positions as described without pneumothorax. Pulmonary vascular congestion with interstitial edema and small pleural effusions. Suspect a degree of congestive heart failure. Patchy areas of airspace opacity bilaterally raise question of multifocal pneumonia versus alveolar edema. Both entities may be present concurrently. It should be noted that a degree of superimposed ARDS is also possible. Electronically Signed   By: Lowella Grip III M.D.   On: 08/13/2018 07:39   Dg Chest Port 1 View  Result Date: 08/11/2018 CLINICAL DATA:  Respiratory failure. EXAM: PORTABLE CHEST 1 VIEW COMPARISON:  08/10/2018 FINDINGS: The endotracheal tube and NG tubes have been removed. The right IJ Swan-Ganz catheter is stable with the tip in the proximal right pulmonary artery. Slight worsening lung aeration with lower lung volumes, likely due to the extubation. Persistent diffuse but asymmetric interstitial and airspace process in the lungs along with bilateral pleural effusions and bibasilar atelectasis or infiltrates. IMPRESSION: 1. Removal of the ET and NG tubes with interval slight worsening lung aeration. 2. Stable right IJ Swan-Ganz catheter. 3. Persistent diffuse but asymmetric interstitial and airspace process along with bilateral effusions. Electronically Signed   By: Marijo Sanes M.D.   On: 08/11/2018 10:31   Dg Chest Port 1 View  Result Date:  08/10/2018 CLINICAL DATA:  Patient status post central line placement. EXAM: PORTABLE CHEST 1 VIEW COMPARISON:  Chest radiograph 08/10/2018 FINDINGS: Interval insertion right IJ approach pulmonary arterial catheter with tip projecting in the mid aspect of the right pulmonary artery. ET tube mid trachea. Enteric tube courses inferior to the diaphragm. Monitoring leads overlie the patient. Stable cardiac and mediastinal contours. Persistent diffuse bilateral airspace opacities. Small bilateral pleural effusions. IMPRESSION: Interval insertion PA catheter with  tip projecting at the mid right pulmonary artery. Re demonstrated bilateral consolidative opacities. Electronically Signed   By: Lovey Newcomer M.D.   On: 08/10/2018 13:22   Dg Chest Port 1 View  Result Date: 08/10/2018 CLINICAL DATA:  Respiratory failure EXAM: PORTABLE CHEST 1 VIEW COMPARISON:  08/09/2018 FINDINGS: Endotracheal tube is again seen and stable. Gastric catheter extends into the stomach. Cardiac shadow is enlarged but stable. Diffuse bilateral fluffy infiltrates are noted bilaterally stable from the prior exam. No bony abnormality noted. IMPRESSION: No change from the prior exam in bilateral infiltrates. Electronically Signed   By: Inez Catalina M.D.   On: 08/10/2018 02:45   Dg Chest Port 1 View  Result Date: 08/09/2018 CLINICAL DATA:  History of endotracheal tube EXAM: PORTABLE CHEST 1 VIEW COMPARISON:  Yesterday FINDINGS: Endotracheal tube tip between the clavicular heads and carina. The orogastric tube at least reaches the stomach. Multifocal airspace disease most consistent with pneumonia. No Kerley lines or cephalized blood flow seen in the best aerated left upper lobe. There is interval worsening at the right upper lobe. Borderline heart size. IMPRESSION: 1. Stable hardware positioning. 2. Worsening airspace disease especially in the right upper lobe. Asymmetric appearance is primarily concerning for pneumonia. Electronically Signed   By:  Monte Fantasia M.D.   On: 08/09/2018 09:14   Dg Chest Port 1 View  Result Date: 08/08/2018 CLINICAL DATA:  Intubation, respiratory failure EXAM: PORTABLE CHEST 1 VIEW COMPARISON:  Portable exam 0548 hours compared to 08/07/2018 FINDINGS: Tip of endotracheal tube projects 3.7 cm above carina. Nasogastric tube extends into stomach. Enlargement of cardiac silhouette. Diffuse BILATERAL airspace infiltrates improved RIGHT upper lobe since previous exam. Questionable bibasilar effusions. No pneumothorax. Bones demineralized. IMPRESSION: Diffuse BILATERAL pulmonary infiltrates improved in RIGHT upper lobe versus previous study. Electronically Signed   By: Lavonia Dana M.D.   On: 08/08/2018 08:48   Dg Chest Port 1 View  Result Date: 08/07/2018 CLINICAL DATA:  ETT and NG tube placement EXAM: PORTABLE CHEST 1 VIEW COMPARISON:  08/06/2018 FINDINGS: There has been interval placement of endotracheal tube, tip positioned at the carina, and esophagogastric tube with tip and side port below the diaphragm. There is slightly worsened dense, patchy heterogeneous bilateral pulmonary opacity and layering small pleural effusions. Cardiomegaly. IMPRESSION: 1. There has been interval placement of endotracheal tube, tip positioned at the carina, and esophagogastric tube with tip and side port below the diaphragm. Recommend slight retraction of endotracheal tube. 2. There is slightly worsened dense, patchy heterogeneous bilateral pulmonary opacity and layering small pleural effusions, consistent with severe edema, multifocal infection, and/or ARDS. Electronically Signed   By: Eddie Candle M.D.   On: 08/07/2018 10:31   Dg Chest Port 1 View  Result Date: 08/06/2018 CLINICAL DATA:  Increasing shortness of breath tonight EXAM: PORTABLE CHEST 1 VIEW COMPARISON:  Portable exam 2224 hours compared to 1328 hours FINDINGS: Enlargement of cardiac silhouette. Stable mediastinal contours. BILATERAL pulmonary infiltrates again identified,  somewhat patchy in appearance, question edema versus infection. Small LEFT pleural effusion. No pneumothorax. Bones demineralized. IMPRESSION: Patchy BILATERAL pulmonary infiltrates question pulmonary edema versus infection, infiltrates slightly increased in upper lobe since earlier study. Electronically Signed   By: Lavonia Dana M.D.   On: 08/06/2018 22:36   Dg Swallowing Func-speech Pathology  Result Date: 08/13/2018 Objective Swallowing Evaluation: Type of Study: MBS-Modified Barium Swallow Study  Patient Details Name: Laqueena Hinchey MRN: [id number] Date of Birth: 29-Sep-1950 Today's Date: 08/13/2018 Time: SLP Start Time (ACUTE ONLY): 1450 -SLP Stop  Time (ACUTE ONLY): 1514 SLP Time Calculation (min) (ACUTE ONLY): 24 min Past Medical History: Past Medical History: Diagnosis Date . Diabetes mellitus without complication ([place])  . Hypertension  Past Surgical History: Past Surgical History: Procedure Laterality Date . CORONARY STENT INTERVENTION N/A 08/10/2018  Procedure: CORONARY STENT INTERVENTION;  Surgeon: Leonie Man, MD;  Location: [facility];  Service: Cardiovascular;  Laterality: N/A; . RIGHT/LEFT HEART CATH AND CORONARY ANGIOGRAPHY N/A 08/10/2018  Procedure: RIGHT/LEFT HEART CATH AND CORONARY ANGIOGRAPHY;  Surgeon: Leonie Man, MD;  Location: [facility];  Service: Cardiovascular;  Laterality: N/A; HPI: Pt is a 68 yo female admitted 2/9 with NSTEMI. PEA arrest on 2/10. Pt was intubated on 2/10, self-extubated 2/14. Cardiac cath 2/13. PMH: CAD, HTN, DM, tobacco abuse.  CXR 2/14 concerning for bibasilar atelectasis or infiltrates.  Subjective: The patient was seen in radiology for MBS. Assessment / Plan / Recommendation CHL IP CLINICAL IMPRESSIONS 08/13/2018 Clinical Impression MBS was completed using thin liquids via spoon, nectar thick liquids via spoon and cup, honey thick liquids via cup and soft solids .  The patient presented with a mild oral and moderate pharyngeal dysphagia.    The  oral phase was marked by good lingual control but delayed a/p transport of the bolus particularly given solids.  The pharyngeal phase was marked by decreased hyolaryngeal movement, intermittently decreased epiglottic movement, decreased base of tongue retraction and decreased laryngeal vestibule closure.  These deficits led to vallecula residue.  In addition, aspiration prior to the swallow with a cough response that was ineffective to clear material from the airway was seen given thin liquids via spoon sips.  Aspiration into the airway with clearance of most of the material at completion of the swallow was seen prior to the swallow given nectar thick liquids.   Trace intermittent penetration into the laryngeal vestibule was seen given pureed material and soft solids.   Recommend downgrading diet to dysphagia 3 (mechanical soft) with honey thick liquids.  Oral care should be completed prior o PO intake to mitigate aspiration risk.  ST will follow for therapeutic diet tolerance and to initiate swallowng therapy.   SLP Visit Diagnosis Dysphagia, oropharyngeal phase (R13.12) Attention and concentration deficit following -- Frontal lobe and executive function deficit following -- Impact on safety and function Moderate aspiration risk   CHL IP TREATMENT RECOMMENDATION 08/13/2018 Treatment Recommendations Therapy as outlined in treatment plan below   Prognosis 08/13/2018 Prognosis for Safe Diet Advancement Good Barriers to Reach Goals -- Barriers/Prognosis Comment -- CHL IP DIET RECOMMENDATION 08/13/2018 SLP Diet Recommendations Honey thick liquids;Dysphagia 3 (Mech soft) solids Liquid Administration via Cup;No straw Medication Administration Crushed with puree Compensations Slow rate;Small sips/bites Postural Changes Seated upright at 90 degrees   CHL IP OTHER RECOMMENDATIONS 08/13/2018 Recommended Consults -- Oral Care Recommendations Oral care BID;Oral care before and after PO Other Recommendations Have oral suction  available   CHL IP FOLLOW UP RECOMMENDATIONS 08/13/2018 Follow up Recommendations Inpatient Rehab;Skilled Nursing facility;Home health SLP   CHL IP FREQUENCY AND DURATION 08/13/2018 Speech Therapy Frequency (ACUTE ONLY) min 2x/week Treatment Duration 2 weeks      CHL IP ORAL PHASE 08/13/2018 Oral Phase WFL Oral - Pudding Teaspoon -- Oral - Pudding Cup -- Oral - Honey Teaspoon -- Oral - Honey Cup -- Oral - Nectar Teaspoon -- Oral - Nectar Cup -- Oral - Nectar Straw -- Oral - Thin Teaspoon -- Oral - Thin Cup -- Oral - Thin Straw -- Oral - Puree --  Oral - Mech Soft -- Oral - Regular -- Oral - Multi-Consistency -- Oral - Pill -- Oral Phase - Comment --  CHL IP PHARYNGEAL PHASE 08/13/2018 Pharyngeal Phase Impaired Pharyngeal- Pudding Teaspoon -- Pharyngeal -- Pharyngeal- Pudding Cup -- Pharyngeal -- Pharyngeal- Honey Teaspoon -- Pharyngeal -- Pharyngeal- Honey Cup Pharyngeal residue - valleculae;Reduced tongue base retraction;Reduced laryngeal elevation Pharyngeal -- Pharyngeal- Nectar Teaspoon Reduced laryngeal elevation;Reduced tongue base retraction;Reduced airway/laryngeal closure;Penetration/Aspiration before swallow;Pharyngeal residue - valleculae Pharyngeal Material enters airway, passes BELOW cords then ejected out Pharyngeal- Nectar Cup Reduced laryngeal elevation;Reduced airway/laryngeal closure;Reduced tongue base retraction;Penetration/Aspiration before swallow;Pharyngeal residue - valleculae Pharyngeal Material enters airway, passes BELOW cords then ejected out Pharyngeal- Nectar Straw -- Pharyngeal -- Pharyngeal- Thin Teaspoon Penetration/Aspiration before swallow;Reduced epiglottic inversion;Reduced airway/laryngeal closure;Reduced anterior laryngeal mobility;Reduced laryngeal elevation Pharyngeal Material enters airway, passes BELOW cords and not ejected out despite cough attempt by patient Pharyngeal- Thin Cup -- Pharyngeal -- Pharyngeal- Thin Straw -- Pharyngeal -- Pharyngeal- Puree Penetration/Aspiration  during swallow;Reduced tongue base retraction;Pharyngeal residue - valleculae Pharyngeal Material enters airway, remains ABOVE vocal cords and not ejected out Pharyngeal- Mechanical Soft -- Pharyngeal -- Pharyngeal- Regular Reduced laryngeal elevation;Penetration/Aspiration during swallow;Reduced tongue base retraction;Pharyngeal residue - valleculae Pharyngeal Material enters airway, remains ABOVE vocal cords and not ejected out Pharyngeal- Multi-consistency -- Pharyngeal -- Pharyngeal- Pill -- Pharyngeal -- Pharyngeal Comment --  CHL IP CERVICAL ESOPHAGEAL PHASE 08/13/2018 Cervical Esophageal Phase WFL Pudding Teaspoon -- Pudding Cup -- Honey Teaspoon -- Honey Cup -- Nectar Teaspoon -- Nectar Cup -- Nectar Straw -- Thin Teaspoon -- Thin Cup -- Thin Straw -- Puree -- Mechanical Soft -- Regular -- Multi-consistency -- Pill -- Cervical Esophageal Comment -- Shelly Flatten, MA, CCC-SLP Acute Rehab SLP [phone] Lamar Sprinkles 08/13/2018, 3:35 PM              [place] Ekg Site Rite  Result Date: 08/12/2018 If Site Rite image not attached, placement could not be confirmed due to current cardiac rhythm.   Microbiology: No results found for this or any previous visit (from the past 240 hour(s)).   Labs: Basic Metabolic Panel: Recent Labs  Lab 08/13/18 0318  08/13/18 2142 08/14/18 0411 08/15/18 0401 08/16/18 0526 08/17/18 0343  NA  --    < > 138 137 140 137 137  K  --    < > 3.2* 5.0 2.9* 4.5 4.0  CL  --    < > 89* 90* 94* 94* 97*  CO2  --    < > 33* 34* 30 27 25   GLUCOSE  --    < > 99 101* 118* 181* 160*  BUN  --    < > 25* 24* 23 23 22   CREATININE  --    < > 1.55* 1.56* 1.62* 1.74* 1.74*  CALCIUM  --    < > 9.4 9.2 8.6* 8.9 8.8*  MG 1.9  --   --  1.9 1.8 2.3 2.1  PHOS 4.3  --   --  4.1 4.8* 4.6 4.6   < > = values in this interval not displayed.   Liver Function Tests: Recent Labs  Lab 08/16/18 0526  AST 18  ALT 17  ALKPHOS 97  BILITOT 1.2  PROT 6.6  ALBUMIN 2.0*   No results for  input(s): LIPASE, AMYLASE in the last 168 hours. No results for input(s): AMMONIA in the last 168 hours. CBC: Recent Labs  Lab 08/13/18 0318 08/14/18 0411 08/15/18 0401 08/16/18 0526 08/17/18 0343  WBC 10.8* 10.8*  11.7* 11.3* 11.5*  NEUTROABS 7.7 7.7 8.2* 7.9* 8.4*  HGB 11.7* 12.6 12.1 12.1 12.7  HCT 36.1 39.2 37.8 37.8 38.4  MCV 86.2 87.1 87.3 87.5 86.7  PLT 349 348 340 364 379   Cardiac Enzymes: No results for input(s): CKTOTAL, CKMB, CKMBINDEX, TROPONINI in the last 168 hours. BNP: BNP (last 3 results) Recent Labs    08/06/18 0628  BNP 1,535.2*    ProBNP (last 3 results) No results for input(s): PROBNP in the last 8760 hours.  CBG: Recent Labs  Lab 08/16/18 1153 08/16/18 1701 08/16/18 2157 08/17/18 0735 08/17/18 1139  GLUCAP 191* 191* 145* 154* 142*    Principal Problem:   NSTEMI (non-ST elevated myocardial infarction) (HCC) Active Problems:   Hypokalemia   Diabetes mellitus ([place])   NSTEMI, initial episode of care ([place])   Respiratory failure ([place])   Acute congestive heart failure ([place])   Time coordinating discharge: 38 minutes.  Signed:        Rochell Puett, DO [facility]  08/17/2018, 3:32 PM

## 2018-08-17 NOTE — Progress Notes (Addendum)
Patient ID: [name], female   DOB: 1950-10-27, 68 y.o.   MRN: [id number]     Advanced Heart Failure Rounding Note  PCP-Cardiologist: No primary care provider on file.   Subjective:    2/9 Admitted 2/9 with [place] and respiratory distress 2/10 PEA arrest 2/13 Underwent PCI of LAD 2/13 self-extubated 2/15 Milrinone stopped  Yesterday losartan was increased. Denies SOB.   RHC 08/10/18 LAD 80%---> DES LAD    RA 10  PA 60/28 (43) PCWP 29  CI 1.8   Echo 08/06/2018 - EF 25% , RV normal mild TR, and severe HK.   Objective:   Weight Range: 58.4 kg Body mass index is 23.54 kg/m.   Vital Signs:   Temp:  [97.4 F (36.3 C)-97.7 F (36.5 C)] 97.6 F (36.4 C) (02/20 0530) Pulse Rate:  [67-83] 67 (02/20 0530) Resp:  [20] 20 (02/20 0530) BP: (81-96)/(44-65) 96/51 (02/20 0530) SpO2:  [99 %-100 %] 99 % (02/20 0530) Weight:  [58.4 kg] 58.4 kg (02/20 0530) Last BM Date: 08/16/18  Weight change: Filed Weights   08/14/18 0420 08/15/18 0452 08/17/18 0530  Weight: 59.3 kg 58.6 kg 58.4 kg    Intake/Output:   Intake/Output Summary (Last 24 hours) at 08/17/2018 0709 Last data filed at 08/17/2018 0600 Gross per 24 hour  Intake 540 ml  Output 100 ml  Net 440 ml    Physical Exam   General:  No resp difficulty HEENT: normal Neck: supple. no JVD. Carotids 2+ bilat; no bruits. No lymphadenopathy or thryomegaly appreciated. Cor: PMI nondisplaced. Regular rate & rhythm. No rubs, gallops or murmurs. Lungs: Decreased LLL on 1 liter oxygen Abdomen: soft, nontender, nondistended. No hepatosplenomegaly. No bruits or masses. Good bowel sounds. Extremities: no cyanosis, clubbing, rash, edema Neuro: alert & orientedx3, cranial nerves grossly intact. moves all 4 extremities w/o difficulty. Affect pleasant   Telemetry   NSR  80-90s with occasional PVCs. Personally reviewed.    EKG    No new tracings.    Labs    CBC Recent Labs    08/16/18 0526 08/17/18 0343  WBC 11.3* 11.5*    NEUTROABS 7.9* 8.4*  HGB 12.1 12.7  HCT 37.8 38.4  MCV 87.5 86.7  PLT 364 517   Basic Metabolic Panel Recent Labs    08/16/18 0526 08/17/18 0343  NA 137 137  K 4.5 4.0  CL 94* 97*  CO2 27 25  GLUCOSE 181* 160*  BUN 23 22  CREATININE 1.74* 1.74*  CALCIUM 8.9 8.8*  MG 2.3 2.1  PHOS 4.6 4.6   Liver Function Tests Recent Labs    08/16/18 0526  AST 18  ALT 17  ALKPHOS 97  BILITOT 1.2  PROT 6.6  ALBUMIN 2.0*   No results for input(s): LIPASE, AMYLASE in the last 72 hours. Cardiac Enzymes No results for input(s): CKTOTAL, CKMB, CKMBINDEX, TROPONINI in the last 72 hours.  BNP: BNP (last 3 results) Recent Labs    08/06/18 0628  BNP 1,535.2*    ProBNP (last 3 results) No results for input(s): PROBNP in the last 8760 hours.   D-Dimer No results for input(s): DDIMER in the last 72 hours. Hemoglobin A1C No results for input(s): HGBA1C in the last 72 hours. Fasting Lipid Panel No results for input(s): CHOL, HDL, LDLCALC, TRIG, CHOLHDL, LDLDIRECT in the last 72 hours. Thyroid Function Tests No results for input(s): TSH, T4TOTAL, T3FREE, THYROIDAB in the last 72 hours.  Invalid input(s): FREET3  Other results:   Imaging  No results found.   Medications:     Scheduled Medications: . aspirin  81 mg Oral Daily  . atorvastatin  40 mg Oral q1800  . Chlorhexidine Gluconate Cloth  6 each Topical Daily  . digoxin  0.125 mg Oral Daily  . heparin injection (subcutaneous)  5,000 Units Subcutaneous Q8H  . insulin aspart  0-20 Units Subcutaneous TID WC  . insulin aspart  0-5 Units Subcutaneous QHS  . losartan  25 mg Oral QHS  . mouth rinse  15 mL Mouth Rinse BID  . multivitamin with minerals  1 tablet Oral Daily  . pantoprazole  40 mg Oral Daily  . senna  2 tablet Oral Daily  . sodium chloride flush  10-40 mL Intracatheter Q12H  . spironolactone  25 mg Oral Daily  . ticagrelor  90 mg Oral BID    Infusions:   PRN Medications: acetaminophen,  albuterol, ondansetron **OR** ondansetron (ZOFRAN) IV, RESOURCE THICKENUP CLEAR, sodium chloride flush, sodium chloride flush  Patient Profile   [name] is a 69 year old with a history of DMII, HTN, and smoker admitted with increased shortness of breath and chest pain. Troponin 7.5>6.5>6.3 and BNP 1535. CXR was concerning for pulmonary edema so IV lasix was given. Taken to the cath lab on 2/10 but this was aborted due to PEA arrest.  Assessment/Plan   1. Acute Hypoxic Respiratory Failure--> PEA arrest  - Likely due to pulmonary edema.  - CXR cleared slowly.  - Now on   2. PEA arrest on 2/10  - Due to hypoxia in setting of severe COPD and CHF - No further.   3. Acute systolic HR -> Cardiogenic Shock in the setting of NSTEMI/PEA arrest  - Suspect mixed ICM. NICM. S/p PCI  80% LAD on 12/15. ECHO 2/9 EF 20-25%  - Initial hemodynamics c/w shock. Resolved.   -Volume status stable. Does not need diuretics. .    -No room to titrate meds with soft bp.  - She remains on digoxin. Dig level 0.6. - Continue spironolactone 25 mg daily - Continue losartan 25 mg qhs.  - No room to titrate meds.    4. Pulmonary Hypertension  - Mostly pulmonary venous HTN.  PVR 4.7 No formal diagnosis of COPD but has long history of smoking.  - Now off milrinone.    4. NSTEMI  - peak troponin 7.5 - 08/10/18 LHC with DES to 80% mLAD -No chest pain.  -Continue  ticagrelor + ASA. No change.   5. DMII - SS per primary.  - Hgb A 1 C 10.2   6. Hypokalemia - Resolved.    Continue to mobilize. Encouraged to use IS.   Length of Stay: [place], NP  7:09 AM Advanced Heart Failure Team Pager [phone] (M-F; 7a - 4p)  Please contact [facility] for night-coverage after hours (4p -7a ) and weekends on [email or web address]  Patient seen and examined with the above-signed Advanced Practice Provider and/or Housestaff. I personally reviewed laboratory data, imaging studies and relevant notes. I independently  examined the patient and formulated the important aspects of the plan. I have edited the note to reflect any of my changes or salient points. I have personally discussed the plan with the patient and/or family.  Stable from HF and CAD perspective. May need SNF.   Stable for d/c from our standpoint on current meds. At d/c would giver her lasix 40mg  daily to use PRN for weight gain.   Glori Bickers, MD  7:21  AM

## 2018-08-22 ENCOUNTER — Telehealth (HOSPITAL_COMMUNITY): Payer: Self-pay

## 2018-08-22 DIAGNOSIS — Z794 Long term (current) use of insulin: Secondary | ICD-10-CM | POA: Diagnosis not present

## 2018-08-22 DIAGNOSIS — Z9861 Coronary angioplasty status: Secondary | ICD-10-CM | POA: Diagnosis not present

## 2018-08-22 DIAGNOSIS — Z7982 Long term (current) use of aspirin: Secondary | ICD-10-CM | POA: Diagnosis not present

## 2018-08-22 DIAGNOSIS — I110 Hypertensive heart disease with heart failure: Secondary | ICD-10-CM | POA: Diagnosis not present

## 2018-08-22 DIAGNOSIS — I509 Heart failure, unspecified: Secondary | ICD-10-CM | POA: Diagnosis not present

## 2018-08-22 DIAGNOSIS — I214 Non-ST elevation (NSTEMI) myocardial infarction: Secondary | ICD-10-CM | POA: Diagnosis not present

## 2018-08-22 DIAGNOSIS — I2510 Atherosclerotic heart disease of native coronary artery without angina pectoris: Secondary | ICD-10-CM | POA: Diagnosis not present

## 2018-08-22 DIAGNOSIS — J449 Chronic obstructive pulmonary disease, unspecified: Secondary | ICD-10-CM | POA: Diagnosis not present

## 2018-08-22 DIAGNOSIS — E119 Type 2 diabetes mellitus without complications: Secondary | ICD-10-CM | POA: Diagnosis not present

## 2018-08-22 NOTE — Telephone Encounter (Signed)
Attempted to call patient in regards to Cardiac Rehab - LM on VM °Gloria W. Support Rep II °

## 2018-08-22 NOTE — Telephone Encounter (Signed)
Pt insurance is active and benefits verified through [employer] $10.00, DED $0.00/0 met, out of pocket $3,400/$270 met, co-insurance 0%. no pre-authorization required. Passport, 08/21/2018 @ 4:18pm, REF# [id number]  Tedra Sen[place]. Support Rep II

## 2018-08-23 DIAGNOSIS — Z9861 Coronary angioplasty status: Secondary | ICD-10-CM | POA: Diagnosis not present

## 2018-08-23 DIAGNOSIS — J449 Chronic obstructive pulmonary disease, unspecified: Secondary | ICD-10-CM | POA: Diagnosis not present

## 2018-08-23 DIAGNOSIS — Z7982 Long term (current) use of aspirin: Secondary | ICD-10-CM | POA: Diagnosis not present

## 2018-08-23 DIAGNOSIS — Z794 Long term (current) use of insulin: Secondary | ICD-10-CM | POA: Diagnosis not present

## 2018-08-23 DIAGNOSIS — E119 Type 2 diabetes mellitus without complications: Secondary | ICD-10-CM | POA: Diagnosis not present

## 2018-08-23 DIAGNOSIS — I509 Heart failure, unspecified: Secondary | ICD-10-CM | POA: Diagnosis not present

## 2018-08-23 DIAGNOSIS — I2510 Atherosclerotic heart disease of native coronary artery without angina pectoris: Secondary | ICD-10-CM | POA: Diagnosis not present

## 2018-08-23 DIAGNOSIS — I214 Non-ST elevation (NSTEMI) myocardial infarction: Secondary | ICD-10-CM | POA: Diagnosis not present

## 2018-08-23 DIAGNOSIS — I110 Hypertensive heart disease with heart failure: Secondary | ICD-10-CM | POA: Diagnosis not present

## 2018-08-29 ENCOUNTER — Encounter (HOSPITAL_COMMUNITY): Payer: Self-pay

## 2018-08-29 ENCOUNTER — Ambulatory Visit (HOSPITAL_COMMUNITY)
Admit: 2018-08-29 | Discharge: 2018-08-29 | Disposition: A | Discharge status: Home / Self Care | Payer: Medicare HMO | Source: Ambulatory Visit | Attending: Cardiology | Admitting: Cardiology

## 2018-08-29 ENCOUNTER — Other Ambulatory Visit: Payer: Self-pay

## 2018-08-29 VITALS — BP 110/68 | Wt 125.6 lb

## 2018-08-29 DIAGNOSIS — I214 Non-ST elevation (NSTEMI) myocardial infarction: Secondary | ICD-10-CM

## 2018-08-29 DIAGNOSIS — E876 Hypokalemia: Secondary | ICD-10-CM | POA: Diagnosis not present

## 2018-08-29 DIAGNOSIS — I428 Other cardiomyopathies: Secondary | ICD-10-CM | POA: Diagnosis not present

## 2018-08-29 DIAGNOSIS — J449 Chronic obstructive pulmonary disease, unspecified: Secondary | ICD-10-CM | POA: Diagnosis not present

## 2018-08-29 DIAGNOSIS — R531 Weakness: Secondary | ICD-10-CM | POA: Diagnosis not present

## 2018-08-29 DIAGNOSIS — I272 Pulmonary hypertension, unspecified: Secondary | ICD-10-CM | POA: Diagnosis not present

## 2018-08-29 DIAGNOSIS — F17210 Nicotine dependence, cigarettes, uncomplicated: Secondary | ICD-10-CM | POA: Insufficient documentation

## 2018-08-29 DIAGNOSIS — Z794 Long term (current) use of insulin: Secondary | ICD-10-CM | POA: Diagnosis not present

## 2018-08-29 DIAGNOSIS — I469 Cardiac arrest, cause unspecified: Secondary | ICD-10-CM | POA: Insufficient documentation

## 2018-08-29 DIAGNOSIS — I2510 Atherosclerotic heart disease of native coronary artery without angina pectoris: Secondary | ICD-10-CM | POA: Insufficient documentation

## 2018-08-29 DIAGNOSIS — Z7902 Long term (current) use of antithrombotics/antiplatelets: Secondary | ICD-10-CM | POA: Diagnosis not present

## 2018-08-29 DIAGNOSIS — Z79899 Other long term (current) drug therapy: Secondary | ICD-10-CM | POA: Diagnosis not present

## 2018-08-29 DIAGNOSIS — I110 Hypertensive heart disease with heart failure: Secondary | ICD-10-CM | POA: Insufficient documentation

## 2018-08-29 DIAGNOSIS — Z7982 Long term (current) use of aspirin: Secondary | ICD-10-CM | POA: Insufficient documentation

## 2018-08-29 DIAGNOSIS — E1169 Type 2 diabetes mellitus with other specified complication: Secondary | ICD-10-CM | POA: Diagnosis not present

## 2018-08-29 DIAGNOSIS — I252 Old myocardial infarction: Secondary | ICD-10-CM | POA: Insufficient documentation

## 2018-08-29 DIAGNOSIS — E119 Type 2 diabetes mellitus without complications: Secondary | ICD-10-CM | POA: Diagnosis not present

## 2018-08-29 DIAGNOSIS — I5022 Chronic systolic (congestive) heart failure: Secondary | ICD-10-CM | POA: Insufficient documentation

## 2018-08-29 LAB — BASIC METABOLIC PANEL
Anion gap: 10 (ref 5–15)
BUN: 30 mg/dL — AB (ref 8–23)
CO2: 21 mmol/L — ABNORMAL LOW (ref 22–32)
CREATININE: 1.97 mg/dL — AB (ref 0.44–1.00)
Calcium: 9.3 mg/dL (ref 8.9–10.3)
Chloride: 107 mmol/L (ref 98–111)
GFR calc Af Amer: 29 mL/min — ABNORMAL LOW (ref >60)
GFR calc non Af Amer: 25 mL/min — ABNORMAL LOW (ref >60)
GLUCOSE: 266 mg/dL — AB (ref 70–99)
Potassium: 4.5 mmol/L (ref 3.5–5.1)
Sodium: 138 mmol/L (ref 135–145)

## 2018-08-29 LAB — DIGOXIN LEVEL: Digoxin Level: 1.3 ng/mL (ref 0.8–2.0)

## 2018-08-29 MED ORDER — CARVEDILOL 3.125 MG PO TABS: 3.1250 mg | ORAL_TABLET | Freq: Two times a day (BID) | ORAL | 3 refills | Status: DC

## 2018-08-29 NOTE — Patient Instructions (Signed)
Lab work was done today. We will call you with any abnormal lab work.  START Carvedilol 3.125mg  (1 TAB) twice daily  Follow up with the Advanced Practice Providers in 4 weeks.

## 2018-08-29 NOTE — Progress Notes (Signed)
Advanced Heart Failure Clinic Note   Referring Physician: PCP: [name], MD PCP-Cardiologist: No primary care provider on file.   HPI:  [name] is a 68 y.o. female  with a history of chronic systolic CHF, pulmonary venous HTN, COPD, CAD s/p NSTEMI and DES to LAD, DMII, HTN, and tobacco abuse.  Admitted 08/06/2018 with increased shortness of breath and chest pain. Troponin 7.5>6.5>6.3 and BNP 1535. CXR was concerning for pulmonary edema so IV lasix was given. Taken to the cath lab on 2/10 but this was aborted due to PEA arrest. CPR started and epi was given with ROSC in 6-8 minutes. Due to failure to protect airway she was intubated by PCCM. ECHO performed and showed reduced LVEF 20-25% and normal RV. She has been diuresing with IV lasix with sluggish urine output. Returned to the cath lab 08/10/2018 and had DES to LAD. RHC showed pulmonary htn and low output. Self-extubated 2/13 and weaned off milrinone 08/12/2018. HF medications adjusted as tolerated.   She presents today for post hospital follow up. She is feeling OK since discharge. Remains weak, but working with PT at home starting last week. She remains somewhat SOB with activity. Can do her ADLs for the most part, but occasional SOB washing up. She has been taking all of her medication as directed. Denies lightheadedness or dizziness. No CP or orthopnea. UOP has been stable.   EKG: NSR 96 bpmwith PVC, personally reviewed.   RHC 08/10/18  LAD 80%---> DES LAD    RA 10  PA 60/28 (43) PCWP 29   Echo 08/06/2018 - EF 25% , RV normal mild TR, and severe HK.   Review of systems complete and found to be negative unless listed in HPI.    Past Medical History:  Diagnosis Date  . Diabetes mellitus without complication ([place])   . Hypertension     Current Outpatient Medications  Medication Sig Dispense Refill  . aspirin 81 MG chewable tablet Chew 1 tablet (81 mg total) by mouth daily. 30 tablet 0  . atorvastatin (LIPITOR) 40 MG tablet  Take 1 tablet (40 mg total) by mouth daily at 6 PM. 30 tablet 0  . digoxin (LANOXIN) 0.125 MG tablet Take 1 tablet (0.125 mg total) by mouth daily. 30 tablet 0  . Insulin Glargine, 2 Unit Dial, (TOUJEO MAX SOLOSTAR) 300 UNIT/ML SOPN Inject 50 Units into the skin at bedtime.    Marland Kitchen losartan (COZAAR) 25 MG tablet Take 1 tablet (25 mg total) by mouth at bedtime. 30 tablet 0  . metFORMIN (GLUCOPHAGE) 1000 MG tablet Take 1,000 mg by mouth 2 (two) times daily with a meal.    . Multiple Vitamin (MULTIVITAMIN WITH MINERALS) TABS tablet Take 1 tablet by mouth daily. 30 tablet 0  . pantoprazole (PROTONIX) 40 MG tablet Take 1 tablet (40 mg total) by mouth daily. 30 tablet 0  . spironolactone (ALDACTONE) 25 MG tablet Take 1 tablet (25 mg total) by mouth daily. 30 tablet 0  . ticagrelor (BRILINTA) 90 MG TABS tablet Take 1 tablet (90 mg total) by mouth 2 (two) times daily. 60 tablet 0   No current facility-administered medications for this encounter.     No Known Allergies    Social History   Socioeconomic History  . Marital status: Married    Spouse name: Not on file  . Number of children: Not on file  . Years of education: Not on file  . Highest education level: Not on file  Occupational History  .  Not on file  Social Needs  . Financial resource strain: Not on file  . Food insecurity:    Worry: Not on file    Inability: Not on file  . Transportation needs:    Medical: Not on file    Non-medical: Not on file  Tobacco Use  . Smoking status: Current Every Day Smoker    Packs/day: 1.00  . Smokeless tobacco: Never Used  Substance and Sexual Activity  . Alcohol use: Yes    Frequency: Never    Comment: 2-3 drinks per week   . Drug use: Never  . Sexual activity: Not on file  Lifestyle  . Physical activity:    Days per week: Not on file    Minutes per session: Not on file  . Stress: Not on file  Relationships  . Social connections:    Talks on phone: Not on file    Gets together: Not on  file    Attends religious service: Not on file    Active member of club or organization: Not on file    Attends meetings of clubs or organizations: Not on file    Relationship status: Not on file  . Intimate partner violence:    Fear of current or ex partner: Not on file    Emotionally abused: Not on file    Physically abused: Not on file    Forced sexual activity: Not on file  Other Topics Concern  . Not on file  Social History Narrative  . Not on file    Family History  Problem Relation Age of Onset  . Breast cancer Neg Hx     Vitals:   08/29/18 1131  BP: 110/68  Weight: 57 kg (125 lb 9.6 oz)    Wt Readings from Last 3 Encounters:  08/29/18 57 kg (125 lb 9.6 oz)  08/17/18 58.4 kg (128 lb 11.2 oz)     PHYSICAL EXAM: General:  Well appearing. No respiratory difficulty HEENT: normal Neck: supple. JVP 6-7 cm. Carotids 2+ bilat; no bruits. No lymphadenopathy or thyromegaly appreciated. Cor: PMI nondisplaced. Regular rate & rhythm. No rubs, gallops or murmurs. Lungs: Diminished basilar sounds.  Abdomen: soft, nontender, nondistended. No hepatosplenomegaly. No bruits or masses. Good bowel sounds. Extremities: no cyanosis, clubbing, or rash. Trace ankle edema at most.  Neuro: alert & oriented x 3, cranial nerves grossly intact. moves all 4 extremities w/o difficulty. Affect pleasant.  EKG NSR 96 bpm, personally reviewed.   ASSESSMENT & PLAN:  1. Chronic systolic heart failure - Suspect mixed ICM. NICM. S/p PCI  80% LAD on 12/15.  - ECHO 2/9 EF 20-25%  - NYHA III symptoms currently.  - Volume status stable on exam.   - Continue lasix 40 mg daily. BMET today.  - Continue digoxin 0.125 mg daily. Check level today.  - Continue spironolactone 25 mg daily - Continue losartan 25 mg qhs.  - Will add low dose coreg 3.125 mg BID and follow closely.  - Reinforced fluid restriction to < 2 L daily, sodium restriction to less than 2000 mg daily, and the importance of daily weights.     2. CAD s/p NSTEMI  - 08/10/18 LHC with DES to 80% mLAD - No s/s of ischemia.    - Continue  ticagrelor + ASA. No change.   3. Pulmonary Hypertension  - Mostly pulmonary venous HTN.  PVR 4.7 No formal diagnosis of COPD but has long history of smoking.  - Now off milrinone.  4. COPD - Encouraged tobacco cessation.   5. DMII - Per PCP - Hgb A 1 C 10.2during recent admission - Consider SGLT2i.   6. Hypokalemia - Resolved.  BMET today.   Shirley Friar, PA-C 08/29/18   Greater than 50% of the 25 minute visit was spent in counseling/coordination of care regarding disease state education, salt/fluid restriction, sliding scale diuretics, and medication compliance.

## 2018-08-31 DIAGNOSIS — I110 Hypertensive heart disease with heart failure: Secondary | ICD-10-CM | POA: Diagnosis not present

## 2018-08-31 DIAGNOSIS — E119 Type 2 diabetes mellitus without complications: Secondary | ICD-10-CM | POA: Diagnosis not present

## 2018-08-31 DIAGNOSIS — I2510 Atherosclerotic heart disease of native coronary artery without angina pectoris: Secondary | ICD-10-CM | POA: Diagnosis not present

## 2018-08-31 DIAGNOSIS — I509 Heart failure, unspecified: Secondary | ICD-10-CM | POA: Diagnosis not present

## 2018-08-31 DIAGNOSIS — Z794 Long term (current) use of insulin: Secondary | ICD-10-CM | POA: Diagnosis not present

## 2018-08-31 DIAGNOSIS — Z7982 Long term (current) use of aspirin: Secondary | ICD-10-CM | POA: Diagnosis not present

## 2018-08-31 DIAGNOSIS — Z9861 Coronary angioplasty status: Secondary | ICD-10-CM | POA: Diagnosis not present

## 2018-08-31 DIAGNOSIS — I214 Non-ST elevation (NSTEMI) myocardial infarction: Secondary | ICD-10-CM | POA: Diagnosis not present

## 2018-08-31 DIAGNOSIS — J449 Chronic obstructive pulmonary disease, unspecified: Secondary | ICD-10-CM | POA: Diagnosis not present

## 2018-09-01 ENCOUNTER — Encounter (HOSPITAL_COMMUNITY): Payer: Self-pay | Admitting: Cardiology

## 2018-09-07 DIAGNOSIS — I2510 Atherosclerotic heart disease of native coronary artery without angina pectoris: Secondary | ICD-10-CM | POA: Diagnosis not present

## 2018-09-07 DIAGNOSIS — I110 Hypertensive heart disease with heart failure: Secondary | ICD-10-CM | POA: Diagnosis not present

## 2018-09-07 DIAGNOSIS — E119 Type 2 diabetes mellitus without complications: Secondary | ICD-10-CM | POA: Diagnosis not present

## 2018-09-07 DIAGNOSIS — Z7982 Long term (current) use of aspirin: Secondary | ICD-10-CM | POA: Diagnosis not present

## 2018-09-07 DIAGNOSIS — J449 Chronic obstructive pulmonary disease, unspecified: Secondary | ICD-10-CM | POA: Diagnosis not present

## 2018-09-07 DIAGNOSIS — I509 Heart failure, unspecified: Secondary | ICD-10-CM | POA: Diagnosis not present

## 2018-09-07 DIAGNOSIS — I214 Non-ST elevation (NSTEMI) myocardial infarction: Secondary | ICD-10-CM | POA: Diagnosis not present

## 2018-09-07 DIAGNOSIS — Z9861 Coronary angioplasty status: Secondary | ICD-10-CM | POA: Diagnosis not present

## 2018-09-07 DIAGNOSIS — Z794 Long term (current) use of insulin: Secondary | ICD-10-CM | POA: Diagnosis not present

## 2018-09-11 DIAGNOSIS — I10 Essential (primary) hypertension: Secondary | ICD-10-CM | POA: Diagnosis not present

## 2018-09-11 DIAGNOSIS — Z87891 Personal history of nicotine dependence: Secondary | ICD-10-CM | POA: Diagnosis not present

## 2018-09-11 DIAGNOSIS — F17210 Nicotine dependence, cigarettes, uncomplicated: Secondary | ICD-10-CM | POA: Diagnosis not present

## 2018-09-11 DIAGNOSIS — E119 Type 2 diabetes mellitus without complications: Secondary | ICD-10-CM | POA: Diagnosis not present

## 2018-09-12 DIAGNOSIS — I214 Non-ST elevation (NSTEMI) myocardial infarction: Secondary | ICD-10-CM | POA: Diagnosis not present

## 2018-09-12 DIAGNOSIS — Z794 Long term (current) use of insulin: Secondary | ICD-10-CM | POA: Diagnosis not present

## 2018-09-12 DIAGNOSIS — Z9861 Coronary angioplasty status: Secondary | ICD-10-CM | POA: Diagnosis not present

## 2018-09-12 DIAGNOSIS — E119 Type 2 diabetes mellitus without complications: Secondary | ICD-10-CM | POA: Diagnosis not present

## 2018-09-12 DIAGNOSIS — Z7982 Long term (current) use of aspirin: Secondary | ICD-10-CM | POA: Diagnosis not present

## 2018-09-12 DIAGNOSIS — J449 Chronic obstructive pulmonary disease, unspecified: Secondary | ICD-10-CM | POA: Diagnosis not present

## 2018-09-12 DIAGNOSIS — I110 Hypertensive heart disease with heart failure: Secondary | ICD-10-CM | POA: Diagnosis not present

## 2018-09-12 DIAGNOSIS — I2510 Atherosclerotic heart disease of native coronary artery without angina pectoris: Secondary | ICD-10-CM | POA: Diagnosis not present

## 2018-09-12 DIAGNOSIS — I509 Heart failure, unspecified: Secondary | ICD-10-CM | POA: Diagnosis not present

## 2018-09-15 DIAGNOSIS — I110 Hypertensive heart disease with heart failure: Secondary | ICD-10-CM | POA: Diagnosis not present

## 2018-09-15 DIAGNOSIS — I214 Non-ST elevation (NSTEMI) myocardial infarction: Secondary | ICD-10-CM | POA: Diagnosis not present

## 2018-09-15 DIAGNOSIS — I2510 Atherosclerotic heart disease of native coronary artery without angina pectoris: Secondary | ICD-10-CM | POA: Diagnosis not present

## 2018-09-15 DIAGNOSIS — Z7982 Long term (current) use of aspirin: Secondary | ICD-10-CM | POA: Diagnosis not present

## 2018-09-15 DIAGNOSIS — Z9861 Coronary angioplasty status: Secondary | ICD-10-CM | POA: Diagnosis not present

## 2018-09-15 DIAGNOSIS — I509 Heart failure, unspecified: Secondary | ICD-10-CM | POA: Diagnosis not present

## 2018-09-15 DIAGNOSIS — J449 Chronic obstructive pulmonary disease, unspecified: Secondary | ICD-10-CM | POA: Diagnosis not present

## 2018-09-15 DIAGNOSIS — Z794 Long term (current) use of insulin: Secondary | ICD-10-CM | POA: Diagnosis not present

## 2018-09-15 DIAGNOSIS — E119 Type 2 diabetes mellitus without complications: Secondary | ICD-10-CM | POA: Diagnosis not present

## 2018-09-16 ENCOUNTER — Emergency Department (HOSPITAL_COMMUNITY): Payer: Medicare HMO

## 2018-09-16 ENCOUNTER — Other Ambulatory Visit: Payer: Self-pay

## 2018-09-16 ENCOUNTER — Encounter (HOSPITAL_COMMUNITY): Payer: Self-pay | Admitting: Emergency Medicine

## 2018-09-16 ENCOUNTER — Inpatient Hospital Stay (HOSPITAL_COMMUNITY)
Admission: EM | Admit: 2018-09-16 | Discharge: 2018-09-19 | DRG: 291 | Disposition: A | Discharge status: Home Health | Payer: Medicare HMO | Attending: Internal Medicine | Admitting: Internal Medicine

## 2018-09-16 DIAGNOSIS — E440 Moderate protein-calorie malnutrition: Secondary | ICD-10-CM | POA: Diagnosis not present

## 2018-09-16 DIAGNOSIS — R0682 Tachypnea, not elsewhere classified: Secondary | ICD-10-CM | POA: Diagnosis not present

## 2018-09-16 DIAGNOSIS — E119 Type 2 diabetes mellitus without complications: Secondary | ICD-10-CM | POA: Diagnosis not present

## 2018-09-16 DIAGNOSIS — R0902 Hypoxemia: Secondary | ICD-10-CM | POA: Diagnosis not present

## 2018-09-16 DIAGNOSIS — Z794 Long term (current) use of insulin: Secondary | ICD-10-CM

## 2018-09-16 DIAGNOSIS — N183 Chronic kidney disease, stage 3 unspecified: Secondary | ICD-10-CM | POA: Diagnosis present

## 2018-09-16 DIAGNOSIS — E876 Hypokalemia: Secondary | ICD-10-CM | POA: Diagnosis present

## 2018-09-16 DIAGNOSIS — I2510 Atherosclerotic heart disease of native coronary artery without angina pectoris: Secondary | ICD-10-CM | POA: Diagnosis present

## 2018-09-16 DIAGNOSIS — I110 Hypertensive heart disease with heart failure: Secondary | ICD-10-CM | POA: Diagnosis not present

## 2018-09-16 DIAGNOSIS — Z7982 Long term (current) use of aspirin: Secondary | ICD-10-CM

## 2018-09-16 DIAGNOSIS — E11649 Type 2 diabetes mellitus with hypoglycemia without coma: Secondary | ICD-10-CM | POA: Diagnosis present

## 2018-09-16 DIAGNOSIS — Z9861 Coronary angioplasty status: Secondary | ICD-10-CM

## 2018-09-16 DIAGNOSIS — J9621 Acute and chronic respiratory failure with hypoxia: Secondary | ICD-10-CM | POA: Diagnosis not present

## 2018-09-16 DIAGNOSIS — I5043 Acute on chronic combined systolic (congestive) and diastolic (congestive) heart failure: Secondary | ICD-10-CM | POA: Diagnosis not present

## 2018-09-16 DIAGNOSIS — I130 Hypertensive heart and chronic kidney disease with heart failure and stage 1 through stage 4 chronic kidney disease, or unspecified chronic kidney disease: Secondary | ICD-10-CM | POA: Diagnosis not present

## 2018-09-16 DIAGNOSIS — Z8674 Personal history of sudden cardiac arrest: Secondary | ICD-10-CM

## 2018-09-16 DIAGNOSIS — I509 Heart failure, unspecified: Secondary | ICD-10-CM | POA: Diagnosis not present

## 2018-09-16 DIAGNOSIS — I252 Old myocardial infarction: Secondary | ICD-10-CM | POA: Diagnosis not present

## 2018-09-16 DIAGNOSIS — J449 Chronic obstructive pulmonary disease, unspecified: Secondary | ICD-10-CM | POA: Diagnosis present

## 2018-09-16 DIAGNOSIS — F17200 Nicotine dependence, unspecified, uncomplicated: Secondary | ICD-10-CM | POA: Diagnosis not present

## 2018-09-16 DIAGNOSIS — E1122 Type 2 diabetes mellitus with diabetic chronic kidney disease: Secondary | ICD-10-CM

## 2018-09-16 DIAGNOSIS — Z79899 Other long term (current) drug therapy: Secondary | ICD-10-CM | POA: Diagnosis not present

## 2018-09-16 DIAGNOSIS — I5023 Acute on chronic systolic (congestive) heart failure: Secondary | ICD-10-CM | POA: Diagnosis not present

## 2018-09-16 DIAGNOSIS — T502X5A Adverse effect of carbonic-anhydrase inhibitors, benzothiadiazides and other diuretics, initial encounter: Secondary | ICD-10-CM | POA: Diagnosis present

## 2018-09-16 DIAGNOSIS — I214 Non-ST elevation (NSTEMI) myocardial infarction: Secondary | ICD-10-CM | POA: Diagnosis not present

## 2018-09-16 DIAGNOSIS — E1165 Type 2 diabetes mellitus with hyperglycemia: Secondary | ICD-10-CM | POA: Diagnosis not present

## 2018-09-16 DIAGNOSIS — R000 Tachycardia, unspecified: Secondary | ICD-10-CM | POA: Diagnosis not present

## 2018-09-16 DIAGNOSIS — R0602 Shortness of breath: Secondary | ICD-10-CM | POA: Diagnosis not present

## 2018-09-16 HISTORY — DX: Chronic obstructive pulmonary disease, unspecified: J44.9

## 2018-09-16 HISTORY — DX: Non-ST elevation (NSTEMI) myocardial infarction: I21.4

## 2018-09-16 LAB — COMPREHENSIVE METABOLIC PANEL
ALK PHOS: 88 U/L (ref 38–126)
ALT: 21 U/L (ref 0–44)
AST: 14 U/L — ABNORMAL LOW (ref 15–41)
Albumin: 3.3 g/dL — ABNORMAL LOW (ref 3.5–5.0)
Anion gap: 12 (ref 5–15)
BILIRUBIN TOTAL: 0.8 mg/dL (ref 0.3–1.2)
BUN: 23 mg/dL (ref 8–23)
CALCIUM: 9.6 mg/dL (ref 8.9–10.3)
CO2: 16 mmol/L — ABNORMAL LOW (ref 22–32)
Chloride: 106 mmol/L (ref 98–111)
Creatinine, Ser: 1.96 mg/dL — ABNORMAL HIGH (ref 0.44–1.00)
GFR calc Af Amer: 30 mL/min — ABNORMAL LOW (ref >60)
GFR calc non Af Amer: 25 mL/min — ABNORMAL LOW (ref >60)
Glucose, Bld: 266 mg/dL — ABNORMAL HIGH (ref 70–99)
Potassium: 5 mmol/L (ref 3.5–5.1)
Sodium: 134 mmol/L — ABNORMAL LOW (ref 135–145)
TOTAL PROTEIN: 7.9 g/dL (ref 6.5–8.1)

## 2018-09-16 LAB — CBC WITH DIFFERENTIAL/PLATELET
Abs Immature Granulocytes: 0.03 10*3/uL (ref 0.00–0.07)
Basophils Absolute: 0 10*3/uL (ref 0.0–0.1)
Basophils Relative: 1 %
Eosinophils Absolute: 0.1 10*3/uL (ref 0.0–0.5)
Eosinophils Relative: 2 %
HCT: 42.1 % (ref 36.0–46.0)
Hemoglobin: 13.4 g/dL (ref 12.0–15.0)
Immature Granulocytes: 0 %
Lymphocytes Relative: 29 %
Lymphs Abs: 2 10*3/uL (ref 0.7–4.0)
MCH: 27.8 pg (ref 26.0–34.0)
MCHC: 31.8 g/dL (ref 30.0–36.0)
MCV: 87.3 fL (ref 80.0–100.0)
MONOS PCT: 6 %
Monocytes Absolute: 0.4 10*3/uL (ref 0.1–1.0)
Neutro Abs: 4.2 10*3/uL (ref 1.7–7.7)
Neutrophils Relative %: 62 %
Platelets: 307 10*3/uL (ref 150–400)
RBC: 4.82 MIL/uL (ref 3.87–5.11)
RDW: 14.6 % (ref 11.5–15.5)
WBC: 6.8 10*3/uL (ref 4.0–10.5)
nRBC: 0 % (ref 0.0–0.2)

## 2018-09-16 LAB — LACTIC ACID, PLASMA
Lactic Acid, Venous: 2.3 mmol/L (ref 0.5–1.9)
Lactic Acid, Venous: 2.4 mmol/L (ref 0.5–1.9)

## 2018-09-16 LAB — GLUCOSE, CAPILLARY
Glucose-Capillary: 134 mg/dL — ABNORMAL HIGH (ref 70–99)
Glucose-Capillary: 145 mg/dL — ABNORMAL HIGH (ref 70–99)
Glucose-Capillary: 148 mg/dL — ABNORMAL HIGH (ref 70–99)

## 2018-09-16 LAB — TROPONIN I
Troponin I: 0.03 ng/mL (ref <0.03)
Troponin I: 0.04 ng/mL (ref <0.03)

## 2018-09-16 LAB — BRAIN NATRIURETIC PEPTIDE: B NATRIURETIC PEPTIDE 5: 1254.7 pg/mL — AB (ref 0.0–100.0)

## 2018-09-16 MED ORDER — FUROSEMIDE 10 MG/ML IJ SOLN
20.0000 mg | Freq: Once | INTRAMUSCULAR | Status: AC
  Administered 2018-09-16: 20 mg via INTRAVENOUS
  Filled 2018-09-16: qty 2

## 2018-09-16 MED ORDER — FUROSEMIDE 10 MG/ML IJ SOLN
40.0000 mg | Freq: Two times a day (BID) | INTRAMUSCULAR | Status: DC
  Administered 2018-09-16 – 2018-09-18 (×5): 40 mg via INTRAVENOUS
  Filled 2018-09-16 (×5): qty 4

## 2018-09-16 MED ORDER — DIGOXIN 125 MCG PO TABS: 0.1250 mg | ORAL_TABLET | Freq: Every day | ORAL | Status: DC

## 2018-09-16 MED ORDER — PANTOPRAZOLE SODIUM 40 MG PO TBEC
40.0000 mg | DELAYED_RELEASE_TABLET | Freq: Every day | ORAL | Status: DC
  Administered 2018-09-16 – 2018-09-19 (×4): 40 mg via ORAL
  Filled 2018-09-16 (×4): qty 1

## 2018-09-16 MED ORDER — SPIRONOLACTONE 25 MG PO TABS
25.0000 mg | ORAL_TABLET | Freq: Every day | ORAL | Status: DC
  Administered 2018-09-16 – 2018-09-18 (×3): 25 mg via ORAL
  Filled 2018-09-16 (×3): qty 1

## 2018-09-16 MED ORDER — SODIUM CHLORIDE 0.9% FLUSH: 3.0000 mL | INTRAVENOUS | Status: DC | PRN

## 2018-09-16 MED ORDER — TICAGRELOR 90 MG PO TABS
90.0000 mg | ORAL_TABLET | Freq: Two times a day (BID) | ORAL | Status: DC
  Administered 2018-09-16 – 2018-09-19 (×7): 90 mg via ORAL
  Filled 2018-09-16 (×7): qty 1

## 2018-09-16 MED ORDER — CARVEDILOL 3.125 MG PO TABS
3.1250 mg | ORAL_TABLET | Freq: Two times a day (BID) | ORAL | Status: DC
  Administered 2018-09-16 – 2018-09-19 (×7): 3.125 mg via ORAL
  Filled 2018-09-16 (×7): qty 1

## 2018-09-16 MED ORDER — INSULIN GLARGINE 100 UNIT/ML ~~LOC~~ SOLN
50.0000 [IU] | Freq: Every day | SUBCUTANEOUS | Status: DC
  Administered 2018-09-16: 50 [IU] via SUBCUTANEOUS
  Filled 2018-09-16: qty 0.5

## 2018-09-16 MED ORDER — SODIUM CHLORIDE 0.9 % IV SOLN: 250.0000 mL | INTRAVENOUS | Status: DC | PRN

## 2018-09-16 MED ORDER — ONDANSETRON HCL 4 MG PO TABS: 4.0000 mg | ORAL_TABLET | Freq: Four times a day (QID) | ORAL | Status: DC | PRN

## 2018-09-16 MED ORDER — ACETAMINOPHEN 650 MG RE SUPP: 650.0000 mg | Freq: Four times a day (QID) | RECTAL | Status: DC | PRN

## 2018-09-16 MED ORDER — ACETAMINOPHEN 325 MG PO TABS: 650.0000 mg | ORAL_TABLET | Freq: Four times a day (QID) | ORAL | Status: DC | PRN

## 2018-09-16 MED ORDER — SODIUM CHLORIDE 0.9% FLUSH
3.0000 mL | Freq: Two times a day (BID) | INTRAVENOUS | Status: DC
  Administered 2018-09-16 – 2018-09-18 (×3): 3 mL via INTRAVENOUS

## 2018-09-16 MED ORDER — INSULIN ASPART 100 UNIT/ML ~~LOC~~ SOLN
0.0000 [IU] | Freq: Three times a day (TID) | SUBCUTANEOUS | Status: DC
  Administered 2018-09-16 – 2018-09-18 (×3): 1 [IU] via SUBCUTANEOUS
  Administered 2018-09-19: 2 [IU] via SUBCUTANEOUS
  Administered 2018-09-19: 1 [IU] via SUBCUTANEOUS

## 2018-09-16 MED ORDER — ASPIRIN 81 MG PO CHEW
81.0000 mg | CHEWABLE_TABLET | Freq: Every day | ORAL | Status: DC
  Administered 2018-09-16 – 2018-09-19 (×4): 81 mg via ORAL
  Filled 2018-09-16 (×4): qty 1

## 2018-09-16 MED ORDER — ONDANSETRON HCL 4 MG/2ML IJ SOLN: 4.0000 mg | Freq: Four times a day (QID) | INTRAMUSCULAR | Status: DC | PRN

## 2018-09-16 MED ORDER — INSULIN ASPART 100 UNIT/ML ~~LOC~~ SOLN: 0.0000 [IU] | Freq: Every day | SUBCUTANEOUS | Status: DC

## 2018-09-16 MED ORDER — SODIUM CHLORIDE 0.9% FLUSH
3.0000 mL | Freq: Two times a day (BID) | INTRAVENOUS | Status: DC
  Administered 2018-09-16 – 2018-09-18 (×4): 3 mL via INTRAVENOUS

## 2018-09-16 MED ORDER — ENOXAPARIN SODIUM 30 MG/0.3ML ~~LOC~~ SOLN
30.0000 mg | Freq: Every day | SUBCUTANEOUS | Status: DC
  Administered 2018-09-16 – 2018-09-18 (×3): 30 mg via SUBCUTANEOUS
  Filled 2018-09-16 (×3): qty 0.3

## 2018-09-16 MED ORDER — ATORVASTATIN CALCIUM 40 MG PO TABS
40.0000 mg | ORAL_TABLET | Freq: Every day | ORAL | Status: DC
  Administered 2018-09-16 – 2018-09-18 (×3): 40 mg via ORAL
  Filled 2018-09-16 (×3): qty 1

## 2018-09-16 NOTE — ED Notes (Signed)
The pt appears to be comforftgable with less resp discomfort

## 2018-09-16 NOTE — ED Notes (Signed)
Report called  

## 2018-09-16 NOTE — Plan of Care (Signed)
Care plans reviewed and patient is progressing.  

## 2018-09-16 NOTE — H&P (Signed)
History and Physical  [name] TKP:[id number] DOB: Apr 01, 1951 DOA: 09/16/2018  PCP: [name], MD   Chief Complaint: SOB  HPI:  [id number] PMH recent PEA arrest, NSTEMI, CHF exacerbation; known CAD, severe COPD presented with SOB, acute hypoxic resp failure, orthopnea, no ever or infectious symptoms.Tx with BiPAP. Not on Lasix at facilty. Working dx on admission a/c systolic CHF.  Patient was recently hospitalized from 2/9-2/20 due to non-STEMI, cardiogenic shock, PEA, and underwent PCI, started Brilinta  Reports doing well until sudden sob last night, chest pressure, worse with laying, better when up. No associated n/v. Otherwise no complaints.  Feels better now, breathing fine on [place]. No chest pain.  COVID SCREEN Fever: NO  Cough: NO   SOB: YES URI symptoms: NO GI symptoms: NO Travel: NO Sick contacts: NO  ED Course: BiPAP, lasix   Review of Systems:  Negative for fever, visual changes, sore throat, rash, new muscle aches, dysuria, bleeding, n/v/abdominal pain.  Past Medical History:  Diagnosis Date  . COPD (chronic obstructive pulmonary disease) ([place])   . Diabetes mellitus without complication ([place])   . Hypertension   . NSTEMI (non-ST elevated myocardial infarction) [facility])     Past Surgical History:  Procedure Laterality Date  . CORONARY STENT INTERVENTION N/A 08/10/2018   Procedure: CORONARY STENT INTERVENTION;  Surgeon: Leonie Man, MD;  Location: [place] [facility];  Service: Cardiovascular;  Laterality: N/A;  . RIGHT/LEFT HEART CATH AND CORONARY ANGIOGRAPHY N/A 08/10/2018   Procedure: RIGHT/LEFT HEART CATH AND CORONARY ANGIOGRAPHY;  Surgeon: Leonie Man, MD;  Location: [place] [facility];  Service: Cardiovascular;  Laterality: N/A;     reports that she has been smoking. She has been smoking about 1.00 pack per day. She has never used smokeless tobacco. She reports current alcohol use. She reports that she does not use drugs.   No Known Allergies   Family History  Problem Relation Age of Onset  . Breast cancer Neg Hx      Prior to Admission medications   Medication Sig Start Date End Date Taking? Authorizing Provider  aspirin 81 MG chewable tablet Chew 1 tablet (81 mg total) by mouth daily. 08/18/18   Swayze, Ava, DO  atorvastatin (LIPITOR) 40 MG tablet Take 1 tablet (40 mg total) by mouth daily at 6 PM. 08/17/18   Swayze, Ava, DO  carvedilol (COREG) 3.125 MG tablet Take 1 tablet (3.125 mg total) by mouth 2 (two) times daily. 08/29/18 11/27/18  Shirley Friar, PA-C  digoxin (LANOXIN) 0.125 MG tablet Take 1 tablet (0.125 mg total) by mouth daily. 08/18/18   Swayze, Ava, DO  Insulin Glargine, 2 Unit Dial, (TOUJEO MAX SOLOSTAR) 300 UNIT/ML SOPN Inject 50 Units into the skin at bedtime.    [provider]  losartan (COZAAR) 25 MG tablet Take 1 tablet (25 mg total) by mouth at bedtime. 08/17/18   Swayze, Ava, DO  metFORMIN (GLUCOPHAGE) 1000 MG tablet Take 1,000 mg by mouth 2 (two) times daily with a meal.    [provider]  Multiple Vitamin (MULTIVITAMIN WITH MINERALS) TABS tablet Take 1 tablet by mouth daily. 08/18/18   Swayze, Ava, DO  pantoprazole (PROTONIX) 40 MG tablet Take 1 tablet (40 mg total) by mouth daily. 08/18/18   Swayze, Ava, DO  spironolactone (ALDACTONE) 25 MG tablet Take 1 tablet (25 mg total) by mouth daily. 08/18/18   Swayze, Ava, DO  ticagrelor (BRILINTA) 90 MG TABS tablet Take 1 tablet (90 mg total) by mouth 2 (two)  times daily. 08/17/18   Swayze, Ava, DO    Physical Exam: Vitals:   09/16/18 0730 09/16/18 0800  BP: 102/72 112/77  Pulse: 74   Resp: (!) 23   Temp:  97.9 F (36.6 C)  SpO2: 100%     Constitutional:   . Appears calm and comfortable on [place] Eyes:  . pupils and irises appear normal . Normal lids  ENMT:  . grossly normal hearing  . Lips appear normal Neck:  . neck appears normal, no masses . no thyromegaly Respiratory:  . CTA bilaterally, no w/r/r.  . Respiratory effort  normal. Cardiovascular:  . RRR, no m/r/g . No LE extremity edema   Abdomen:  . softntnd Musculoskeletal:  . Digits/nails BUE: no clubbing, cyanosis, petechiae, infection . RUE, LUE, RLE, LLE   o strength and tone normal, no atrophy, no abnormal movements o No tenderness, masses Skin:  . No rashes, lesions, ulcers . palpation of skin: no induration or nodules Psychiatric:  . Mental status o Mood, affect appropriate . judgment and insight appear intact    I have personally reviewed following labs and imaging studies  Labs:   CBG stable  CMP unremarkable  BNO 1254  torponin .03  LA 2.4  CBC normal    Imaging studies:   CXR bilateral patchy infiltrate, same as previous, clinically CHF   Medical tests:   EKG independently reviewed: ST, inferior Q consider inferior MI, age unknown    Active Problems:   Diabetes mellitus ([place])   Acute on chronic systolic (congestive) heart failure (HCC)   Acute on chronic respiratory failure with hypoxia (HCC)   CKD (chronic kidney disease), stage III (HCC)   CAD (coronary artery disease), native coronary artery   Assessment/Plan Acute hypox resp failure, no on O2 at home --secondary to a/c CHF --wean oxygen  A/c systolic CHF, known CAD, recent NSTEMI, cardiogenic shock, s/p PEA, s/p PCI --BNP high, troponin minimal elevation --complaint with diet --suspect simple acute chf, but will r/o MI; no evidence of ACS --trend troponin, continue Lasix --resume meds Lipitor, ASA, Coreg, aldactone, Brilinta but hold losartan --will hold dig given renal insuff--please review 3/22 whether this should be restarted  CKD stage III --stable, follow BMP with IV diuresis  Elevated lactate secondary to resp distress on presentation, no s/s of infection --will follow clinically only  DM type 2 --SSI  COVID likelihood: low Physician PPE: glove, surg mask Patient PPE: none     Severity of Illness: The appropriate patient status for  this patient is INPATIENT. Inpatient status is judged to be reasonable and necessary in order to provide the required intensity of service to ensure the patient's safety. The patient's presenting symptoms, physical exam findings, and initial radiographic and laboratory data in the context of their chronic comorbidities is felt to place them at high risk for further clinical deterioration. Furthermore, it is not anticipated that the patient will be medically stable for discharge from the hospital within 2 midnights of admission. The following factors support the patient status of inpatient.   " The patient's presenting symptoms include SOB. " The worrisome physical exam findings include SOB. " The initial radiographic and laboratory data are worrisome because of pulmonary edema. " The chronic co-morbidities include CHF, NSTEMI, DM.   * I certify that at the point of admission it is my clinical judgment that the patient will require inpatient hospital care spanning beyond 2 midnights from the point of admission due to high intensity of service, high  risk for further deterioration and high frequency of surveillance required.*   DVT prophylaxis:enoxaparin Code Status: Full Family Communication: wife at bedside Consults called: none. Cardiology requests minimize consults/save PPE. No need for cardiology involvement currently    Time spent: 60 minutes  Murray Hodgkins, MD  [facility] Direct contact: see [email or web address]  7PM-7AM contact night coverage as below   1. Check the care team in [facility] and look for a) attending/consulting TR[facility] provider listed and b) the [facility] team listed 2. Log into [email or web address] and use [facility] universal password to access. If you do not have the password, please contact the hospital operator. 3. Locate the [facility] provider you are looking for under [facility] and page to a number that you can be directly reached. 4. If you still have difficulty reaching the  provider, please page the [facility] (Director on Call) for the Hospitalists listed on amion for assistance.   09/16/2018, 11:11 AM

## 2018-09-16 NOTE — Care Management (Signed)
This is a no charge note  Pending admission per Dr. Myrene Buddy  68 year old lady with past medical history of hypertension, hyperlipidemia, diabetes mellitus, GERD, sCHF with EF 20%, who presents with respiratory distress, with oxygen desaturation to high 70s.  Patient was recently hospitalized from 2/9-2/20 due to non-STEMI, cardiogenic shock, PEA, and underwent PCI, started Brilinta.  Patient was found to have BNP 1254, troponin 0.03, no chest pain, lactic acid 2.3, slightly worsening renal function, temperature normal, heart rate 80s, oxygen saturation 94% on BiPAP.  Clinically consistent with CHF exacerbation.  Patient is admitted to stepdown bed as inpatient.   Ivor Costa, MD  [facility]   If 7PM-7AM, please contact night-coverage [email or web address] Password [facility] 09/16/2018, 6:40 AM

## 2018-09-16 NOTE — ED Notes (Signed)
Lasix given 

## 2018-09-16 NOTE — ED Provider Notes (Signed)
[place] EMERGENCY DEPARTMENT Provider Note   CSN: [id number] Arrival date & time: 09/16/18  0320    History   Chief Complaint Chief Complaint  Patient presents with  . Shortness of Breath    HPI [name] is a 68 y.o. female.     HPI 68 year old female with past medical history of recent PEA arrest secondary to hypoxemia, CHF exacerbation, and coronary artery disease, severe COPD, here with shortness of breath.  States that she has been feeling generally unwell for the last several days.  She is had worsening shortness of breath, worse with lying flat.  She is adamant she has not had any fever or infectious symptoms.  She has been taking her medications as prescribed.  She began to feel more short of breath today, so she called EMS.  On MS arrival, she was significantly hypoxic.  She was placed on BiPAP with improvement in her work of breathing.  Lungs also sound significantly better.  She denies any other acute complaints.  Denies any current chest pain.  She does admit she feels better on BiPAP.  Past Medical History:  Diagnosis Date  . Diabetes mellitus without complication ([place])   . Hypertension     Patient Active Problem List   Diagnosis Date Noted  . Acute on chronic systolic (congestive) heart failure ([place]) 09/16/2018  . Acute congestive heart failure ([place])   . NSTEMI (non-ST elevated myocardial infarction) ([place]) 08/06/2018  . Hypokalemia 08/06/2018  . Diabetes mellitus ([place]) 08/06/2018  . NSTEMI, initial episode of care ([place]) 08/06/2018  . Respiratory failure ([place]) 08/06/2018    Past Surgical History:  Procedure Laterality Date  . CORONARY STENT INTERVENTION N/A 08/10/2018   Procedure: CORONARY STENT INTERVENTION;  Surgeon: Leonie Man, MD;  Location: [place] [facility];  Service: Cardiovascular;  Laterality: N/A;  . RIGHT/LEFT HEART CATH AND CORONARY ANGIOGRAPHY N/A 08/10/2018   Procedure: RIGHT/LEFT HEART CATH AND CORONARY ANGIOGRAPHY;   Surgeon: Leonie Man, MD;  Location: [place] [facility];  Service: Cardiovascular;  Laterality: N/A;     OB History   No obstetric history on file.      Home Medications    Prior to Admission medications   Medication Sig Start Date End Date Taking? Authorizing Provider  aspirin 81 MG chewable tablet Chew 1 tablet (81 mg total) by mouth daily. 08/18/18   Swayze, Ava, DO  atorvastatin (LIPITOR) 40 MG tablet Take 1 tablet (40 mg total) by mouth daily at 6 PM. 08/17/18   Swayze, Ava, DO  carvedilol (COREG) 3.125 MG tablet Take 1 tablet (3.125 mg total) by mouth 2 (two) times daily. 08/29/18 11/27/18  Shirley Friar, PA-C  digoxin (LANOXIN) 0.125 MG tablet Take 1 tablet (0.125 mg total) by mouth daily. 08/18/18   Swayze, Ava, DO  Insulin Glargine, 2 Unit Dial, (TOUJEO MAX SOLOSTAR) 300 UNIT/ML SOPN Inject 50 Units into the skin at bedtime.    [provider]  losartan (COZAAR) 25 MG tablet Take 1 tablet (25 mg total) by mouth at bedtime. 08/17/18   Swayze, Ava, DO  metFORMIN (GLUCOPHAGE) 1000 MG tablet Take 1,000 mg by mouth 2 (two) times daily with a meal.    [provider]  Multiple Vitamin (MULTIVITAMIN WITH MINERALS) TABS tablet Take 1 tablet by mouth daily. 08/18/18   Swayze, Ava, DO  pantoprazole (PROTONIX) 40 MG tablet Take 1 tablet (40 mg total) by mouth daily. 08/18/18   Swayze, Ava, DO  spironolactone (ALDACTONE) 25  MG tablet Take 1 tablet (25 mg total) by mouth daily. 08/18/18   Swayze, Ava, DO  ticagrelor (BRILINTA) 90 MG TABS tablet Take 1 tablet (90 mg total) by mouth 2 (two) times daily. 08/17/18   Swayze, Ava, DO    Family History Family History  Problem Relation Age of Onset  . Breast cancer Neg Hx     Social History Social History   Tobacco Use  . Smoking status: Current Every Day Smoker    Packs/day: 1.00  . Smokeless tobacco: Never Used  Substance Use Topics  . Alcohol use: Yes    Frequency: Never    Comment: 2-3 drinks per week   . Drug  use: Never     Allergies   Patient has no known allergies.   Review of Systems Review of Systems  Constitutional: Positive for fatigue. Negative for chills and fever.  HENT: Negative for congestion and rhinorrhea.   Eyes: Negative for visual disturbance.  Respiratory: Positive for shortness of breath. Negative for cough and wheezing.   Cardiovascular: Negative for chest pain and leg swelling.  Gastrointestinal: Negative for abdominal pain, diarrhea, nausea and vomiting.  Genitourinary: Negative for dysuria and flank pain.  Musculoskeletal: Negative for neck pain and neck stiffness.  Skin: Negative for rash and wound.  Allergic/Immunologic: Negative for immunocompromised state.  Neurological: Positive for weakness. Negative for syncope and headaches.  All other systems reviewed and are negative.    Physical Exam Updated Vital Signs BP 112/80   Pulse 81   Temp 97.6 F (36.4 C) (Rectal)   Resp (!) 24   Ht 5\' 3"  (1.6 m)   Wt 57 kg   LMP  (Exact Date)   SpO2 100%   BMI 22.25 kg/m   Physical Exam Vitals signs and nursing note reviewed.  Constitutional:      General: She is not in acute distress.    Appearance: She is well-developed.     Comments: Appears comfortable on BiPAP  HENT:     Head: Normocephalic and atraumatic.  Eyes:     Conjunctiva/sclera: Conjunctivae normal.  Neck:     Musculoskeletal: Neck supple.  Cardiovascular:     Rate and Rhythm: Normal rate and regular rhythm.     Heart sounds: Normal heart sounds. No murmur. No friction rub.  Pulmonary:     Effort: Pulmonary effort is normal. Tachypnea present. No respiratory distress.     Breath sounds: Examination of the right-upper field reveals rales. Examination of the left-upper field reveals rales. Examination of the right-middle field reveals rales. Examination of the left-middle field reveals rales. Examination of the right-lower field reveals rales. Examination of the left-lower field reveals rales.  Decreased breath sounds and rales present. No wheezing.  Abdominal:     General: There is no distension.     Palpations: Abdomen is soft.     Tenderness: There is no abdominal tenderness.  Skin:    General: Skin is warm.     Capillary Refill: Capillary refill takes less than 2 seconds.  Neurological:     Mental Status: She is alert and oriented to person, place, and time.     Motor: No abnormal muscle tone.      ED Treatments / Results  Labs (all labs ordered are listed, but only abnormal results are displayed) Labs Reviewed  COMPREHENSIVE METABOLIC PANEL - Abnormal; Notable for the following components:      Result Value   Sodium 134 (*)    CO2 16 (*)  Glucose, Bld 266 (*)    Creatinine, Ser 1.96 (*)    Albumin 3.3 (*)    AST 14 (*)    GFR calc non Af Amer 25 (*)    GFR calc Af Amer 30 (*)    All other components within normal limits  BRAIN NATRIURETIC PEPTIDE - Abnormal; Notable for the following components:   B Natriuretic Peptide 1,254.7 (*)    All other components within normal limits  TROPONIN I - Abnormal; Notable for the following components:   Troponin I 0.03 (*)    All other components within normal limits  LACTIC ACID, PLASMA - Abnormal; Notable for the following components:   Lactic Acid, Venous 2.3 (*)    All other components within normal limits  CBC WITH DIFFERENTIAL/PLATELET  LACTIC ACID, PLASMA    EKG EKG Interpretation  Date/Time:  Saturday September 16 2018 03:25:52 EDT Ventricular Rate:  107 PR Interval:    QRS Duration: 90 QT Interval:  329 QTC Calculation: 439 R Axis:   84 Text Interpretation:  Sinus tachycardia Multiple ventricular premature complexes Borderline right axis deviation Probable LVH with secondary repol abnrm Anterior Q waves, possibly due to LVH Minimal ST elevation, inferior leads Baseline wander in lead(s) V2 V3 Since last EKG, Q waves now evident in inferior leads Otherwise no significant change Confirmed by Duffy Bruce  [phone]) on 09/16/2018 5:47:32 AM   Radiology Dg Chest Portable 1 View  Result Date: 09/16/2018 CLINICAL DATA:  Shortness of breath, was attempting to lie down and sleep above was unable, history of hypertension, diabetes mellitus, smoker EXAM: PORTABLE CHEST 1 VIEW COMPARISON:  Portable exam 0342 hours compared to 08/15/2018 FINDINGS: Enlargement of cardiac silhouette. Mediastinal contours and pulmonary vascularity normal. Persistent BILATERAL perihilar and basilar infiltrates. No definite pleural effusion or pneumothorax. RIGHT upper lobe scarring and question underlying emphysematous changes. IMPRESSION: Enlargement of cardiac silhouette with persistent BILATERAL basilar and perihilar infiltrates. Electronically Signed   By: Lavonia Dana M.D.   On: 09/16/2018 04:06    Procedures Procedures (including critical care time)  Medications Ordered in ED Medications  furosemide (LASIX) injection 20 mg (20 mg Intravenous Given 09/16/18 0542)     Initial Impression / Assessment and Plan / ED Course  I have reviewed the triage vital signs and the nursing notes.  Pertinent labs & imaging results that were available during my care of the patient were reviewed by me and considered in my medical decision making (see chart for details).        68 year old female with past medical history as above including recent admission for PEA arrest, STEMI, and heart failure, here with shortness of breath.  Her shortness of breath seems to have responded remarkably well to BiPAP.  She is adamant she has not had any fevers or infectious symptoms.  Exam and lab work is consistent with likely recurrent, acute on chronic CHF.  Of note, she reportedly is not on Lasix at her facility.  Lactic acid minimally elevated but this seems to be her baseline and I suspect it could be from congestive hepatopathy.  She has a normal white blood cell count.  Chest x-ray does show patchy infiltrates, but this is similar to her recent  presentation and stable from her last chest x-ray which was read as edema, and given normal white blood cell count and absence of fever I have a low concern for infection clinically.  Will diurese gently, and admit.  Final Clinical Impressions(s) / ED Diagnoses  Final diagnoses:  Acute on chronic systolic congestive heart failure [facility])    ED Discharge Orders    None       Duffy Bruce, MD 09/16/18 [phone]

## 2018-09-16 NOTE — Progress Notes (Signed)
RT note: RT removed patient from BIPAP and placed on 4L nasal canula. RN aware, RT will continued to monitor.

## 2018-09-16 NOTE — ED Triage Notes (Signed)
Pt BIB [facility] for SOB and is on CPAP. EMS advised the pt was 89% on room air. EMS reports rales in the lower lobes and diffuse rhonchi in the upper lobes. EMS reports the pt was 94% on CPAP and felt more comfortable. EMS advised the pt has a hx of CHF but does not take any lasix. EMS advised 12 lead was unremarkable, vital signs stable, CBG as noted, and an IV established as noted. EMS notes marked improvement in respiratory status.   Pt reports she was attempting to lay down to sleep and was unable to do so. Pt reports she got really SOB and then it gradually got worse. Pt denies any chest pains or new swelling.

## 2018-09-16 NOTE — Plan of Care (Signed)
  Problem: Education: Goal: Knowledge of General Education information will improve Description Including pain rating scale, medication(s)/side effects and non-pharmacologic comfort measures Outcome: Progressing   Problem: Clinical Measurements: Goal: Will remain free from infection Outcome: Progressing Goal: Respiratory complications will improve Outcome: Progressing   Problem: Health Behavior/Discharge Planning: Goal: Ability to manage health-related needs will improve Outcome: Not Progressing

## 2018-09-17 LAB — CBC
HCT: 36.5 % (ref 36.0–46.0)
Hemoglobin: 11.8 g/dL — ABNORMAL LOW (ref 12.0–15.0)
MCH: 27.1 pg (ref 26.0–34.0)
MCHC: 32.3 g/dL (ref 30.0–36.0)
MCV: 83.9 fL (ref 80.0–100.0)
Platelets: 261 10*3/uL (ref 150–400)
RBC: 4.35 MIL/uL (ref 3.87–5.11)
RDW: 14.4 % (ref 11.5–15.5)
WBC: 5.6 10*3/uL (ref 4.0–10.5)
nRBC: 0 % (ref 0.0–0.2)

## 2018-09-17 LAB — GLUCOSE, CAPILLARY
GLUCOSE-CAPILLARY: 68 mg/dL — AB (ref 70–99)
GLUCOSE-CAPILLARY: 80 mg/dL (ref 70–99)
GLUCOSE-CAPILLARY: 89 mg/dL (ref 70–99)
Glucose-Capillary: 48 mg/dL — ABNORMAL LOW (ref 70–99)
Glucose-Capillary: 186 mg/dL — ABNORMAL HIGH (ref 70–99)
Glucose-Capillary: 46 mg/dL — ABNORMAL LOW (ref 70–99)
Glucose-Capillary: 49 mg/dL — ABNORMAL LOW (ref 70–99)
Glucose-Capillary: 57 mg/dL — ABNORMAL LOW (ref 70–99)
Glucose-Capillary: 92 mg/dL (ref 70–99)
Glucose-Capillary: 110 mg/dL — ABNORMAL HIGH (ref 70–99)

## 2018-09-17 LAB — BASIC METABOLIC PANEL
Anion gap: 8 (ref 5–15)
BUN: 24 mg/dL — ABNORMAL HIGH (ref 8–23)
CO2: 22 mmol/L (ref 22–32)
Calcium: 9.2 mg/dL (ref 8.9–10.3)
Chloride: 106 mmol/L (ref 98–111)
Creatinine, Ser: 1.84 mg/dL — ABNORMAL HIGH (ref 0.44–1.00)
GFR calc Af Amer: 32 mL/min — ABNORMAL LOW (ref >60)
GFR calc non Af Amer: 27 mL/min — ABNORMAL LOW (ref >60)
Glucose, Bld: 64 mg/dL — ABNORMAL LOW (ref 70–99)
Potassium: 3.6 mmol/L (ref 3.5–5.1)
Sodium: 136 mmol/L (ref 135–145)

## 2018-09-17 LAB — HEMOGLOBIN A1C
Hgb A1c MFr Bld: 9.3 % — ABNORMAL HIGH (ref 4.8–5.6)
Mean Plasma Glucose: 220.21 mg/dL

## 2018-09-17 MED ORDER — DEXTROSE 50 % IV SOLN
INTRAVENOUS | Status: AC
  Administered 2018-09-17: 50 mL
  Filled 2018-09-17: qty 50

## 2018-09-17 MED ORDER — INSULIN GLARGINE 100 UNIT/ML ~~LOC~~ SOLN
20.0000 [IU] | Freq: Every day | SUBCUTANEOUS | Status: DC
  Filled 2018-09-17: qty 0.2

## 2018-09-17 MED ORDER — SODIUM CHLORIDE 0.9 % IV BOLUS
500.0000 mL | Freq: Once | INTRAVENOUS | Status: AC
  Administered 2018-09-17: 500 mL via INTRAVENOUS

## 2018-09-17 NOTE — Progress Notes (Signed)
PT BS @ 80 after juice and food. PT states she feels fine. No complaints. Will continue to monitor. Jerald Kief, RN

## 2018-09-17 NOTE — Progress Notes (Signed)
Patient given 50 units scheduled Lantus at bed time. Serum glucose at 0300 64. Was not notified by lab of low glucose. Checked CBG @ 0500, resulted 48. Gave patient 4 oz OJ and applesauce. Repeat CBG 0540 was 46. Gave patient D50. Will recheck CBG.   BP soft. Patient was given coreg at HS. Will continue to monitor

## 2018-09-17 NOTE — Progress Notes (Signed)
PROGRESS NOTE    [name]  LYY:[id number] DOB: June 11, 1951 DOA: 09/16/2018 PCP: [name], MD  Outpatient Specialists:   Brief Narrative:  Patient is a 68 year old female with past medical history significant for PEA arrest, NSTEMI with recent PCI to LAD in February of this year, CHF with exacerbation, known coronary artery disease and severe COPD.  Patient was admitted with shortness of breath and orthopnea.  Cardiogram done in February 2020 revealed EF of 20 to 25% and indeterminate diastolic function.  Patient is currently being managed for acute on chronic CHF exacerbation.  Patient is fortunately twice daily.  Shortness of breath has improved significantly.  Low blood sugar was noted last night, with blood sugar dropping to 46 around 5 AM today.  Patient is on subcutaneous Lantus, as well as sliding scale insulin coverage.  Poor food intake has been noted.  Will hold subcutaneous Lantus insulin for now.  Will start caloric count.  Further management will depend on hospital course.   Assessment & Plan:   Active Problems:   Diabetes mellitus (HCC)   Acute on chronic systolic (congestive) heart failure (H[facility])   Acute on chronic respiratory failure with hypoxia (HCC)   CKD (chronic kidney disease), stage III (HCC)   CAD (coronary artery disease), native coronary artery  Acute hypoxic resp failure: Not on supplemental oxygen at home. Thought to be secondary to acute CHF. Cannot rule out baseline chronic interstitial lung disease Respiratory failure is improving. Titrate oxygen as deemed necessary.  A/c systolic CHF, known CAD, recent NSTEMI, cardiogenic shock, s/p PEA, s/p PCI: --BNP related on presentation.   -Minimally elevated troponin, trended, but flat.     -Continue IV Lasix. -Improving. -Continue Lipitor, ASA, Coreg, aldactone, Brilinta  -Query role of Entresto. --Digoxin is on hold.   CKD stage III: --stable, follow BMP with IV diuresis  Elevated lactate secondary  to resp distress on presentation, no s/s of infection --Repeat lactic acid in the morning.    DM type 2/hypoglycemia: -Discontinue subcutaneous Lantus -Continue SSI coverage. -Caloric count. -Further management will depend on hospital course.  DVT prophylaxis: Subcutaneous Lovenox 30 mg daily Family Communication:  Disposition Plan: Home eventually   Consultants:   None  Have low threshold to consult cardiology  Procedures:   None  Antimicrobials:   None  Subjective: Shortness of breath has improved. No chest pain.  Objective: Vitals:   09/16/18 2200 09/17/18 0410 09/17/18 0808 09/17/18 0832  BP:  (!) 84/59 92/72 105/78  Pulse: 75 71 61 72  Resp: 12 20 20 20   Temp:  (!) 97.5 F (36.4 C) 97.8 F (36.6 C)   TempSrc:  Oral Oral   SpO2: 100% 97% 100% 100%  Weight:  56.1 kg    Height:        Intake/Output Summary (Last 24 hours) at 09/17/2018 1641 Last data filed at 09/17/2018 1500 Gross per 24 hour  Intake 540 ml  Output 2603 ml  Net -2063 ml   Filed Weights   09/16/18 0335 09/17/18 0410  Weight: 57 kg 56.1 kg    Examination:  General exam: Appears calm and comfortable  Respiratory system: Coarse crackles right lung base posteriorly Cardiovascular system: S1 & S2 Gastrointestinal system: Abdomen is nondistended, soft and nontender. No organomegaly or masses felt. Normal bowel sounds heard. Central nervous system: Alert and oriented.  Patient moves all limbs.   Extremities: No leg edema.  Data Reviewed: I have personally reviewed following labs and imaging studies  CBC: Recent Labs  Lab 09/16/18 0336 09/17/18 0249  WBC 6.8 5.6  NEUTROABS 4.2  --   HGB 13.4 11.8*  HCT 42.1 36.5  MCV 87.3 83.9  PLT 307 782   Basic Metabolic Panel: Recent Labs  Lab 09/16/18 0336 09/17/18 0249  NA 134* 136  K 5.0 3.6  CL 106 106  CO2 16* 22  GLUCOSE 266* 64*  BUN 23 24*  CREATININE 1.96* 1.84*  CALCIUM 9.6 9.2   GFR: Estimated Creatinine  Clearance: 23.9 mL/min (A) (by C-G formula based on SCr of 1.84 mg/dL (H)). Liver Function Tests: Recent Labs  Lab 09/16/18 0336  AST 14*  ALT 21  ALKPHOS 88  BILITOT 0.8  PROT 7.9  ALBUMIN 3.3*   No results for input(s): LIPASE, AMYLASE in the last 168 hours. No results for input(s): AMMONIA in the last 168 hours. Coagulation Profile: No results for input(s): INR, PROTIME in the last 168 hours. Cardiac Enzymes: Recent Labs  Lab 09/16/18 0336 09/16/18 1142  TROPONINI 0.03* 0.04*   BNP (last 3 results) No results for input(s): PROBNP in the last 8760 hours. HbA1C: Recent Labs    09/17/18 0249  HGBA1C 9.3*   CBG: Recent Labs  Lab 09/17/18 1113 09/17/18 1234 09/17/18 1301 09/17/18 1321 09/17/18 1611  GLUCAP 49* 57* 68* 80 89   Lipid Profile: No results for input(s): CHOL, HDL, LDLCALC, TRIG, CHOLHDL, LDLDIRECT in the last 72 hours. Thyroid Function Tests: No results for input(s): TSH, T4TOTAL, FREET4, T3FREE, THYROIDAB in the last 72 hours. Anemia Panel: No results for input(s): VITAMINB12, FOLATE, FERRITIN, TIBC, IRON, RETICCTPCT in the last 72 hours. Urine analysis:    Component Value Date/Time   COLORURINE STRAW (A) 08/10/2018 2055   APPEARANCEUR CLEAR 08/10/2018 2055   LABSPEC 1.009 08/10/2018 2055   PHURINE 5.0 08/10/2018 2055   GLUCOSEU NEGATIVE 08/10/2018 2055   HGBUR NEGATIVE 08/10/2018 2055   BILIRUBINUR NEGATIVE 08/10/2018 2055   [place] NEGATIVE 08/10/2018 2055   PROTEINUR NEGATIVE 08/10/2018 2055   NITRITE NEGATIVE 08/10/2018 2055   LEUKOCYTESUR NEGATIVE 08/10/2018 2055   Sepsis Labs: @LABRCNTIP (procalcitonin:4,lacticidven:4)  )No results found for this or any previous visit (from the past 240 hour(s)).       Radiology Studies: Dg Chest Portable 1 View  Result Date: 09/16/2018 CLINICAL DATA:  Shortness of breath, was attempting to lie down and sleep above was unable, history of hypertension, diabetes mellitus, smoker EXAM: PORTABLE  CHEST 1 VIEW COMPARISON:  Portable exam 0342 hours compared to 08/15/2018 FINDINGS: Enlargement of cardiac silhouette. Mediastinal contours and pulmonary vascularity normal. Persistent BILATERAL perihilar and basilar infiltrates. No definite pleural effusion or pneumothorax. RIGHT upper lobe scarring and question underlying emphysematous changes. IMPRESSION: Enlargement of cardiac silhouette with persistent BILATERAL basilar and perihilar infiltrates. Electronically Signed   By: Lavonia Dana M.D.   On: 09/16/2018 04:06        Scheduled Meds: . aspirin  81 mg Oral Daily  . atorvastatin  40 mg Oral q1800  . carvedilol  3.125 mg Oral BID  . enoxaparin (LOVENOX) injection  30 mg Subcutaneous Daily  . furosemide  40 mg Intravenous BID  . insulin aspart  0-5 Units Subcutaneous QHS  . insulin aspart  0-9 Units Subcutaneous TID WC  . insulin glargine  20 Units Subcutaneous QHS  . pantoprazole  40 mg Oral Daily  . sodium chloride flush  3 mL Intravenous Q12H  . sodium chloride flush  3 mL Intravenous Q12H  . spironolactone  25 mg Oral Daily  .  ticagrelor  90 mg Oral BID   Continuous Infusions: . sodium chloride       LOS: 1 day    Time spent: 35 minutes      Dana Allan, MD  [facility] Pager #: [phone] 7PM-7AM contact night coverage as above

## 2018-09-17 NOTE — Progress Notes (Signed)
Hypoglycemic Event  CBG: 48  Treatment: 4 oz juice/soda  Symptoms: None  Follow-up CBG: Time: 0540 CBG Result:46  Possible Reasons for Event: too much Lantus? Inadequate meal intake  Comments/MD notified:  Gave patient D50 push   Recheck CBG 186    Alexandr Yaworski, Dene Gentry

## 2018-09-17 NOTE — Plan of Care (Signed)
  Problem: Health Behavior/Discharge Planning: Goal: Ability to manage health-related needs will improve Outcome: Progressing   Problem: Clinical Measurements: Goal: Respiratory complications will improve Outcome: Progressing   Problem: Nutrition: Goal: Adequate nutrition will be maintained Outcome: Progressing   

## 2018-09-17 NOTE — Progress Notes (Addendum)
Pt BS 49. OJ and graham crackers given. Insulin withheld. PT asymptomatic. Will recheck in 15 mins.  Jerald Kief, RN

## 2018-09-17 NOTE — Progress Notes (Signed)
PT BS @ 57. PT has lunch now. Will recheck after eating. Also gave juice and encoraged pt to drink. PT still asymptomatic.  Jerald Kief, RN

## 2018-09-18 LAB — BASIC METABOLIC PANEL
Anion gap: 12 (ref 5–15)
BUN: 22 mg/dL (ref 8–23)
CO2: 21 mmol/L — ABNORMAL LOW (ref 22–32)
Calcium: 8.9 mg/dL (ref 8.9–10.3)
Chloride: 103 mmol/L (ref 98–111)
Creatinine, Ser: 1.86 mg/dL — ABNORMAL HIGH (ref 0.44–1.00)
GFR calc Af Amer: 31 mL/min — ABNORMAL LOW (ref >60)
GFR calc non Af Amer: 27 mL/min — ABNORMAL LOW (ref >60)
Glucose, Bld: 76 mg/dL (ref 70–99)
Potassium: 3.4 mmol/L — ABNORMAL LOW (ref 3.5–5.1)
SODIUM: 136 mmol/L (ref 135–145)

## 2018-09-18 LAB — GLUCOSE, CAPILLARY
GLUCOSE-CAPILLARY: 80 mg/dL (ref 70–99)
Glucose-Capillary: 81 mg/dL (ref 70–99)
Glucose-Capillary: 125 mg/dL — ABNORMAL HIGH (ref 70–99)
Glucose-Capillary: 122 mg/dL — ABNORMAL HIGH (ref 70–99)

## 2018-09-18 MED ORDER — FUROSEMIDE 10 MG/ML IJ SOLN: 40.0000 mg | Freq: Every day | INTRAMUSCULAR | Status: DC

## 2018-09-18 MED ORDER — ACETAMINOPHEN 325 MG PO TABS: 650.0000 mg | ORAL_TABLET | Freq: Four times a day (QID) | ORAL | Status: DC | PRN

## 2018-09-18 MED ORDER — ENOXAPARIN SODIUM 30 MG/0.3ML ~~LOC~~ SOLN
30.0000 mg | SUBCUTANEOUS | Status: DC
  Filled 2018-09-18: qty 0.3

## 2018-09-18 MED ORDER — POTASSIUM CHLORIDE CRYS ER 20 MEQ PO TBCR
40.0000 meq | EXTENDED_RELEASE_TABLET | Freq: Two times a day (BID) | ORAL | Status: DC
  Administered 2018-09-18 (×2): 40 meq via ORAL
  Filled 2018-09-18 (×2): qty 2

## 2018-09-18 MED ORDER — GLUCERNA SHAKE PO LIQD
237.0000 mL | Freq: Three times a day (TID) | ORAL | Status: DC
  Administered 2018-09-18 – 2018-09-19 (×4): 237 mL via ORAL

## 2018-09-18 MED ORDER — DIGOXIN 125 MCG PO TABS
0.1250 mg | ORAL_TABLET | Freq: Every day | ORAL | Status: DC
  Administered 2018-09-18 – 2018-09-19 (×2): 0.125 mg via ORAL
  Filled 2018-09-18 (×2): qty 1

## 2018-09-18 NOTE — Progress Notes (Addendum)
Initial Nutrition Assessment  DOCUMENTATION CODES:   Non-severe (moderate) malnutrition in context of chronic illness  INTERVENTION:    Glucerna Shake po TID, each supplement provides 220 kcal and 10 grams of protein  D/C calorie count  NUTRITION DIAGNOSIS:   Moderate Malnutrition related to chronic illness(COPD) as evidenced by mild muscle depletion, percent weight loss(5% weight loss in 1 month).  GOAL:   Patient will meet greater than or equal to 90% of their needs  MONITOR:   PO intake, Supplement acceptance  REASON FOR ASSESSMENT:   Malnutrition Screening Tool, Consult Enteral/tube feeding initiation and management  ASSESSMENT:   68 yo female with PMH of DM, HTN, NSTEMI, CHF, and COPD who was admitted with SOB and orthopnea r/t CHF exacerbation.   Received consult for calorie count due to low glucoses. Calorie count was ordered yesterday, but no meal tickets kept. Will d/c calorie count, would likely not provide any helpful information.   Patient reports that she has been eating okay. She ate a half bowl of frosted flakes with milk for breakfast today. She consumed 100% of lunch yesterday.   Patient has lost 5.3% of usual weight over the past month, significant for the time frame. Suspect intake PTA was not optimal related to decline in appetite.   Labs reviewed. Potassium 3.4 (L), creatinine 1.86 (H) CBG's: 80-81 this morning Medications reviewed and include novolog, spironolactone, lasix.   NUTRITION - FOCUSED PHYSICAL EXAM:    Most Recent Value  Orbital Region  No depletion  Upper Arm Region  No depletion  Thoracic and Lumbar Region  No depletion  Buccal Region  No depletion  Temple Region  No depletion  Clavicle Bone Region  No depletion  Clavicle and Acromion Bone Region  No depletion  Scapular Bone Region  No depletion  Dorsal Hand  No depletion  Patellar Region  Mild depletion  Anterior Thigh Region  Mild depletion  Posterior Calf Region  Mild  depletion  Edema (RD Assessment)  None  Hair  Reviewed  Eyes  Reviewed  Mouth  Reviewed  Skin  Reviewed  Nails  Reviewed       Diet Order:   Diet Order            Diet heart healthy/carb modified Room service appropriate? Yes with Assist; Fluid consistency: Thin  Diet effective now              EDUCATION NEEDS:   Education needs have been addressed  Skin:  Skin Assessment: Reviewed RN Assessment  Last BM:  3/22  Height:   Ht Readings from Last 1 Encounters:  09/16/18 5\' 3"  (1.6 m)    Weight:   Wt Readings from Last 1 Encounters:  09/18/18 55.3 kg    Ideal Body Weight:  52.3 kg  BMI:  Body mass index is 21.6 kg/m.  Estimated Nutritional Needs:   Kcal:  1400-1600  Protein:  65-75 gm  Fluid:  1.5 L    Molli Barrows, RD, LDN, CNSC Pager [phone] After Hours Pager [phone]

## 2018-09-18 NOTE — Progress Notes (Signed)
Notified by CCMD that patient had 8 beat run vtach. Patient asymptomatic and was sleeping at the time. Will continue to monitor

## 2018-09-18 NOTE — Progress Notes (Signed)
[place] TEAM 1 - Stepdown/ICU TEAM  [name]  IRJ:[id number] DOB: 19-Dec-1950 DOA: 09/16/2018 PCP: [name], MD    Brief Narrative:  28ZS w/ a hx of PEA arrest due to NSTEMI with PCI to LAD in February 2020, CHF (EF 20-25% Feb 2020), and severe COPD who was admitted with shortness of breath and orthopnea.    Subjective: The patient reports that she is beginning to feel better.  She is not yet back to her baseline as she remains somewhat short of breath.  She denies chest pressure or pain.  She reports a poor appetite.  She feels weak in general.  Assessment & Plan:  Acute hypoxic resp failure Not on supplemental oxygen at home - secondary to acute CHF/pulmonary edema -weaned to room air  Acute on Chronic CHF 58.4kg at time of d/c Feb 20 - net negative ~2L since admit -follow daily weights and I's/O's -clinically does not appear significantly volume overloaded on exam  Filed Weights   09/16/18 0335 09/17/18 0410 09/18/18 0500  Weight: 57 kg 56.1 kg 55.3 kg    Hypokalemia  Due to diuresis -supplement and follow -check magnesium in a.m.  Pulmonary Venous HTN  Severe COPD Well compensated at this time   CAD, recent NSTEMI w/ cardiogenic shock s/p PCI Cont Brilinta -asymptomatic presently  CKD stage III   crt at d/c after Feb admit ~1.75 - f/u crt Mar 3/20 1.97 -follow with ongoing diuresis  DM2 w/ hypoglycemia A1c 9.3-CBG currently well controlled with no further hypoglycemia after stopping Lantus  DVT prophylaxis: lovenox  Code Status: FULL CODE Family Communication: no family present at time of exam  Disposition Plan: Stable for telemetry bed -PT/OT -possible discharge 48 hours  Consultants:  none  Antimicrobials:  none   Objective: Blood pressure 96/66, pulse 75, temperature 97.6 F (36.4 C), temperature source Oral, resp. rate 20, height 5\' 3"  (1.6 m), weight 55.3 kg, SpO2 98 %.  Intake/Output Summary (Last 24 hours) at 09/18/2018 1335 Last data  filed at 09/17/2018 2311 Gross per 24 hour  Intake 0 ml  Output 1 ml  Net -1 ml   Filed Weights   09/16/18 0335 09/17/18 0410 09/18/18 0500  Weight: 57 kg 56.1 kg 55.3 kg    Examination: General: No acute respiratory distress Lungs: Clear to auscultation bilaterally without wheezes or crackles Cardiovascular: Regular rate and rhythm without murmur gallop or rub normal S1 and S2 Abdomen: Nontender, nondistended, soft, bowel sounds positive, no rebound, no ascites, no appreciable mass Extremities: No significant cyanosis, clubbing, or edema bilateral lower extremities  CBC: Recent Labs  Lab 09/16/18 0336 09/17/18 0249  WBC 6.8 5.6  NEUTROABS 4.2  --   HGB 13.4 11.8*  HCT 42.1 36.5  MCV 87.3 83.9  PLT 307 010   Basic Metabolic Panel: Recent Labs  Lab 09/16/18 0336 09/17/18 0249 09/18/18 0308  NA 134* 136 136  K 5.0 3.6 3.4*  CL 106 106 103  CO2 16* 22 21*  GLUCOSE 266* 64* 76  BUN 23 24* 22  CREATININE 1.96* 1.84* 1.86*  CALCIUM 9.6 9.2 8.9   GFR: Estimated Creatinine Clearance: 23.6 mL/min (A) (by C-G formula based on SCr of 1.86 mg/dL (H)).  Liver Function Tests: Recent Labs  Lab 09/16/18 0336  AST 14*  ALT 21  ALKPHOS 88  BILITOT 0.8  PROT 7.9  ALBUMIN 3.3*    Cardiac Enzymes: Recent Labs  Lab 09/16/18 0336 09/16/18 1142  TROPONINI 0.03* 0.04*    HbA1C:  Hgb A1c MFr Bld  Date/Time Value Ref Range Status  09/17/2018 02:49 AM 9.3 (H) 4.8 - 5.6 % Final    Comment:    (NOTE) Pre diabetes:          5.7%-6.4% Diabetes:              >6.4% Glycemic control for   <7.0% adults with diabetes   08/06/2018 02:00 PM 10.2 (H) 4.8 - 5.6 % Final    Comment:    (NOTE) Pre diabetes:          5.7%-6.4% Diabetes:              >6.4% Glycemic control for   <7.0% adults with diabetes     CBG: Recent Labs  Lab 09/17/18 1611 09/17/18 1841 09/17/18 2046 09/18/18 0601 09/18/18 1127  GLUCAP 89 92 110* 80 81     Scheduled Meds: . aspirin  81 mg  Oral Daily  . atorvastatin  40 mg Oral q1800  . carvedilol  3.125 mg Oral BID  . enoxaparin (LOVENOX) injection  30 mg Subcutaneous Daily  . feeding supplement (GLUCERNA SHAKE)  237 mL Oral TID BM  . furosemide  40 mg Intravenous BID  . insulin aspart  0-5 Units Subcutaneous QHS  . insulin aspart  0-9 Units Subcutaneous TID WC  . pantoprazole  40 mg Oral Daily  . sodium chloride flush  3 mL Intravenous Q12H  . sodium chloride flush  3 mL Intravenous Q12H  . spironolactone  25 mg Oral Daily  . ticagrelor  90 mg Oral BID     LOS: 2 days   Cherene Altes, MD [facility] Office  [phone] Pager - Text Page per Amion  If 7PM-7AM, please contact night-coverage per Amion 09/18/2018, 1:35 PM

## 2018-09-19 DIAGNOSIS — E440 Moderate protein-calorie malnutrition: Secondary | ICD-10-CM | POA: Diagnosis present

## 2018-09-19 LAB — GLUCOSE, CAPILLARY
Glucose-Capillary: 135 mg/dL — ABNORMAL HIGH (ref 70–99)
Glucose-Capillary: 156 mg/dL — ABNORMAL HIGH (ref 70–99)

## 2018-09-19 LAB — BASIC METABOLIC PANEL
Anion gap: 11 (ref 5–15)
BUN: 23 mg/dL (ref 8–23)
CO2: 19 mmol/L — ABNORMAL LOW (ref 22–32)
Calcium: 9 mg/dL (ref 8.9–10.3)
Chloride: 106 mmol/L (ref 98–111)
Creatinine, Ser: 2.05 mg/dL — ABNORMAL HIGH (ref 0.44–1.00)
GFR calc Af Amer: 28 mL/min — ABNORMAL LOW (ref >60)
GFR calc non Af Amer: 24 mL/min — ABNORMAL LOW (ref >60)
Glucose, Bld: 168 mg/dL — ABNORMAL HIGH (ref 70–99)
Potassium: 4.8 mmol/L (ref 3.5–5.1)
Sodium: 136 mmol/L (ref 135–145)

## 2018-09-19 LAB — CBC
HCT: 37.9 % (ref 36.0–46.0)
Hemoglobin: 12.5 g/dL (ref 12.0–15.0)
MCH: 27.6 pg (ref 26.0–34.0)
MCHC: 33 g/dL (ref 30.0–36.0)
MCV: 83.7 fL (ref 80.0–100.0)
Platelets: 292 10*3/uL (ref 150–400)
RBC: 4.53 MIL/uL (ref 3.87–5.11)
RDW: 14.5 % (ref 11.5–15.5)
WBC: 5.8 10*3/uL (ref 4.0–10.5)
nRBC: 0 % (ref 0.0–0.2)

## 2018-09-19 LAB — DIGOXIN LEVEL: Digoxin Level: 0.6 ng/mL — ABNORMAL LOW (ref 0.8–2.0)

## 2018-09-19 LAB — MAGNESIUM: Magnesium: 1.2 mg/dL — ABNORMAL LOW (ref 1.7–2.4)

## 2018-09-19 MED ORDER — FUROSEMIDE 40 MG PO TABS: 40.0000 mg | ORAL_TABLET | Freq: Every day | ORAL | 11 refills | Status: DC | PRN

## 2018-09-19 MED ORDER — SPIRONOLACTONE 25 MG PO TABS: 25.0000 mg | ORAL_TABLET | Freq: Every day | ORAL | Status: DC

## 2018-09-19 MED ORDER — FUROSEMIDE 40 MG PO TABS
40.0000 mg | ORAL_TABLET | Freq: Every day | ORAL | Status: DC
  Administered 2018-09-19: 40 mg via ORAL
  Filled 2018-09-19: qty 1

## 2018-09-19 MED ORDER — MAGNESIUM SULFATE 2 GM/50ML IV SOLN
2.0000 g | Freq: Once | INTRAVENOUS | Status: AC
  Administered 2018-09-19: 2 g via INTRAVENOUS
  Filled 2018-09-19: qty 50

## 2018-09-19 NOTE — Discharge Summary (Addendum)
Physician Discharge Summary  Emry Tobin DIY:[id number] DOB: 01-Aug-1950 DOA: 09/16/2018  PCP: [name], MD  Admit date: 09/16/2018 Discharge date: 09/19/2018  Time spent: 45 minutes  Recommendations for Outpatient Follow-up:  1. Follow up with Heart Failure clinic. Office will call you to schedule 2. Weigh daily as instructed 3. BMP 1 week    Discharge Diagnoses:  Principal Problem:   Acute on chronic respiratory failure with hypoxia (HCC) Active Problems:   Acute on chronic systolic (congestive) heart failure (HCC)   CAD (coronary artery disease), native coronary artery   Diabetes mellitus (HCC)   CKD (chronic kidney disease), stage III (HCC)   Malnutrition of moderate degree   Discharge Condition: stable  Diet recommendation: low to no salt heart healthy  Filed Weights   09/17/18 0410 09/18/18 0500 09/19/18 0407  Weight: 56.1 kg 55.3 kg 53 kg    History of present illness:  Patient is a 68 year old female with past medical history significant for PEA arrest, NSTEMI with recent PCI to LAD in February of this year, CHF with exacerbation, known coronary artery disease and severe COPD.  Patient was admitted 09/16/18 with shortness of breath and orthopnea.  Cardiogram done in February 2020 revealed EF of 20 to 25% and indeterminate diastolic function.  Patient admitted with acute on chronic respiratory failure related to acute on chronic systolic heart failure likely secondary to dietary indiscretion and confusion on lasix.   Hospital Course:  Acute hypoxicresp failure Not on supplemental oxygen at home - secondary to acute CHF/pulmonary edema -weaned to room air. Oxygen saturation level greater than 90% on room air at discharge  Acute on Chronic CHF 58.4kg at time of d/c Feb 20. Weight 55kg at discharge after IV lasix. Will discharge with prn lasix. Apparently plan same at prior discharge but medication did not get ordered and was not on med "list"' per patient and her  spouse. Have instructed to weigh daily and take lasix with weight gain.        Filed Weights   09/16/18 0335 09/17/18 0410 09/18/18 0500  Weight: 57 kg 56.1 kg 55.3 kg    Hypokalemia  Due to diuresis - resolved at discharge. Mag level 1.2. will give 2gm before discharge  Pulmonary Venous HTN  Severe COPD Well compensated at time of discharge   CAD, recent NSTEMI w/ cardiogenic shock s/p PCI Cont Brilinta -asymptomatic at discharge  CKD stage III crt at d/c after Feb admit ~1.75 - f/u crt Mar 3/20 1.97. at discharge 2.05. likely related to diuresis. Recommend bmet in 1-2 weeks  DM2 w/ hypoglycemia. Had one hypoglycemic event. Taking po's well at discharge. Serum glucose 168 at discharge. Will resume home regimen.   Procedures:    Consultations:  Vitals:   09/19/18 0700 09/19/18 0900  BP:    Pulse:    Resp: (!) 29 16  Temp:    SpO2:      General: sitting up in bed eating breakfast no acute distress Cardiovascular: rrr no mgr no LE edema Respiratory: normal effort BS clear bilaterally no wheeze  Discharge Instructions   Discharge Instructions    (HEART FAILURE PATIENTS) Call MD:  Anytime you have any of the following symptoms: 1) 3 pound weight gain in 24 hours or 5 pounds in 1 week 2) shortness of breath, with or without a dry hacking cough 3) swelling in the hands, feet or stomach 4) if you have to sleep on extra pillows at night in order to breathe.   Complete  by:  As directed    Ambulatory referral to Cardiology   Complete by:  As directed    Diet - low sodium heart healthy   Complete by:  As directed    Little to no salt   Discharge instructions   Complete by:  As directed    Follow up with cards Take medication as directed   Heart Failure patients record your daily weight using the same scale at the same time of day   Complete by:  As directed    Increase activity slowly   Complete by:  As directed      Allergies as of 09/19/2018   No  Known Allergies     Medication List    TAKE these medications   aspirin 81 MG chewable tablet Chew 1 tablet (81 mg total) by mouth daily.   atorvastatin 40 MG tablet Commonly known as:  LIPITOR Take 1 tablet (40 mg total) by mouth daily at 6 PM.   carvedilol 3.125 MG tablet Commonly known as:  COREG Take 1 tablet (3.125 mg total) by mouth 2 (two) times daily.   digoxin 0.125 MG tablet Commonly known as:  LANOXIN Take 1 tablet (0.125 mg total) by mouth daily.   furosemide 40 MG tablet Commonly known as:  Lasix Take 1 tablet (40 mg total) by mouth daily as needed for fluid or edema (take one tab if 3 pound weight gain in 24 hours or 5 pound weight gain in 1 week).   losartan 25 MG tablet Commonly known as:  COZAAR Take 1 tablet (25 mg total) by mouth at bedtime.   metFORMIN 1000 MG tablet Commonly known as:  GLUCOPHAGE Take 1,000 mg by mouth 2 (two) times daily with a meal.   multivitamin with minerals Tabs tablet Take 1 tablet by mouth daily.   pantoprazole 40 MG tablet Commonly known as:  PROTONIX Take 1 tablet (40 mg total) by mouth daily.   spironolactone 25 MG tablet Commonly known as:  ALDACTONE Take 1 tablet (25 mg total) by mouth daily.   ticagrelor 90 MG Tabs tablet Commonly known as:  BRILINTA Take 1 tablet (90 mg total) by mouth 2 (two) times daily.   Toujeo Max SoloStar 300 UNIT/ML Sopn Generic drug:  Insulin Glargine (2 Unit Dial) Inject 50 Units into the skin at bedtime.      No Known Allergies    The results of significant diagnostics from this hospitalization (including imaging, microbiology, ancillary and laboratory) are listed below for reference.    Significant Diagnostic Studies: Dg Chest Portable 1 View  Result Date: 09/16/2018 CLINICAL DATA:  Shortness of breath, was attempting to lie down and sleep above was unable, history of hypertension, diabetes mellitus, smoker EXAM: PORTABLE CHEST 1 VIEW COMPARISON:  Portable exam 0342 hours  compared to 08/15/2018 FINDINGS: Enlargement of cardiac silhouette. Mediastinal contours and pulmonary vascularity normal. Persistent BILATERAL perihilar and basilar infiltrates. No definite pleural effusion or pneumothorax. RIGHT upper lobe scarring and question underlying emphysematous changes. IMPRESSION: Enlargement of cardiac silhouette with persistent BILATERAL basilar and perihilar infiltrates. Electronically Signed   By: Lavonia Dana M.D.   On: 09/16/2018 04:06    Microbiology: No results found for this or any previous visit (from the past 240 hour(s)).   Labs: Basic Metabolic Panel: Recent Labs  Lab 09/16/18 0336 09/17/18 0249 09/18/18 0308 09/19/18 0243  NA 134* 136 136 136  K 5.0 3.6 3.4* 4.8  CL 106 106 103 106  CO2 16* 22 21*  19*  GLUCOSE 266* 64* 76 168*  BUN 23 24* 22 23  CREATININE 1.96* 1.84* 1.86* 2.05*  CALCIUM 9.6 9.2 8.9 9.0  MG  --   --   --  1.2*   Liver Function Tests: Recent Labs  Lab 09/16/18 0336  AST 14*  ALT 21  ALKPHOS 88  BILITOT 0.8  PROT 7.9  ALBUMIN 3.3*   No results for input(s): LIPASE, AMYLASE in the last 168 hours. No results for input(s): AMMONIA in the last 168 hours. CBC: Recent Labs  Lab 09/16/18 0336 09/17/18 0249 09/19/18 0243  WBC 6.8 5.6 5.8  NEUTROABS 4.2  --   --   HGB 13.4 11.8* 12.5  HCT 42.1 36.5 37.9  MCV 87.3 83.9 83.7  PLT 307 261 292   Cardiac Enzymes: Recent Labs  Lab 09/16/18 0336 09/16/18 1142  TROPONINI 0.03* 0.04*   BNP: BNP (last 3 results) Recent Labs    08/06/18 0628 09/16/18 0342  BNP 1,535.2* 1,254.7*    ProBNP (last 3 results) No results for input(s): PROBNP in the last 8760 hours.  CBG: Recent Labs  Lab 09/18/18 0601 09/18/18 1127 09/18/18 1628 09/18/18 2123 09/19/18 0639  GLUCAP 80 81 125* 122* 135*       Signed:  Radene Gunning NP [facility] 09/19/2018, 11:07 AM   Patient was seen, examined,treatment plan was discussed with the Advance Practice Provider.   I have personally reviewed the clinical findings, labs, EKG, imaging studies and management of this patient in detail. I have also reviewed the orders written for this patient which were under my direction. I agree with the documentation, as recorded by the Advance Practice Provider.   Angelee Atilano is a 68 y.o. female here with CHF exacerbation.  Has diuresed well based on weight but I/Os are not accurate.  Patient was not sent on lasix last discharge-- Dr. B wanted it PRN.  Patient also has not been restricting salt.  Long discussion with patient and wife.   Geradine Girt, DO   How to contact the [facility] Attending or Consulting provider [place] or covering provider during after hours [place], for this patient?  1. Check the care team in [facility] and look for a) attending/consulting TR[facility] provider listed and b) the [facility] team listed 2. Log into [email or web address] and use [place] universal password to access. If you do not have the password, please contact the hospital operator. 3. Locate the [facility] provider you are looking for under [facility] and page to a number that you can be directly reached. 4. If you still have difficulty reaching the provider, please page the [facility] (Director on Call) for the Hospitalists listed on amion for assistance.

## 2018-09-19 NOTE — TOC Transition Note (Signed)
Transition of Care [facility]) - CM/SW Discharge Note Marvetta Gibbons RN, BSN Transitions of Care Unit 4E- RN Case Manager [phone]  Patient Details  Name: [name] MRN: [id number] Date of Birth: 16-Sep-1950  Transition of Care [facility]) CM/SW Contact:  Dawayne Patricia, RN Phone Number: 09/19/2018, 4:36 PM   Clinical Narrative:    Pt for transition home, notified by [name] with [place], pt active with [facility], MD placed resumption order for [facility] services.    Final next level of care: [place] Barriers to Discharge: No Barriers Identified   Patient Goals and CMS Choice        Discharge Placement                       Discharge Plan and Services     Post Acute Care Choice: Resumption of Svcs/PTA Provider, Home Health              HH Arranged: RN [facility] Agency: [place] ([facility])   Social Determinants of Health (SDOH) Interventions     Readmission Risk Interventions No flowsheet data found.

## 2018-09-19 NOTE — Consult Note (Signed)
   [facility] CM Inpatient Consult   09/19/2018  Simi Burbano 10-28-1950 [id number]  Patient screened for potential [place] Management needs.   Patient was hospitalized for chart review and  per MD narrative: 68yo w/ a hx of PEA arrest due to NSTEMI with PCI to LAD in February 2020, CHF (EF 20-25% Feb 2020), and severe COPD who was admitted with shortness of breath and orthopnea.  Primary Care Provider is Dr. [name], this is not a [facility] provider.  Her Primary Care Provider is not a [facility] primary care provider or is not [facility] affiliated.   This patient is Not eligible for [facility] Care Management Services.     For questions contact:   Natividad Brood, RN BSN [place] [facility] Liaison  [phone] business mobile phone Toll free office [phone]

## 2018-09-19 NOTE — Evaluation (Signed)
Occupational Therapy Evaluation Patient Details Name: [name] MRN: [id number] DOB: July 26, 1950 Today's Date: 09/19/2018    History of Present Illness Pt adm with acute hypoxic respiratory failure due to acute heart failure/pulmonary edema. PMH - heart failure, copd, cad, ckd, dm, NSTEMI   Clinical Impression   Patient evaluated by Occupational Therapy with no further acute OT needs identified. All education has been completed and the patient has no further questions. See below for any follow-up Occupational Therapy or equipment needs. OT to sign off. Thank you for referral.      Follow Up Recommendations  No OT follow up    Equipment Recommendations  None recommended by OT    Recommendations for Other Services       Precautions / Restrictions Precautions Precautions: None      Mobility Bed Mobility Overal bed mobility: Independent                Transfers Overall transfer level: Independent                    Balance                                           ADL either performed or assessed with clinical judgement   ADL Overall ADL's : Independent                                       General ADL Comments: pt able to complete toilet transfer, sink level transfer, and transfer to LB dressing in consecutive order. pt reports a little "dizziness" initially with standing but once at chair level reports resolved. pt with brief note of Vtach on monitor during session but other wise all vital signs stable. pt is able to figure 4 cross so no AE needed. pt educated on energy conservation including water temperature for bathing.      Vision Baseline Vision/History: Wears glasses Wears Glasses: At all times       Perception     Praxis      Pertinent Vitals/Pain Pain Assessment: No/denies pain     Hand Dominance Right   Extremity/Trunk Assessment Upper Extremity Assessment Upper Extremity Assessment: Overall WFL  for tasks assessed   Lower Extremity Assessment Lower Extremity Assessment: Overall WFL for tasks assessed   Cervical / Trunk Assessment Cervical / Trunk Assessment: Normal   Communication Communication Communication: No difficulties   Cognition Arousal/Alertness: Awake/alert Behavior During Therapy: WFL for tasks assessed/performed Overall Cognitive Status: Within Functional Limits for tasks assessed                                     General Comments       Exercises     Shoulder Instructions      Home Living Family/patient expects to be discharged to:: Private residence Living Arrangements: Spouse/significant other Available Help at Discharge: Family Type of Home: Apartment Home Access: Level entry(the back of the apartment no stairs) Entrance Stairs-Number of Steps: 10   Home Layout: One level     Bathroom Shower/Tub: Teacher, early years/pre: Standard     Home Equipment: Cane - single point;Walker - 4 wheels  Prior Functioning/Environment Level of Independence: Independent with assistive device(s)        Comments: uses rollator outside the house and nothing inside        OT Problem List:        OT Treatment/Interventions:      OT Goals(Current goals can be found in the care plan section) Acute Rehab OT Goals Patient Stated Goal: to go home today  Potential to Achieve Goals: Good  OT Frequency:     Barriers to D/C:            Co-evaluation              AM-PAC OT "6 Clicks" Daily Activity     Outcome Measure Help from another person eating meals?: None Help from another person taking care of personal grooming?: None Help from another person toileting, which includes using toliet, bedpan, or urinal?: None Help from another person bathing (including washing, rinsing, drying)?: None Help from another person to put on and taking off regular upper body clothing?: None Help from another person to put on  and taking off regular lower body clothing?: None 6 Click Score: 24   End of Session Nurse Communication: Mobility status;Precautions  Activity Tolerance: Patient tolerated treatment well Patient left: in chair;with call bell/phone within reach  OT Visit Diagnosis: Muscle weakness (generalized) (M62.81)                Time: 4917-9150 OT Time Calculation (min): 12 min Charges:  OT General Charges $OT Visit: 1 Visit OT Evaluation $OT Eval Low Complexity: 1 Low   Jeri Modena, OTR/L  Acute Rehabilitation Services Pager: [phone] Office: [phone] .   Jeri Modena 09/19/2018, 11:30 AM

## 2018-09-19 NOTE — Evaluation (Signed)
Physical Therapy Evaluation Patient Details Name: [name] MRN: [id number] DOB: 1950/07/10 Today's Date: 09/19/2018   History of Present Illness  Pt adm with acute hypoxic respiratory failure due to acute heart failure/pulmonary edema. PMH - heart failure, copd, cad, ckd, dm, NSTEMI  Clinical Impression  Pt doing well with mobility and no further PT needed.  Ready for dc from PT standpoint.      Follow Up Recommendations No PT follow up    Equipment Recommendations  None recommended by PT    Recommendations for Other Services       Precautions / Restrictions Precautions Precautions: None Restrictions Weight Bearing Restrictions: No      Mobility  Bed Mobility Overal bed mobility: Independent                Transfers Overall transfer level: Independent Equipment used: 4-wheeled walker;None                Ambulation/Gait Ambulation/Gait assistance: Modified independent (Device/Increase time) Gait Distance (Feet): 300 Feet Assistive device: 4-wheeled walker;None Gait Pattern/deviations: WFL(Within Functional Limits) Gait velocity: decr Gait velocity interpretation: >2.62 ft/sec, indicative of community ambulatory General Gait Details: Steady gait with or without assistive device. SpO2 97% after amb on RA  Stairs            Wheelchair Mobility    Modified Rankin (Stroke Patients Only)       Balance Overall balance assessment: Mild deficits observed, not formally tested                                           Pertinent Vitals/Pain Pain Assessment: No/denies pain    Home Living Family/patient expects to be discharged to:: Private residence Living Arrangements: Spouse/significant other Available Help at Discharge: Family Type of Home: Apartment Home Access: Level entry(the back of the apartment no stairs)   Entrance Stairs-Number of Steps: 10 Home Layout: One level Home Equipment: Cane - single point;Walker - 4  wheels      Prior Function Level of Independence: Independent with assistive device(s)         Comments: uses rollator outside the house and nothing inside     Hand Dominance   Dominant Hand: Right    Extremity/Trunk Assessment   Upper Extremity Assessment Upper Extremity Assessment: Overall WFL for tasks assessed    Lower Extremity Assessment Lower Extremity Assessment: Overall WFL for tasks assessed    Cervical / Trunk Assessment Cervical / Trunk Assessment: Normal  Communication   Communication: No difficulties  Cognition Arousal/Alertness: Awake/alert Behavior During Therapy: WFL for tasks assessed/performed Overall Cognitive Status: Within Functional Limits for tasks assessed                                        General Comments      Exercises     Assessment/Plan    PT Assessment Patent does not need any further PT services  PT Problem List         PT Treatment Interventions      PT Goals (Current goals can be found in the Care Plan section)  Acute Rehab PT Goals Patient Stated Goal: to go home today  PT Goal Formulation: All assessment and education complete, DC therapy    Frequency     Barriers to discharge  Co-evaluation               AM-PAC PT "6 Clicks" Mobility  Outcome Measure Help needed turning from your back to your side while in a flat bed without using bedrails?: None Help needed moving from lying on your back to sitting on the side of a flat bed without using bedrails?: None Help needed moving to and from a bed to a chair (including a wheelchair)?: None Help needed standing up from a chair using your arms (e.g., wheelchair or bedside chair)?: None Help needed to walk in hospital room?: None Help needed climbing 3-5 steps with a railing? : None 6 Click Score: 24    End of Session   Activity Tolerance: Patient tolerated treatment well Patient left: in chair;with call bell/phone within reach    PT Visit Diagnosis: Difficulty in walking, not elsewhere classified (R26.2)    Time: 4010-2725 PT Time Calculation (min) (ACUTE ONLY): 11 min   Charges:   PT Evaluation $PT Eval Low Complexity: [place] Pager [phone] Office [place] 09/19/2018, 12:57 PM

## 2018-09-20 DIAGNOSIS — E119 Type 2 diabetes mellitus without complications: Secondary | ICD-10-CM | POA: Diagnosis not present

## 2018-09-20 DIAGNOSIS — Z9861 Coronary angioplasty status: Secondary | ICD-10-CM | POA: Diagnosis not present

## 2018-09-20 DIAGNOSIS — I214 Non-ST elevation (NSTEMI) myocardial infarction: Secondary | ICD-10-CM | POA: Diagnosis not present

## 2018-09-20 DIAGNOSIS — Z7982 Long term (current) use of aspirin: Secondary | ICD-10-CM | POA: Diagnosis not present

## 2018-09-20 DIAGNOSIS — I110 Hypertensive heart disease with heart failure: Secondary | ICD-10-CM | POA: Diagnosis not present

## 2018-09-20 DIAGNOSIS — J449 Chronic obstructive pulmonary disease, unspecified: Secondary | ICD-10-CM | POA: Diagnosis not present

## 2018-09-20 DIAGNOSIS — I2510 Atherosclerotic heart disease of native coronary artery without angina pectoris: Secondary | ICD-10-CM | POA: Diagnosis not present

## 2018-09-20 DIAGNOSIS — I509 Heart failure, unspecified: Secondary | ICD-10-CM | POA: Diagnosis not present

## 2018-09-20 DIAGNOSIS — Z794 Long term (current) use of insulin: Secondary | ICD-10-CM | POA: Diagnosis not present

## 2018-09-21 NOTE — Progress Notes (Signed)
This encounter was created in error - please disregard.

## 2018-09-25 ENCOUNTER — Telehealth (HOSPITAL_COMMUNITY): Payer: Self-pay | Admitting: Adult Health

## 2018-09-25 DIAGNOSIS — R410 Disorientation, unspecified: Secondary | ICD-10-CM | POA: Diagnosis not present

## 2018-09-25 DIAGNOSIS — I10 Essential (primary) hypertension: Secondary | ICD-10-CM | POA: Diagnosis not present

## 2018-09-25 DIAGNOSIS — I130 Hypertensive heart and chronic kidney disease with heart failure and stage 1 through stage 4 chronic kidney disease, or unspecified chronic kidney disease: Secondary | ICD-10-CM | POA: Diagnosis not present

## 2018-09-25 DIAGNOSIS — E162 Hypoglycemia, unspecified: Secondary | ICD-10-CM | POA: Diagnosis not present

## 2018-09-25 DIAGNOSIS — E161 Other hypoglycemia: Secondary | ICD-10-CM | POA: Diagnosis not present

## 2018-09-25 DIAGNOSIS — I5023 Acute on chronic systolic (congestive) heart failure: Secondary | ICD-10-CM | POA: Diagnosis not present

## 2018-09-25 DIAGNOSIS — E1122 Type 2 diabetes mellitus with diabetic chronic kidney disease: Secondary | ICD-10-CM | POA: Diagnosis not present

## 2018-09-25 DIAGNOSIS — Z87891 Personal history of nicotine dependence: Secondary | ICD-10-CM | POA: Diagnosis not present

## 2018-09-25 DIAGNOSIS — E1165 Type 2 diabetes mellitus with hyperglycemia: Secondary | ICD-10-CM | POA: Diagnosis not present

## 2018-09-25 DIAGNOSIS — I959 Hypotension, unspecified: Secondary | ICD-10-CM | POA: Diagnosis not present

## 2018-09-25 NOTE — Telephone Encounter (Signed)
Close encounter 

## 2018-09-26 ENCOUNTER — Encounter (HOSPITAL_COMMUNITY): Payer: Self-pay

## 2018-09-26 ENCOUNTER — Other Ambulatory Visit: Payer: Self-pay

## 2018-09-26 ENCOUNTER — Ambulatory Visit (HOSPITAL_COMMUNITY): Admission: RE | Admit: 2018-09-26 | Payer: Medicare HMO | Source: Ambulatory Visit

## 2018-09-28 DIAGNOSIS — E1122 Type 2 diabetes mellitus with diabetic chronic kidney disease: Secondary | ICD-10-CM | POA: Diagnosis not present

## 2018-09-28 DIAGNOSIS — N183 Chronic kidney disease, stage 3 (moderate): Secondary | ICD-10-CM | POA: Diagnosis not present

## 2018-09-28 DIAGNOSIS — J449 Chronic obstructive pulmonary disease, unspecified: Secondary | ICD-10-CM | POA: Diagnosis not present

## 2018-09-28 DIAGNOSIS — Z9861 Coronary angioplasty status: Secondary | ICD-10-CM | POA: Diagnosis not present

## 2018-09-28 DIAGNOSIS — J9621 Acute and chronic respiratory failure with hypoxia: Secondary | ICD-10-CM | POA: Diagnosis not present

## 2018-09-28 DIAGNOSIS — I130 Hypertensive heart and chronic kidney disease with heart failure and stage 1 through stage 4 chronic kidney disease, or unspecified chronic kidney disease: Secondary | ICD-10-CM | POA: Diagnosis not present

## 2018-09-28 DIAGNOSIS — I2510 Atherosclerotic heart disease of native coronary artery without angina pectoris: Secondary | ICD-10-CM | POA: Diagnosis not present

## 2018-09-28 DIAGNOSIS — I5023 Acute on chronic systolic (congestive) heart failure: Secondary | ICD-10-CM | POA: Diagnosis not present

## 2018-09-28 DIAGNOSIS — I214 Non-ST elevation (NSTEMI) myocardial infarction: Secondary | ICD-10-CM | POA: Diagnosis not present

## 2018-10-02 ENCOUNTER — Ambulatory Visit (HOSPITAL_COMMUNITY)
Admission: RE | Admit: 2018-10-02 | Discharge: 2018-10-02 | Disposition: A | Discharge status: Home / Self Care | Payer: Medicare HMO | Source: Ambulatory Visit | Attending: Internal Medicine | Admitting: Internal Medicine

## 2018-10-02 ENCOUNTER — Other Ambulatory Visit: Payer: Self-pay

## 2018-10-02 DIAGNOSIS — N183 Chronic kidney disease, stage 3 (moderate): Secondary | ICD-10-CM | POA: Diagnosis not present

## 2018-10-02 DIAGNOSIS — I5022 Chronic systolic (congestive) heart failure: Secondary | ICD-10-CM

## 2018-10-02 DIAGNOSIS — I130 Hypertensive heart and chronic kidney disease with heart failure and stage 1 through stage 4 chronic kidney disease, or unspecified chronic kidney disease: Secondary | ICD-10-CM | POA: Diagnosis not present

## 2018-10-02 DIAGNOSIS — R001 Bradycardia, unspecified: Secondary | ICD-10-CM

## 2018-10-02 DIAGNOSIS — I5023 Acute on chronic systolic (congestive) heart failure: Secondary | ICD-10-CM | POA: Diagnosis not present

## 2018-10-02 DIAGNOSIS — J449 Chronic obstructive pulmonary disease, unspecified: Secondary | ICD-10-CM | POA: Diagnosis not present

## 2018-10-02 DIAGNOSIS — I2510 Atherosclerotic heart disease of native coronary artery without angina pectoris: Secondary | ICD-10-CM | POA: Diagnosis not present

## 2018-10-02 DIAGNOSIS — N184 Chronic kidney disease, stage 4 (severe): Secondary | ICD-10-CM | POA: Diagnosis not present

## 2018-10-02 DIAGNOSIS — J9621 Acute and chronic respiratory failure with hypoxia: Secondary | ICD-10-CM | POA: Diagnosis not present

## 2018-10-02 DIAGNOSIS — Z9861 Coronary angioplasty status: Secondary | ICD-10-CM | POA: Diagnosis not present

## 2018-10-02 DIAGNOSIS — E1122 Type 2 diabetes mellitus with diabetic chronic kidney disease: Secondary | ICD-10-CM | POA: Diagnosis not present

## 2018-10-02 DIAGNOSIS — I214 Non-ST elevation (NSTEMI) myocardial infarction: Secondary | ICD-10-CM | POA: Diagnosis not present

## 2018-10-02 NOTE — Progress Notes (Signed)
Heart Failure TeleHealth Note  Due to national recommendations of social distancing due to [place] 19, Audio/video telehealth visit is felt to be most appropriate for this patient at this time.  See MyChart message from today for patient consent regarding telehealth for [name].  Date:  10/02/2018   ID:  [name], [name] 21-May-1951, MRN [id number]  Location: Home  Provider location: [address], [place] Type of Visit: [place]  PCP:  [name], MD  Cardiologist:  No primary care provider on file. Primary HF: [place]  Chief Complaint:  Heart failure f/u   History of Present Illness: [name] is a 68 y.o. female who presents via audio/video conferencing for a telehealth visit today.     Admitted 08/06/2018 with increased shortness of breath and chest pain. Troponin 7.5>6.5>6.3 and BNP 1535. CXR was concerning for pulmonary edema so IV lasix was given. Taken to the cath lab on 2/10 but this was aborted due to PEA arrest. CPR started and epi was given with ROSC in 6-8 minutes. Due to failure to protect airway she was intubated. ECHO performed and showed reduced LVEF 20-25% and normal RV.Returned to the cath lab 08/10/2018 and had DES to 80% mLAD. RHC showed pulmonary htn and low output. Self-extubated 2/13 and weaned off milrinone 08/12/2018.   RHC  RA 10  PA 60/28 (43) PCWP 29 CO/Ci 4.0/2.4  Readmitted 3/21-24/20 with recurrent respiratory distress and ADHF in setting of dietary indiscretion and not using PRN lasix. Diuresed. D/c weight 121 pounds. Says breathing is much better. Taking medications regularly. Taking lasix 40mg  daily.  Weight down to 116. + orthopnea which is stable. No LE edema. No ab bloating. Able to ADLs "very easily." Stopped smoking.   Has [facility] RN following her on weekly basis. She was at house when I called and I spoke with her. Says patient doing well and getting back to normal weight. Ankle edema has resolved. Ankle circumference is  17-18cm which is baseline. Ab girth is also back to baseline. BP 120/70  Pulse 46. Typically 50-60  She denies symptoms of cough, fevers, chills, or new SOB worrisome for COVID 19.    Past Medical History:  Diagnosis Date  . COPD (chronic obstructive pulmonary disease) ([place])   . Diabetes mellitus without complication ([place])   . Hypertension   . NSTEMI (non-ST elevated myocardial infarction) [facility])    Past Surgical History:  Procedure Laterality Date  . CORONARY STENT INTERVENTION N/A 08/10/2018   Procedure: CORONARY STENT INTERVENTION;  Surgeon: Leonie Man, MD;  Location: [place] [facility];  Service: Cardiovascular;  Laterality: N/A;  . RIGHT/LEFT HEART CATH AND CORONARY ANGIOGRAPHY N/A 08/10/2018   Procedure: RIGHT/LEFT HEART CATH AND CORONARY ANGIOGRAPHY;  Surgeon: Leonie Man, MD;  Location: [place] [facility];  Service: Cardiovascular;  Laterality: N/A;     Current Outpatient Medications  Medication Sig Dispense Refill  . aspirin 81 MG chewable tablet Chew 1 tablet (81 mg total) by mouth daily. 30 tablet 0  . atorvastatin (LIPITOR) 40 MG tablet Take 1 tablet (40 mg total) by mouth daily at 6 PM. 30 tablet 0  . carvedilol (COREG) 3.125 MG tablet Take 1 tablet (3.125 mg total) by mouth 2 (two) times daily. 180 tablet 3  . digoxin (LANOXIN) 0.125 MG tablet Take 1 tablet (0.125 mg total) by mouth daily. 30 tablet 0  . furosemide (LASIX) 40 MG tablet Take 1 tablet (40 mg total) by mouth daily as needed  for fluid or edema (take one tab if 3 pound weight gain in 24 hours or 5 pound weight gain in 1 week). 30 tablet 11  . Insulin Glargine, 2 Unit Dial, (TOUJEO MAX SOLOSTAR) 300 UNIT/ML SOPN Inject 50 Units into the skin at bedtime.    Marland Kitchen losartan (COZAAR) 25 MG tablet Take 1 tablet (25 mg total) by mouth at bedtime. 30 tablet 0  . metFORMIN (GLUCOPHAGE) 1000 MG tablet Take 1,000 mg by mouth 2 (two) times daily with a meal.    . Multiple Vitamin (MULTIVITAMIN WITH MINERALS) TABS  tablet Take 1 tablet by mouth daily. 30 tablet 0  . pantoprazole (PROTONIX) 40 MG tablet Take 1 tablet (40 mg total) by mouth daily. 30 tablet 0  . spironolactone (ALDACTONE) 25 MG tablet Take 1 tablet (25 mg total) by mouth daily. 30 tablet 0  . ticagrelor (BRILINTA) 90 MG TABS tablet Take 1 tablet (90 mg total) by mouth 2 (two) times daily. 60 tablet 0   No current facility-administered medications for this encounter.     Allergies:   Patient has no known allergies.    ROS:  Please see the history of present illness.   All other systems are personally reviewed and negative.   Exam:  (Video/Tele Health Call; Exam is subjective and or/visual.) General:  Elderly. No dyspnea with conversation.  No LE edema or ab distensions per [facility] RN at home Neuro: Alert & oriented x 3.   Recent Labs: 09/16/2018: ALT 21; B Natriuretic Peptide 1,254.7 09/19/2018: BUN 23; Creatinine, Ser 2.05; Hemoglobin 12.5; Magnesium 1.2; Platelets 292; Potassium 4.8; Sodium 136  Personally reviewed   Wt Readings from Last 3 Encounters:  09/19/18 53 kg (116 lb 14.4 oz)  08/29/18 57 kg (125 lb 9.6 oz)  08/17/18 58.4 kg (128 lb 11.2 oz)     Other studies personally reviewed:   ASSESSMENT AND PLAN:  1. Chronic systolic heart failure - Suspect mixed ICM. NICM. S/p PCI 80% LAD on 12/15.  - ECHO 2/9 EF 20-25%  - Improved NYHA III symptoms currently.  - Volume status stable by weight and AHC exam.   - Continue lasix 40 mg daily. Check BMET - With bradycardia will stop digoxin and check level today - Continue spironolactone 25 mg daily - Continue losartan25mg  qhs.  - Continue low dose coreg 3.125 mg BID. May need to stop if bradycardia gets worse - Reinforced fluid restriction to < 2 L daily, sodium restriction to less than 2000 mg daily, and the importance of daily weights.    2. CAD s/p NSTEMI  - 08/10/18 LHC with DES to 80% mLAD - No s/s of ischemia.    - Continueticagrelor + ASA. No change.   3.  Pulmonary Hypertension  - Mixed pHTN. PVR 4.7. Not candidate for selective pulmonary artery vasodilators at this point.   4. COPD - She reports tobacco cessation  5. DMII - Per PCP - Hgb A 1 C 10.2during recent admission - Consider SGLT2i.   6. CKD 4 - Creatinine 1.8-2.0 at baseline.  - Check BMET today  7. Bradycardia - as above. Stop digoxin. (check level today to ensure no toxicity)  COVID screen The patient does not have any symptoms that suggest any further testing/ screening at this time.  Social distancing reinforced today.  Recommended follow-up:  2 weeks with telehealth visit  Relevant cardiac medications were reviewed at length with the patient today.   The patient does not have concerns regarding their medications at  this time.    Today, I have spent 25 minutes with the patient with telehealth technology discussing above .    Signed, Glori Bickers, MD  10/02/2018 11:39 AM  [facility] [address] Heart and [place] [address] [phone] (office) [phone] (fax)

## 2018-10-02 NOTE — Addendum Note (Signed)
Encounter addended by: Marlise Eves, RN on: 10/02/2018 1:02 PM  Actions taken: Order list changed, Medication long-term status modified, Clinical Note Signed

## 2018-10-02 NOTE — Patient Instructions (Addendum)
Lab work will be obtained by home health nurse. We will notify you of any abnormal lab work.  STOP Digoxin  Please follow up with Dr. Haroldine Laws in 2 weeks with a telehealth visit. This is scheduled for Tuesday April 21st at 10:00am

## 2018-10-03 ENCOUNTER — Telehealth (HOSPITAL_COMMUNITY): Payer: Self-pay | Admitting: *Deleted

## 2018-10-03 NOTE — Telephone Encounter (Signed)
Attempted to call pt in regards to Cardiac Rehab and when she anticipates no longer receiving in home care. Pt unavailable, message left for pt requesting call back.  Contact information provided. Cherre Huger, BSN Cardiac and Training and development officer

## 2018-10-09 DIAGNOSIS — E1122 Type 2 diabetes mellitus with diabetic chronic kidney disease: Secondary | ICD-10-CM | POA: Diagnosis not present

## 2018-10-09 DIAGNOSIS — J449 Chronic obstructive pulmonary disease, unspecified: Secondary | ICD-10-CM | POA: Diagnosis not present

## 2018-10-09 DIAGNOSIS — Z9861 Coronary angioplasty status: Secondary | ICD-10-CM | POA: Diagnosis not present

## 2018-10-09 DIAGNOSIS — N183 Chronic kidney disease, stage 3 (moderate): Secondary | ICD-10-CM | POA: Diagnosis not present

## 2018-10-09 DIAGNOSIS — I2510 Atherosclerotic heart disease of native coronary artery without angina pectoris: Secondary | ICD-10-CM | POA: Diagnosis not present

## 2018-10-09 DIAGNOSIS — I130 Hypertensive heart and chronic kidney disease with heart failure and stage 1 through stage 4 chronic kidney disease, or unspecified chronic kidney disease: Secondary | ICD-10-CM | POA: Diagnosis not present

## 2018-10-09 DIAGNOSIS — I214 Non-ST elevation (NSTEMI) myocardial infarction: Secondary | ICD-10-CM | POA: Diagnosis not present

## 2018-10-09 DIAGNOSIS — I5023 Acute on chronic systolic (congestive) heart failure: Secondary | ICD-10-CM | POA: Diagnosis not present

## 2018-10-09 DIAGNOSIS — J9621 Acute and chronic respiratory failure with hypoxia: Secondary | ICD-10-CM | POA: Diagnosis not present

## 2018-10-16 DIAGNOSIS — Z9861 Coronary angioplasty status: Secondary | ICD-10-CM | POA: Diagnosis not present

## 2018-10-16 DIAGNOSIS — I130 Hypertensive heart and chronic kidney disease with heart failure and stage 1 through stage 4 chronic kidney disease, or unspecified chronic kidney disease: Secondary | ICD-10-CM | POA: Diagnosis not present

## 2018-10-16 DIAGNOSIS — N183 Chronic kidney disease, stage 3 (moderate): Secondary | ICD-10-CM | POA: Diagnosis not present

## 2018-10-16 DIAGNOSIS — J449 Chronic obstructive pulmonary disease, unspecified: Secondary | ICD-10-CM | POA: Diagnosis not present

## 2018-10-16 DIAGNOSIS — I214 Non-ST elevation (NSTEMI) myocardial infarction: Secondary | ICD-10-CM | POA: Diagnosis not present

## 2018-10-16 DIAGNOSIS — E1122 Type 2 diabetes mellitus with diabetic chronic kidney disease: Secondary | ICD-10-CM | POA: Diagnosis not present

## 2018-10-16 DIAGNOSIS — I2510 Atherosclerotic heart disease of native coronary artery without angina pectoris: Secondary | ICD-10-CM | POA: Diagnosis not present

## 2018-10-16 DIAGNOSIS — I5023 Acute on chronic systolic (congestive) heart failure: Secondary | ICD-10-CM | POA: Diagnosis not present

## 2018-10-16 DIAGNOSIS — J9621 Acute and chronic respiratory failure with hypoxia: Secondary | ICD-10-CM | POA: Diagnosis not present

## 2018-10-17 ENCOUNTER — Ambulatory Visit (HOSPITAL_COMMUNITY)
Admission: RE | Admit: 2018-10-17 | Discharge: 2018-10-17 | Disposition: A | Discharge status: Home / Self Care | Payer: Medicare HMO | Source: Ambulatory Visit | Attending: Internal Medicine | Admitting: Internal Medicine

## 2018-10-17 ENCOUNTER — Telehealth: Payer: Self-pay

## 2018-10-17 ENCOUNTER — Other Ambulatory Visit: Payer: Self-pay

## 2018-10-17 DIAGNOSIS — R001 Bradycardia, unspecified: Secondary | ICD-10-CM

## 2018-10-17 DIAGNOSIS — I2510 Atherosclerotic heart disease of native coronary artery without angina pectoris: Secondary | ICD-10-CM

## 2018-10-17 DIAGNOSIS — I5022 Chronic systolic (congestive) heart failure: Secondary | ICD-10-CM

## 2018-10-17 NOTE — Progress Notes (Signed)
Heart Failure TeleHealth Note  Due to national recommendations of social distancing due to [place] 19, Audio/video telehealth visit is felt to be most appropriate for this patient at this time.  See MyChart message from today for patient consent regarding telehealth for [facility].  Date:  10/17/2018   ID:  [name], DOB [birth year], MRN [id number]  Location: Home  Provider location: [place] [facility] [facility] Type of Visit: Established patient  PCP:  [name], MD  Cardiologist:  No primary care provider on file. Primary HF: [place]  Chief Complaint: Heart Failure follow-up   History of Present Illness:  [name] is a 68 y.o. female who presents via audio/video conferencing for a telehealth visit today.     Admitted 2/9/2020with increased shortness of breath and chest pain. Troponin 7.5>6.5>6.3 and BNP 1535. CXR was concerning for pulmonary edema so IV lasix was given. Taken to the cath lab on 2/10 but this was aborted due to PEA arrest. CPR started and epi was given with ROSC in 6-8 minutes. Due to failure to protect airway she was intubated. ECHO performed and showed reduced LVEF 20-25% and normal RV.Returned to the cath lab 2/13/2020and had DES to 80% mLAD. RHC showed pulmonary venous HTN.Self-extubated 2/13 and weaned off milrinone 08/12/2018.   RHC  RA 10  PA 60/28 (43) PCWP 29 CO/Ci 4.0/2.4  Readmitted 3/21-24/20 with recurrent respiratory distress and ADHF in setting of dietary indiscretion and not using PRN lasix. Diuresed. D/c weight 121 pounds.   Had [facility] RN following her on weekly basis but ended yesterday. I had telehealth visit with her on 4/6 and was doing very well.   Volume status was good. HR was low in 40s so digoxin stopped. Weight was down to 116. Says she continues to do "great".  Says weight was 114. today,  BP yesterday was 98/56. No dizziness. No swelling, orthopnea or PND. Marland Kitchen    She denies symptoms of cough, fevers,  chills, or new SOB worrisome for COVID 19  Past Medical History:  Diagnosis Date  . COPD (chronic obstructive pulmonary disease) ([place])   . Diabetes mellitus without complication ([place])   . Hypertension   . NSTEMI (non-ST elevated myocardial infarction) [facility])    Past Surgical History:  Procedure Laterality Date  . CORONARY STENT INTERVENTION N/A 08/10/2018   Procedure: CORONARY STENT INTERVENTION;  Surgeon: Leonie Man, MD;  Location: [place] [facility];  Service: Cardiovascular;  Laterality: N/A;  . RIGHT/LEFT HEART CATH AND CORONARY ANGIOGRAPHY N/A 08/10/2018   Procedure: RIGHT/LEFT HEART CATH AND CORONARY ANGIOGRAPHY;  Surgeon: Leonie Man, MD;  Location: [place] [facility];  Service: Cardiovascular;  Laterality: N/A;     Current Outpatient Medications  Medication Sig Dispense Refill  . aspirin 81 MG chewable tablet Chew 1 tablet (81 mg total) by mouth daily. 30 tablet 0  . atorvastatin (LIPITOR) 40 MG tablet Take 1 tablet (40 mg total) by mouth daily at 6 PM. 30 tablet 0  . carvedilol (COREG) 3.125 MG tablet Take 1 tablet (3.125 mg total) by mouth 2 (two) times daily. 180 tablet 3  . furosemide (LASIX) 40 MG tablet Take 1 tablet (40 mg total) by mouth daily as needed for fluid or edema (take one tab if 3 pound weight gain in 24 hours or 5 pound weight gain in 1 week). 30 tablet 11  . Insulin Glargine, 2 Unit Dial, (TOUJEO MAX SOLOSTAR) 300 UNIT/ML SOPN Inject 50 Units into the skin  at bedtime.    Marland Kitchen losartan (COZAAR) 25 MG tablet Take 1 tablet (25 mg total) by mouth at bedtime. 30 tablet 0  . metFORMIN (GLUCOPHAGE) 1000 MG tablet Take 1,000 mg by mouth 2 (two) times daily with a meal.    . Multiple Vitamin (MULTIVITAMIN WITH MINERALS) TABS tablet Take 1 tablet by mouth daily. 30 tablet 0  . pantoprazole (PROTONIX) 40 MG tablet Take 1 tablet (40 mg total) by mouth daily. 30 tablet 0  . spironolactone (ALDACTONE) 25 MG tablet Take 1 tablet (25 mg total) by mouth daily. 30 tablet 0   . ticagrelor (BRILINTA) 90 MG TABS tablet Take 1 tablet (90 mg total) by mouth 2 (two) times daily. 60 tablet 0   No current facility-administered medications for this encounter.     Allergies:   Patient has no known allergies.   Social History:  The patient  reports that she has been smoking. She has been smoking about 1.00 pack per day. She has never used smokeless tobacco. She reports current alcohol use. She reports that she does not use drugs.   Family History:  The patient's family history is not on file.   ROS:  Please see the history of present illness.   All other systems are personally reviewed and negative.   Exam:  (Video/Tele Health Call; Exam is subjective and or/visual.) General:  Speaks in full sentences. No resp difficulty. Lungs: Normal respiratory effort with conversation.  Abdomen: Non-distended per patient report Extremities: Pt denies edema. Neuro: Alert & oriented x 3.   Recent Labs: 09/16/2018: ALT 21; B Natriuretic Peptide 1,254.7 09/19/2018: BUN 23; Creatinine, Ser 2.05; Hemoglobin 12.5; Magnesium 1.2; Platelets 292; Potassium 4.8; Sodium 136  Personally reviewed   Wt Readings from Last 3 Encounters:  09/19/18 53 kg (116 lb 14.4 oz)  08/29/18 57 kg (125 lb 9.6 oz)  08/17/18 58.4 kg (128 lb 11.2 oz)      ASSESSMENT AND PLAN:  1.Chronicsystolicheart failure - Suspect mixed ICM. NICM. S/p PCI 80% LAD on 12/15. -ECHO 2/9 EF 20-25% - Stable NYHAIII symptoms currently. - Volume statusstable by weight and her report - Continue to take lasix 40 on PORN basis (has not needed) Recent labs ok  - Off digoxin due to bradycardia - Continue spironolactone 25 mg daily. Labs ok - Continue losartan25mg  qhs.BP too low to titrate or switch to Entresto - Continue low dose coreg 3.125 mg BID. May need to stop if bradycardia gets worse -Reinforced fluid restriction to < 2 L daily, sodium restriction to less than 2000 mg daily, and the importance of daily  weights. - Will have RN home visit with Vanita Ingles next week  2.CAD s/pNSTEMI  - 08/10/18 LHC with DES to 80% mLAD -No s/s of ischemia - Continueticagrelor + ASA.No change.No bleeding  3. Pulmonary Hypertension  - Mixed pHTN. PVR 4.7. Not candidate for selective pulmonary artery vasodilators at this point.   4. COPD - She reports tobacco cessation  5. DMII -Per PCP - Hgb A 1 C 10.2during recent admission - Consider SGLT2i.  6. CKD 4 - Creatinine 1.8-2.0 at baseline.  - Creatinine checked 4/20 was 1.13 K 4.3   7. Bradycardia - as above. Digoxin stopped. Recheck HR on RN visit next week with [employer] monitor.   COVID screen The patient does not have any symptoms that suggest any further testing/ screening at this time.  Social distancing reinforced today.  Recommended follow-up:  As above  Relevant cardiac medications were reviewed at  length with the patient today.   The patient does not have concerns regarding their medications at this time.   The following changes were made today:  As above  Today, I have spent 20 minutes with the patient with telehealth technology discussing the above issues .    Signed, Glori Bickers, MD  10/17/2018 12:25 PM  [facility] [address] Heart and [place] [address] [phone] (office) [phone] (fax)

## 2018-10-17 NOTE — Addendum Note (Signed)
Encounter addended by: Scarlette Calico, RN on: 10/17/2018 3:03 PM  Actions taken: Order list changed, Diagnosis association updated, Clinical Note Signed

## 2018-10-17 NOTE — Progress Notes (Signed)
Attempted to call pt to sch next appointment however there was no answer.  AVS mailed to pt, referral placed to Vanita Ingles, RN w/[facility] for VS and EKG.  Message sent to schedulers to sch next appt in 3-4 weeks with Dr Haroldine Laws.

## 2018-10-17 NOTE — Telephone Encounter (Signed)
Left VM for [name] to call back in regards to a referral for home visit to obtain vital signs and ekg reading.

## 2018-10-17 NOTE — Patient Instructions (Signed)
Please continue current medications  Please call our office at [phone] if you have any questions or needs before your next appointment  Our office will call you to scheduled your next appointment in 3-4 weeks

## 2018-10-17 NOTE — Addendum Note (Signed)
Encounter addended by: Scarlette Calico, RN on: 10/17/2018 3:06 PM  Actions taken: Clinical Note Signed

## 2018-10-18 ENCOUNTER — Telehealth: Payer: Self-pay

## 2018-10-18 DIAGNOSIS — I10 Essential (primary) hypertension: Secondary | ICD-10-CM | POA: Diagnosis not present

## 2018-10-18 DIAGNOSIS — I5023 Acute on chronic systolic (congestive) heart failure: Secondary | ICD-10-CM | POA: Diagnosis not present

## 2018-10-18 DIAGNOSIS — Z87891 Personal history of nicotine dependence: Secondary | ICD-10-CM | POA: Diagnosis not present

## 2018-10-18 DIAGNOSIS — I2510 Atherosclerotic heart disease of native coronary artery without angina pectoris: Secondary | ICD-10-CM | POA: Diagnosis not present

## 2018-10-18 DIAGNOSIS — E119 Type 2 diabetes mellitus without complications: Secondary | ICD-10-CM | POA: Diagnosis not present

## 2018-10-18 DIAGNOSIS — I130 Hypertensive heart and chronic kidney disease with heart failure and stage 1 through stage 4 chronic kidney disease, or unspecified chronic kidney disease: Secondary | ICD-10-CM | POA: Diagnosis not present

## 2018-10-18 DIAGNOSIS — E1122 Type 2 diabetes mellitus with diabetic chronic kidney disease: Secondary | ICD-10-CM | POA: Diagnosis not present

## 2018-10-18 DIAGNOSIS — R634 Abnormal weight loss: Secondary | ICD-10-CM | POA: Diagnosis not present

## 2018-10-18 NOTE — Telephone Encounter (Signed)
Arranged home visit appointment for tomorrow 10/19/18 around 2pm w/ Ms. Tidd to obtain an EKG and vitals as requested by the HF clinic.

## 2018-10-19 ENCOUNTER — Telehealth (HOSPITAL_COMMUNITY): Payer: Self-pay | Admitting: Cardiology

## 2018-10-19 NOTE — Telephone Encounter (Signed)
Received call from Vanita Ingles, RN with [facility]. She was asked to do a home visit to check pt's rhythm and vitals.   One lead rhythm strip shows NSR 69 bpm. BP 90/60, O2 98% on RA, weight 113 lbs. Feeling well. Of note, she took a decongestant with phenylephrine for sinus headache this afternoon. Advised that she not take decongestants in the future. Sent a photo of OTC meds that are okay to use and that are not okay to use to [facility] for review with patient.   She has a home BP cuff that checks her pulse rate and she will continue to monitor daily and will let us know if HR <50. No medication changes at this time.  Georgiana Shore, NP

## 2018-10-19 NOTE — Progress Notes (Signed)
[name]     DOB: 03-Jun-1951  Purpose of Visit: Vitals, EKG  HF provider: Aundra Dubin  Medications: Is the patient taking all medications listed on MAR from Epic? Yes  List any medications that are not being taken correctly: Has taken OTC "sinus & headache" w/phenylephrine about 1.5 hrs prior to the visit for sinus congestion stating it had resolved.  Texted Ms. Lacson and her DIL a picture of approved and unapproved OTC medications for HF/CAD patients and they state understanding.    List any medication refills needed: n/a  Is the patient able to pick up medications? Yes  Vitals: BP:  90/62  HR:70  Oxygen: 98%RA Weight: 113.3lbs        Physical Exam: Circle, add more if applicable.  Lung sounds: clear  Heart sounds: regular  Peripheral edema: no  Wounds: no  Location:  Any patient concerns? None other than the sinus congestion and is aware of which OTC meds are safe for her to take and those that are unsafe for her to take.  She has also been informed to continue to take her daily BP/pulse and call the HF clinic if her HR <50 or if symptomatic or with any concerns.   ReDS Vest/Clip Reading: n/a  Rhythm Strip: 70 SR  Is Home Health recommended? No If yes, state reason:   Vanita Ingles, RN 10/19/18

## 2018-10-26 ENCOUNTER — Telehealth (HOSPITAL_COMMUNITY): Payer: Self-pay | Admitting: *Deleted

## 2018-10-26 NOTE — Telephone Encounter (Signed)
Called and left message for pt to contact for interest in participating in CR when we are permitted to schedule.  Contact information provided.  Will also send please contact letter.  We have not been able to reach pt by phone. Cherre Huger, BSN Cardiac and Training and development officer

## 2018-11-10 ENCOUNTER — Telehealth (HOSPITAL_COMMUNITY): Payer: Self-pay | Admitting: Vascular Surgery

## 2018-11-10 ENCOUNTER — Other Ambulatory Visit: Payer: Self-pay

## 2018-11-10 ENCOUNTER — Ambulatory Visit (HOSPITAL_COMMUNITY)
Admission: RE | Admit: 2018-11-10 | Discharge: 2018-11-10 | Disposition: A | Discharge status: Home / Self Care | Payer: Medicare HMO | Source: Ambulatory Visit | Attending: Internal Medicine | Admitting: Internal Medicine

## 2018-11-10 VITALS — Wt 116.0 lb

## 2018-11-10 DIAGNOSIS — I5022 Chronic systolic (congestive) heart failure: Secondary | ICD-10-CM | POA: Diagnosis not present

## 2018-11-10 DIAGNOSIS — R001 Bradycardia, unspecified: Secondary | ICD-10-CM

## 2018-11-10 DIAGNOSIS — I2510 Atherosclerotic heart disease of native coronary artery without angina pectoris: Secondary | ICD-10-CM

## 2018-11-10 DIAGNOSIS — N184 Chronic kidney disease, stage 4 (severe): Secondary | ICD-10-CM

## 2018-11-10 MED ORDER — MAGNESIUM OXIDE -MG SUPPLEMENT 400 (240 MG) MG PO TABS: 400.0000 mg | ORAL_TABLET | Freq: Every day | ORAL | 2 refills | Status: DC

## 2018-11-10 NOTE — Progress Notes (Signed)
Heart Failure TeleHealth Note  Due to national recommendations of social distancing due to [place] 19, telehealth visit is felt to be most appropriate for this patient at this time.  I discussed the limitations, risks, security and privacy concerns of performing an evaluation and management service by telephone and the availability of in person appointments. I also discussed with the patient that there may be a patient responsible charge related to this service. The patient expressed understanding and agreed to proceed.   [name], [name] 05-Jun-1951, MRN [id number]  Location: Home  Provider location: [address] [place] Type of Visit: Established patient   PCP:  [name], MD  Cardiologist:  No primary care provider on file. Primary HF: Dr Haroldine Laws  Chief Complaint: HF follow up   History of Present Illness: [name] is a 68 y.o. female with a history of  DMII, HTN, former tobacco use, CAD, h/o PEA arrest, chronic systolic HF  Admitted 7/6/2831DVVO increased shortness of breath and chest pain. Troponin 7.5>6.5>6.3 and BNP 1535. CXR was concerning for pulmonary edema so IV lasix was given. Taken to the cath lab on 2/10 but this was aborted due to PEA arrest. CPR started and epi was given with ROSC in 6-8 minutes. Due to failure to protect airway she was intubated. ECHO performed and showed reduced LVEF 20-25% and normal RV. Returned to the cath lab 2/13/2020and had DES to80% mLAD. RHC showed pulmonary venous HTN.Self-extubated 2/13 and weaned off milrinone 08/12/2018.  RHC RA 10  PA 60/28 (43) PCWP 29 CO/Ci 4.0/2.4  Readmitted 3/21-24/20 with recurrent respiratory distress and ADHF in setting of dietary indiscretion and not using PRN lasix. Diuresed. D/c weight 121 pounds.   Patient presents via audio conferencing for a telehealth visit today. She had a [facility] home RN visit a few weeks ago and rhythm strip showed NSR 69 bpm. Overall doing well. HR has  been running 70-80s. BP 90-100s. She remains SOB on exertion walking around the house. She has some edema in feet. No cough, orthopnea, or PND. No dizziness or CP. Energy level poor chronically. Weight 114-116 lbs. Does not eat much. Taking all medications every day. No problems with food insecurity. Has not taken PRN lasix.   Pt denies symptoms of cough, fevers, chills, or new SOB worrisome for COVID 19.    Past Medical History:  Diagnosis Date   COPD (chronic obstructive pulmonary disease) ([place])    Diabetes mellitus without complication ([place])    Hypertension    NSTEMI (non-ST elevated myocardial infarction) [facility])    Past Surgical History:  Procedure Laterality Date   CORONARY STENT INTERVENTION N/A 08/10/2018   Procedure: CORONARY STENT INTERVENTION;  Surgeon: Leonie Man, MD;  Location: [place] [facility];  Service: Cardiovascular;  Laterality: N/A;   RIGHT/LEFT HEART CATH AND CORONARY ANGIOGRAPHY N/A 08/10/2018   Procedure: RIGHT/LEFT HEART CATH AND CORONARY ANGIOGRAPHY;  Surgeon: Leonie Man, MD;  Location: [place] [facility];  Service: Cardiovascular;  Laterality: N/A;     Current Outpatient Medications  Medication Sig Dispense Refill   aspirin 81 MG chewable tablet Chew 1 tablet (81 mg total) by mouth daily. 30 tablet 0   atorvastatin (LIPITOR) 40 MG tablet Take 1 tablet (40 mg total) by mouth daily at 6 PM. 30 tablet 0   carvedilol (COREG) 3.125 MG tablet Take 1 tablet (3.125 mg total) by mouth 2 (two) times daily. 180 tablet 3   losartan (COZAAR) 25 MG  tablet Take 1 tablet (25 mg total) by mouth at bedtime. 30 tablet 0   metFORMIN (GLUCOPHAGE) 1000 MG tablet Take 1,000 mg by mouth 2 (two) times daily with a meal.     Multiple Vitamin (MULTIVITAMIN WITH MINERALS) TABS tablet Take 1 tablet by mouth daily. 30 tablet 0   pantoprazole (PROTONIX) 40 MG tablet Take 1 tablet (40 mg total) by mouth daily. 30 tablet 0   spironolactone (ALDACTONE) 25 MG tablet Take 1  tablet (25 mg total) by mouth daily. 30 tablet 0   ticagrelor (BRILINTA) 90 MG TABS tablet Take 1 tablet (90 mg total) by mouth 2 (two) times daily. 60 tablet 0   furosemide (LASIX) 40 MG tablet Take 1 tablet (40 mg total) by mouth daily as needed for fluid or edema (take one tab if 3 pound weight gain in 24 hours or 5 pound weight gain in 1 week). (Patient not taking: Reported on 11/10/2018) 30 tablet 11   Insulin Glargine, 2 Unit Dial, (TOUJEO MAX SOLOSTAR) 300 UNIT/ML SOPN Inject 50 Units into the skin at bedtime.     No current facility-administered medications for this encounter.     Allergies:   Patient has no known allergies.   Social History:  The patient  reports that she has been smoking. She has been smoking about 1.00 pack per day. She has never used smokeless tobacco. She reports current alcohol use. She reports that she does not use drugs.   Family History:  The patient's family history is not on file.   ROS:  Please see the history of present illness.   All other systems are personally reviewed and negative.    Exam:  (Video/Tele Health Call; Exam is subjective and or/visual.) General:  Speaks in full sentences. No resp difficulty. Lungs: Normal respiratory effort with conversation.  Abdomen: No distension per patient report Extremities: +edema in feet.  Neuro: Alert & oriented x 3.   Recent Labs: 09/16/2018: ALT 21; B Natriuretic Peptide 1,254.7 09/19/2018: BUN 23; Creatinine, Ser 2.05; Hemoglobin 12.5; Magnesium 1.2; Platelets 292; Potassium 4.8; Sodium 136  Personally reviewed   Wt Readings from Last 3 Encounters:  11/10/18 52.6 kg (116 lb)  10/19/18 51.4 kg (113 lb 4.8 oz)  09/19/18 53 kg (116 lb 14.4 oz)      ASSESSMENT AND PLAN:  1.Chronicsystolicheart failure - Suspect mixed ICM. NICM. S/p PCI 80% LAD on 12/15. -ECHO 08/06/18 EF 20-25% -Stable NYHAIII symptoms currently. - Volume status sounds stable, maybe mildly elevated. - Continue to take  lasix 40 on PRN basis (has not needed). Discussed s/s to take this.  -Off digoxin due to bradycardia - Continue spironolactone 25 mg daily.  - Continue losartan25mg  qhs.BP too low to titrate or switch to Entresto  -Continuelow dose coreg 3.125 mg BID. -Reinforced fluid restriction to < 2 L daily, sodium restriction to less than 2000 mg daily, and the importance of daily weights. - Repeat echo next visit.   2.CAD s/pNSTEMI  - 08/10/18 LHC with DES to 80% mLAD - Continueticagrelor + ASA.Denies bleeding - No s/s ischemia  3. Pulmonary Hypertension  - Mixed pHTN. PVR 4.7. Not candidate for selective pulmonary artery vasodilators at this point.  4. COPD -She reports tobacco cessation. No change.   5. DMII -Per PCP - Hgb A1C 10.2during recent admission - Consider SGLT2i.  6. CKD 4 - Creatinine 1.8-2.0 at baseline.  - Creatinine 2.05 on last labs 3/24  7. Bradycardia - Rhythm strip by [facility] RN showed NSR  69 bpm. Now off digoxin. Tolerating coreg okay.   8. Hypomangesium - Mag 1.2 on labs 3/24. Start magox 400 mg daily   COVID screen The patient does not have any symptoms that suggest any further testing/ screening at this time.  Social distancing reinforced today.  Patient Risk: After full review of this patients clinical status, I feel that they are at moderate risk for cardiac decompensation at this time.  Orders/Follow up: Start magox 400 mg daily. Follow up in 6 weeks with echo with Dr Haroldine Laws  Today, I have spent 10 minutes with the patient with telehealth technology discussing CHF and sliding scale diuretics.    Signed, Georgiana Shore, NP  11/10/2018 1:21 PM   [facility] [address] Heart and [place] [address] [phone] (office) [phone] (fax)

## 2018-11-10 NOTE — Telephone Encounter (Signed)
Left pt message to make 6-8 week f/u w/ db w/ echo

## 2018-11-10 NOTE — Progress Notes (Signed)
Spoke w/pt via phone.  She is aware to start mag-ox 400 mg daily, rx sent to [facility].  Message sent to schedulers to arrange next appt and echo.

## 2018-11-10 NOTE — Addendum Note (Signed)
Encounter addended by: Scarlette Calico, RN on: 11/10/2018 2:23 PM  Actions taken: Pharmacy for encounter modified, Order list changed, Diagnosis association updated, Clinical Note Signed

## 2018-11-10 NOTE — Patient Instructions (Signed)
Start Mag-ox 400 mg daily  Your physician recommends that you schedule a follow-up appointment in: 6-8 weeks with echocardiogram

## 2018-11-21 ENCOUNTER — Other Ambulatory Visit: Payer: Self-pay

## 2018-11-21 ENCOUNTER — Other Ambulatory Visit: Payer: Self-pay | Admitting: Family Medicine

## 2018-11-21 ENCOUNTER — Ambulatory Visit
Admission: RE | Admit: 2018-11-21 | Discharge: 2018-11-21 | Disposition: A | Discharge status: Home / Self Care | Payer: Medicare HMO | Source: Ambulatory Visit | Attending: Family Medicine | Admitting: Family Medicine

## 2018-11-21 DIAGNOSIS — I519 Heart disease, unspecified: Secondary | ICD-10-CM

## 2018-11-21 DIAGNOSIS — R0602 Shortness of breath: Secondary | ICD-10-CM

## 2018-11-23 ENCOUNTER — Telehealth: Payer: Self-pay

## 2018-11-23 ENCOUNTER — Telehealth (HOSPITAL_COMMUNITY): Payer: Self-pay

## 2018-11-23 DIAGNOSIS — I5022 Chronic systolic (congestive) heart failure: Secondary | ICD-10-CM

## 2018-11-23 NOTE — Telephone Encounter (Signed)
Spoke with patients wife, she confirms patient does not take lasix daily.  She has it as needed. D/w Dr Haroldine Laws, pt should take 80mg  lasix today and tomorrow.  Labs to be drawn in the morning by RN Vanita Ingles and patient to have telehealth visit tomorrow afternoon.

## 2018-11-23 NOTE — Telephone Encounter (Signed)
Pt wife reports she has been experiencing worsening SOB since beginning of week.  SOB is present regardless of activity, cannot lay down.  Went to her primary dr on Tuesday and did chest xray that revealed that she had fluid on her lungs. Pt was instructed to take magnesium at double dose.  Asked if she was sure if this was the medicine she was told to double and she reports yes.   Pt has been monitoring diet closely.  Will forward to doctor to advise.

## 2018-11-23 NOTE — Telephone Encounter (Signed)
Spoke to Ms. Wayne about home visit request by Dr. Haroldine Laws for early tomorrow for labs and assessment.  Ms. Polidore asking for me to come around 11am which I've agreed upon.

## 2018-11-23 NOTE — Telephone Encounter (Signed)
Double diuretic for 2 days. Give one dose of metolazone 2.5 with 20 k today if possible.  Have debbie Reynold [facility]. Televisit tomorrow afternoon with me or amy.

## 2018-11-24 ENCOUNTER — Emergency Department (HOSPITAL_COMMUNITY): Payer: Medicare HMO

## 2018-11-24 ENCOUNTER — Other Ambulatory Visit (HOSPITAL_COMMUNITY): Payer: Medicare HMO

## 2018-11-24 ENCOUNTER — Ambulatory Visit (HOSPITAL_COMMUNITY)
Admission: RE | Admit: 2018-11-24 | Discharge: 2018-11-24 | Disposition: A | Discharge status: Home / Self Care | Payer: Medicare HMO | Source: Ambulatory Visit | Attending: Cardiology | Admitting: Cardiology

## 2018-11-24 ENCOUNTER — Inpatient Hospital Stay (HOSPITAL_COMMUNITY)
Admission: EM | Admit: 2018-11-24 | Discharge: 2018-12-05 | DRG: 286 | Disposition: A | Discharge status: Home Health | Payer: Medicare HMO | Attending: Internal Medicine | Admitting: Internal Medicine

## 2018-11-24 ENCOUNTER — Encounter (HOSPITAL_COMMUNITY): Payer: Self-pay | Admitting: Emergency Medicine

## 2018-11-24 ENCOUNTER — Other Ambulatory Visit: Payer: Self-pay

## 2018-11-24 ENCOUNTER — Ambulatory Visit (HOSPITAL_BASED_OUTPATIENT_CLINIC_OR_DEPARTMENT_OTHER)
Admission: RE | Admit: 2018-11-24 | Discharge: 2018-11-24 | Disposition: A | Discharge status: Home / Self Care | Payer: Medicare HMO | Source: Ambulatory Visit | Attending: Internal Medicine | Admitting: Internal Medicine

## 2018-11-24 DIAGNOSIS — B379 Candidiasis, unspecified: Secondary | ICD-10-CM | POA: Diagnosis present

## 2018-11-24 DIAGNOSIS — N179 Acute kidney failure, unspecified: Secondary | ICD-10-CM | POA: Diagnosis present

## 2018-11-24 DIAGNOSIS — I2510 Atherosclerotic heart disease of native coronary artery without angina pectoris: Secondary | ICD-10-CM | POA: Diagnosis not present

## 2018-11-24 DIAGNOSIS — Z7902 Long term (current) use of antithrombotics/antiplatelets: Secondary | ICD-10-CM

## 2018-11-24 DIAGNOSIS — I5021 Acute systolic (congestive) heart failure: Secondary | ICD-10-CM | POA: Diagnosis not present

## 2018-11-24 DIAGNOSIS — E119 Type 2 diabetes mellitus without complications: Secondary | ICD-10-CM

## 2018-11-24 DIAGNOSIS — I130 Hypertensive heart and chronic kidney disease with heart failure and stage 1 through stage 4 chronic kidney disease, or unspecified chronic kidney disease: Principal | ICD-10-CM | POA: Diagnosis present

## 2018-11-24 DIAGNOSIS — Z79899 Other long term (current) drug therapy: Secondary | ICD-10-CM | POA: Insufficient documentation

## 2018-11-24 DIAGNOSIS — I5023 Acute on chronic systolic (congestive) heart failure: Secondary | ICD-10-CM | POA: Diagnosis present

## 2018-11-24 DIAGNOSIS — I5022 Chronic systolic (congestive) heart failure: Secondary | ICD-10-CM | POA: Insufficient documentation

## 2018-11-24 DIAGNOSIS — I255 Ischemic cardiomyopathy: Secondary | ICD-10-CM | POA: Diagnosis present

## 2018-11-24 DIAGNOSIS — I472 Ventricular tachycardia: Secondary | ICD-10-CM | POA: Diagnosis present

## 2018-11-24 DIAGNOSIS — E876 Hypokalemia: Secondary | ICD-10-CM | POA: Diagnosis present

## 2018-11-24 DIAGNOSIS — D649 Anemia, unspecified: Secondary | ICD-10-CM | POA: Diagnosis present

## 2018-11-24 DIAGNOSIS — N184 Chronic kidney disease, stage 4 (severe): Secondary | ICD-10-CM | POA: Diagnosis present

## 2018-11-24 DIAGNOSIS — J9610 Chronic respiratory failure, unspecified whether with hypoxia or hypercapnia: Secondary | ICD-10-CM | POA: Diagnosis present

## 2018-11-24 DIAGNOSIS — R001 Bradycardia, unspecified: Secondary | ICD-10-CM | POA: Insufficient documentation

## 2018-11-24 DIAGNOSIS — I340 Nonrheumatic mitral (valve) insufficiency: Secondary | ICD-10-CM | POA: Diagnosis not present

## 2018-11-24 DIAGNOSIS — J449 Chronic obstructive pulmonary disease, unspecified: Secondary | ICD-10-CM | POA: Insufficient documentation

## 2018-11-24 DIAGNOSIS — I252 Old myocardial infarction: Secondary | ICD-10-CM | POA: Insufficient documentation

## 2018-11-24 DIAGNOSIS — K219 Gastro-esophageal reflux disease without esophagitis: Secondary | ICD-10-CM | POA: Diagnosis present

## 2018-11-24 DIAGNOSIS — I428 Other cardiomyopathies: Secondary | ICD-10-CM | POA: Insufficient documentation

## 2018-11-24 DIAGNOSIS — Z20828 Contact with and (suspected) exposure to other viral communicable diseases: Secondary | ICD-10-CM | POA: Diagnosis present

## 2018-11-24 DIAGNOSIS — I361 Nonrheumatic tricuspid (valve) insufficiency: Secondary | ICD-10-CM | POA: Diagnosis not present

## 2018-11-24 DIAGNOSIS — B370 Candidal stomatitis: Secondary | ICD-10-CM | POA: Diagnosis not present

## 2018-11-24 DIAGNOSIS — Z7982 Long term (current) use of aspirin: Secondary | ICD-10-CM

## 2018-11-24 DIAGNOSIS — I272 Pulmonary hypertension, unspecified: Secondary | ICD-10-CM | POA: Diagnosis present

## 2018-11-24 DIAGNOSIS — Z794 Long term (current) use of insulin: Secondary | ICD-10-CM | POA: Diagnosis not present

## 2018-11-24 DIAGNOSIS — I959 Hypotension, unspecified: Secondary | ICD-10-CM | POA: Diagnosis present

## 2018-11-24 DIAGNOSIS — E872 Acidosis: Secondary | ICD-10-CM | POA: Diagnosis present

## 2018-11-24 DIAGNOSIS — N183 Chronic kidney disease, stage 3 unspecified: Secondary | ICD-10-CM | POA: Diagnosis present

## 2018-11-24 DIAGNOSIS — E1122 Type 2 diabetes mellitus with diabetic chronic kidney disease: Secondary | ICD-10-CM | POA: Insufficient documentation

## 2018-11-24 DIAGNOSIS — Z955 Presence of coronary angioplasty implant and graft: Secondary | ICD-10-CM

## 2018-11-24 DIAGNOSIS — K5900 Constipation, unspecified: Secondary | ICD-10-CM | POA: Diagnosis present

## 2018-11-24 DIAGNOSIS — J410 Simple chronic bronchitis: Secondary | ICD-10-CM | POA: Diagnosis not present

## 2018-11-24 DIAGNOSIS — I509 Heart failure, unspecified: Secondary | ICD-10-CM

## 2018-11-24 DIAGNOSIS — F17210 Nicotine dependence, cigarettes, uncomplicated: Secondary | ICD-10-CM | POA: Insufficient documentation

## 2018-11-24 HISTORY — DX: Heart failure, unspecified: I50.9

## 2018-11-24 LAB — BASIC METABOLIC PANEL
Anion gap: 12 (ref 5–15)
Anion gap: 16 — ABNORMAL HIGH (ref 5–15)
BUN: 70 mg/dL — ABNORMAL HIGH (ref 8–23)
BUN: 72 mg/dL — ABNORMAL HIGH (ref 8–23)
CO2: 13 mmol/L — ABNORMAL LOW (ref 22–32)
CO2: 10 mmol/L — ABNORMAL LOW (ref 22–32)
Calcium: 9.1 mg/dL (ref 8.9–10.3)
Calcium: 9.5 mg/dL (ref 8.9–10.3)
Chloride: 113 mmol/L — ABNORMAL HIGH (ref 98–111)
Chloride: 114 mmol/L — ABNORMAL HIGH (ref 98–111)
Creatinine, Ser: 4.13 mg/dL — ABNORMAL HIGH (ref 0.44–1.00)
Creatinine, Ser: 4.38 mg/dL — ABNORMAL HIGH (ref 0.44–1.00)
GFR calc Af Amer: 12 mL/min — ABNORMAL LOW (ref >60)
GFR calc Af Amer: 11 mL/min — ABNORMAL LOW (ref >60)
GFR calc non Af Amer: 10 mL/min — ABNORMAL LOW (ref >60)
GFR calc non Af Amer: 10 mL/min — ABNORMAL LOW (ref >60)
Glucose, Bld: 132 mg/dL — ABNORMAL HIGH (ref 70–99)
Glucose, Bld: 116 mg/dL — ABNORMAL HIGH (ref 70–99)
Potassium: 4.4 mmol/L (ref 3.5–5.1)
Potassium: 4.9 mmol/L (ref 3.5–5.1)
Sodium: 138 mmol/L (ref 135–145)
Sodium: 140 mmol/L (ref 135–145)

## 2018-11-24 LAB — CBC
HCT: 32.4 % — ABNORMAL LOW (ref 36.0–46.0)
Hemoglobin: 10.1 g/dL — ABNORMAL LOW (ref 12.0–15.0)
MCH: 27 pg (ref 26.0–34.0)
MCHC: 31.2 g/dL (ref 30.0–36.0)
MCV: 86.6 fL (ref 80.0–100.0)
Platelets: 249 10*3/uL (ref 150–400)
RBC: 3.74 MIL/uL — ABNORMAL LOW (ref 3.87–5.11)
RDW: 18.6 % — ABNORMAL HIGH (ref 11.5–15.5)
WBC: 7.1 10*3/uL (ref 4.0–10.5)
nRBC: 1.4 % — ABNORMAL HIGH (ref 0.0–0.2)

## 2018-11-24 LAB — LACTIC ACID, PLASMA: Lactic Acid, Venous: 2 mmol/L (ref 0.5–1.9)

## 2018-11-24 LAB — BRAIN NATRIURETIC PEPTIDE: B Natriuretic Peptide: 4046.9 pg/mL — ABNORMAL HIGH (ref 0.0–100.0)

## 2018-11-24 LAB — TROPONIN I: Troponin I: 0.03 ng/mL (ref <0.03)

## 2018-11-24 LAB — SARS CORONAVIRUS 2 BY RT PCR (HOSPITAL ORDER, PERFORMED IN ~~LOC~~ HOSPITAL LAB): SARS Coronavirus 2: NEGATIVE

## 2018-11-24 MED ORDER — SODIUM CHLORIDE 0.9% FLUSH: 3.0000 mL | Freq: Once | INTRAVENOUS | Status: DC

## 2018-11-24 MED ORDER — DOBUTAMINE IN D5W 4-5 MG/ML-% IV SOLN
2.5000 ug/kg/min | INTRAVENOUS | Status: DC
  Administered 2018-11-24 – 2018-11-29 (×2): 2.5 ug/kg/min via INTRAVENOUS
  Filled 2018-11-24 (×3): qty 250

## 2018-11-24 MED ORDER — SODIUM CHLORIDE 0.9 % IV SOLN: Freq: Once | INTRAVENOUS | Status: DC

## 2018-11-24 NOTE — Addendum Note (Signed)
Encounter addended by: Scarlette Calico, RN on: 11/24/2018 4:19 PM  Actions taken: Clinical Note Signed

## 2018-11-24 NOTE — Progress Notes (Signed)
Heart Failure TeleHealth Note  Due to national recommendations of social distancing due to [place] 19, telehealth visit is felt to be most appropriate for this patient at this time.  I discussed the limitations, risks, security and privacy concerns of performing an evaluation and management service by telephone and the availability of in person appointments. I also discussed with the patient that there may be a patient responsible charge related to this service. The patient expressed understanding and agreed to proceed.   IDMarieke, [name] [birth year], MRN [id number]  Location: Home  Provider location: [address] [place] Type of Visit: Established patient   PCP:  [name], MD  Cardiologist:  No primary care provider on file. Primary HF: Dr Haroldine Laws  Chief Complaint: HF follow up   History of Present Illness: [name] is a 68 y.o. female with a history of  DMII, HTN, former tobacco use, CAD, h/o PEA arrest, chronic systolic HF  Admitted 7/4/0814GYJE increased shortness of breath and chest pain. Troponin 7.5>6.5>6.3 and BNP 1535. CXR was concerning for pulmonary edema so IV lasix was given. Taken to the cath lab on 2/10 but this was aborted due to PEA arrest. CPR started and epi was given with ROSC in 6-8 minutes. Due to failure to protect airway she was intubated. ECHO performed and showed reduced LVEF 20-25% and normal RV. Returned to the cath lab 2/13/2020and had DES to80% mLAD. RHC showed pulmonary venous HTN.Self-extubated 2/13 and weaned off milrinone 08/12/2018.  RHC RA 10  PA 60/28 (43) PCWP 29 CO/Ci 4.0/2.4  Readmitted 3/21-24/20 with recurrent respiratory distress and ADHF in setting of dietary indiscretion and not using PRN lasix. Diuresed. D/c weight 121 pounds.   Patient presents via audio conferencing for a telehealth visit today. She called the clinic yesterday c/o increased fluid retention and SOB. She had not been taking lasix  regularly. (was on 40 mg PRN only)  We started lasix 80mg  daily for 2 days. She received a home visit with Vanita Ingles RN today. Labs drawn this am creatinine up from 2.0 -> 4.1. BUN 70. K 4.4 Says she feels ok. Hasn't peed much. Says she is having some diarrhea but drinking a lot of fluid. Weight stable at 114. No fevers or chills. Remains SOB with minimal activity which is unchanged. No CP.   She had a [facility] home RN visit a few weeks ago and rhythm strip showed NSR 69 bpm.    Pt denies symptoms of cough, fevers, chills, or new SOB worrisome for COVID 19.    Past Medical History:  Diagnosis Date  . COPD (chronic obstructive pulmonary disease) ([place])   . Diabetes mellitus without complication ([place])   . Hypertension   . NSTEMI (non-ST elevated myocardial infarction) [facility])    Past Surgical History:  Procedure Laterality Date  . CORONARY STENT INTERVENTION N/A 08/10/2018   Procedure: CORONARY STENT INTERVENTION;  Surgeon: Leonie Man, MD;  Location: [place] [facility];  Service: Cardiovascular;  Laterality: N/A;  . RIGHT/LEFT HEART CATH AND CORONARY ANGIOGRAPHY N/A 08/10/2018   Procedure: RIGHT/LEFT HEART CATH AND CORONARY ANGIOGRAPHY;  Surgeon: Leonie Man, MD;  Location: [place] [facility];  Service: Cardiovascular;  Laterality: N/A;     Current Outpatient Medications  Medication Sig Dispense Refill  . aspirin 81 MG chewable tablet Chew 1 tablet (81 mg total) by mouth daily. 30 tablet 0  . atorvastatin (LIPITOR) 40 MG tablet Take 1 tablet (40 mg total)  by mouth daily at 6 PM. 30 tablet 0  . carvedilol (COREG) 3.125 MG tablet Take 1 tablet (3.125 mg total) by mouth 2 (two) times daily. 180 tablet 3  . furosemide (LASIX) 40 MG tablet Take 1 tablet (40 mg total) by mouth daily as needed for fluid or edema (take one tab if 3 pound weight gain in 24 hours or 5 pound weight gain in 1 week). (Patient not taking: Reported on 11/10/2018) 30 tablet 11  . Insulin Glargine, 2 Unit Dial, (TOUJEO  MAX SOLOSTAR) 300 UNIT/ML SOPN Inject 50 Units into the skin at bedtime.    Marland Kitchen losartan (COZAAR) 25 MG tablet Take 1 tablet (25 mg total) by mouth at bedtime. 30 tablet 0  . Magnesium Oxide 400 (240 Mg) MG TABS Take 1 tablet (400 mg total) by mouth daily. 90 tablet 2  . metFORMIN (GLUCOPHAGE) 1000 MG tablet Take 1,000 mg by mouth 2 (two) times daily with a meal.    . Multiple Vitamin (MULTIVITAMIN WITH MINERALS) TABS tablet Take 1 tablet by mouth daily. 30 tablet 0  . pantoprazole (PROTONIX) 40 MG tablet Take 1 tablet (40 mg total) by mouth daily. 30 tablet 0  . spironolactone (ALDACTONE) 25 MG tablet Take 1 tablet (25 mg total) by mouth daily. 30 tablet 0  . ticagrelor (BRILINTA) 90 MG TABS tablet Take 1 tablet (90 mg total) by mouth 2 (two) times daily. 60 tablet 0   No current facility-administered medications for this encounter.     Allergies:   Patient has no known allergies.   Social History:  The patient  reports that she has been smoking. She has been smoking about 1.00 pack per day. She has never used smokeless tobacco. She reports current alcohol use. She reports that she does not use drugs.   Family History:  The patient's family history is not on file.   ROS:  Please see the history of present illness.   All other systems are personally reviewed and negative.    Exam:  (Video/Tele Health Call; Exam is subjective and or/visual.) General:  Speaks in full sentences. No resp difficulty. Lungs: Normal respiratory effort with conversation.  Abdomen: No distension per patient report Extremities: +endorses edema in feet.  Neuro: Alert & oriented x 3.   Recent Labs: 09/16/2018: ALT 21; B Natriuretic Peptide 1,254.7 09/19/2018: Hemoglobin 12.5; Magnesium 1.2; Platelets 292 11/24/2018: BUN 70; Creatinine, Ser 4.13; Potassium 4.4; Sodium 138  Personally reviewed   Wt Readings from Last 3 Encounters:  11/10/18 52.6 kg (116 lb)  10/19/18 51.4 kg (113 lb 4.8 oz)  09/19/18 53 kg (116 lb  14.4 oz)      ASSESSMENT AND PLAN:  1.Chronicsystolicheart failure - Suspect mixed ICM. NICM. S/p PCI 80% LAD on 12/15. -ECHO 08/06/18 EF 20-25% -Stable NYHAIII-IIIB symptoms currently. - Hard to assess volume status. Weight stable. But now endorses edema that is not improving. Renal function getting worse so may be dry.  - Will have her hold further lasix. With AKI will have her come to ER for further evaluation -Off digoxin due to bradycardia - Stop spiro and losartan 25 -Continuelow dose coreg 3.125 mg BID. -Reinforced fluid restriction to < 2 L daily, sodium restriction to less than 2000 mg daily, and the importance of daily weights. - Repeat echo next visit.   2. AKI on CKD 4 - baseline creatinine 1.9-2.0 - creatinine 4.1 today. Unclear etiology. ? Volume depletion -> refer to ER for eval   3.CAD s/pNSTEMI  -  08/10/18 LHC with DES to 80% mLAD - Continueticagrelor + ASA.Denies bleeding - No s/s ischemia  4. Pulmonary Hypertension  - Mixed pHTN. PVR 4.7. Not candidate for selective pulmonary artery vasodilators at this point.  5. COPD -She reports tobacco cessation. No change.   6. DMII -Per PCP - Hgb A1C 10.2during recent admission  7. Bradycardia - Rhythm strip by [facility] RN showed NSR 69 bpm. Now off digoxin. Tolerating coreg  8. Hypomangesium - Mag 1.2 on labs 3/24. on magox 400 mg daily   COVID screen The patient does not have any symptoms that suggest any further testing/ screening at this time.  Social distancing reinforced today.  Patient Risk: After full review of this patients clinical status, I feel that they are at moderate risk for cardiac decompensation at this time.  Orders/Follow up: Report to ER  Today, I have spent 20 minutes with the patient with telehealth technology discussing CHF and sliding scale diuretics.    Signed, Glori Bickers, MD  11/24/2018 3:18 PM   [facility] [address]  Heart and [place] [address] [phone] (office) [phone] (fax)

## 2018-11-24 NOTE — Progress Notes (Signed)
Asked to recommend volume management by Dr. Darl Householder regarding Mrs. [name] - I reviewed Dr. Clayborne Dana note from today as well as the labwork and renal ultrasound today which shows medical renal disease but no obstruction. Creatinine increased significantly - suspect patient may be volume depleted. Recommend low dose IVF at 50 cc/hr x 24 hrs - repeat labs in am. CHF service will follow-up in the am.  Pixie Casino, MD, [facility], [place] Director of the [facility]  [facility] Diplomate of the American Board of Clinical Lipidology Attending Cardiologist  Direct Dial: [phone]  Fax: [phone]  Website:  [email or web address]

## 2018-11-24 NOTE — ED Notes (Signed)
Attempted to call for report x1

## 2018-11-24 NOTE — ED Notes (Signed)
Virus swab sent to the lab

## 2018-11-24 NOTE — ED Triage Notes (Signed)
Pt reports she had a televisit with MD today who advised her to come to ED. Pt here for eval of fluid retention (has not been taking lasix as directed), pt also reports decrease in urination with some shortness of breath. Pt had abnormal labs drawn which showed worsening kidney function. Pt appears pale and weak at triage.

## 2018-11-24 NOTE — ED Notes (Signed)
Pt denies pain c/o being cold  Another blanket given and heat turned up

## 2018-11-24 NOTE — ED Notes (Signed)
P[t in a procedure out of the dept

## 2018-11-24 NOTE — H&P (Signed)
History and Physical    [name] YIF:[id number] DOB: 09-04-1950 DOA: 11/24/2018  PCP: [name], MD  Patient coming from: Home  Chief Complaint: Shortness of breath abnormal labs  HPI: [name] is a 68 y.o. female with medical history significant of chronic systolic congestive heart failure with a EF of 20%, chronic kidney disease with baseline creatinine of 2, COPD, chronic respiratory failure on 1 to 2 L of nasal cannula only at night.  Patient is been having increased shortness of breath for the last several days recently was started on Lasix 80 mg daily by cardiology service.  She denies any fevers or cough.  She denies any chest pain.  Patient reports she did not diurese a whole lot with 80 mg in the last 2 days.  She had labs repeated and noted to be have worsening renal failure so was therefore sent to the emergency department for IV fluids by cardiology service.  Patient be referred for admission for acute kidney injury in the setting of systolic congestive heart failure exacerbation recently and chronic kidney disease.  Review of Systems: As per HPI otherwise 10 point review of systems negative.   Past Medical History:  Diagnosis Date  . CHF (congestive heart failure) ([place])   . COPD (chronic obstructive pulmonary disease) ([place])   . Diabetes mellitus without complication ([place])   . Hypertension   . NSTEMI (non-ST elevated myocardial infarction) [facility])     Past Surgical History:  Procedure Laterality Date  . CORONARY STENT INTERVENTION N/A 08/10/2018   Procedure: CORONARY STENT INTERVENTION;  Surgeon: Leonie Man, MD;  Location: [place] [facility];  Service: Cardiovascular;  Laterality: N/A;  . RIGHT/LEFT HEART CATH AND CORONARY ANGIOGRAPHY N/A 08/10/2018   Procedure: RIGHT/LEFT HEART CATH AND CORONARY ANGIOGRAPHY;  Surgeon: Leonie Man, MD;  Location: [place] [facility];  Service: Cardiovascular;  Laterality: N/A;     reports that she has been smoking. She has  been smoking about 1.00 pack per day. She has never used smokeless tobacco. She reports current alcohol use. She reports that she does not use drugs.  No Known Allergies  Family History  Problem Relation Age of Onset  . Breast cancer Neg Hx     Prior to Admission medications   Medication Sig Start Date End Date Taking? Authorizing Provider  aspirin 81 MG chewable tablet Chew 1 tablet (81 mg total) by mouth daily. 08/18/18   Swayze, Ava, DO  atorvastatin (LIPITOR) 40 MG tablet Take 1 tablet (40 mg total) by mouth daily at 6 PM. 08/17/18   Swayze, Ava, DO  carvedilol (COREG) 3.125 MG tablet Take 1 tablet (3.125 mg total) by mouth 2 (two) times daily. 08/29/18 11/27/18  Shirley Friar, PA-C  furosemide (LASIX) 40 MG tablet Take 1 tablet (40 mg total) by mouth daily as needed for fluid or edema (take one tab if 3 pound weight gain in 24 hours or 5 pound weight gain in 1 week). Patient not taking: Reported on 11/10/2018 09/19/18 09/19/19  Radene Gunning, NP  Insulin Glargine, 2 Unit Dial, (TOUJEO MAX SOLOSTAR) 300 UNIT/ML SOPN Inject 50 Units into the skin at bedtime.    [provider]  losartan (COZAAR) 25 MG tablet Take 1 tablet (25 mg total) by mouth at bedtime. 08/17/18   Swayze, Ava, DO  Magnesium Oxide 400 (240 Mg) MG TABS Take 1 tablet (400 mg total) by mouth daily. 11/10/18   Bensimhon, Shaune Pascal, MD  metFORMIN (GLUCOPHAGE) 1000 MG  tablet Take 1,000 mg by mouth 2 (two) times daily with a meal.    [provider]  Multiple Vitamin (MULTIVITAMIN WITH MINERALS) TABS tablet Take 1 tablet by mouth daily. 08/18/18   Swayze, Ava, DO  pantoprazole (PROTONIX) 40 MG tablet Take 1 tablet (40 mg total) by mouth daily. 08/18/18   Swayze, Ava, DO  spironolactone (ALDACTONE) 25 MG tablet Take 1 tablet (25 mg total) by mouth daily. 08/18/18   Swayze, Ava, DO  ticagrelor (BRILINTA) 90 MG TABS tablet Take 1 tablet (90 mg total) by mouth 2 (two) times daily. 08/17/18   Swayze, Ava, DO     Physical Exam: Vitals:   11/24/18 1745 11/24/18 1750 11/24/18 1915 11/24/18 1940  BP: 101/78     Pulse:   79 (!) 59  Resp:   (!) 25 19  Temp:      SpO2:  96% 97% 100%      Constitutional: NAD, calm, comfortable Vitals:   11/24/18 1745 11/24/18 1750 11/24/18 1915 11/24/18 1940  BP: 101/78     Pulse:   79 (!) 59  Resp:   (!) 25 19  Temp:      SpO2:  96% 97% 100%   Eyes: PERRL, lids and conjunctivae normal ENMT: Mucous membranes are moist. Posterior pharynx clear of any exudate or lesions.Normal dentition.  Neck: normal, supple, no masses, no thyromegaly Respiratory: clear to auscultation bilaterally, no wheezing, no crackles. Normal respiratory effort. No accessory muscle use.  Cardiovascular: Regular rate and rhythm, no murmurs / rubs / gallops. No extremity edema. 2+ pedal pulses. No carotid bruits.  Abdomen: no tenderness, no masses palpated. No hepatosplenomegaly. Bowel sounds positive.  Musculoskeletal: no clubbing / cyanosis. No joint deformity upper and lower extremities. Good ROM, no contractures. Normal muscle tone.  Skin: no rashes, lesions, ulcers. No induration Neurologic: CN 2-12 grossly intact. Sensation intact, DTR normal. Strength 5/5 in all 4.  Psychiatric: Normal judgment and insight. Alert and oriented x 3. Normal mood.    Labs on Admission: I have personally reviewed following labs and imaging studies  CBC: Recent Labs  Lab 11/24/18 1715  WBC 7.1  HGB 10.1*  HCT 32.4*  MCV 86.6  PLT 810   Basic Metabolic Panel: Recent Labs  Lab 11/24/18 1402 11/24/18 1715  NA 138 140  K 4.4 4.9  CL 113* 114*  CO2 13* 10*  GLUCOSE 132* 116*  BUN 70* 72*  CREATININE 4.13* 4.38*  CALCIUM 9.1 9.5   GFR: CrCl cannot be calculated (Unknown ideal weight.). Liver Function Tests: No results for input(s): AST, ALT, ALKPHOS, BILITOT, PROT, ALBUMIN in the last 168 hours. No results for input(s): LIPASE, AMYLASE in the last 168 hours. No results for input(s):  AMMONIA in the last 168 hours. Coagulation Profile: No results for input(s): INR, PROTIME in the last 168 hours. Cardiac Enzymes: Recent Labs  Lab 11/24/18 1715  TROPONINI 0.03*   BNP (last 3 results) No results for input(s): PROBNP in the last 8760 hours. HbA1C: No results for input(s): HGBA1C in the last 72 hours. CBG: No results for input(s): GLUCAP in the last 168 hours. Lipid Profile: No results for input(s): CHOL, HDL, LDLCALC, TRIG, CHOLHDL, LDLDIRECT in the last 72 hours. Thyroid Function Tests: No results for input(s): TSH, T4TOTAL, FREET4, T3FREE, THYROIDAB in the last 72 hours. Anemia Panel: No results for input(s): VITAMINB12, FOLATE, FERRITIN, TIBC, IRON, RETICCTPCT in the last 72 hours. Urine analysis:    Component Value Date/Time   COLORURINE STRAW (A)  08/10/2018 2055   APPEARANCEUR CLEAR 08/10/2018 2055   LABSPEC 1.009 08/10/2018 2055   PHURINE 5.0 08/10/2018 2055   GLUCOSEU NEGATIVE 08/10/2018 2055   HGBUR NEGATIVE 08/10/2018 2055   BILIRUBINUR NEGATIVE 08/10/2018 2055   [place] NEGATIVE 08/10/2018 2055   PROTEINUR NEGATIVE 08/10/2018 2055   NITRITE NEGATIVE 08/10/2018 2055   LEUKOCYTESUR NEGATIVE 08/10/2018 2055   Sepsis Labs: !!!!!!!!!!!!!!!!!!!!!!!!!!!!!!!!!!!!!!!!!!!! @LABRCNTIP (procalcitonin:4,lacticidven:4) )No results found for this or any previous visit (from the past 240 hour(s)).   Radiological Exams on Admission: Dg Chest 2 View  Result Date: 11/24/2018 CLINICAL DATA:  Pt reports she had a televisit with MD today who advised her to come to ED. Pt here for eval of fluid retention (has not been taking lasix as directed), pt also reports decrease in urination with some shortness of breath. Pt had .*comment was truncated*sob EXAM: CHEST - 2 VIEW COMPARISON:  Radiograph 11/21/2018 FINDINGS: Normal cardiac silhouette. Some improvement in the bilateral effusions. Persistent effusion on the RIGHT. Mild central venous congestion. No focal infiltrate. No  overt pulmonary edema. IMPRESSION: 1. Central venous congestion and RIGHT pleural effusion. 2. Effusions and venous congestion appear slightly improved from prior but overall similar. Electronically Signed   By: Suzy Bouchard M.D.   On: 11/24/2018 18:34   US Renal  Result Date: 11/24/2018 CLINICAL DATA:  Renal failure EXAM: RENAL / URINARY TRACT ULTRASOUND COMPLETE COMPARISON:  None. FINDINGS: Right Kidney: Renal measurements: 10.3 x 4.4 x 5.3 cm = volume: 126 mL. Mildly echogenic. No hydronephrosis or mass. Left Kidney: Renal measurements: 10.3 x 4.9 x 4.6 cm = volume: 121 mL. Mildly echogenic. No solid renal mass. Lower pole cyst measures 1.4 x 1.4 x 1.4 cm. Bladder: Appears normal for degree of bladder distention. IMPRESSION: 1. Mildly hyperechoic kidneys, compatible with chronic medical renal disease. 2. No hydronephrosis. Electronically Signed   By: Ulyses Jarred M.D.   On: 11/24/2018 18:56    EKG: Independently reviewed.  Normal sinus rhythm no acute changes Old chart reviewed Case discussed with Dr. Darl Householder in the ED Chest x-ray reviewed edema with right pleural effusion improved from previous  Assessment/Plan 68 year old female recently treated for acute systolic congestive heart failure exacerbation on chronic disease with EF of 20% over the last 2 days with worsening renal function Principal Problem:   AKI (acute kidney injury) (HCC)-hold diuretics.  Placed on normal saline at 50 cc an hour overnight gentle fluid hydration and then repeat creatinine in the morning.  Monitor I's and O's closely and daily weights.  Also holding all nephrotoxic substances.  Active Problems:   Diabetes mellitus (HCC)-holding oral medications at this time.  Glucose less than 200 at this time.  Diabetic diet.    CKD (chronic kidney disease), stage III (HCC)-Baseline creatinine around 2    Chronic systolic CHF (congestive heart failure) (HCC)-last EF 20%.  Cardiology consulted.  Again holding diuretics in the  setting of acute kidney injury.     DVT prophylaxis: SCDs Code Status: Full Family Communication: None Disposition Plan: Likely several days Consults called: Cardiology Admission status: Admission   Marymargaret Kirker A MD [facility]  If 7PM-7AM, please contact night-coverage [email or web address] Password [facility]  11/24/2018, 8:23 PM

## 2018-11-24 NOTE — Progress Notes (Addendum)
[name]        DOB: 12-Jun-1951  Purpose of Visit: assess pt and BMET at noon today. HF provider: Bensimhon  Medications: Is the patient taking all medications listed on MAR from Epic? She states she is  List any medications that are not being taken correctly:   List any medication refills needed:n/a  Is the patient able to pick up medications? Yes  Vitals: BP: 102/68    HR: 75   Oxygen: 96%RA Weight: 114.6lb        Physical Exam:  Peripheral edema: yes.  BLE's 2+ but [name] states they have improved some.  Wounds: no  Location:  Any patient concerns?  At rest, [name] denied feeling shob. However, she said she was ready to go back to her room to lie down for a nap.  Her partner said that with any exertion, even moving around on the sofa, that she would get shob.  [name] said she was taking higher doses of diuretics as instructed, but hadn't been urinating much more than usual.  She said she wasn't any worse than she was in the previous few days but also wasn't feeling much better.  BLE's are 2+ but she says they are a bit better than they had been.  Her partner said her CBG's have been running lower than they had been, this am CBG 144.  Also that [name] has been using Megestrol x last week and it has improved her appetite. Her partner was saying she'd like an appointment w/the HF clinic and will call on Monday to make appt.  She also mentioned that a friend let [name] use her oxygen tank one night and it helped her to sleep better and will discuss w/PCP and/or HF provider.  ReDS Vest/Clip Reading: n/a  Rhythm Strip: n/a  Is Home Health recommended? Yes If yes, state reason: [name] partner would like assistance w/ADL's for [name] as it is getting difficult for her to assist with d/t her own health concerns.   Vanita Ingles, RN 11/24/18

## 2018-11-24 NOTE — Progress Notes (Signed)
Dr Haroldine Laws advise pt to report to ER due to elevated creatinine level.

## 2018-11-24 NOTE — ED Provider Notes (Signed)
[place] EMERGENCY DEPARTMENT Provider Note   CSN: [id number] Arrival date & time: 11/24/18  1644    History   Chief Complaint Chief Complaint  Patient presents with  . Kidney Failure  . Congestive Heart Failure    HPI [name] is a 68 y.o. female hx of systolic CHF with EF of 44%, COPD, DM, HTN, here presenting with renal failure.  Patient was admitted several months ago for heart failure.  Patient has not been the most compliant with her Lasix.  She complain of some worsening shortness of breath for the last several days.  She then went to see cardiology yesterday and her Lasix was increased to 80 mg daily.  She had labs drawn today and her creatinine went up to 4.  She states that she has been drinking some fluids at home.  She states that her legs are slightly swollen but her weight is stable.  She was seen by Dr. Haroldine Laws from cardiology today who recommend admission and further work-up for renal failure.  Patient states that she has not been urinating much the last several days.  She also has some diarrhea as well.     The history is provided by the patient.    Past Medical History:  Diagnosis Date  . CHF (congestive heart failure) ([place])   . COPD (chronic obstructive pulmonary disease) ([place])   . Diabetes mellitus without complication ([place])   . Hypertension   . NSTEMI (non-ST elevated myocardial infarction) [facility])     Patient Active Problem List   Diagnosis Date Noted  . AKI (acute kidney injury) ([place]) 11/24/2018  . Chronic systolic CHF (congestive heart failure) ([place]) 11/24/2018  . Malnutrition of moderate degree 09/19/2018  . Acute on chronic systolic (congestive) heart failure ([place]) 09/16/2018  . Acute on chronic respiratory failure with hypoxia ([place]) 09/16/2018  . CKD (chronic kidney disease), stage III ([place]) 09/16/2018  . CAD (coronary artery disease), native coronary artery 09/16/2018  . NSTEMI (non-ST elevated myocardial infarction) ([address])  08/06/2018  . Hypokalemia 08/06/2018  . Diabetes mellitus ([place]) 08/06/2018  . NSTEMI, initial episode of care [facility]) 08/06/2018    Past Surgical History:  Procedure Laterality Date  . CORONARY STENT INTERVENTION N/A 08/10/2018   Procedure: CORONARY STENT INTERVENTION;  Surgeon: Leonie Man, MD;  Location: [place] CV [facility];  Service: Cardiovascular;  Laterality: N/A;  . RIGHT/LEFT HEART CATH AND CORONARY ANGIOGRAPHY N/A 08/10/2018   Procedure: RIGHT/LEFT HEART CATH AND CORONARY ANGIOGRAPHY;  Surgeon: Leonie Man, MD;  Location: [place] [facility];  Service: Cardiovascular;  Laterality: N/A;     OB History   No obstetric history on file.      Home Medications    Prior to Admission medications   Medication Sig Start Date End Date Taking? Authorizing Provider  aspirin 81 MG chewable tablet Chew 1 tablet (81 mg total) by mouth daily. 08/18/18   Swayze, Ava, DO  atorvastatin (LIPITOR) 40 MG tablet Take 1 tablet (40 mg total) by mouth daily at 6 PM. 08/17/18   Swayze, Ava, DO  carvedilol (COREG) 3.125 MG tablet Take 1 tablet (3.125 mg total) by mouth 2 (two) times daily. 08/29/18 11/27/18  Shirley Friar, PA-C  furosemide (LASIX) 40 MG tablet Take 1 tablet (40 mg total) by mouth daily as needed for fluid or edema (take one tab if 3 pound weight gain in 24 hours or 5 pound weight gain in 1 week). Patient not taking: Reported on 11/10/2018 09/19/18  09/19/19  Black, Lezlie Octave, NP  Insulin Glargine, 2 Unit Dial, (TOUJEO MAX SOLOSTAR) 300 UNIT/ML SOPN Inject 50 Units into the skin at bedtime.    [provider]  losartan (COZAAR) 25 MG tablet Take 1 tablet (25 mg total) by mouth at bedtime. 08/17/18   Swayze, Ava, DO  Magnesium Oxide 400 (240 Mg) MG TABS Take 1 tablet (400 mg total) by mouth daily. 11/10/18   Bensimhon, Shaune Pascal, MD  metFORMIN (GLUCOPHAGE) 1000 MG tablet Take 1,000 mg by mouth 2 (two) times daily with a meal.    [provider]  Multiple Vitamin  (MULTIVITAMIN WITH MINERALS) TABS tablet Take 1 tablet by mouth daily. 08/18/18   Swayze, Ava, DO  pantoprazole (PROTONIX) 40 MG tablet Take 1 tablet (40 mg total) by mouth daily. 08/18/18   Swayze, Ava, DO  spironolactone (ALDACTONE) 25 MG tablet Take 1 tablet (25 mg total) by mouth daily. 08/18/18   Swayze, Ava, DO  ticagrelor (BRILINTA) 90 MG TABS tablet Take 1 tablet (90 mg total) by mouth 2 (two) times daily. 08/17/18   Swayze, Ava, DO    Family History Family History  Problem Relation Age of Onset  . Breast cancer Neg Hx     Social History Social History   Tobacco Use  . Smoking status: Current Every Day Smoker    Packs/day: 1.00  . Smokeless tobacco: Never Used  Substance Use Topics  . Alcohol use: Yes    Frequency: Never    Comment: 2-3 drinks per week   . Drug use: Never     Allergies   Patient has no known allergies.   Review of Systems Review of Systems  Respiratory: Positive for shortness of breath.   All other systems reviewed and are negative.    Physical Exam Updated Vital Signs BP 101/78   Pulse (!) 59   Temp 97.7 F (36.5 C)   Resp 19   SpO2 100%   Physical Exam Vitals signs and nursing note reviewed.  HENT:     Head: Normocephalic.     Left Ear: Tympanic membrane normal.     Nose: Nose normal.     Mouth/Throat:     Mouth: Mucous membranes are dry.  Eyes:     Extraocular Movements: Extraocular movements intact.     Pupils: Pupils are equal, round, and reactive to light.  Neck:     Musculoskeletal: Normal range of motion.  Cardiovascular:     Rate and Rhythm: Normal rate and regular rhythm.     Pulses: Normal pulses.     Heart sounds: Normal heart sounds.  Pulmonary:     Effort: Pulmonary effort is normal.     Breath sounds: Normal breath sounds.  Abdominal:     General: Abdomen is flat.     Palpations: Abdomen is soft.  Musculoskeletal: Normal range of motion.     Comments: Trace edema bilaterally   Skin:    General: Skin is warm.      Capillary Refill: Capillary refill takes less than 2 seconds.  Neurological:     General: No focal deficit present.     Mental Status: She is alert.  Psychiatric:        Mood and Affect: Mood normal.        Behavior: Behavior normal.      ED Treatments / Results  Labs (all labs ordered are listed, but only abnormal results are displayed) Labs Reviewed  BASIC METABOLIC PANEL - Abnormal; Notable for the  following components:      Result Value   Chloride 114 (*)    CO2 10 (*)    Glucose, Bld 116 (*)    BUN 72 (*)    Creatinine, Ser 4.38 (*)    GFR calc non Af Amer 10 (*)    GFR calc Af Amer 11 (*)    Anion gap 16 (*)    All other components within normal limits  CBC - Abnormal; Notable for the following components:   RBC 3.74 (*)    Hemoglobin 10.1 (*)    HCT 32.4 (*)    RDW 18.6 (*)    nRBC 1.4 (*)    All other components within normal limits  BRAIN NATRIURETIC PEPTIDE - Abnormal; Notable for the following components:   B Natriuretic Peptide 4,046.9 (*)    All other components within normal limits  TROPONIN I - Abnormal; Notable for the following components:   Troponin I 0.03 (*)    All other components within normal limits  SARS CORONAVIRUS 2 (HOSPITAL ORDER, [place] [facility])  URINALYSIS, ROUTINE W REFLEX MICROSCOPIC    EKG None  Radiology Dg Chest 2 View  Result Date: 11/24/2018 CLINICAL DATA:  Pt reports she had a televisit with MD today who advised her to come to ED. Pt here for eval of fluid retention (has not been taking lasix as directed), pt also reports decrease in urination with some shortness of breath. Pt had .*comment was truncated*sob EXAM: CHEST - 2 VIEW COMPARISON:  Radiograph 11/21/2018 FINDINGS: Normal cardiac silhouette. Some improvement in the bilateral effusions. Persistent effusion on the RIGHT. Mild central venous congestion. No focal infiltrate. No overt pulmonary edema. IMPRESSION: 1. Central venous congestion and RIGHT  pleural effusion. 2. Effusions and venous congestion appear slightly improved from prior but overall similar. Electronically Signed   By: Suzy Bouchard M.D.   On: 11/24/2018 18:34   US Renal  Result Date: 11/24/2018 CLINICAL DATA:  Renal failure EXAM: RENAL / URINARY TRACT ULTRASOUND COMPLETE COMPARISON:  None. FINDINGS: Right Kidney: Renal measurements: 10.3 x 4.4 x 5.3 cm = volume: 126 mL. Mildly echogenic. No hydronephrosis or mass. Left Kidney: Renal measurements: 10.3 x 4.9 x 4.6 cm = volume: 121 mL. Mildly echogenic. No solid renal mass. Lower pole cyst measures 1.4 x 1.4 x 1.4 cm. Bladder: Appears normal for degree of bladder distention. IMPRESSION: 1. Mildly hyperechoic kidneys, compatible with chronic medical renal disease. 2. No hydronephrosis. Electronically Signed   By: Ulyses Jarred M.D.   On: 11/24/2018 18:56    Procedures Procedures (including critical care time)  Medications Ordered in ED Medications  sodium chloride flush (NS) 0.9 % injection 3 mL (3 mLs Intravenous Not Given 11/24/18 1845)  0.9 %  sodium chloride infusion (has no administration in time range)     Initial Impression / Assessment and Plan / ED Course  I have reviewed the triage vital signs and the nursing notes.  Pertinent labs & imaging results that were available during my care of the patient were reviewed by me and considered in my medical decision making (see chart for details).       Kili Cedotal is a 68 y.o. female here with SOB, renal failure. Appears dry but has hx of CHF with EF 25%. Will check CXR, BNP. Will also get renal US to r/o hydro. Will likely need admission.   8:08 PM BNP 4000, Cr up to 4.4. Renal US unremarkable. I talked to cardiology, who  recommend NS at 50cc/kg and admission to hospitalist team. Heart failure team will follow patient as well.    Final Clinical Impressions(s) / ED Diagnoses   Final diagnoses:  None    ED Discharge Orders    None       Drenda Freeze, MD 11/24/18 2009

## 2018-11-24 NOTE — ED Notes (Signed)
Admitting doctor at the bedside 

## 2018-11-24 NOTE — ED Notes (Signed)
ED TO INPATIENT HANDOFF REPORT  ED Nurse Name and Phone #: Caprice Kluver 32  S Name/Age/Gender [name] 68 y.o. female Room/Bed: 039C/039C  Code Status   Code Status: Full Code  Home/SNF/Other Home Patient oriented to: self, place, time and situation Is this baseline? Yes   Triage Complete: Triage complete  Chief Complaint sent by dr   Triage Note Pt reports she had a televisit with MD today who advised her to come to ED. Pt here for eval of fluid retention (has not been taking lasix as directed), pt also reports decrease in urination with some shortness of breath. Pt had abnormal labs drawn which showed worsening kidney function. Pt appears pale and weak at triage.    Allergies No Known Allergies  Level of Care/Admitting Diagnosis ED Disposition    ED Disposition Condition [place] [facility] Area: [place] [[id number]]  Level of Care: Progressive [102]  Covid Evaluation: Screening Protocol (No Symptoms)  Diagnosis: AKI (acute kidney injury) [facility]) [[id number]]  Admitting Physician: Phillips Grout [[id number]]  Attending Physician: Derrill Kay A [[id number]]  Estimated length of stay: 3 - 4 days  Certification:: I certify this patient will need inpatient services for at least 2 midnights  PT Class (Do Not Modify): Inpatient [101]  PT Acc Code (Do Not Modify): Private [1]       B Medical/Surgery History Past Medical History:  Diagnosis Date  . CHF (congestive heart failure) ([place])   . COPD (chronic obstructive pulmonary disease) ([place])   . Diabetes mellitus without complication ([place])   . Hypertension   . NSTEMI (non-ST elevated myocardial infarction) [facility])    Past Surgical History:  Procedure Laterality Date  . CORONARY STENT INTERVENTION N/A 08/10/2018   Procedure: CORONARY STENT INTERVENTION;  Surgeon: Leonie Man, MD;  Location: [place] [facility];  Service: Cardiovascular;  Laterality: N/A;  . RIGHT/LEFT HEART CATH AND CORONARY ANGIOGRAPHY  N/A 08/10/2018   Procedure: RIGHT/LEFT HEART CATH AND CORONARY ANGIOGRAPHY;  Surgeon: Leonie Man, MD;  Location: [place] [facility];  Service: Cardiovascular;  Laterality: N/A;     A IV Location/Drains/Wounds Patient Lines/Drains/Airways Status   Active Line/Drains/Airways    Name:   Placement date:   Placement time:   Site:   Days:   Peripheral IV 11/24/18 Left;Upper Arm   11/24/18    2105    Arm   less than 1   External Urinary Catheter   11/24/18    1845    -   less than 1          Intake/Output Last 24 hours No intake or output data in the 24 hours ending 11/24/18 2310  Labs/Imaging Results for orders placed or performed during the hospital encounter of 11/24/18 (from the past 48 hour(s))  Basic metabolic panel     Status: Abnormal   Collection Time: 11/24/18  5:15 PM  Result Value Ref Range   Sodium 140 135 - 145 mmol/L   Potassium 4.9 3.5 - 5.1 mmol/L   Chloride 114 (H) 98 - 111 mmol/L   CO2 10 (L) 22 - 32 mmol/L   Glucose, Bld 116 (H) 70 - 99 mg/dL   BUN 72 (H) 8 - 23 mg/dL   Creatinine, Ser 4.38 (H) 0.44 - 1.00 mg/dL   Calcium 9.5 8.9 - 10.3 mg/dL   GFR calc non Af Amer 10 (L) >60 mL/min   GFR calc Af Amer 11 (L) >60 mL/min   Anion gap  16 (H) 5 - 15    Comment: Performed at [facility], [place] [address], [place], [place] [address]  CBC     Status: Abnormal   Collection Time: 11/24/18  5:15 PM  Result Value Ref Range   WBC 7.1 4.0 - 10.5 K/uL   RBC 3.74 (L) 3.87 - 5.11 MIL/uL   Hemoglobin 10.1 (L) 12.0 - 15.0 g/dL   HCT 32.4 (L) 36.0 - 46.0 %   MCV 86.6 80.0 - 100.0 fL   MCH 27.0 26.0 - 34.0 pg   MCHC 31.2 30.0 - 36.0 g/dL   RDW 18.6 (H) 11.5 - 15.5 %   Platelets 249 150 - 400 K/uL   nRBC 1.4 (H) 0.0 - 0.2 %    Comment: Performed at [facility] [address], [place], [place] [address]  Brain natriuretic peptide     Status: Abnormal   Collection Time: 11/24/18  5:15 PM  Result Value Ref Range   B Natriuretic Peptide 4,046.9 (H) 0.0 -  100.0 pg/mL    Comment: Performed at [facility] [address] [place], [place] [address]  Troponin I - ONCE - STAT     Status: Abnormal   Collection Time: 11/24/18  5:15 PM  Result Value Ref Range   Troponin I 0.03 (HH) <0.03 ng/mL    Comment: CRITICAL RESULT CALLED TO, READ BACK BY AND VERIFIED WITH: S.TOWNS RN 1819 11/24/2018 MCCORMICK K Performed at [facility], [place] [address] [place], [place] [address]   SARS Coronavirus 2 (CEPHEID - Performed in [place] hospital lab), Hosp Order     Status: None   Collection Time: 11/24/18  8:27 PM  Result Value Ref Range   SARS Coronavirus 2 NEGATIVE NEGATIVE    Comment: (NOTE) If result is NEGATIVE SARS-CoV-2 target nucleic acids are NOT DETECTED. The SARS-CoV-2 RNA is generally detectable in upper and lower  respiratory specimens during the acute phase of infection. The lowest  concentration of SARS-CoV-2 viral copies this assay can detect is 250  copies / mL. A negative result does not preclude SARS-CoV-2 infection  and should not be used as the sole basis for treatment or other  patient management decisions.  A negative result may occur with  improper specimen collection / handling, submission of specimen other  than nasopharyngeal swab, presence of viral mutation(s) within the  areas targeted by this assay, and inadequate number of viral copies  (<250 copies / mL). A negative result must be combined with clinical  observations, patient history, and epidemiological information. If result is POSITIVE SARS-CoV-2 target nucleic acids are DETECTED. The SARS-CoV-2 RNA is generally detectable in upper and lower  respiratory specimens dur ing the acute phase of infection.  Positive  results are indicative of active infection with SARS-CoV-2.  Clinical  correlation with patient history and other diagnostic information is  necessary to determine patient infection status.  Positive results do  not rule out bacterial  infection or co-infection with other viruses. If result is PRESUMPTIVE POSTIVE SARS-CoV-2 nucleic acids MAY BE PRESENT.   A presumptive positive result was obtained on the submitted specimen  and confirmed on repeat testing.  While 2019 novel coronavirus  (SARS-CoV-2) nucleic acids may be present in the submitted sample  additional confirmatory testing may be necessary for epidemiological  and / or clinical management purposes  to differentiate between  SARS-CoV-2 and other Sarbecovirus currently known to infect humans.  If clinically indicated additional testing  with an alternate test  methodology [phone]) is advised. The SARS-CoV-2 RNA is generally  detectable in upper and lower respiratory sp ecimens during the acute  phase of infection. The expected result is Negative. Fact Sheet for Patients:  StrictlyIdeas.no Fact Sheet for Healthcare Providers: BankingDealers.co.za This test is not yet approved or cleared by the [place] FDA and has been authorized for detection and/or diagnosis of SARS-CoV-2 by FDA under an Emergency Use Authorization (EUA).  This EUA will remain in effect (meaning this test can be used) for the duration of the COVID-19 declaration under Section 564(b)(1) of the Act, 21 U.S.C. section 360bbb-3(b)(1), unless the authorization is terminated or revoked sooner. Performed at [facility], [place] [address], [place], [place] [address]    Dg Chest 2 View  Result Date: 11/24/2018 CLINICAL DATA:  Pt reports she had a televisit with MD today who advised her to come to ED. Pt here for eval of fluid retention (has not been taking lasix as directed), pt also reports decrease in urination with some shortness of breath. Pt had .*comment was truncated*sob EXAM: CHEST - 2 VIEW COMPARISON:  Radiograph 11/21/2018 FINDINGS: Normal cardiac silhouette. Some improvement in the bilateral effusions. Persistent effusion on the  RIGHT. Mild central venous congestion. No focal infiltrate. No overt pulmonary edema. IMPRESSION: 1. Central venous congestion and RIGHT pleural effusion. 2. Effusions and venous congestion appear slightly improved from prior but overall similar. Electronically Signed   By: Suzy Bouchard M.D.   On: 11/24/2018 18:34   US Renal  Result Date: 11/24/2018 CLINICAL DATA:  Renal failure EXAM: RENAL / URINARY TRACT ULTRASOUND COMPLETE COMPARISON:  None. FINDINGS: Right Kidney: Renal measurements: 10.3 x 4.4 x 5.3 cm = volume: 126 mL. Mildly echogenic. No hydronephrosis or mass. Left Kidney: Renal measurements: 10.3 x 4.9 x 4.6 cm = volume: 121 mL. Mildly echogenic. No solid renal mass. Lower pole cyst measures 1.4 x 1.4 x 1.4 cm. Bladder: Appears normal for degree of bladder distention. IMPRESSION: 1. Mildly hyperechoic kidneys, compatible with chronic medical renal disease. 2. No hydronephrosis. Electronically Signed   By: Ulyses Jarred M.D.   On: 11/24/2018 18:56    Pending Labs Unresulted Labs (From admission, onward)    Start     Ordered   11/25/18 5859  Basic metabolic panel  Tomorrow morning,   R     11/24/18 2020   11/25/18 0500  CBC  Tomorrow morning,   R     11/24/18 2020   11/24/18 2228  Lactic acid, plasma  Once,   R     11/24/18 2227          Vitals/Pain Today's Vitals   11/24/18 2100 11/24/18 2135 11/24/18 2200 11/24/18 2230  BP: 102/68 102/73 101/77 106/82  Pulse: (!) 57 78 72 78  Resp: 13 (!) 21 (!) 27 (!) 25  Temp:      SpO2: (!) 89% 100% 100% 100%  PainSc:        Isolation Precautions No active isolations  Medications Medications  DOBUTamine (DOBUTREX) infusion 4000 mcg/mL (has no administration in time range)    Mobility Walks with assistive device High fall risk   Focused Assessments NA   R Recommendations: See Admitting Provider Note  Report given to:   Additional Notes:

## 2018-11-24 NOTE — ED Notes (Signed)
Family updated on admission status.  To call hospital later for bed assignment.

## 2018-11-25 ENCOUNTER — Inpatient Hospital Stay (HOSPITAL_COMMUNITY): Payer: Medicare HMO

## 2018-11-25 DIAGNOSIS — B370 Candidal stomatitis: Secondary | ICD-10-CM

## 2018-11-25 DIAGNOSIS — N183 Chronic kidney disease, stage 3 (moderate): Secondary | ICD-10-CM

## 2018-11-25 DIAGNOSIS — I361 Nonrheumatic tricuspid (valve) insufficiency: Secondary | ICD-10-CM

## 2018-11-25 DIAGNOSIS — I272 Pulmonary hypertension, unspecified: Secondary | ICD-10-CM

## 2018-11-25 DIAGNOSIS — J449 Chronic obstructive pulmonary disease, unspecified: Secondary | ICD-10-CM

## 2018-11-25 DIAGNOSIS — I2510 Atherosclerotic heart disease of native coronary artery without angina pectoris: Secondary | ICD-10-CM

## 2018-11-25 DIAGNOSIS — N184 Chronic kidney disease, stage 4 (severe): Secondary | ICD-10-CM

## 2018-11-25 DIAGNOSIS — E1122 Type 2 diabetes mellitus with diabetic chronic kidney disease: Secondary | ICD-10-CM

## 2018-11-25 DIAGNOSIS — I340 Nonrheumatic mitral (valve) insufficiency: Secondary | ICD-10-CM

## 2018-11-25 LAB — MAGNESIUM: Magnesium: 1.4 mg/dL — ABNORMAL LOW (ref 1.7–2.4)

## 2018-11-25 LAB — BASIC METABOLIC PANEL
Anion gap: 13 (ref 5–15)
BUN: 76 mg/dL — ABNORMAL HIGH (ref 8–23)
CO2: 14 mmol/L — ABNORMAL LOW (ref 22–32)
Calcium: 9.2 mg/dL (ref 8.9–10.3)
Chloride: 113 mmol/L — ABNORMAL HIGH (ref 98–111)
Creatinine, Ser: 4.57 mg/dL — ABNORMAL HIGH (ref 0.44–1.00)
GFR calc Af Amer: 11 mL/min — ABNORMAL LOW (ref >60)
GFR calc non Af Amer: 9 mL/min — ABNORMAL LOW (ref >60)
Glucose, Bld: 85 mg/dL (ref 70–99)
Potassium: 4.7 mmol/L (ref 3.5–5.1)
Sodium: 140 mmol/L (ref 135–145)

## 2018-11-25 LAB — CBC
HCT: 28.3 % — ABNORMAL LOW (ref 36.0–46.0)
Hemoglobin: 9.2 g/dL — ABNORMAL LOW (ref 12.0–15.0)
MCH: 26.7 pg (ref 26.0–34.0)
MCHC: 32.5 g/dL (ref 30.0–36.0)
MCV: 82.3 fL (ref 80.0–100.0)
Platelets: 207 10*3/uL (ref 150–400)
RBC: 3.44 MIL/uL — ABNORMAL LOW (ref 3.87–5.11)
RDW: 18.2 % — ABNORMAL HIGH (ref 11.5–15.5)
WBC: 7.2 10*3/uL (ref 4.0–10.5)
nRBC: 1.3 % — ABNORMAL HIGH (ref 0.0–0.2)

## 2018-11-25 LAB — GLUCOSE, CAPILLARY
Glucose-Capillary: 88 mg/dL (ref 70–99)
Glucose-Capillary: 123 mg/dL — ABNORMAL HIGH (ref 70–99)
Glucose-Capillary: 111 mg/dL — ABNORMAL HIGH (ref 70–99)

## 2018-11-25 LAB — ECHOCARDIOGRAM COMPLETE
Height: 62 in
Weight: 1788.8 oz

## 2018-11-25 MED ORDER — INSULIN ASPART 100 UNIT/ML ~~LOC~~ SOLN: 0.0000 [IU] | Freq: Every day | SUBCUTANEOUS | Status: DC

## 2018-11-25 MED ORDER — TICAGRELOR 90 MG PO TABS
90.0000 mg | ORAL_TABLET | Freq: Two times a day (BID) | ORAL | Status: DC
  Administered 2018-11-25 – 2018-12-05 (×21): 90 mg via ORAL
  Filled 2018-11-25 (×21): qty 1

## 2018-11-25 MED ORDER — ASPIRIN 81 MG PO CHEW
81.0000 mg | CHEWABLE_TABLET | Freq: Every day | ORAL | Status: DC
  Administered 2018-11-25 – 2018-12-05 (×10): 81 mg via ORAL
  Filled 2018-11-25 (×11): qty 1

## 2018-11-25 MED ORDER — PANTOPRAZOLE SODIUM 40 MG PO TBEC
40.0000 mg | DELAYED_RELEASE_TABLET | Freq: Every day | ORAL | Status: DC
  Administered 2018-11-25 – 2018-12-05 (×11): 40 mg via ORAL
  Filled 2018-11-25 (×11): qty 1

## 2018-11-25 MED ORDER — ATORVASTATIN CALCIUM 40 MG PO TABS
40.0000 mg | ORAL_TABLET | Freq: Every day | ORAL | Status: DC
  Administered 2018-11-25 – 2018-12-04 (×10): 40 mg via ORAL
  Filled 2018-11-25 (×11): qty 1

## 2018-11-25 MED ORDER — INSULIN GLARGINE 100 UNIT/ML ~~LOC~~ SOLN
30.0000 [IU] | Freq: Every day | SUBCUTANEOUS | Status: DC
  Filled 2018-11-25: qty 0.3

## 2018-11-25 MED ORDER — INSULIN ASPART 100 UNIT/ML ~~LOC~~ SOLN
0.0000 [IU] | Freq: Three times a day (TID) | SUBCUTANEOUS | Status: DC
  Administered 2018-11-28 – 2018-11-30 (×3): 2 [IU] via SUBCUTANEOUS
  Administered 2018-12-01: 3 [IU] via SUBCUTANEOUS
  Administered 2018-12-02 (×2): 1 [IU] via SUBCUTANEOUS
  Administered 2018-12-02: 3 [IU] via SUBCUTANEOUS
  Administered 2018-12-03: 1 [IU] via SUBCUTANEOUS
  Administered 2018-12-03 – 2018-12-05 (×3): 2 [IU] via SUBCUTANEOUS

## 2018-11-25 MED ORDER — MAGNESIUM SULFATE 2 GM/50ML IV SOLN
2.0000 g | Freq: Once | INTRAVENOUS | Status: AC
  Administered 2018-11-25: 12:00:00 2 g via INTRAVENOUS
  Filled 2018-11-25: qty 50

## 2018-11-25 MED ORDER — NYSTATIN 100000 UNIT/ML MT SUSP
5.0000 mL | Freq: Four times a day (QID) | OROMUCOSAL | Status: DC
  Administered 2018-11-25 – 2018-12-05 (×40): 500000 [IU] via OROMUCOSAL
  Filled 2018-11-25 (×36): qty 5

## 2018-11-25 NOTE — Progress Notes (Signed)
Spoke with Colletta Maryland, patient's spouse and updated her on patient status.   Emelda Fear, RN

## 2018-11-25 NOTE — Plan of Care (Signed)
POC initiated and progressing. 

## 2018-11-25 NOTE — Progress Notes (Signed)
*  PRELIMINARY RESULTS* Echocardiogram 2D Echocardiogram has been performed.  Leavy Cella 11/25/2018, 1:21 PM

## 2018-11-25 NOTE — Consult Note (Signed)
Advanced Heart Failure Team Consult Note   Primary Physician: [name], MD PCP-Cardiologist:  No primary care provider on file.   Requesting: Dr. Shanon Brow .  Reason for Consultation: Heart failure  HPI:    [name] is seen today for evaluation of HF at the request of Dr. Alferd Patee Melling is a 68 y.o. female with a history of DMII, HTN, former tobacco use, CAD, h/o PEA arrest, chronic systolic HF  Admitted 7/5/[id number] increased shortness of breath and chest pain. Troponin 7.5>6.5>6.3 and BNP 1535. CXR was concerning for pulmonary edema so IV lasix was given. Taken to the cath lab on 2/10 but this was aborted due to PEA arrest. CPR started and epi was given with ROSC in 6-8 minutes. Due to failure to protect airway she was intubated. ECHO performed and showed reduced LVEF 20-25% (new) and normal RV. Returned to the cath lab 2/13/2020and had DES to80% mLAD. RHC showed pulmonaryvenous HTN.Self-extubated 2/13 and weaned off milrinone 08/12/2018.  RHC RA 10  PA 60/28 (43) PCWP 29 CO/Ci 4.0/2.4  Readmitted 3/21-24/20 with recurrent respiratory distress and ADHF in setting of dietary indiscretion and not using PRN lasix. Diuresed. D/c weight 121 pounds.   Had been doing fairly well at home until earlier this week when she called the office c/o increased fluid retention and SOB. She had not been taking lasix regularly. (was on 40 mg PRN only)  We started lasix 80mg  daily for 2 days. She received a home visit with Vanita Ingles RN. Labs drawn this am creatinine up from 2.0 -> 4.1 with bicarb 13  BUN 70. K 4.4 sent to ER for further evaluation.   In ER volume status looked ok but BNP very high (4,050 - up from 1,250) . Creatinine up to 4.38 with bicarb 10. Initially given IVF. In looking at labs I was concerned for low output HF. Lactate drawn was mildly elevated at 2.0. Dobutamine started at 2.5.  This am feels much better. Denies CP or SOB. No orthopnea or PND.  Creatinine up to 4.57 Bicarb 14. K 4.7. No uremia     Review of Systems: [y] = yes, [ ]  = no   . General: Weight gain [ ] ; Weight loss Blue.Reese ]; Anorexia [ ] ; Fatigue [y]; Fever [ ] ; Chills [ ] ; Weakness Blue.Reese ]  . Cardiac: Chest pain/pressure [ ] ; Resting SOB [ ] ; Exertional SOB Blue.Reese ]; Orthopnea [ ] ; Pedal Edema Blue.Reese ]; Palpitations [ ] ; Syncope [ ] ; Presyncope [ ] ; Paroxysmal nocturnal dyspnea[ ]   . Pulmonary: Cough [ ] ; Wheezing[ ] ; Hemoptysis[ ] ; Sputum [ ] ; Snoring [ ]   . GI: Vomiting[ ] ; Dysphagia[ ] ; Melena[ ] ; Hematochezia [ ] ; Heartburn[ ] ; Abdominal pain [ ] ; Constipation [ ] ; Diarrhea [ ] ; BRBPR [ ]   . GU: Hematuria[ ] ; Dysuria [ ] ; Nocturia[ ]   . Vascular: Pain in legs with walking [ ] ; Pain in feet with lying flat [ ] ; Non-healing sores [ ] ; Stroke [ ] ; TIA [ ] ; Slurred speech [ ] ;  . Neuro: Headaches[ ] ; Vertigo[ ] ; Seizures[ ] ; Paresthesias[ ] ;Blurred vision [ ] ; Diplopia [ ] ; Vision changes [ ]   . Ortho/Skin: Arthritis Blue.Reese ]; Joint pain Blue.Reese ]; Muscle pain [ ] ; Joint swelling [ ] ; Back Pain [ ] ; Rash [ ]   . Psych: Depression[ ] ; Anxiety[ ]   . Heme: Bleeding problems [ ] ; Clotting disorders [ ] ; Anemia [ ]   . Endocrine: Diabetes Blue.Reese ]; Thyroid dysfunction[ ]   Home  Medications Prior to Admission medications   Medication Sig Start Date End Date Taking? Authorizing Provider  aspirin 81 MG chewable tablet Chew 1 tablet (81 mg total) by mouth daily. 08/18/18  Yes Swayze, Ava, DO  atorvastatin (LIPITOR) 40 MG tablet Take 1 tablet (40 mg total) by mouth daily at 6 PM. 08/17/18  Yes Swayze, Ava, DO  carvedilol (COREG) 3.125 MG tablet Take 1 tablet (3.125 mg total) by mouth 2 (two) times daily. 08/29/18 11/27/18 Yes Tillery, Satira Mccallum, PA-C  furosemide (LASIX) 40 MG tablet Take 1 tablet (40 mg total) by mouth daily as needed for fluid or edema (take one tab if 3 pound weight gain in 24 hours or 5 pound weight gain in 1 week). 09/19/18 09/19/19 Yes Black, Lezlie Octave, NP  losartan (COZAAR) 25 MG tablet  Take 1 tablet (25 mg total) by mouth at bedtime. 08/17/18  Yes Swayze, Ava, DO  Magnesium Oxide 400 (240 Mg) MG TABS Take 1 tablet (400 mg total) by mouth daily. 11/10/18  Yes Garrett Bowring, Shaune Pascal, MD  megestrol (MEGACE) 40 MG/ML suspension Take 800 mg by mouth daily.   Yes [provider]  metFORMIN (GLUCOPHAGE) 1000 MG tablet Take 1,000 mg by mouth 2 (two) times daily with a meal.   Yes [provider]  Multiple Vitamin (MULTIVITAMIN WITH MINERALS) TABS tablet Take 1 tablet by mouth daily. 08/18/18  Yes Swayze, Ava, DO  pantoprazole (PROTONIX) 40 MG tablet Take 1 tablet (40 mg total) by mouth daily. 08/18/18  Yes Swayze, Ava, DO  spironolactone (ALDACTONE) 25 MG tablet Take 1 tablet (25 mg total) by mouth daily. 08/18/18  Yes Swayze, Ava, DO  ticagrelor (BRILINTA) 90 MG TABS tablet Take 1 tablet (90 mg total) by mouth 2 (two) times daily. 08/17/18  Yes 32, Ava, DO    Past Medical History: Past Medical History:  Diagnosis Date  . CHF (congestive heart failure) ([place])   . COPD (chronic obstructive pulmonary disease) ([place])   . Diabetes mellitus without complication ([place])   . Hypertension   . NSTEMI (non-ST elevated myocardial infarction) [facility])     Past Surgical History: Past Surgical History:  Procedure Laterality Date  . CORONARY STENT INTERVENTION N/A 08/10/2018   Procedure: CORONARY STENT INTERVENTION;  Surgeon: Leonie Man, MD;  Location: [place] [facility];  Service: Cardiovascular;  Laterality: N/A;  . RIGHT/LEFT HEART CATH AND CORONARY ANGIOGRAPHY N/A 08/10/2018   Procedure: RIGHT/LEFT HEART CATH AND CORONARY ANGIOGRAPHY;  Surgeon: Leonie Man, MD;  Location: [place] [facility];  Service: Cardiovascular;  Laterality: N/A;    Family History: Family History  Problem Relation Age of Onset  . Breast cancer Neg Hx     Social History: Social History   Socioeconomic History  . Marital status: Married    Spouse name: Not on file  . Number of children: Not  on file  . Years of education: Not on file  . Highest education level: Not on file  Occupational History  . Not on file  Social Needs  . Financial resource strain: Not on file  . Food insecurity:    Worry: Not on file    Inability: Not on file  . Transportation needs:    Medical: Not on file    Non-medical: Not on file  Tobacco Use  . Smoking status: Current Every Day Smoker    Packs/day: 1.00  . Smokeless tobacco: Never Used  Substance and Sexual Activity  . Alcohol use: Yes    Frequency:  Never    Comment: 2-3 drinks per week   . Drug use: Never  . Sexual activity: Not on file  Lifestyle  . Physical activity:    Days per week: Not on file    Minutes per session: Not on file  . Stress: Not on file  Relationships  . Social connections:    Talks on phone: Not on file    Gets together: Not on file    Attends religious service: Not on file    Active member of club or organization: Not on file    Attends meetings of clubs or organizations: Not on file    Relationship status: Not on file  Other Topics Concern  . Not on file  Social History Narrative  . Not on file    Allergies:  No Known Allergies  Objective:    Vital Signs:   Temp:  [96.9 F (36.1 C)-98.1 F (36.7 C)] 98.1 F (36.7 C) (05/30 0917) Pulse Rate:  [57-84] 82 (05/30 0917) Resp:  [13-27] 17 (05/30 0423) BP: (96-112)/(68-82) 112/73 (05/30 0917) SpO2:  [89 %-100 %] 100 % (05/30 0917) Weight:  [50.7 kg-52 kg] 50.7 kg (05/30 0423) Last BM Date: 11/24/18  Weight change: Filed Weights   11/25/18 0005 11/25/18 0423  Weight: 50.7 kg 50.7 kg    Intake/Output:   Intake/Output Summary (Last 24 hours) at 11/25/2018 1012 Last data filed at 11/25/2018 0927 Gross per 24 hour  Intake 240 ml  Output 600 ml  Net -360 ml      Physical Exam    General:  Elderly. Frail appearing. Lying flat. No resp difficulty HEENT: normal Neck: supple. JVP hard to see but does not appear overly elevated . Carotids 2+  bilat; no bruits. No lymphadenopathy or thyromegaly appreciated. Cor: PMI nondisplaced. Regular rate & rhythm. No rubs, gallops or murmurs. Lungs: clear with decreased BS throguhout Abdomen: soft, nontender, nondistended. No hepatosplenomegaly. No bruits or masses. Good bowel sounds. Extremities: no cyanosis, clubbing, rash, edema Warm Neuro: alert & orientedx3, cranial nerves grossly intact. moves all 4 extremities w/o difficulty. Affect pleasant   Telemetry   NSR 80 Personally reviewed  EKG    NSR 77 nonspecific TWA. Personally reviewed   Labs   Basic Metabolic Panel: Recent Labs  Lab 11/24/18 1402 11/24/18 1715 11/25/18 0250  NA 138 140 140  K 4.4 4.9 4.7  CL 113* 114* 113*  CO2 13* 10* 14*  GLUCOSE 132* 116* 85  BUN 70* 72* 76*  CREATININE 4.13* 4.38* 4.57*  CALCIUM 9.1 9.5 9.2  MG  --   --  1.4*    Liver Function Tests: No results for input(s): AST, ALT, ALKPHOS, BILITOT, PROT, ALBUMIN in the last 168 hours. No results for input(s): LIPASE, AMYLASE in the last 168 hours. No results for input(s): AMMONIA in the last 168 hours.  CBC: Recent Labs  Lab 11/24/18 1715 11/25/18 0250  WBC 7.1 7.2  HGB 10.1* 9.2*  HCT 32.4* 28.3*  MCV 86.6 82.3  PLT 249 207    Cardiac Enzymes: Recent Labs  Lab 11/24/18 1715  TROPONINI 0.03*    BNP: BNP (last 3 results) Recent Labs    08/06/18 0628 09/16/18 0342 11/24/18 1715  BNP 1,535.2* 1,254.7* 4,046.9*    ProBNP (last 3 results) No results for input(s): PROBNP in the last 8760 hours.   CBG: No results for input(s): GLUCAP in the last 168 hours.  Coagulation Studies: No results for input(s): LABPROT, INR in the last 72 hours.  Imaging   Dg Chest 2 View  Result Date: 11/24/2018 CLINICAL DATA:  Pt reports she had a televisit with MD today who advised her to come to ED. Pt here for eval of fluid retention (has not been taking lasix as directed), pt also reports decrease in urination with some shortness  of breath. Pt had .*comment was truncated*sob EXAM: CHEST - 2 VIEW COMPARISON:  Radiograph 11/21/2018 FINDINGS: Normal cardiac silhouette. Some improvement in the bilateral effusions. Persistent effusion on the RIGHT. Mild central venous congestion. No focal infiltrate. No overt pulmonary edema. IMPRESSION: 1. Central venous congestion and RIGHT pleural effusion. 2. Effusions and venous congestion appear slightly improved from prior but overall similar. Electronically Signed   By: Suzy Bouchard M.D.   On: 11/24/2018 18:34   US Renal  Result Date: 11/24/2018 CLINICAL DATA:  Renal failure EXAM: RENAL / URINARY TRACT ULTRASOUND COMPLETE COMPARISON:  None. FINDINGS: Right Kidney: Renal measurements: 10.3 x 4.4 x 5.3 cm = volume: 126 mL. Mildly echogenic. No hydronephrosis or mass. Left Kidney: Renal measurements: 10.3 x 4.9 x 4.6 cm = volume: 121 mL. Mildly echogenic. No solid renal mass. Lower pole cyst measures 1.4 x 1.4 x 1.4 cm. Bladder: Appears normal for degree of bladder distention. IMPRESSION: 1. Mildly hyperechoic kidneys, compatible with chronic medical renal disease. 2. No hydronephrosis. Electronically Signed   By: Ulyses Jarred M.D.   On: 11/24/2018 18:56      Medications:     Current Medications: . aspirin  81 mg Oral Daily  . atorvastatin  40 mg Oral q1800  . insulin aspart  0-5 Units Subcutaneous QHS  . insulin aspart  0-9 Units Subcutaneous TID WC  . nystatin  5 mL Mouth/Throat QID  . pantoprazole  40 mg Oral Daily  . ticagrelor  90 mg Oral BID     Infusions: . DOBUTamine 2.5 mcg/kg/min (11/24/18 2323)      Assessment/Plan   1. AKI on CKD 4 - baseline creatinine 1.9-2.0 - creatinine 4.6 today. Suspect low output HF (? Cold/dry) - Continue dobutamine at 2.5 - Hold lasix and other HF meds  - Volume status ok. Acidosis improving. No uremia so no acute indications for HD - Will follow on dobutamine - If worsens tomorrow will need to involve Renal though I do not  think she can tolerate long-term HD  2. Acute/Chronicsystolicheart failure - Suspect mixed ICM. NICM. S/p PCI 80% LAD on 12/15. -ECHO 08/06/18 EF 20-25% -StableNYHAIV symptoms currently. - Hard to assess volume status. Suspect cold/dry - Continue dobutamine at 2.5 - Hold lasix and other HF meds  - May need RHC soon - Repeat echo   3.CAD s/pNSTEMI  - 08/10/18 LHC with DES to 80% mLAD - Continueticagrelor + ASA.Denies bleeding - No s/s ischemia  4. Pulmonary Hypertension  - Mixed pHTN. PVR 4.7. Not candidate for selective pulmonary artery vasodilators at this point.  5. COPD -She reports tobacco cessation. No change.   6. DMII -Per PCP - Hgb A1C 10.2during recent admission    Length of Stay: 1  Glori Bickers, MD  11/25/2018, 10:12 AM  Advanced Heart Failure Team Pager [phone] (M-F; 7a - 4p)  Please contact [facility] for night-coverage after hours (4p -7a ) and weekends on [email or web address]

## 2018-11-25 NOTE — Progress Notes (Signed)
Patient had a 5 beat run of wide QRS Vtach. Patient asyptomatic and lying in bed. BP 97/71, HR 73. MD paged via [place].  Emelda Fear, RN

## 2018-11-25 NOTE — Progress Notes (Addendum)
PROGRESS NOTE    [name]   EGB:[id number]  DOB: 10/24/1950  DOA: 11/24/2018 PCP: [name], MD   Brief Narrative:  [name] is a 68 y.o. female with medical history significant of DM2, ichronic systolic CHF with a EF of 20%, CAD with stent to prox LAD in 2/20, CKD 3 with baseline creatinine of 2, COPD/ chronic respiratory failure on 1 to 2 L of nasal cannula only at night.  She presents with dyspnea that has not improved with oral Lasix increased from 40 to 80 mg daily 2 days prior to admission by cardiology. She was told to come into the ED for AKI in setting of fluid overload.  CXR> central venous congestion, effusions only slightly improved from prior Renal US> Mildly hyperechoic kidneys, compatible with chronic medical renal disease  SBP in low 100s Pulse ox 100% on 3 L Cr 4.38 with baseline being ~ 2 LA> 2.0 BNP 4,046 Trop > 0.03  Subjective: Tells me she has no dyspnea and overall feels better today. She has not other complaints.     Assessment & Plan:   Principal Problem:   AKI (acute kidney injury) on CKD 3 with metabolic acidosis Acute on Chronic systolic CHF (congestive heart failure)-  Pulmonary HTN   - AKI due to low cardiac output in setting of Lasix, Aldactone and Losartan use- all 3 being held - renal ultrasound consistent with medical renal disease - heart failure team assisting with management and Dobutamine has been started to increase cardiac output - follow Cr in AM  Active Problems: Hypotension/ Lactic acidosis - Coreg being held  Elevated troponin - mild elevation at 0.03 related to above AKI and heart failure-     Diabetes mellitus 2   - last A1c 9.3 on 3/22- poor control with nephropathy - she states she no longer takes long acting insulin- will follow sugars int he hospital and see if we need to initiate this - will add SSI  - hold Glucophage  Thrush - possibly related to uncontrolled DM - start Nystatin  GERD - has heart burn  frequently- treat thrush which may be extending into esophagus with Nystatin swish and swallow - cont Protonix daily    CAD (coronary artery disease), native coronary artery - cont ASA and Brillinta - cont Atorvastatin    COPD (chronic obstructive pulmonary disease)  - no exacerbation noted     Time spent in minutes: 35 DVT prophylaxis: SCDs Code Status: Full code Family Communication:  Disposition Plan: f/u Cr- PT eval Consultants:   Heart failure team Procedures:   none Antimicrobials:  Anti-infectives (From admission, onward)   None       Objective: Vitals:   11/24/18 2230 11/24/18 2324 11/25/18 0005 11/25/18 0423  BP: 106/82 108/82 106/68 110/82  Pulse: 78 81 79   Resp: (!) 25 19 (!) 21 17  Temp:   (!) 96.9 F (36.1 C) 97.9 F (36.6 C)  TempSrc:   Axillary Oral  SpO2: 100% 100% 93% 100%  Weight:   50.7 kg 50.7 kg  Height:   5\' 2"  (1.575 m)    No intake or output data in the 24 hours ending 11/25/18 0842 Filed Weights   11/25/18 0005 11/25/18 0423  Weight: 50.7 kg 50.7 kg    Examination: General exam: Appears comfortable  HEENT: PERRLA, oral mucosa moist, no sclera icterus or thrush Respiratory system: Coarse crackles at b/l bases. Respiratory effort normal. Cardiovascular system: S1 & S2 heard, RRR.   Gastrointestinal  system: Abdomen soft, non-tender, nondistended. Normal bowel sounds. Central nervous system: Alert and oriented. No focal neurological deficits. Extremities: No cyanosis, clubbing or edema Skin: No rashes or ulcers Psychiatry:  Mood & affect appropriate.     Data Reviewed: I have personally reviewed following labs and imaging studies  CBC: Recent Labs  Lab 11/24/18 1715 11/25/18 0250  WBC 7.1 7.2  HGB 10.1* 9.2*  HCT 32.4* 28.3*  MCV 86.6 82.3  PLT 249 354   Basic Metabolic Panel: Recent Labs  Lab 11/24/18 1402 11/24/18 1715 11/25/18 0250  NA 138 140 140  K 4.4 4.9 4.7  CL 113* 114* 113*  CO2 13* 10* 14*  GLUCOSE  132* 116* 85  BUN 70* 72* 76*  CREATININE 4.13* 4.38* 4.57*  CALCIUM 9.1 9.5 9.2  MG  --   --  1.4*   GFR: Estimated Creatinine Clearance: 9.1 mL/min (A) (by C-G formula based on SCr of 4.57 mg/dL (H)). Liver Function Tests: No results for input(s): AST, ALT, ALKPHOS, BILITOT, PROT, ALBUMIN in the last 168 hours. No results for input(s): LIPASE, AMYLASE in the last 168 hours. No results for input(s): AMMONIA in the last 168 hours. Coagulation Profile: No results for input(s): INR, PROTIME in the last 168 hours. Cardiac Enzymes: Recent Labs  Lab 11/24/18 1715  TROPONINI 0.03*   BNP (last 3 results) No results for input(s): PROBNP in the last 8760 hours. HbA1C: No results for input(s): HGBA1C in the last 72 hours. CBG: No results for input(s): GLUCAP in the last 168 hours. Lipid Profile: No results for input(s): CHOL, HDL, LDLCALC, TRIG, CHOLHDL, LDLDIRECT in the last 72 hours. Thyroid Function Tests: No results for input(s): TSH, T4TOTAL, FREET4, T3FREE, THYROIDAB in the last 72 hours. Anemia Panel: No results for input(s): VITAMINB12, FOLATE, FERRITIN, TIBC, IRON, RETICCTPCT in the last 72 hours. Urine analysis:    Component Value Date/Time   COLORURINE STRAW (A) 08/10/2018 2055   APPEARANCEUR CLEAR 08/10/2018 2055   LABSPEC 1.009 08/10/2018 2055   PHURINE 5.0 08/10/2018 2055   GLUCOSEU NEGATIVE 08/10/2018 2055   HGBUR NEGATIVE 08/10/2018 2055   BILIRUBINUR NEGATIVE 08/10/2018 2055   KETONESUR NEGATIVE 08/10/2018 2055   PROTEINUR NEGATIVE 08/10/2018 2055   NITRITE NEGATIVE 08/10/2018 2055   LEUKOCYTESUR NEGATIVE 08/10/2018 2055   Sepsis Labs: @LABRCNTIP (procalcitonin:4,lacticidven:4) ) Recent Results (from the past 240 hour(s))  SARS Coronavirus 2 (CEPHEID - Performed in [facility] hospital lab), Hosp Order     Status: None   Collection Time: 11/24/18  8:27 PM  Result Value Ref Range Status   SARS Coronavirus 2 NEGATIVE NEGATIVE Final    Comment: (NOTE) If  result is NEGATIVE SARS-CoV-2 target nucleic acids are NOT DETECTED. The SARS-CoV-2 RNA is generally detectable in upper and lower  respiratory specimens during the acute phase of infection. The lowest  concentration of SARS-CoV-2 viral copies this assay can detect is 250  copies / mL. A negative result does not preclude SARS-CoV-2 infection  and should not be used as the sole basis for treatment or other  patient management decisions.  A negative result may occur with  improper specimen collection / handling, submission of specimen other  than nasopharyngeal swab, presence of viral mutation(s) within the  areas targeted by this assay, and inadequate number of viral copies  (<250 copies / mL). A negative result must be combined with clinical  observations, patient history, and epidemiological information. If result is POSITIVE SARS-CoV-2 target nucleic acids are DETECTED. The SARS-CoV-2 RNA is generally  detectable in upper and lower  respiratory specimens dur ing the acute phase of infection.  Positive  results are indicative of active infection with SARS-CoV-2.  Clinical  correlation with patient history and other diagnostic information is  necessary to determine patient infection status.  Positive results do  not rule out bacterial infection or co-infection with other viruses. If result is PRESUMPTIVE POSTIVE SARS-CoV-2 nucleic acids MAY BE PRESENT.   A presumptive positive result was obtained on the submitted specimen  and confirmed on repeat testing.  While 2019 novel coronavirus  (SARS-CoV-2) nucleic acids may be present in the submitted sample  additional confirmatory testing may be necessary for epidemiological  and / or clinical management purposes  to differentiate between  SARS-CoV-2 and other Sarbecovirus currently known to infect humans.  If clinically indicated additional testing with an alternate test  methodology [phone]) is advised. The SARS-CoV-2 RNA is generally   detectable in upper and lower respiratory sp ecimens during the acute  phase of infection. The expected result is Negative. Fact Sheet for Patients:  StrictlyIdeas.no Fact Sheet for Healthcare Providers: BankingDealers.co.za This test is not yet approved or cleared by the [place] FDA and has been authorized for detection and/or diagnosis of SARS-CoV-2 by FDA under an Emergency Use Authorization (EUA).  This EUA will remain in effect (meaning this test can be used) for the duration of the COVID-19 declaration under Section 564(b)(1) of the Act, 21 U.S.C. section 360bbb-3(b)(1), unless the authorization is terminated or revoked sooner. Performed at [facility], [place] [address], [place], [place] [address]          Radiology Studies: Dg Chest 2 View  Result Date: 11/24/2018 CLINICAL DATA:  Pt reports she had a televisit with MD today who advised her to come to ED. Pt here for eval of fluid retention (has not been taking lasix as directed), pt also reports decrease in urination with some shortness of breath. Pt had .*comment was truncated*sob EXAM: CHEST - 2 VIEW COMPARISON:  Radiograph 11/21/2018 FINDINGS: Normal cardiac silhouette. Some improvement in the bilateral effusions. Persistent effusion on the RIGHT. Mild central venous congestion. No focal infiltrate. No overt pulmonary edema. IMPRESSION: 1. Central venous congestion and RIGHT pleural effusion. 2. Effusions and venous congestion appear slightly improved from prior but overall similar. Electronically Signed   By: Suzy Bouchard M.D.   On: 11/24/2018 18:34   US Renal  Result Date: 11/24/2018 CLINICAL DATA:  Renal failure EXAM: RENAL / URINARY TRACT ULTRASOUND COMPLETE COMPARISON:  None. FINDINGS: Right Kidney: Renal measurements: 10.3 x 4.4 x 5.3 cm = volume: 126 mL. Mildly echogenic. No hydronephrosis or mass. Left Kidney: Renal measurements: 10.3 x 4.9 x 4.6 cm =  volume: 121 mL. Mildly echogenic. No solid renal mass. Lower pole cyst measures 1.4 x 1.4 x 1.4 cm. Bladder: Appears normal for degree of bladder distention. IMPRESSION: 1. Mildly hyperechoic kidneys, compatible with chronic medical renal disease. 2. No hydronephrosis. Electronically Signed   By: Ulyses Jarred M.D.   On: 11/24/2018 18:56      Scheduled Meds: . aspirin  81 mg Oral Daily  . atorvastatin  40 mg Oral q1800  . insulin aspart  0-5 Units Subcutaneous QHS  . insulin aspart  0-9 Units Subcutaneous TID WC  . nystatin  5 mL Mouth/Throat QID  . pantoprazole  40 mg Oral Daily  . ticagrelor  90 mg Oral BID   Continuous Infusions: . DOBUTamine 2.5 mcg/kg/min (11/24/18 2323)  LOS: 1 day      Debbe Odea, MD [facility] Pager: [email or web address] Password [email or web address] 11/25/2018, 8:42 AM

## 2018-11-25 NOTE — Evaluation (Signed)
Physical Therapy Evaluation Patient Details Name: [name] MRN: [id number] DOB: 17-Feb-1951 Today's Date: 11/25/2018   History of Present Illness  Pt adm with acute hypoxic respiratory failure due to acute kidney injury. PMH - heart failure, copd, cad, ckd, dm, NSTEMI  Clinical Impression  Patient tolerated ambulation well. Her HR reached 84 bpm and o2 remained around 94% on room air. She reported some fatigue, but had no dyspnea or syncope. She would benefit from further skilled therapy to improve endurance and mobility. She may also benefit from home physical therapy for endurance training upon discharge.     Follow Up Recommendations Home health PT(also expressed intrest in Cardio pulm rehab )    Equipment Recommendations       Recommendations for Other Services       Precautions / Restrictions Precautions Precautions: None Restrictions Weight Bearing Restrictions: No      Mobility  Bed Mobility Overal bed mobility: Modified Independent             General bed mobility comments: Patient able to sit up without assistance and get back in bed without assistance   Transfers Overall transfer level: Needs assistance Equipment used: 4-wheeled walker Transfers: Sit to/from Stand Sit to Stand: Supervision         General transfer comment: No SOB or syncope with agit. Supervision for intial balance   Ambulation/Gait Ambulation/Gait assistance: Supervision Gait Distance (Feet): 60 Feet Assistive device: 4-wheeled walker Gait Pattern/deviations: Step-through pattern Gait velocity: decreased    General Gait Details: slow step through pattern. Mild fatigue noted after gait but no dyspne or syncope. HR remained about 85 and sao2 at 94% post ambualtion   Stairs            Wheelchair Mobility    Modified Rankin (Stroke Patients Only)       Balance Overall balance assessment: Needs assistance Sitting-balance support: No upper extremity supported Sitting  balance-Leahy Scale: Good     Standing balance support: Bilateral upper extremity supported Standing balance-Leahy Scale: Poor                               Pertinent Vitals/Pain Pain Assessment: No/denies pain    Home Living Family/patient expects to be discharged to:: Private residence Living Arrangements: Spouse/significant other Available Help at Discharge: Family Type of Home: Apartment Home Access: Level entry     Home Layout: One level   Additional Comments: Patient can either go up 10 steps or go around a long walkway.     Prior Function Level of Independence: Independent with assistive device(s)         Comments: was using a rollator      Hand Dominance   Dominant Hand: Right    Extremity/Trunk Assessment   Upper Extremity Assessment Upper Extremity Assessment: Overall WFL for tasks assessed    Lower Extremity Assessment Lower Extremity Assessment: Overall WFL for tasks assessed    Cervical / Trunk Assessment Cervical / Trunk Assessment: Normal  Communication   Communication: No difficulties  Cognition Arousal/Alertness: Awake/alert Behavior During Therapy: WFL for tasks assessed/performed Overall Cognitive Status: Within Functional Limits for tasks assessed                                        General Comments      Exercises     Assessment/Plan  PT Assessment Patient needs continued PT services  PT Problem List Decreased strength;Decreased range of motion;Decreased activity tolerance;Decreased balance;Decreased mobility;Decreased safety awareness       PT Treatment Interventions DME instruction;Gait training;Functional mobility training;Therapeutic activities;Therapeutic exercise;Patient/family education    PT Goals (Current goals can be found in the Care Plan section)  Acute Rehab PT Goals Patient Stated Goal: to go home  PT Goal Formulation: With patient Time For Goal Achievement:  12/02/18 Potential to Achieve Goals: Good    Frequency Min 2X/week   Barriers to discharge        Co-evaluation               AM-PAC PT "6 Clicks" Mobility  Outcome Measure Help needed turning from your back to your side while in a flat bed without using bedrails?: None Help needed moving from lying on your back to sitting on the side of a flat bed without using bedrails?: None Help needed moving to and from a bed to a chair (including a wheelchair)?: A Little Help needed standing up from a chair using your arms (e.g., wheelchair or bedside chair)?: A Little Help needed to walk in hospital room?: A Little Help needed climbing 3-5 steps with a railing? : A Little 6 Click Score: 20    End of Session Equipment Utilized During Treatment: Gait belt Activity Tolerance: Patient tolerated treatment well Patient left: in bed;with bed alarm set(declined the recliner ) Nurse Communication: Mobility status PT Visit Diagnosis: Unsteadiness on feet (R26.81);Other abnormalities of gait and mobility (R26.89);Muscle weakness (generalized) (M62.81)    Time: 1425-1450 PT Time Calculation (min) (ACUTE ONLY): 25 min   Charges:   PT Evaluation $PT Eval Moderate Complexity: 1 Mod            Carney Living PT DPT  11/25/2018, 3:50 PM

## 2018-11-26 DIAGNOSIS — I5021 Acute systolic (congestive) heart failure: Secondary | ICD-10-CM

## 2018-11-26 LAB — BASIC METABOLIC PANEL
Anion gap: 12 (ref 5–15)
BUN: 77 mg/dL — ABNORMAL HIGH (ref 8–23)
CO2: 15 mmol/L — ABNORMAL LOW (ref 22–32)
Calcium: 9.1 mg/dL (ref 8.9–10.3)
Chloride: 112 mmol/L — ABNORMAL HIGH (ref 98–111)
Creatinine, Ser: 4.86 mg/dL — ABNORMAL HIGH (ref 0.44–1.00)
GFR calc Af Amer: 10 mL/min — ABNORMAL LOW (ref >60)
GFR calc non Af Amer: 8 mL/min — ABNORMAL LOW (ref >60)
Glucose, Bld: 103 mg/dL — ABNORMAL HIGH (ref 70–99)
Potassium: 4.1 mmol/L (ref 3.5–5.1)
Sodium: 139 mmol/L (ref 135–145)

## 2018-11-26 LAB — GLUCOSE, CAPILLARY
Glucose-Capillary: 90 mg/dL (ref 70–99)
Glucose-Capillary: 127 mg/dL — ABNORMAL HIGH (ref 70–99)
Glucose-Capillary: 131 mg/dL — ABNORMAL HIGH (ref 70–99)
Glucose-Capillary: 126 mg/dL — ABNORMAL HIGH (ref 70–99)

## 2018-11-26 LAB — LACTIC ACID, PLASMA: Lactic Acid, Venous: 0.9 mmol/L (ref 0.5–1.9)

## 2018-11-26 LAB — MAGNESIUM: Magnesium: 2.1 mg/dL (ref 1.7–2.4)

## 2018-11-26 MED ORDER — SODIUM CHLORIDE 0.9 % IV SOLN: 250.0000 mL | INTRAVENOUS | Status: DC | PRN

## 2018-11-26 MED ORDER — SODIUM CHLORIDE 0.9 % IV SOLN
INTRAVENOUS | Status: DC
  Administered 2018-11-27: 06:00:00 via INTRAVENOUS

## 2018-11-26 MED ORDER — ASPIRIN 81 MG PO CHEW
81.0000 mg | CHEWABLE_TABLET | ORAL | Status: AC
  Administered 2018-11-27: 81 mg via ORAL
  Filled 2018-11-26: qty 1

## 2018-11-26 MED ORDER — SODIUM CHLORIDE 0.9% FLUSH
3.0000 mL | Freq: Two times a day (BID) | INTRAVENOUS | Status: DC
  Administered 2018-11-26: 21:00:00 3 mL via INTRAVENOUS

## 2018-11-26 MED ORDER — ENOXAPARIN SODIUM 30 MG/0.3ML ~~LOC~~ SOLN
30.0000 mg | SUBCUTANEOUS | Status: DC
  Administered 2018-11-26 – 2018-11-29 (×4): 30 mg via SUBCUTANEOUS
  Filled 2018-11-26 (×4): qty 0.3

## 2018-11-26 MED ORDER — SODIUM CHLORIDE 0.9% FLUSH: 3.0000 mL | INTRAVENOUS | Status: DC | PRN

## 2018-11-26 NOTE — Progress Notes (Signed)
Concerned about Pt's IV site. It is swollen, but  it flushes well and has blood returned. I asked the CN to double check with me, He did have the same findings: it flushes fine with blood return. IV team notified for further assessment, and both IV team nurses we talked to advised to watch the site, if it get worse to call them back. We'll continue to monitor.

## 2018-11-26 NOTE — Progress Notes (Signed)
PROGRESS NOTE    [name]   LYY:[id number]  DOB: [birth year]  DOA: 11/24/2018 PCP: [name], MD   Brief Narrative:  [name] is a 68 y.o. female with medical history significant of DM2, ichronic systolic CHF with a EF of 20%, CAD with stent to prox LAD in 2/20, CKD 3 with baseline creatinine of 2, COPD/ chronic respiratory failure on 1 to 2 L of nasal cannula only at night.  She presents with dyspnea that has not improved with oral Lasix increased from 40 to 80 mg daily 2 days prior to admission by cardiology. She was told to come into the ED for AKI in setting of fluid overload.  CXR> central venous congestion, effusions only slightly improved from prior Renal US> Mildly hyperechoic kidneys, compatible with chronic medical renal disease  SBP in low 100s Pulse ox 100% on 3 L Cr 4.38 with baseline being ~ 2 LA> 2.0 BNP 4,046 Trop > 0.03  Subjective: She has no complaints and tells me she feels well.     Assessment & Plan:   Principal Problem:   AKI (acute kidney injury) on CKD 3 with metabolic acidosis Acute on Chronic systolic CHF (congestive heart failure)-  Pulmonary HTN   - AKI due to low cardiac output in setting of Lasix, Aldactone and Losartan use- all 3 being held - renal ultrasound consistent with medical renal disease - heart failure team assisting with management and Dobutamine has been started to increase cardiac output - Cr has slightly risen today- defer nephrology consult to cardiology team  Active Problems: Hypotension/ Lactic acidosis - Coreg being held - Lactic acidosis resolved  Elevated troponin - mild elevation at 0.03 related to above AKI and heart failure-   Short 5 beat run of V tach - on 5/30- Coreg on hold currently by cardiology    Diabetes mellitus 2   - last A1c 9.3 on 3/22- poor control with nephropathy - she states she no longer takes long acting insulin- will follow sugars int he hospital and see if we need to initiate this -   SSI ordered- sugars are well controlled - hold Glucophage  Thrush - possibly related to uncontrolled DM - start Nystatin  GERD - has heart burn frequently- treat thrush which may be extending into esophagus with Nystatin swish and swallow - cont Protonix daily    CAD (coronary artery disease), native coronary artery - cont ASA and Brillinta - cont Atorvastatin    COPD (chronic obstructive pulmonary disease)  - no exacerbation noted     Time spent in minutes: 35 DVT prophylaxis: SCDs Code Status: Full code Family Communication:  Disposition Plan: f/u Cr- PT recommends HHPT  Consultants:   Heart failure team Procedures:   2 D ECHO 1. The left ventricle has severely reduced systolic function, with an ejection fraction of 25-30%. The cavity size was normal. Indeterminate diastolic filling due to E-A fusion.  2. LVEF is severely depressed at approximately 25% with hypokinesi of the mid LV; akinesis of the distal 1/3 of LV No significant change in LVEF from echo report from Feb 2020.  3. The right ventricle has normal systolic function. The cavity was normal. There is no increase in right ventricular wall thickness.  4. Left atrial size was mildly dilated.  5. Trivial pericardial effusion is present.  6. The mitral valve is abnormal. Mild thickening of the mitral valve leaflet.  7. The aortic valve is tricuspid. Mild thickening of the aortic valve. Mild calcification of  the aortic valve.  8. The interatrial septum was not assessed.  Antimicrobials:  Anti-infectives (From admission, onward)   None       Objective: Vitals:   11/25/18 1800 11/25/18 1953 11/26/18 0004 11/26/18 0516  BP:  103/61 105/74 104/74  Pulse:  80 83 78  Resp: (!) 31 19 16 19   Temp:  97.6 F (36.4 C) 98.4 F (36.9 C) 97.7 F (36.5 C)  TempSrc:  Oral Oral Oral  SpO2:  100% 100% 100%  Weight:    49.9 kg  Height:        Intake/Output Summary (Last 24 hours) at 11/26/2018 0724 Last data filed  at 11/26/2018 0053 Gross per 24 hour  Intake 320.42 ml  Output 1500 ml  Net -1179.58 ml   Filed Weights   11/25/18 0005 11/25/18 0423 11/26/18 0516  Weight: 50.7 kg 50.7 kg 49.9 kg    Examination: General exam: Appears comfortable  HEENT: PERRLA, oral mucosa moist, no sclera icterus or thrush Respiratory system: Clear to auscultation. Respiratory effort normal. Cardiovascular system: S1 & S2 heard,  No murmurs  Gastrointestinal system: Abdomen soft, non-tender, nondistended. Normal bowel sounds   Central nervous system: Alert and oriented. No focal neurological deficits. Extremities: No cyanosis, clubbing or edema Skin: No rashes or ulcers Psychiatry:  Mood & affect appropriate.     Data Reviewed: I have personally reviewed following labs and imaging studies  CBC: Recent Labs  Lab 11/24/18 1715 11/25/18 0250  WBC 7.1 7.2  HGB 10.1* 9.2*  HCT 32.4* 28.3*  MCV 86.6 82.3  PLT 249 295   Basic Metabolic Panel: Recent Labs  Lab 11/24/18 1402 11/24/18 1715 11/25/18 0250 11/26/18 0406  NA 138 140 140 139  K 4.4 4.9 4.7 4.1  CL 113* 114* 113* 112*  CO2 13* 10* 14* 15*  GLUCOSE 132* 116* 85 103*  BUN 70* 72* 76* 77*  CREATININE 4.13* 4.38* 4.57* 4.86*  CALCIUM 9.1 9.5 9.2 9.1  MG  --   --  1.4* 2.1   GFR: Estimated Creatinine Clearance: 8.5 mL/min (A) (by C-G formula based on SCr of 4.86 mg/dL (H)). Liver Function Tests: No results for input(s): AST, ALT, ALKPHOS, BILITOT, PROT, ALBUMIN in the last 168 hours. No results for input(s): LIPASE, AMYLASE in the last 168 hours. No results for input(s): AMMONIA in the last 168 hours. Coagulation Profile: No results for input(s): INR, PROTIME in the last 168 hours. Cardiac Enzymes: Recent Labs  Lab 11/24/18 1715  TROPONINI 0.03*   BNP (last 3 results) No results for input(s): PROBNP in the last 8760 hours. HbA1C: No results for input(s): HGBA1C in the last 72 hours. CBG: Recent Labs  Lab 11/25/18 1154 11/25/18  1631 11/25/18 2126  GLUCAP 88 123* 111*   Lipid Profile: No results for input(s): CHOL, HDL, LDLCALC, TRIG, CHOLHDL, LDLDIRECT in the last 72 hours. Thyroid Function Tests: No results for input(s): TSH, T4TOTAL, FREET4, T3FREE, THYROIDAB in the last 72 hours. Anemia Panel: No results for input(s): VITAMINB12, FOLATE, FERRITIN, TIBC, IRON, RETICCTPCT in the last 72 hours. Urine analysis:    Component Value Date/Time   COLORURINE STRAW (A) 08/10/2018 2055   APPEARANCEUR CLEAR 08/10/2018 2055   LABSPEC 1.009 08/10/2018 2055   PHURINE 5.0 08/10/2018 2055   GLUCOSEU NEGATIVE 08/10/2018 2055   HGBUR NEGATIVE 08/10/2018 2055   [place] NEGATIVE 08/10/2018 2055   [place] NEGATIVE 08/10/2018 2055   PROTEINUR NEGATIVE 08/10/2018 2055   NITRITE NEGATIVE 08/10/2018 2055   LEUKOCYTESUR NEGATIVE 08/10/2018  2055   Sepsis Labs: @LABRCNTIP (procalcitonin:4,lacticidven:4) ) Recent Results (from the past 240 hour(s))  SARS Coronavirus 2 (CEPHEID - Performed in [place] hospital lab), Hosp Order     Status: None   Collection Time: 11/24/18  8:27 PM  Result Value Ref Range Status   SARS Coronavirus 2 NEGATIVE NEGATIVE Final    Comment: (NOTE) If result is NEGATIVE SARS-CoV-2 target nucleic acids are NOT DETECTED. The SARS-CoV-2 RNA is generally detectable in upper and lower  respiratory specimens during the acute phase of infection. The lowest  concentration of SARS-CoV-2 viral copies this assay can detect is 250  copies / mL. A negative result does not preclude SARS-CoV-2 infection  and should not be used as the sole basis for treatment or other  patient management decisions.  A negative result may occur with  improper specimen collection / handling, submission of specimen other  than nasopharyngeal swab, presence of viral mutation(s) within the  areas targeted by this assay, and inadequate number of viral copies  (<250 copies / mL). A negative result must be combined with clinical   observations, patient history, and epidemiological information. If result is POSITIVE SARS-CoV-2 target nucleic acids are DETECTED. The SARS-CoV-2 RNA is generally detectable in upper and lower  respiratory specimens dur ing the acute phase of infection.  Positive  results are indicative of active infection with SARS-CoV-2.  Clinical  correlation with patient history and other diagnostic information is  necessary to determine patient infection status.  Positive results do  not rule out bacterial infection or co-infection with other viruses. If result is PRESUMPTIVE POSTIVE SARS-CoV-2 nucleic acids MAY BE PRESENT.   A presumptive positive result was obtained on the submitted specimen  and confirmed on repeat testing.  While 2019 novel coronavirus  (SARS-CoV-2) nucleic acids may be present in the submitted sample  additional confirmatory testing may be necessary for epidemiological  and / or clinical management purposes  to differentiate between  SARS-CoV-2 and other Sarbecovirus currently known to infect humans.  If clinically indicated additional testing with an alternate test  methodology [phone]) is advised. The SARS-CoV-2 RNA is generally  detectable in upper and lower respiratory sp ecimens during the acute  phase of infection. The expected result is Negative. Fact Sheet for Patients:  StrictlyIdeas.no Fact Sheet for Healthcare Providers: BankingDealers.co.za This test is not yet approved or cleared by the [place] FDA and has been authorized for detection and/or diagnosis of SARS-CoV-2 by FDA under an Emergency Use Authorization (EUA).  This EUA will remain in effect (meaning this test can be used) for the duration of the COVID-19 declaration under Section 564(b)(1) of the Act, 21 U.S.C. section 360bbb-3(b)(1), unless the authorization is terminated or revoked sooner. Performed at [facility], [place] [address]  [place], [place] [address]          Radiology Studies: Dg Chest 2 View  Result Date: 11/24/2018 CLINICAL DATA:  Pt reports she had a televisit with MD today who advised her to come to ED. Pt here for eval of fluid retention (has not been taking lasix as directed), pt also reports decrease in urination with some shortness of breath. Pt had .*comment was truncated*sob EXAM: CHEST - 2 VIEW COMPARISON:  Radiograph 11/21/2018 FINDINGS: Normal cardiac silhouette. Some improvement in the bilateral effusions. Persistent effusion on the RIGHT. Mild central venous congestion. No focal infiltrate. No overt pulmonary edema. IMPRESSION: 1. Central venous congestion and RIGHT pleural effusion. 2. Effusions and venous congestion appear slightly  improved from prior but overall similar. Electronically Signed   By: Suzy Bouchard M.D.   On: 11/24/2018 18:34   US Renal  Result Date: 11/24/2018 CLINICAL DATA:  Renal failure EXAM: RENAL / URINARY TRACT ULTRASOUND COMPLETE COMPARISON:  None. FINDINGS: Right Kidney: Renal measurements: 10.3 x 4.4 x 5.3 cm = volume: 126 mL. Mildly echogenic. No hydronephrosis or mass. Left Kidney: Renal measurements: 10.3 x 4.9 x 4.6 cm = volume: 121 mL. Mildly echogenic. No solid renal mass. Lower pole cyst measures 1.4 x 1.4 x 1.4 cm. Bladder: Appears normal for degree of bladder distention. IMPRESSION: 1. Mildly hyperechoic kidneys, compatible with chronic medical renal disease. 2. No hydronephrosis. Electronically Signed   By: Ulyses Jarred M.D.   On: 11/24/2018 18:56      Scheduled Meds: . aspirin  81 mg Oral Daily  . atorvastatin  40 mg Oral q1800  . insulin aspart  0-5 Units Subcutaneous QHS  . insulin aspart  0-9 Units Subcutaneous TID WC  . nystatin  5 mL Mouth/Throat QID  . pantoprazole  40 mg Oral Daily  . ticagrelor  90 mg Oral BID   Continuous Infusions: . DOBUTamine 2.5 mcg/kg/min (11/24/18 2323)     LOS: 2 days      Debbe Odea, MD [facility]  Pager: [email or web address] Password [email or web address] 11/26/2018, 7:24 AM

## 2018-11-26 NOTE — Progress Notes (Signed)
Advanced Heart Failure Rounding Note   Subjective:    Remains on dobutamine 2.5. Off diuretics.   Feels ok. Denies CP, SOB, orthopnea, PND or dizziness. Creatinine continues to increase.   Weight down 2 pounds. 111-> 109.    Objective:   Weight Range:  Vital Signs:   Temp:  [97.6 F (36.4 C)-98.4 F (36.9 C)] 98.3 F (36.8 C) (05/31 1120) Pulse Rate:  [75-93] 93 (05/31 1120) Resp:  [16-36] 36 (05/31 1120) BP: (97-114)/(61-83) 114/83 (05/31 1120) SpO2:  [100 %] 100 % (05/31 1120) Weight:  [49.9 kg] 49.9 kg (05/31 0516) Last BM Date: 11/25/18  Weight change: Filed Weights   11/25/18 0005 11/25/18 0423 11/26/18 0516  Weight: 50.7 kg 50.7 kg 49.9 kg    Intake/Output:   Intake/Output Summary (Last 24 hours) at 11/26/2018 1219 Last data filed at 11/26/2018 0053 Gross per 24 hour  Intake 80.42 ml  Output 900 ml  Net -819.58 ml     Physical Exam: General:  Elderly woman. Lying flat in bed No resp difficulty HEENT: normal Neck: supple. JVP hard to assess ? 6-7 . Carotids 2+ bilat; no bruits. No lymphadenopathy or thryomegaly appreciated. Cor: PMI nondisplaced. Regular rate & rhythm. No rubs, gallops or murmurs. Lungs: clear with decreased BS throughout.  Abdomen: soft, nontender, nondistended. No hepatosplenomegaly. No bruits or masses. Good bowel sounds. Extremities: no cyanosis, clubbing, rash, edema Neuro: alert & orientedx3, cranial nerves grossly intact. moves all 4 extremities w/o difficulty. Affect pleasant  Telemetry: NSR 80-90s Personally reviewed   Labs: Basic Metabolic Panel: Recent Labs  Lab 11/24/18 1402 11/24/18 1715 11/25/18 0250 11/26/18 0406  NA 138 140 140 139  K 4.4 4.9 4.7 4.1  CL 113* 114* 113* 112*  CO2 13* 10* 14* 15*  GLUCOSE 132* 116* 85 103*  BUN 70* 72* 76* 77*  CREATININE 4.13* 4.38* 4.57* 4.86*  CALCIUM 9.1 9.5 9.2 9.1  MG  --   --  1.4* 2.1    Liver Function Tests: No results for input(s): AST, ALT, ALKPHOS, BILITOT,  PROT, ALBUMIN in the last 168 hours. No results for input(s): LIPASE, AMYLASE in the last 168 hours. No results for input(s): AMMONIA in the last 168 hours.  CBC: Recent Labs  Lab 11/24/18 1715 11/25/18 0250  WBC 7.1 7.2  HGB 10.1* 9.2*  HCT 32.4* 28.3*  MCV 86.6 82.3  PLT 249 207    Cardiac Enzymes: Recent Labs  Lab 11/24/18 1715  TROPONINI 0.03*    BNP: BNP (last 3 results) Recent Labs    08/06/18 0628 09/16/18 0342 11/24/18 1715  BNP 1,535.2* 1,254.7* 4,046.9*    ProBNP (last 3 results) No results for input(s): PROBNP in the last 8760 hours.    Other results:  Imaging: Dg Chest 2 View  Result Date: 11/24/2018 CLINICAL DATA:  Pt reports she had a televisit with MD today who advised her to come to ED. Pt here for eval of fluid retention (has not been taking lasix as directed), pt also reports decrease in urination with some shortness of breath. Pt had .*comment was truncated*sob EXAM: CHEST - 2 VIEW COMPARISON:  Radiograph 11/21/2018 FINDINGS: Normal cardiac silhouette. Some improvement in the bilateral effusions. Persistent effusion on the RIGHT. Mild central venous congestion. No focal infiltrate. No overt pulmonary edema. IMPRESSION: 1. Central venous congestion and RIGHT pleural effusion. 2. Effusions and venous congestion appear slightly improved from prior but overall similar. Electronically Signed   By: Suzy Bouchard M.D.   On:  11/24/2018 18:34   US Renal  Result Date: 11/24/2018 CLINICAL DATA:  Renal failure EXAM: RENAL / URINARY TRACT ULTRASOUND COMPLETE COMPARISON:  None. FINDINGS: Right Kidney: Renal measurements: 10.3 x 4.4 x 5.3 cm = volume: 126 mL. Mildly echogenic. No hydronephrosis or mass. Left Kidney: Renal measurements: 10.3 x 4.9 x 4.6 cm = volume: 121 mL. Mildly echogenic. No solid renal mass. Lower pole cyst measures 1.4 x 1.4 x 1.4 cm. Bladder: Appears normal for degree of bladder distention. IMPRESSION: 1. Mildly hyperechoic kidneys,  compatible with chronic medical renal disease. 2. No hydronephrosis. Electronically Signed   By: Ulyses Jarred M.D.   On: 11/24/2018 18:56      Medications:     Scheduled Medications: . aspirin  81 mg Oral Daily  . atorvastatin  40 mg Oral q1800  . insulin aspart  0-5 Units Subcutaneous QHS  . insulin aspart  0-9 Units Subcutaneous TID WC  . nystatin  5 mL Mouth/Throat QID  . pantoprazole  40 mg Oral Daily  . ticagrelor  90 mg Oral BID     Infusions: . DOBUTamine 2.5 mcg/kg/min (11/24/18 2323)     PRN Medications:     Assessment/plan:   1. AKI on CKD 4 - baseline creatinine 1.9-2.0 - creatinine 4.6 -> 4.86 today. Suspect low output HF (? Cold/dry) +/= progression of intrinsic renal failure -  Renal u/s 5/29 Mildly hyperechoic kidneys, compatible with chronic medical renal disease. - Continue dobutamine at 2.5 - Hold lasix and other HF meds  - Volume status ok. Acidosis improving. No uremia so no acute indications for HD - Will follow renal function. Plan on RHC to assess hemodynamics and output.   - D/w Renal as curbside. If not improving will consult formally - though I do not think she can tolerate long-term HD  2. Acute/Chronicsystolicheart failure - Suspect mixed ICM. NICM. S/p PCI 80% LAD on 12/15. -ECHO 08/06/18 EF 20-25% - Echo 11/25/18 EF 25-30% -StableNYHAIV symptoms currently. -Hard to assess volume status. Suspect cold/dry - Continue dobutamine at 2.5 - Hold lasix and other HF meds  - RHC in am   3.CAD s/pNSTEMI  - 08/10/18 LHC with DES to 80% mLAD - Continueticagrelor + ASA.Denies bleeding - No s/s ischemia  4. Pulmonary Hypertension  - Mixed pHTN. PVR 4.7. Not candidate for selective pulmonary artery vasodilators at this point.  5. COPD -She reports tobacco cessation. No change.   6. DMII -Per PCP - Hgb A1C 10.2during recent admission - Continue SSI   Length of Stay: 2   Glori Bickers MD 11/26/2018, 12:19 PM   Advanced Heart Failure Team Pager [phone] (M-F; [place])  Please contact [facility] for night-coverage after hours (4p -7a ) and weekends on [email or web address]

## 2018-11-26 NOTE — Progress Notes (Signed)
AT shift change with day RN, assessed Left Upper arm Iv site, found it to be swollen and echymotic. Per day shift rn it has been the same since she had her. Verified with CN, and confirmed it about looks the same as yesterday. PIV was flushing and had blood return, pt did said it was little painful. Started New Iv on right AC. Left UE piv d/ced, arm elevated . Pt refused some ice-packs. Will continue to monitor.

## 2018-11-27 ENCOUNTER — Encounter (HOSPITAL_COMMUNITY)
Admission: EM | Disposition: A | Discharge status: Home Health | Payer: Self-pay | Source: Home / Self Care | Attending: Internal Medicine

## 2018-11-27 ENCOUNTER — Encounter (HOSPITAL_COMMUNITY): Payer: Self-pay | Admitting: Internal Medicine

## 2018-11-27 DIAGNOSIS — I5023 Acute on chronic systolic (congestive) heart failure: Secondary | ICD-10-CM

## 2018-11-27 DIAGNOSIS — N179 Acute kidney failure, unspecified: Secondary | ICD-10-CM

## 2018-11-27 HISTORY — PX: RIGHT HEART CATH: CATH118263

## 2018-11-27 HISTORY — DX: Acute kidney failure, unspecified: N17.9

## 2018-11-27 LAB — POCT I-STAT EG7
Acid-base deficit: 11 mmol/L — ABNORMAL HIGH (ref 0.0–2.0)
Bicarbonate: 14.1 mmol/L — ABNORMAL LOW (ref 20.0–28.0)
Calcium, Ion: 1.28 mmol/L (ref 1.15–1.40)
HCT: 25 % — ABNORMAL LOW (ref 36.0–46.0)
Hemoglobin: 8.5 g/dL — ABNORMAL LOW (ref 12.0–15.0)
O2 Saturation: 60 %
Potassium: 3.9 mmol/L (ref 3.5–5.1)
Sodium: 141 mmol/L (ref 135–145)
TCO2: 15 mmol/L — ABNORMAL LOW (ref 22–32)
pCO2, Ven: 27.6 mmHg — ABNORMAL LOW (ref 44.0–60.0)
pH, Ven: 7.317 (ref 7.250–7.430)
pO2, Ven: 33 mmHg (ref 32.0–45.0)

## 2018-11-27 LAB — BASIC METABOLIC PANEL WITH GFR
Anion gap: 10 (ref 5–15)
BUN: 67 mg/dL — ABNORMAL HIGH (ref 8–23)
CO2: 16 mmol/L — ABNORMAL LOW (ref 22–32)
Calcium: 8.8 mg/dL — ABNORMAL LOW (ref 8.9–10.3)
Chloride: 111 mmol/L (ref 98–111)
Creatinine, Ser: 4.73 mg/dL — ABNORMAL HIGH (ref 0.44–1.00)
GFR calc Af Amer: 10 mL/min — ABNORMAL LOW
GFR calc non Af Amer: 9 mL/min — ABNORMAL LOW
Glucose, Bld: 126 mg/dL — ABNORMAL HIGH (ref 70–99)
Potassium: 3.8 mmol/L (ref 3.5–5.1)
Sodium: 137 mmol/L (ref 135–145)

## 2018-11-27 LAB — GLUCOSE, CAPILLARY
Glucose-Capillary: 111 mg/dL — ABNORMAL HIGH (ref 70–99)
Glucose-Capillary: 94 mg/dL (ref 70–99)
Glucose-Capillary: 95 mg/dL (ref 70–99)
Glucose-Capillary: 140 mg/dL — ABNORMAL HIGH (ref 70–99)

## 2018-11-27 SURGERY — RIGHT HEART CATH
Anesthesia: LOCAL

## 2018-11-27 MED ORDER — MIDAZOLAM HCL 2 MG/2ML IJ SOLN
INTRAMUSCULAR | Status: DC | PRN
  Administered 2018-11-27: 1 mg via INTRAVENOUS

## 2018-11-27 MED ORDER — HEPARIN (PORCINE) IN NACL 1000-0.9 UT/500ML-% IV SOLN
INTRAVENOUS | Status: DC | PRN
  Administered 2018-11-27: 500 mL

## 2018-11-27 MED ORDER — LIDOCAINE HCL (PF) 1 % IJ SOLN
INTRAMUSCULAR | Status: AC
  Filled 2018-11-27: qty 30

## 2018-11-27 MED ORDER — LIDOCAINE HCL (PF) 1 % IJ SOLN
INTRAMUSCULAR | Status: DC | PRN
  Administered 2018-11-27: 2 mL

## 2018-11-27 MED ORDER — FENTANYL CITRATE (PF) 100 MCG/2ML IJ SOLN
INTRAMUSCULAR | Status: AC
  Filled 2018-11-27: qty 2

## 2018-11-27 MED ORDER — FENTANYL CITRATE (PF) 100 MCG/2ML IJ SOLN
INTRAMUSCULAR | Status: DC | PRN
  Administered 2018-11-27: 25 ug via INTRAVENOUS

## 2018-11-27 MED ORDER — GLUCERNA SHAKE PO LIQD
237.0000 mL | Freq: Three times a day (TID) | ORAL | Status: DC
  Administered 2018-11-27 – 2018-12-02 (×12): 237 mL via ORAL

## 2018-11-27 MED ORDER — MIDAZOLAM HCL 2 MG/2ML IJ SOLN
INTRAMUSCULAR | Status: AC
  Filled 2018-11-27: qty 2

## 2018-11-27 MED ORDER — HEPARIN (PORCINE) IN NACL 1000-0.9 UT/500ML-% IV SOLN
INTRAVENOUS | Status: AC
  Filled 2018-11-27: qty 500

## 2018-11-27 SURGICAL SUPPLY — 8 items
CATH BALLN WEDGE 5F 110CM (CATHETERS) ×2 IMPLANT
COVER DOME SNAP 22 D (MISCELLANEOUS) ×2 IMPLANT
PACK CARDIAC CATHETERIZATION (CUSTOM PROCEDURE TRAY) ×2 IMPLANT
SHEATH GLIDE SLENDER 4/5FR (SHEATH) ×2 IMPLANT
TRANSDUCER W/STOPCOCK (MISCELLANEOUS) ×2 IMPLANT
TUBING ART PRESS 72  MALE/FEM (TUBING) ×1
TUBING ART PRESS 72 MALE/FEM (TUBING) ×1 IMPLANT
WIRE EMERALD 3MM-J .025X260CM (WIRE) ×2 IMPLANT

## 2018-11-27 NOTE — H&P (View-Only) (Signed)
Advanced Heart Failure Rounding Note   Subjective:    Remains on dobutamine 2.5. Still off diuretics.   Feels ok. Denies SOB, orthopnea or PND. PIV site infiltrated overnight.   Weight up 1 pound. Creatinine down slightly 4.8 -> 4.7  Objective:   Weight Range:  Vital Signs:   Temp:  [97.6 F (36.4 C)-98.3 F (36.8 C)] 98.3 F (36.8 C) (06/01 0555) Pulse Rate:  [74-98] 98 (06/01 0609) Resp:  [16-36] 31 (06/01 0609) BP: (90-114)/(62-83) 104/62 (06/01 0555) SpO2:  [98 %-100 %] 98 % (06/01 0609) Weight:  [50.1 kg] 50.1 kg (06/01 0609) Last BM Date: 11/25/18  Weight change: Filed Weights   11/25/18 0423 11/26/18 0516 11/27/18 0609  Weight: 50.7 kg 49.9 kg 50.1 kg    Intake/Output:   Intake/Output Summary (Last 24 hours) at 11/27/2018 0825 Last data filed at 11/27/2018 0300 Gross per 24 hour  Intake 170.2 ml  Output 300 ml  Net -129.8 ml     Physical Exam: General:  Elderly woman. Lying flat in bed No resp difficulty HEENT: normal Neck: supple. JVP looks down but hard to see Carotids 2+ bilat; no bruits. No lymphadenopathy or thryomegaly appreciated. Cor: PMI nondisplaced. Regular rate & rhythm. No rubs, gallops or murmurs. Lungs: clear with decreased BS throughout Abdomen: soft, nontender, nondistended. No hepatosplenomegaly. No bruits or masses. Good bowel sounds. Extremities: no cyanosis, clubbing, rash, edema Neuro: alert & orientedx3, cranial nerves grossly intact. moves all 4 extremities w/o difficulty. Affect pleasant   Telemetry: NSR 90s Personally reviewed   Labs: Basic Metabolic Panel: Recent Labs  Lab 11/24/18 1402 11/24/18 1715 11/25/18 0250 11/26/18 0406 11/27/18 0426  NA 138 140 140 139 137  K 4.4 4.9 4.7 4.1 3.8  CL 113* 114* 113* 112* 111  CO2 13* 10* 14* 15* 16*  GLUCOSE 132* 116* 85 103* 126*  BUN 70* 72* 76* 77* 67*  CREATININE 4.13* 4.38* 4.57* 4.86* 4.73*  CALCIUM 9.1 9.5 9.2 9.1 8.8*  MG  --   --  1.4* 2.1  --     Liver  Function Tests: No results for input(s): AST, ALT, ALKPHOS, BILITOT, PROT, ALBUMIN in the last 168 hours. No results for input(s): LIPASE, AMYLASE in the last 168 hours. No results for input(s): AMMONIA in the last 168 hours.  CBC: Recent Labs  Lab 11/24/18 1715 11/25/18 0250  WBC 7.1 7.2  HGB 10.1* 9.2*  HCT 32.4* 28.3*  MCV 86.6 82.3  PLT 249 207    Cardiac Enzymes: Recent Labs  Lab 11/24/18 1715  TROPONINI 0.03*    BNP: BNP (last 3 results) Recent Labs    08/06/18 0628 09/16/18 0342 11/24/18 1715  BNP 1,535.2* 1,254.7* 4,046.9*    ProBNP (last 3 results) No results for input(s): PROBNP in the last 8760 hours.    Other results:  Imaging: No results found.   Medications:     Scheduled Medications: . aspirin  81 mg Oral Daily  . atorvastatin  40 mg Oral q1800  . enoxaparin (LOVENOX) injection  30 mg Subcutaneous Q24H  . insulin aspart  0-5 Units Subcutaneous QHS  . insulin aspart  0-9 Units Subcutaneous TID WC  . nystatin  5 mL Mouth/Throat QID  . pantoprazole  40 mg Oral Daily  . sodium chloride flush  3 mL Intravenous Q12H  . ticagrelor  90 mg Oral BID    Infusions: . sodium chloride    . sodium chloride 10 mL/hr at 11/27/18 0610  . DOBUTamine  2.5 mcg/kg/min (11/24/18 2323)    PRN Medications:     Assessment/plan:   1. AKI on CKD 4 - baseline creatinine 1.9-2.0 - creatinine 4.6 -> 4.86 -> 4.7 today. Suspect low output HF (? Cold/dry) +/= progression of intrinsic renal failure (or both) -  Renal u/s 5/29 Mildly hyperechoic kidneys, compatible with chronic medical renal disease. - Continue dobutamine at 2.5 - Hold lasix and other HF meds  - Volume status ok. Acidosis improving. No uremia so no acute indications for HD - Plan RHC to further assess and plan next steps. Continue dobutamine for now.  - D/w Renal as curbside. If not improving will consult formally - though I do not think she can tolerate long-term HD  2.  Acute/Chronicsystolicheart failure - Suspect mixed ICM. NICM. S/p PCI 80% LAD on 12/15. -ECHO 08/06/18 EF 20-25% - Echo 11/25/18 EF 25-30% -StableNYHAIV symptoms currently. -Hard to assess volume status. Suspect initially cold/dry - Continue dobutamine at 2.5 - Hold lasix and other HF meds  - RHC today - we had planned femoral vein access with possible need for HD in future but she has refused   3.CAD s/pNSTEMI  - 08/10/18 LHC with DES to 80% mLAD - Continueticagrelor + ASA.Denies bleeding - No s/s ischemia  4. Pulmonary Hypertension  - Mixed pHTN. PVR 4.7. Not candidate for selective pulmonary artery vasodilators at this point.  5. COPD -She reports tobacco cessation. No change.   6. DMII -Per PCP - Hgb A1C 10.2during recent admission - Continue SSI   Length of Stay: 3   Glori Bickers MD 11/27/2018, 8:25 AM  Advanced Heart Failure Team Pager [phone] (M-F; [place])  Please contact [facility] for night-coverage after hours (4p -7a ) and weekends on [email or web address]

## 2018-11-27 NOTE — Progress Notes (Signed)
Pt on dobutamine gtt, transported to cath lab with this RN with cath lab staff. VSS.

## 2018-11-27 NOTE — Interval H&P Note (Signed)
History and Physical Interval Note:  11/27/2018 8:29 AM  [name]  has presented today for surgery, with the diagnosis of HF.  The various methods of treatment have been discussed with the patient and family. After consideration of risks, benefits and other options for treatment, the patient has consented to  Procedure(s): RIGHT HEART CATH (N/A) as a surgical intervention.  The patient's history has been reviewed, patient examined, no change in status, stable for surgery.  I have reviewed the patient's chart and labs.  Questions were answered to the patient's satisfaction.     Daniel Bensimhon

## 2018-11-27 NOTE — Progress Notes (Signed)
Initial Nutrition Assessment  RD working remotely.  DOCUMENTATION CODES:   Not applicable, unable to assess for malnutrition at this time  INTERVENTION:   - Glucerna Shake po TID, each supplement provides 220 kcal and 10 grams of protein  NUTRITION DIAGNOSIS:   Increased nutrient needs related to chronic illness (COPD, CHF) as evidenced by estimated needs.  GOAL:   Patient will meet greater than or equal to 90% of their needs  MONITOR:   PO intake, Supplement acceptance, Labs, Weight trends, I & O's  REASON FOR ASSESSMENT:   Malnutrition Screening Tool    ASSESSMENT:   68 year old female who presented to the ED on 5/29 with SOB and worsening renal function. PMH of CHF, COPD, T2DM, CKD stage III, HTN. Pt admitted with AKI in the setting of CHF exacerbation and CKD.  6/01 - right heart cath  No meal completions recorded since admission.  Unable to reach pt via phone call to room.  Reviewed RD notes from pt's previous admissions. In March of this year, pt was received Glucerna oral nutrition supplements. RD will order Glucerna Shake po TID to aid pt in meeting kcal and protein needs.  Pt diagnoses with moderate malnutrition by RD on 09/18/18. Suspect malnutrition persists given continued weight loss since that date but unable to confirm without NFPE or detailed diet history.  Reviewed weight history in chart. Pt with progressive weight loss since 07/30/18. Overall, pt with 8.3 kg in less than 4 months. This is a 9.9% weight loss which is significant for timeframe.  Medications reviewed and include: SSI, Nystatin, Protonix, Dobutamine  Labs reviewed: BUN 67 (H), creatinine 4.73 (H) CBG's: 94-131 x 24 hours  UOP: 300 ml x 24 hours I/O's: -1.2 L since admit  NUTRITION - FOCUSED PHYSICAL EXAM:  Unable to complete at this time. RD working remotely.  Diet Order:   Diet Order            Diet Heart Room service appropriate? Yes; Fluid consistency: Thin  Diet effective  now              EDUCATION NEEDS:   No education needs have been identified at this time  Skin:  Skin Assessment: Reviewed RN Assessment  Last BM:  11/25/18  Height:   Ht Readings from Last 1 Encounters:  11/25/18 5\' 2"  (1.575 m)    Weight:   Wt Readings from Last 1 Encounters:  11/27/18 50.1 kg    Ideal Body Weight:  50 kg  BMI:  Body mass index is 20.2 kg/m.  Estimated Nutritional Needs:   Kcal:  1450-1650  Protein:  65-80 grams  Fluid:  >/= 1.5 L    Gaynell Face, MS, RD, LDN Inpatient Clinical Dietitian Pager: [phone] Weekend/After Hours: [phone]

## 2018-11-27 NOTE — Progress Notes (Signed)
Advanced Heart Failure Rounding Note   Subjective:    Remains on dobutamine 2.5. Still off diuretics.   Feels ok. Denies SOB, orthopnea or PND. PIV site infiltrated overnight.   Weight up 1 pound. Creatinine down slightly 4.8 -> 4.7  Objective:   Weight Range:  Vital Signs:   Temp:  [97.6 F (36.4 C)-98.3 F (36.8 C)] 98.3 F (36.8 C) (06/01 0555) Pulse Rate:  [74-98] 98 (06/01 0609) Resp:  [16-36] 31 (06/01 0609) BP: (90-114)/(62-83) 104/62 (06/01 0555) SpO2:  [98 %-100 %] 98 % (06/01 0609) Weight:  [50.1 kg] 50.1 kg (06/01 0609) Last BM Date: 11/25/18  Weight change: Filed Weights   11/25/18 0423 11/26/18 0516 11/27/18 0609  Weight: 50.7 kg 49.9 kg 50.1 kg    Intake/Output:   Intake/Output Summary (Last 24 hours) at 11/27/2018 0825 Last data filed at 11/27/2018 0300 Gross per 24 hour  Intake 170.2 ml  Output 300 ml  Net -129.8 ml     Physical Exam: General:  Elderly woman. Lying flat in bed No resp difficulty HEENT: normal Neck: supple. JVP looks down but hard to see Carotids 2+ bilat; no bruits. No lymphadenopathy or thryomegaly appreciated. Cor: PMI nondisplaced. Regular rate & rhythm. No rubs, gallops or murmurs. Lungs: clear with decreased BS throughout Abdomen: soft, nontender, nondistended. No hepatosplenomegaly. No bruits or masses. Good bowel sounds. Extremities: no cyanosis, clubbing, rash, edema Neuro: alert & orientedx3, cranial nerves grossly intact. moves all 4 extremities w/o difficulty. Affect pleasant   Telemetry: NSR 90s Personally reviewed   Labs: Basic Metabolic Panel: Recent Labs  Lab 11/24/18 1402 11/24/18 1715 11/25/18 0250 11/26/18 0406 11/27/18 0426  NA 138 140 140 139 137  K 4.4 4.9 4.7 4.1 3.8  CL 113* 114* 113* 112* 111  CO2 13* 10* 14* 15* 16*  GLUCOSE 132* 116* 85 103* 126*  BUN 70* 72* 76* 77* 67*  CREATININE 4.13* 4.38* 4.57* 4.86* 4.73*  CALCIUM 9.1 9.5 9.2 9.1 8.8*  MG  --   --  1.4* 2.1  --     Liver  Function Tests: No results for input(s): AST, ALT, ALKPHOS, BILITOT, PROT, ALBUMIN in the last 168 hours. No results for input(s): LIPASE, AMYLASE in the last 168 hours. No results for input(s): AMMONIA in the last 168 hours.  CBC: Recent Labs  Lab 11/24/18 1715 11/25/18 0250  WBC 7.1 7.2  HGB 10.1* 9.2*  HCT 32.4* 28.3*  MCV 86.6 82.3  PLT 249 207    Cardiac Enzymes: Recent Labs  Lab 11/24/18 1715  TROPONINI 0.03*    BNP: BNP (last 3 results) Recent Labs    08/06/18 0628 09/16/18 0342 11/24/18 1715  BNP 1,535.2* 1,254.7* 4,046.9*    ProBNP (last 3 results) No results for input(s): PROBNP in the last 8760 hours.    Other results:  Imaging: No results found.   Medications:     Scheduled Medications: . aspirin  81 mg Oral Daily  . atorvastatin  40 mg Oral q1800  . enoxaparin (LOVENOX) injection  30 mg Subcutaneous Q24H  . insulin aspart  0-5 Units Subcutaneous QHS  . insulin aspart  0-9 Units Subcutaneous TID WC  . nystatin  5 mL Mouth/Throat QID  . pantoprazole  40 mg Oral Daily  . sodium chloride flush  3 mL Intravenous Q12H  . ticagrelor  90 mg Oral BID    Infusions: . sodium chloride    . sodium chloride 10 mL/hr at 11/27/18 0610  . DOBUTamine  2.5 mcg/kg/min (11/24/18 2323)    PRN Medications:     Assessment/plan:   1. AKI on CKD 4 - baseline creatinine 1.9-2.0 - creatinine 4.6 -> 4.86 -> 4.7 today. Suspect low output HF (? Cold/dry) +/= progression of intrinsic renal failure (or both) -  Renal u/s 5/29 Mildly hyperechoic kidneys, compatible with chronic medical renal disease. - Continue dobutamine at 2.5 - Hold lasix and other HF meds  - Volume status ok. Acidosis improving. No uremia so no acute indications for HD - Plan RHC to further assess and plan next steps. Continue dobutamine for now.  - D/w Renal as curbside. If not improving will consult formally - though I do not think she can tolerate long-term HD  2.  Acute/Chronicsystolicheart failure - Suspect mixed ICM. NICM. S/p PCI 80% LAD on 12/15. -ECHO 08/06/18 EF 20-25% - Echo 11/25/18 EF 25-30% -StableNYHAIV symptoms currently. -Hard to assess volume status. Suspect initially cold/dry - Continue dobutamine at 2.5 - Hold lasix and other HF meds  - RHC today - we had planned femoral vein access with possible need for HD in future but she has refused   3.CAD s/pNSTEMI  - 08/10/18 LHC with DES to 80% mLAD - Continueticagrelor + ASA.Denies bleeding - No s/s ischemia  4. Pulmonary Hypertension  - Mixed pHTN. PVR 4.7. Not candidate for selective pulmonary artery vasodilators at this point.  5. COPD -She reports tobacco cessation. No change.   6. DMII -Per PCP - Hgb A1C 10.2during recent admission - Continue SSI   Length of Stay: 3   Glori Bickers MD 11/27/2018, 8:25 AM  Advanced Heart Failure Team Pager [phone] (M-F; [place])  Please contact [facility] for night-coverage after hours (4p -7a ) and weekends on [email or web address]

## 2018-11-27 NOTE — Progress Notes (Signed)
PROGRESS NOTE    [name]   ION:[id number]  DOB: 28-Jan-1951  DOA: 11/24/2018 PCP: [name], MD   Brief Narrative:  [name] is a 68 y.o. female with medical history significant of DM2, ichronic systolic CHF with a EF of 20%, CAD with stent to prox LAD in 2/20, CKD 3 with baseline creatinine of 2, COPD/ chronic respiratory failure on 1 to 2 L of nasal cannula only at night.  She presents with dyspnea that has not improved with oral Lasix increased from 40 to 80 mg daily 2 days prior to admission by cardiology. She was told to come into the ED for AKI in setting of fluid overload.  CXR> central venous congestion, effusions only slightly improved from prior Renal US> Mildly hyperechoic kidneys, compatible with chronic medical renal disease  SBP in low 100s Pulse ox 100% on 3 L Cr 4.38 with baseline being ~ 2 LA> 2.0 BNP 4,046 Trop > 0.03  Subjective: Evaluated this AM. She had no complaints.     Assessment & Plan:   Principal Problem:   AKI (acute kidney injury) on CKD 3 with metabolic acidosis Acute on Chronic systolic CHF (congestive heart failure)-  Pulmonary HTN   - AKI due to low cardiac output in setting of Lasix, Aldactone and Losartan use- all 3 being held - renal ultrasound consistent with medical renal disease - heart failure team assisting with management and Dobutamine has been started to increase cardiac output -  Having right heart cath today - defer nephrology consult to cardiology team  Active Problems: Hypotension/ Lactic acidosis - Coreg being held - Lactic acidosis resolved  Elevated troponin - mild elevation at 0.03 related to above AKI and heart failure-   Short 5 beat run of V tach - on 5/30- Coreg on hold currently by cardiology    Diabetes mellitus 2   - last A1c 9.3 on 3/22- poor control with nephropathy - she states she no longer takes long acting insulin- will follow sugars int he hospital and see if we need to initiate this -  SSI  ordered- sugars are well controlled - hold Glucophage  Thrush - possibly related to uncontrolled DM - started Nystatin  GERD - has heart burn frequently- treat thrush which may be extending into esophagus with Nystatin swish and swallow - cont Protonix daily    CAD (coronary artery disease), native coronary artery - cont ASA and Brillinta - cont Atorvastatin    COPD (chronic obstructive pulmonary disease)  - no exacerbation noted     Time spent in minutes: 35 DVT prophylaxis: SCDs Code Status: Full code Family Communication:  Disposition Plan: f/u Cr- PT recommends HHPT  Consultants:   Heart failure team Procedures:   2 D ECHO 1. The left ventricle has severely reduced systolic function, with an ejection fraction of 25-30%. The cavity size was normal. Indeterminate diastolic filling due to E-A fusion.  2. LVEF is severely depressed at approximately 25% with hypokinesi of the mid LV; akinesis of the distal 1/3 of LV No significant change in LVEF from echo report from Feb 2020.  3. The right ventricle has normal systolic function. The cavity was normal. There is no increase in right ventricular wall thickness.  4. Left atrial size was mildly dilated.  5. Trivial pericardial effusion is present.  6. The mitral valve is abnormal. Mild thickening of the mitral valve leaflet.  7. The aortic valve is tricuspid. Mild thickening of the aortic valve. Mild calcification of the  aortic valve.  8. The interatrial septum was not assessed.  Antimicrobials:  Anti-infectives (From admission, onward)   None       Objective: Vitals:   11/27/18 0555 11/27/18 0609 11/27/18 0836 11/27/18 0908  BP: 104/62  109/72   Pulse: 80 98 81   Resp: 19 (!) 31 17   Temp: 98.3 F (36.8 C)  98.4 F (36.9 C)   TempSrc: Oral  Oral   SpO2: 100% 98% 100% 98%  Weight:  50.1 kg    Height:        Intake/Output Summary (Last 24 hours) at 11/27/2018 0942 Last data filed at 11/27/2018 0300 Gross per 24  hour  Intake 170.2 ml  Output 300 ml  Net -129.8 ml   Filed Weights   11/25/18 0423 11/26/18 0516 11/27/18 0609  Weight: 50.7 kg 49.9 kg 50.1 kg    Examination: General exam: Appears comfortable  HEENT: PERRLA, oral mucosa moist, no sclera icterus or thrush Respiratory system: Clear to auscultation. Respiratory effort normal. Cardiovascular system: S1 & S2 heard,  No murmurs  Gastrointestinal system: Abdomen soft, non-tender, nondistended. Normal bowel sounds   Central nervous system: Alert and oriented. No focal neurological deficits. Extremities: No cyanosis, clubbing or edema Skin: No rashes or ulcers Psychiatry:  Mood & affect appropriate.   Data Reviewed: I have personally reviewed following labs and imaging studies  CBC: Recent Labs  Lab 11/24/18 1715 11/25/18 0250  WBC 7.1 7.2  HGB 10.1* 9.2*  HCT 32.4* 28.3*  MCV 86.6 82.3  PLT 249 326   Basic Metabolic Panel: Recent Labs  Lab 11/24/18 1402 11/24/18 1715 11/25/18 0250 11/26/18 0406 11/27/18 0426  NA 138 140 140 139 137  K 4.4 4.9 4.7 4.1 3.8  CL 113* 114* 113* 112* 111  CO2 13* 10* 14* 15* 16*  GLUCOSE 132* 116* 85 103* 126*  BUN 70* 72* 76* 77* 67*  CREATININE 4.13* 4.38* 4.57* 4.86* 4.73*  CALCIUM 9.1 9.5 9.2 9.1 8.8*  MG  --   --  1.4* 2.1  --    GFR: Estimated Creatinine Clearance: 8.8 mL/min (A) (by C-G formula based on SCr of 4.73 mg/dL (H)). Liver Function Tests: No results for input(s): AST, ALT, ALKPHOS, BILITOT, PROT, ALBUMIN in the last 168 hours. No results for input(s): LIPASE, AMYLASE in the last 168 hours. No results for input(s): AMMONIA in the last 168 hours. Coagulation Profile: No results for input(s): INR, PROTIME in the last 168 hours. Cardiac Enzymes: Recent Labs  Lab 11/24/18 1715  TROPONINI 0.03*   BNP (last 3 results) No results for input(s): PROBNP in the last 8760 hours. HbA1C: No results for input(s): HGBA1C in the last 72 hours. CBG: Recent Labs  Lab 11/26/18  0825 11/26/18 1119 11/26/18 1659 11/26/18 2131 11/27/18 0637  GLUCAP 90 127* 131* 126* 111*   Lipid Profile: No results for input(s): CHOL, HDL, LDLCALC, TRIG, CHOLHDL, LDLDIRECT in the last 72 hours. Thyroid Function Tests: No results for input(s): TSH, T4TOTAL, FREET4, T3FREE, THYROIDAB in the last 72 hours. Anemia Panel: No results for input(s): VITAMINB12, FOLATE, FERRITIN, TIBC, IRON, RETICCTPCT in the last 72 hours. Urine analysis:    Component Value Date/Time   COLORURINE STRAW (A) 08/10/2018 2055   APPEARANCEUR CLEAR 08/10/2018 2055   LABSPEC 1.009 08/10/2018 2055   PHURINE 5.0 08/10/2018 2055   GLUCOSEU NEGATIVE 08/10/2018 2055   HGBUR NEGATIVE 08/10/2018 2055   [place] NEGATIVE 08/10/2018 2055   [place] 08/10/2018 2055   PROTEINUR NEGATIVE  08/10/2018 2055   NITRITE NEGATIVE 08/10/2018 2055   LEUKOCYTESUR NEGATIVE 08/10/2018 2055   Sepsis Labs: @LABRCNTIP (procalcitonin:4,lacticidven:4) ) Recent Results (from the past 240 hour(s))  SARS Coronavirus 2 (CEPHEID - Performed in [place] hospital lab), Hosp Order     Status: None   Collection Time: 11/24/18  8:27 PM  Result Value Ref Range Status   SARS Coronavirus 2 NEGATIVE NEGATIVE Final    Comment: (NOTE) If result is NEGATIVE SARS-CoV-2 target nucleic acids are NOT DETECTED. The SARS-CoV-2 RNA is generally detectable in upper and lower  respiratory specimens during the acute phase of infection. The lowest  concentration of SARS-CoV-2 viral copies this assay can detect is 250  copies / mL. A negative result does not preclude SARS-CoV-2 infection  and should not be used as the sole basis for treatment or other  patient management decisions.  A negative result may occur with  improper specimen collection / handling, submission of specimen other  than nasopharyngeal swab, presence of viral mutation(s) within the  areas targeted by this assay, and inadequate number of viral copies  (<250 copies /  mL). A negative result must be combined with clinical  observations, patient history, and epidemiological information. If result is POSITIVE SARS-CoV-2 target nucleic acids are DETECTED. The SARS-CoV-2 RNA is generally detectable in upper and lower  respiratory specimens dur ing the acute phase of infection.  Positive  results are indicative of active infection with SARS-CoV-2.  Clinical  correlation with patient history and other diagnostic information is  necessary to determine patient infection status.  Positive results do  not rule out bacterial infection or co-infection with other viruses. If result is PRESUMPTIVE POSTIVE SARS-CoV-2 nucleic acids MAY BE PRESENT.   A presumptive positive result was obtained on the submitted specimen  and confirmed on repeat testing.  While 2019 novel coronavirus  (SARS-CoV-2) nucleic acids may be present in the submitted sample  additional confirmatory testing may be necessary for epidemiological  and / or clinical management purposes  to differentiate between  SARS-CoV-2 and other Sarbecovirus currently known to infect humans.  If clinically indicated additional testing with an alternate test  methodology [phone]) is advised. The SARS-CoV-2 RNA is generally  detectable in upper and lower respiratory sp ecimens during the acute  phase of infection. The expected result is Negative. Fact Sheet for Patients:  StrictlyIdeas.no Fact Sheet for Healthcare Providers: BankingDealers.co.za This test is not yet approved or cleared by the [place] FDA and has been authorized for detection and/or diagnosis of SARS-CoV-2 by FDA under an Emergency Use Authorization (EUA).  This EUA will remain in effect (meaning this test can be used) for the duration of the COVID-19 declaration under Section 564(b)(1) of the Act, 21 U.S.C. section 360bbb-3(b)(1), unless the authorization is terminated or revoked sooner.  Performed at [facility], [place] [address] [place], [place] [address]          Radiology Studies: No results found.    Scheduled Meds: . [MAR Hold] aspirin  81 mg Oral Daily  . [MAR Hold] atorvastatin  40 mg Oral q1800  . [MAR Hold] enoxaparin (LOVENOX) injection  30 mg Subcutaneous Q24H  . [MAR Hold] insulin aspart  0-5 Units Subcutaneous QHS  . [MAR Hold] insulin aspart  0-9 Units Subcutaneous TID WC  . [MAR Hold] nystatin  5 mL Mouth/Throat QID  . [MAR Hold] pantoprazole  40 mg Oral Daily  . sodium chloride flush  3 mL Intravenous Q12H  . [facility]  Hold] ticagrelor  90 mg Oral BID   Continuous Infusions: . sodium chloride    . sodium chloride 10 mL/hr at 11/27/18 0610  . DOBUTamine 2.5 mcg/kg/min (11/24/18 2323)     LOS: 3 days      Debbe Odea, MD [facility] Pager: [email or web address] Password [facility] 11/27/2018, 9:42 AM

## 2018-11-28 ENCOUNTER — Other Ambulatory Visit: Payer: Self-pay

## 2018-11-28 LAB — URINALYSIS, ROUTINE W REFLEX MICROSCOPIC
Bilirubin Urine: NEGATIVE
Glucose, UA: NEGATIVE mg/dL
Ketones, ur: NEGATIVE mg/dL
Nitrite: NEGATIVE
Protein, ur: NEGATIVE mg/dL
Specific Gravity, Urine: 1.008 (ref 1.005–1.030)
WBC, UA: >50 WBC/hpf — ABNORMAL HIGH (ref 0–5)
pH: 5 (ref 5.0–8.0)

## 2018-11-28 LAB — POCT I-STAT EG7
Acid-base deficit: 11 mmol/L — ABNORMAL HIGH (ref 0.0–2.0)
Bicarbonate: 13.7 mmol/L — ABNORMAL LOW (ref 20.0–28.0)
Calcium, Ion: 1.19 mmol/L (ref 1.15–1.40)
HCT: 25 % — ABNORMAL LOW (ref 36.0–46.0)
Hemoglobin: 8.5 g/dL — ABNORMAL LOW (ref 12.0–15.0)
O2 Saturation: 58 %
Potassium: 3.8 mmol/L (ref 3.5–5.1)
Sodium: 144 mmol/L (ref 135–145)
TCO2: 15 mmol/L — ABNORMAL LOW (ref 22–32)
pCO2, Ven: 27 mmHg — ABNORMAL LOW (ref 44.0–60.0)
pH, Ven: 7.314 (ref 7.250–7.430)
pO2, Ven: 32 mmHg (ref 32.0–45.0)

## 2018-11-28 LAB — BASIC METABOLIC PANEL
Anion gap: 12 (ref 5–15)
BUN: 60 mg/dL — ABNORMAL HIGH (ref 8–23)
CO2: 16 mmol/L — ABNORMAL LOW (ref 22–32)
Calcium: 9 mg/dL (ref 8.9–10.3)
Chloride: 110 mmol/L (ref 98–111)
Creatinine, Ser: 4.53 mg/dL — ABNORMAL HIGH (ref 0.44–1.00)
GFR calc Af Amer: 11 mL/min — ABNORMAL LOW (ref >60)
GFR calc non Af Amer: 9 mL/min — ABNORMAL LOW (ref >60)
Glucose, Bld: 142 mg/dL — ABNORMAL HIGH (ref 70–99)
Potassium: 3.9 mmol/L (ref 3.5–5.1)
Sodium: 138 mmol/L (ref 135–145)

## 2018-11-28 LAB — GLUCOSE, CAPILLARY
Glucose-Capillary: 117 mg/dL — ABNORMAL HIGH (ref 70–99)
Glucose-Capillary: 151 mg/dL — ABNORMAL HIGH (ref 70–99)
Glucose-Capillary: 109 mg/dL — ABNORMAL HIGH (ref 70–99)

## 2018-11-28 MED ORDER — DARBEPOETIN ALFA 100 MCG/0.5ML IJ SOSY
100.0000 ug | PREFILLED_SYRINGE | Freq: Once | INTRAMUSCULAR | Status: AC
  Administered 2018-11-28: 100 ug via SUBCUTANEOUS
  Filled 2018-11-28: qty 0.5

## 2018-11-28 MED ORDER — FUROSEMIDE 80 MG PO TABS
80.0000 mg | ORAL_TABLET | Freq: Once | ORAL | Status: AC
  Administered 2018-11-28: 80 mg via ORAL
  Filled 2018-11-28: qty 1

## 2018-11-28 NOTE — Progress Notes (Signed)
Physical Therapy Treatment Patient Details Name: [name] MRN: [id number] DOB: [birth year] Today's Date: 11/28/2018    History of Present Illness Pt adm with acute hypoxic respiratory failure due to acute kidney injury. PMH - heart failure, copd, cad, ckd, dm, NSTEMI    PT Comments    Patient seen for mobility progression with good motivation to participate. Patient ambulating progressive distances with rollator in hallway with general supervision level assist for safety - no physical assist needed. Patient making good progress towards all goals. Will continue to follow.     Follow Up Recommendations  Home health PT     Equipment Recommendations  Rolling walker with 5" wheels    Recommendations for Other Services       Precautions / Restrictions Precautions Precautions: Fall Restrictions Weight Bearing Restrictions: No    Mobility  Bed Mobility Overal bed mobility: Modified Independent                Transfers Overall transfer level: Needs assistance Equipment used: 4-wheeled walker Transfers: Sit to/from Stand Sit to Stand: Supervision         General transfer comment: for safety - no LOB  Ambulation/Gait Ambulation/Gait assistance: Supervision Gait Distance (Feet): 250 Feet Assistive device: 4-wheeled walker Gait Pattern/deviations: Step-through pattern;Decreased stride length Gait velocity: decreased    General Gait Details: steady pace of gait; no subjective complaints; close supervision for safety   Stairs             Wheelchair Mobility    Modified Rankin (Stroke Patients Only)       Balance Overall balance assessment: Needs assistance Sitting-balance support: No upper extremity supported;Feet supported Sitting balance-Leahy Scale: Good     Standing balance support: Bilateral upper extremity supported;During functional activity Standing balance-Leahy Scale: Fair                              Cognition  Arousal/Alertness: Awake/alert Behavior During Therapy: WFL for tasks assessed/performed Overall Cognitive Status: Within Functional Limits for tasks assessed                                        Exercises      General Comments        Pertinent Vitals/Pain Pain Assessment: No/denies pain    Home Living                      Prior Function            PT Goals (current goals can now be found in the care plan section) Acute Rehab PT Goals Patient Stated Goal: to go home  PT Goal Formulation: With patient Time For Goal Achievement: 12/02/18 Potential to Achieve Goals: Good Progress towards PT goals: Progressing toward goals    Frequency    Min 2X/week      PT Plan Current plan remains appropriate    Co-evaluation              AM-PAC PT "6 Clicks" Mobility   Outcome Measure  Help needed turning from your back to your side while in a flat bed without using bedrails?: None Help needed moving from lying on your back to sitting on the side of a flat bed without using bedrails?: None Help needed moving to and from a bed to a chair (including a wheelchair)?: A  Little Help needed standing up from a chair using your arms (e.g., wheelchair or bedside chair)?: A Little Help needed to walk in hospital room?: A Little Help needed climbing 3-5 steps with a railing? : A Little 6 Click Score: 20    End of Session Equipment Utilized During Treatment: Gait belt Activity Tolerance: Patient tolerated treatment well Patient left: in chair;with call bell/phone within reach Nurse Communication: Mobility status PT Visit Diagnosis: Unsteadiness on feet (R26.81);Other abnormalities of gait and mobility (R26.89);Muscle weakness (generalized) (M62.81)     Time: 2301-7209 PT Time Calculation (min) (ACUTE ONLY): 19 min  Charges:  $Gait Training: 8-22 mins                      Lanney Gins, PT, DPT Supplemental Physical Therapist 11/28/18 12:30  PM Pager: [phone] Office: [phone]

## 2018-11-28 NOTE — Progress Notes (Signed)
Advanced Heart Failure Rounding Note   Subjective:    Remains on dobutamine 2.5. RHC yesterday with moderately elevated filling pressures and moderately decreased output  RA = 7 RV = 11/7 PA = 55/18 (33) PCW = 25 (v = 35) Fick cardiac output/index = 4.0/2.7 PVR = 1.9 WU FA sat = 98% PA sat = 58%, 60%  Feels ok. Weak. No orthopnea or PND. Weight stable at 110  Creatinine down slightly 4.8 -> 4.7 -> 4.5  Objective:   Weight Range:  Vital Signs:   Temp:  [98.1 F (36.7 C)-98.7 F (37.1 C)] 98.3 F (36.8 C) (06/02 1140) Pulse Rate:  [78-92] 85 (06/02 0826) Resp:  [16-22] 22 (06/02 1140) BP: (96-127)/(58-78) 100/66 (06/02 1140) SpO2:  [99 %-100 %] 100 % (06/02 1140) Weight:  [50.2 kg] 50.2 kg (06/02 0518) Last BM Date: 11/24/18  Weight change: Filed Weights   11/26/18 0516 11/27/18 0609 11/28/18 0518  Weight: 49.9 kg 50.1 kg 50.2 kg    Intake/Output:   Intake/Output Summary (Last 24 hours) at 11/28/2018 1328 Last data filed at 11/28/2018 1021 Gross per 24 hour  Intake 360 ml  Output 600 ml  Net -240 ml     Physical Exam: General:  Elderly woman. Lying flat in bed No resp difficulty HEENT: normal Neck: supple. JVP 7-8 Carotids 2+ bilat; no bruits. No lymphadenopathy or thryomegaly appreciated. Cor: PMI nondisplaced. Regular rate & rhythm. No rubs, gallops or murmurs. Lungs: clear Abdomen: soft, nontender, nondistended. No hepatosplenomegaly. No bruits or masses. Good bowel sounds. Extremities: no cyanosis, clubbing, rash, edema Neuro: alert & orientedx3, cranial nerves grossly intact. moves all 4 extremities w/o difficulty. Affect pleasant    Telemetry: NSR 80-90s Personally reviewed   Labs: Basic Metabolic Panel: Recent Labs  Lab 11/24/18 1715 11/25/18 0250 11/26/18 0406 11/27/18 0426 11/27/18 0937 11/28/18 0444  NA 140 140 139 137 141 138  K 4.9 4.7 4.1 3.8 3.9 3.9  CL 114* 113* 112* 111  --  110  CO2 10* 14* 15* 16*  --  16*  GLUCOSE 116*  85 103* 126*  --  142*  BUN 72* 76* 77* 67*  --  60*  CREATININE 4.38* 4.57* 4.86* 4.73*  --  4.53*  CALCIUM 9.5 9.2 9.1 8.8*  --  9.0  MG  --  1.4* 2.1  --   --   --     Liver Function Tests: No results for input(s): AST, ALT, ALKPHOS, BILITOT, PROT, ALBUMIN in the last 168 hours. No results for input(s): LIPASE, AMYLASE in the last 168 hours. No results for input(s): AMMONIA in the last 168 hours.  CBC: Recent Labs  Lab 11/24/18 1715 11/25/18 0250 11/27/18 0937  WBC 7.1 7.2  --   HGB 10.1* 9.2* 8.5*  HCT 32.4* 28.3* 25.0*  MCV 86.6 82.3  --   PLT 249 207  --     Cardiac Enzymes: Recent Labs  Lab 11/24/18 1715  TROPONINI 0.03*    BNP: BNP (last 3 results) Recent Labs    08/06/18 0628 09/16/18 0342 11/24/18 1715  BNP 1,535.2* 1,254.7* 4,046.9*    ProBNP (last 3 results) No results for input(s): PROBNP in the last 8760 hours.    Other results:  Imaging: No results found.   Medications:     Scheduled Medications: . aspirin  81 mg Oral Daily  . atorvastatin  40 mg Oral q1800  . enoxaparin (LOVENOX) injection  30 mg Subcutaneous Q24H  . feeding supplement (GLUCERNA  SHAKE)  237 mL Oral TID BM  . insulin aspart  0-5 Units Subcutaneous QHS  . insulin aspart  0-9 Units Subcutaneous TID WC  . nystatin  5 mL Mouth/Throat QID  . pantoprazole  40 mg Oral Daily  . ticagrelor  90 mg Oral BID    Infusions: . DOBUTamine 2.5 mcg/kg/min (11/24/18 2323)    PRN Medications:     Assessment/plan:   1. AKI on CKD 4 - baseline creatinine 1.9-2.0 - creatinine 4.6 -> 4.86 -> 4.7 -> 4.5  today -  Renal u/s 5/29 Mildly hyperechoic kidneys, compatible with chronic medical renal Disease. - RHC 6/1 as above with moderately elevated filling pressures and moderately decreased output - Continue dobutamine at 2.5 - Will give a dose of po lasix today - Remains acidotic and I feel she likely has progression of her intrinsic renal disease. I have discussed with Dr.  Blanchard Mane (Renal) and she will see   2. Acute/Chronicsystolicheart failure - Suspect mixed ICM. NICM. S/p PCI 80% LAD on 12/15. -ECHO 08/06/18 EF 20-25% - Echo 11/25/18 EF 25-30% -StableNYHAIV symptoms currently. - RHC as above - Continue dobutamine at 2.5 - Give one dose lasix today. Hold other HF meds    3.CAD s/pNSTEMI  - 08/10/18 LHC with DES to 80% mLAD - Continueticagrelor + ASA.Denies bleeding - No s/s ischemia  4. Pulmonary Hypertension  - Mixed pHTN. PVR 4.7. Not candidate for selective pulmonary artery vasodilators at this point.  5. COPD -She reports tobacco cessation. No change.   6. DMII -Per PCP - Hgb A1C 10.2during recent admission - Continue SSI   Length of Stay: 4   Caelin Rosen MD 11/28/2018, 1:28 PM  Advanced Heart Failure Team Pager [phone] (M-F; [place])  Please contact [facility] for night-coverage after hours (4p -7a ) and weekends on [email or web address]

## 2018-11-28 NOTE — Progress Notes (Signed)
PROGRESS NOTE    [name]   HWE:[id number]  DOB: 03-12-1951  DOA: 11/24/2018 PCP: [name], MD   Brief Narrative:  [name] is a 68 y.o. female with medical history significant of DM2, ichronic systolic CHF with a EF of 20%, CAD with stent to prox LAD in 2/20, CKD 3 with baseline creatinine of 2, COPD/ chronic respiratory failure on 1 to 2 L of nasal cannula only at night.  She presents with dyspnea that has not improved with oral Lasix increased from 40 to 80 mg daily 2 days prior to admission by cardiology. She was told to come into the ED for AKI in setting of fluid overload.  CXR> central venous congestion, effusions only slightly improved from prior Renal US> Mildly hyperechoic kidneys, compatible with chronic medical renal disease  SBP in low 100s Pulse ox 100% on 3 L Cr 4.38 with baseline being ~ 2 LA> 2.0 BNP 4,046 Trop > 0.03  Subjective: She had no complaints today.     Assessment & Plan:   Principal Problem:   AKI (acute kidney injury) on CKD 3 with metabolic acidosis Acute on Chronic systolic CHF (congestive heart failure)-  Pulmonary HTN   - AKI due to low cardiac output in setting of Lasix, Aldactone and Losartan use- all 3 being held - renal ultrasound consistent with medical renal disease - heart failure team assisting with management and Dobutamine has been started to increase cardiac output - s/p right heart cath showing moderately elevated pulmonary pressure-  - cont dobutamine  Active Problems: Hypotension/ Lactic acidosis - Coreg being held - Lactic acidosis resolved  Elevated troponin - mild elevation at 0.03 related to above AKI and heart failure-   Short 5 beat run of V tach - on 5/30- Coreg on hold currently by cardiology    Diabetes mellitus 2   - last A1c 9.3 on 3/22- poor control with nephropathy - she states she no longer takes long acting insulin- will follow sugars int he hospital and see if we need to initiate this -  SSI  ordered- sugars are well controlled - holding Glucophage  Thrush - possibly related to uncontrolled DM - started Nystatin- improving  GERD - has heart burn frequently- treat thrush which may be extending into esophagus with Nystatin swish and swallow - cont Protonix daily    CAD (coronary artery disease), native coronary artery - cont ASA and Brillinta - cont Atorvastatin    COPD (chronic obstructive pulmonary disease)  - no exacerbation noted     Time spent in minutes: 35 DVT prophylaxis: SCDs Code Status: Full code Family Communication:  Disposition Plan: f/u Cr- PT recommends HHPT  Consultants:   Heart failure team Procedures:   2 D ECHO 1. The left ventricle has severely reduced systolic function, with an ejection fraction of 25-30%. The cavity size was normal. Indeterminate diastolic filling due to E-A fusion.  2. LVEF is severely depressed at approximately 25% with hypokinesi of the mid LV; akinesis of the distal 1/3 of LV No significant change in LVEF from echo report from Feb 2020.  3. The right ventricle has normal systolic function. The cavity was normal. There is no increase in right ventricular wall thickness.  4. Left atrial size was mildly dilated.  5. Trivial pericardial effusion is present.  6. The mitral valve is abnormal. Mild thickening of the mitral valve leaflet.  7. The aortic valve is tricuspid. Mild thickening of the aortic valve. Mild calcification of the aortic  valve.  8. The interatrial septum was not assessed.  Antimicrobials:  Anti-infectives (From admission, onward)   None       Objective: Vitals:   11/28/18 0000 11/28/18 0400 11/28/18 0518 11/28/18 0826  BP: 111/69 (!) 96/58  109/66  Pulse: 79 78  85  Resp: 18 17  18   Temp: 98.6 F (37 C) 98.1 F (36.7 C)  98.2 F (36.8 C)  TempSrc: Oral Oral  Oral  SpO2: 100% 100%  99%  Weight:   50.2 kg   Height:        Intake/Output Summary (Last 24 hours) at 11/28/2018 1025 Last data  filed at 11/28/2018 1021 Gross per 24 hour  Intake 360 ml  Output 600 ml  Net -240 ml   Filed Weights   11/26/18 0516 11/27/18 0609 11/28/18 0518  Weight: 49.9 kg 50.1 kg 50.2 kg    Examination: General exam: Appears comfortable  HEENT: PERRLA, oral mucosa moist, no sclera icterus or thrush Respiratory system: Clear to auscultation. Respiratory effort normal. Cardiovascular system: S1 & S2 heard,  No murmurs  Gastrointestinal system: Abdomen soft, non-tender, nondistended. Normal bowel sounds   Central nervous system: Alert and oriented. No focal neurological deficits. Extremities: No cyanosis, clubbing or edema Skin: No rashes or ulcers Psychiatry:  Mood & affect appropriate.   Data Reviewed: I have personally reviewed following labs and imaging studies  CBC: Recent Labs  Lab 11/24/18 1715 11/25/18 0250 11/27/18 0937  WBC 7.1 7.2  --   HGB 10.1* 9.2* 8.5*  HCT 32.4* 28.3* 25.0*  MCV 86.6 82.3  --   PLT 249 207  --    Basic Metabolic Panel: Recent Labs  Lab 11/24/18 1715 11/25/18 0250 11/26/18 0406 11/27/18 0426 11/27/18 0937 11/28/18 0444  NA 140 140 139 137 141 138  K 4.9 4.7 4.1 3.8 3.9 3.9  CL 114* 113* 112* 111  --  110  CO2 10* 14* 15* 16*  --  16*  GLUCOSE 116* 85 103* 126*  --  142*  BUN 72* 76* 77* 67*  --  60*  CREATININE 4.38* 4.57* 4.86* 4.73*  --  4.53*  CALCIUM 9.5 9.2 9.1 8.8*  --  9.0  MG  --  1.4* 2.1  --   --   --    GFR: Estimated Creatinine Clearance: 9.1 mL/min (A) (by C-G formula based on SCr of 4.53 mg/dL (H)). Liver Function Tests: No results for input(s): AST, ALT, ALKPHOS, BILITOT, PROT, ALBUMIN in the last 168 hours. No results for input(s): LIPASE, AMYLASE in the last 168 hours. No results for input(s): AMMONIA in the last 168 hours. Coagulation Profile: No results for input(s): INR, PROTIME in the last 168 hours. Cardiac Enzymes: Recent Labs  Lab 11/24/18 1715  TROPONINI 0.03*   BNP (last 3 results) No results for  input(s): PROBNP in the last 8760 hours. HbA1C: No results for input(s): HGBA1C in the last 72 hours. CBG: Recent Labs  Lab 11/27/18 0637 11/27/18 1304 11/27/18 1748 11/27/18 2227 11/28/18 0648  GLUCAP 111* 94 95 140* 117*   Lipid Profile: No results for input(s): CHOL, HDL, LDLCALC, TRIG, CHOLHDL, LDLDIRECT in the last 72 hours. Thyroid Function Tests: No results for input(s): TSH, T4TOTAL, FREET4, T3FREE, THYROIDAB in the last 72 hours. Anemia Panel: No results for input(s): VITAMINB12, FOLATE, FERRITIN, TIBC, IRON, RETICCTPCT in the last 72 hours. Urine analysis:    Component Value Date/Time   COLORURINE STRAW (A) 08/10/2018 2055   APPEARANCEUR CLEAR 08/10/2018 2055  LABSPEC 1.009 08/10/2018 2055   PHURINE 5.0 08/10/2018 2055   GLUCOSEU NEGATIVE 08/10/2018 2055   HGBUR NEGATIVE 08/10/2018 2055   BILIRUBINUR NEGATIVE 08/10/2018 2055   [place] NEGATIVE 08/10/2018 2055   PROTEINUR NEGATIVE 08/10/2018 2055   NITRITE NEGATIVE 08/10/2018 2055   LEUKOCYTESUR NEGATIVE 08/10/2018 2055   Sepsis Labs: @LABRCNTIP (procalcitonin:4,lacticidven:4) ) Recent Results (from the past 240 hour(s))  SARS Coronavirus 2 (CEPHEID - Performed in [place] hospital lab), Hosp Order     Status: None   Collection Time: 11/24/18  8:27 PM  Result Value Ref Range Status   SARS Coronavirus 2 NEGATIVE NEGATIVE Final    Comment: (NOTE) If result is NEGATIVE SARS-CoV-2 target nucleic acids are NOT DETECTED. The SARS-CoV-2 RNA is generally detectable in upper and lower  respiratory specimens during the acute phase of infection. The lowest  concentration of SARS-CoV-2 viral copies this assay can detect is 250  copies / mL. A negative result does not preclude SARS-CoV-2 infection  and should not be used as the sole basis for treatment or other  patient management decisions.  A negative result may occur with  improper specimen collection / handling, submission of specimen other  than  nasopharyngeal swab, presence of viral mutation(s) within the  areas targeted by this assay, and inadequate number of viral copies  (<250 copies / mL). A negative result must be combined with clinical  observations, patient history, and epidemiological information. If result is POSITIVE SARS-CoV-2 target nucleic acids are DETECTED. The SARS-CoV-2 RNA is generally detectable in upper and lower  respiratory specimens dur ing the acute phase of infection.  Positive  results are indicative of active infection with SARS-CoV-2.  Clinical  correlation with patient history and other diagnostic information is  necessary to determine patient infection status.  Positive results do  not rule out bacterial infection or co-infection with other viruses. If result is PRESUMPTIVE POSTIVE SARS-CoV-2 nucleic acids MAY BE PRESENT.   A presumptive positive result was obtained on the submitted specimen  and confirmed on repeat testing.  While 2019 novel coronavirus  (SARS-CoV-2) nucleic acids may be present in the submitted sample  additional confirmatory testing may be necessary for epidemiological  and / or clinical management purposes  to differentiate between  SARS-CoV-2 and other Sarbecovirus currently known to infect humans.  If clinically indicated additional testing with an alternate test  methodology [id number]) is advised. The SARS-CoV-2 RNA is generally  detectable in upper and lower respiratory sp ecimens during the acute  phase of infection. The expected result is Negative. Fact Sheet for Patients:  StrictlyIdeas.no Fact Sheet for Healthcare Providers: BankingDealers.co.za This test is not yet approved or cleared by the [place] FDA and has been authorized for detection and/or diagnosis of SARS-CoV-2 by FDA under an Emergency Use Authorization (EUA).  This EUA will remain in effect (meaning this test can be used) for the duration of the  COVID-19 declaration under Section 564(b)(1) of the Act, 21 U.S.C. section 360bbb-3(b)(1), unless the authorization is terminated or revoked sooner. Performed at [facility], [place] [address], [place], [place] [address]          Radiology Studies: No results found.    Scheduled Meds: . aspirin  81 mg Oral Daily  . atorvastatin  40 mg Oral q1800  . enoxaparin (LOVENOX) injection  30 mg Subcutaneous Q24H  . feeding supplement (GLUCERNA SHAKE)  237 mL Oral TID BM  . insulin aspart  0-5 Units Subcutaneous QHS  . insulin  aspart  0-9 Units Subcutaneous TID WC  . nystatin  5 mL Mouth/Throat QID  . pantoprazole  40 mg Oral Daily  . ticagrelor  90 mg Oral BID   Continuous Infusions: . DOBUTamine 2.5 mcg/kg/min (11/24/18 2323)     LOS: 4 days      Debbe Odea, MD [facility] Pager: [email or web address] Password [email or web address] 11/28/2018, 10:25 AM

## 2018-11-28 NOTE — Consult Note (Signed)
[facility] Renal Consultation Note  Requesting MD: Bensimhon Indication for Consultation: A on CRF   HPI:  [name] is a 68 y.o. female with type 2 DM, COPD, CAD with CHF- EF of 20%.  History is also notable for an acute cardiac event in February of 9528 complicated by cardiac arrest, then another hospitalization from 3/21 to 3/24 with volume overload requiring diuresis.  Her renal function was more like 1.1 to 1.4 prior  To all that - but at discharge this last rime was 1.9 to 2-  Being 2.05 on 09/19/18.  She again developed with increased fluid retention- not taking lasix regularly - was told to double up on it- had labs that showed now creatinine of 4-  Admitted, ARB put on hold-  Started on dobutamine - crt peaked at 4.86 but has trended down slightly the last 3 days to 4.5- cath showed better cardiac output on dobutamine- elevated filling pressures- were to resume lasix.  UOP not well recorded last 2 days - weight steady - BP OK with occasional BPs in the 90s- no BP meds.  Note from her primary cardiologist  'I dont think she could tolerate long term dialysis" . She feels well today, absolutely no complaints- volume status looks good.  Renal ultrasound done on 5/29 showing 10 cm kidneys mildly echogenic   Creatinine, Ser  Date/Time Value Ref Range Status  11/28/2018 04:44 AM 4.53 (H) 0.44 - 1.00 mg/dL Final  11/27/2018 04:26 AM 4.73 (H) 0.44 - 1.00 mg/dL Final  11/26/2018 04:06 AM 4.86 (H) 0.44 - 1.00 mg/dL Final  11/25/2018 02:50 AM 4.57 (H) 0.44 - 1.00 mg/dL Final  11/24/2018 05:15 PM 4.38 (H) 0.44 - 1.00 mg/dL Final  11/24/2018 02:02 PM 4.13 (H) 0.44 - 1.00 mg/dL Final  09/19/2018 02:43 AM 2.05 (H) 0.44 - 1.00 mg/dL Final  09/18/2018 03:08 AM 1.86 (H) 0.44 - 1.00 mg/dL Final  09/17/2018 02:49 AM 1.84 (H) 0.44 - 1.00 mg/dL Final  09/16/2018 03:36 AM 1.96 (H) 0.44 - 1.00 mg/dL Final  08/29/2018 12:21 PM 1.97 (H) 0.44 - 1.00 mg/dL Final  08/17/2018 03:43 AM 1.74 (H) 0.44  - 1.00 mg/dL Final  08/16/2018 05:26 AM 1.74 (H) 0.44 - 1.00 mg/dL Final  08/15/2018 04:01 AM 1.62 (H) 0.44 - 1.00 mg/dL Final  08/14/2018 04:11 AM 1.56 (H) 0.44 - 1.00 mg/dL Final  08/13/2018 09:42 PM 1.55 (H) 0.44 - 1.00 mg/dL Final  08/13/2018 08:58 AM 1.79 (H) 0.44 - 1.00 mg/dL Final  08/12/2018 03:38 AM 1.78 (H) 0.44 - 1.00 mg/dL Final  08/11/2018 06:00 AM 1.43 (H) 0.44 - 1.00 mg/dL Final  08/10/2018 03:40 AM 1.32 (H) 0.44 - 1.00 mg/dL Final  08/09/2018 07:00 PM 1.35 (H) 0.44 - 1.00 mg/dL Final  08/09/2018 05:47 AM 1.31 (H) 0.44 - 1.00 mg/dL Final  08/08/2018 04:57 AM 1.23 (H) 0.44 - 1.00 mg/dL Final  08/07/2018 02:39 AM 1.16 (H) 0.44 - 1.00 mg/dL Final  08/06/2018 06:23 AM 1.11 (H) 0.44 - 1.00 mg/dL Final     PMHx:   Past Medical History:  Diagnosis Date  . CHF (congestive heart failure) ([place])   . COPD (chronic obstructive pulmonary disease) ([place])   . Diabetes mellitus without complication ([place])   . Hypertension   . NSTEMI (non-ST elevated myocardial infarction) [facility])     Past Surgical History:  Procedure Laterality Date  . CORONARY STENT INTERVENTION N/A 08/10/2018   Procedure: CORONARY STENT INTERVENTION;  Surgeon: Leonie Man, MD;  Location: [place]  [facility];  Service: Cardiovascular;  Laterality: N/A;  . RIGHT HEART CATH N/A 11/27/2018   Procedure: RIGHT HEART CATH;  Surgeon: Jolaine Artist, MD;  Location: [place] [facility];  Service: Cardiovascular;  Laterality: N/A;  . RIGHT/LEFT HEART CATH AND CORONARY ANGIOGRAPHY N/A 08/10/2018   Procedure: RIGHT/LEFT HEART CATH AND CORONARY ANGIOGRAPHY;  Surgeon: Leonie Man, MD;  Location: [place] [facility];  Service: Cardiovascular;  Laterality: N/A;    Family Hx:  Family History  Problem Relation Age of Onset  . Breast cancer Neg Hx     Social History:  reports that she has been smoking. She has been smoking about 1.00 pack per day. She has never used smokeless tobacco. She reports current alcohol use. She  reports that she does not use drugs.  Allergies: No Known Allergies  Medications: Prior to Admission medications   Medication Sig Start Date End Date Taking? Authorizing Provider  aspirin 81 MG chewable tablet Chew 1 tablet (81 mg total) by mouth daily. 08/18/18  Yes Swayze, Ava, DO  atorvastatin (LIPITOR) 40 MG tablet Take 1 tablet (40 mg total) by mouth daily at 6 PM. 08/17/18  Yes Swayze, Ava, DO  carvedilol (COREG) 3.125 MG tablet Take 1 tablet (3.125 mg total) by mouth 2 (two) times daily. 08/29/18 11/27/18 Yes Tillery, Satira Mccallum, PA-C  furosemide (LASIX) 40 MG tablet Take 1 tablet (40 mg total) by mouth daily as needed for fluid or edema (take one tab if 3 pound weight gain in 24 hours or 5 pound weight gain in 1 week). 09/19/18 09/19/19 Yes Black, Lezlie Octave, NP  losartan (COZAAR) 25 MG tablet Take 1 tablet (25 mg total) by mouth at bedtime. 08/17/18  Yes Swayze, Ava, DO  Magnesium Oxide 400 (240 Mg) MG TABS Take 1 tablet (400 mg total) by mouth daily. 11/10/18  Yes Bensimhon, Shaune Pascal, MD  megestrol (MEGACE) 40 MG/ML suspension Take 800 mg by mouth daily.   Yes [provider]  metFORMIN (GLUCOPHAGE) 1000 MG tablet Take 1,000 mg by mouth 2 (two) times daily with a meal.   Yes [provider]  Multiple Vitamin (MULTIVITAMIN WITH MINERALS) TABS tablet Take 1 tablet by mouth daily. 08/18/18  Yes Swayze, Ava, DO  pantoprazole (PROTONIX) 40 MG tablet Take 1 tablet (40 mg total) by mouth daily. 08/18/18  Yes Swayze, Ava, DO  spironolactone (ALDACTONE) 25 MG tablet Take 1 tablet (25 mg total) by mouth daily. 08/18/18  Yes Swayze, Ava, DO  ticagrelor (BRILINTA) 90 MG TABS tablet Take 1 tablet (90 mg total) by mouth 2 (two) times daily. 08/17/18  Yes Swayze, Ava, DO    I have reviewed the patient's current medications.  Labs:  Results for orders placed or performed during the hospital encounter of 11/24/18 (from the past 48 hour(s))  Glucose, capillary     Status: Abnormal   Collection  Time: 11/26/18  4:59 PM  Result Value Ref Range   Glucose-Capillary 131 (H) 70 - 99 mg/dL  Glucose, capillary     Status: Abnormal   Collection Time: 11/26/18  9:31 PM  Result Value Ref Range   Glucose-Capillary 126 (H) 70 - 99 mg/dL  Basic metabolic panel     Status: Abnormal   Collection Time: 11/27/18  4:26 AM  Result Value Ref Range   Sodium 137 135 - 145 mmol/L   Potassium 3.8 3.5 - 5.1 mmol/L   Chloride 111 98 - 111 mmol/L   CO2 16 (L) 22 - 32  mmol/L   Glucose, Bld 126 (H) 70 - 99 mg/dL   BUN 67 (H) 8 - 23 mg/dL   Creatinine, Ser 4.73 (H) 0.44 - 1.00 mg/dL   Calcium 8.8 (L) 8.9 - 10.3 mg/dL   GFR calc non Af Amer 9 (L) >60 mL/min   GFR calc Af Amer 10 (L) >60 mL/min   Anion gap 10 5 - 15    Comment: Performed at [facility] [address], [place], [place] [address]  Glucose, capillary     Status: Abnormal   Collection Time: 11/27/18  6:37 AM  Result Value Ref Range   Glucose-Capillary 111 (H) 70 - 99 mg/dL   Comment 1 Notify RN    Comment 2 Document in Chart   POCT I-Stat EG7     Status: Abnormal   Collection Time: 11/27/18  9:37 AM  Result Value Ref Range   pH, Ven 7.317 7.250 - 7.430   pCO2, Ven 27.6 (L) 44.0 - 60.0 mmHg   pO2, Ven 33.0 32.0 - 45.0 mmHg   Bicarbonate 14.1 (L) 20.0 - 28.0 mmol/L   TCO2 15 (L) 22 - 32 mmol/L   O2 Saturation 60.0 %   Acid-base deficit 11.0 (H) 0.0 - 2.0 mmol/L   Sodium 141 135 - 145 mmol/L   Potassium 3.9 3.5 - 5.1 mmol/L   Calcium, Ion 1.28 1.15 - 1.40 mmol/L   HCT 25.0 (L) 36.0 - 46.0 %   Hemoglobin 8.5 (L) 12.0 - 15.0 g/dL   Patient temperature HIDE    Sample type VENOUS   Glucose, capillary     Status: None   Collection Time: 11/27/18  1:04 PM  Result Value Ref Range   Glucose-Capillary 94 70 - 99 mg/dL  Glucose, capillary     Status: None   Collection Time: 11/27/18  5:48 PM  Result Value Ref Range   Glucose-Capillary 95 70 - 99 mg/dL  Glucose, capillary     Status: Abnormal   Collection Time: 11/27/18 10:27 PM   Result Value Ref Range   Glucose-Capillary 140 (H) 70 - 99 mg/dL  Basic metabolic panel     Status: Abnormal   Collection Time: 11/28/18  4:44 AM  Result Value Ref Range   Sodium 138 135 - 145 mmol/L   Potassium 3.9 3.5 - 5.1 mmol/L   Chloride 110 98 - 111 mmol/L   CO2 16 (L) 22 - 32 mmol/L   Glucose, Bld 142 (H) 70 - 99 mg/dL   BUN 60 (H) 8 - 23 mg/dL   Creatinine, Ser 4.53 (H) 0.44 - 1.00 mg/dL   Calcium 9.0 8.9 - 10.3 mg/dL   GFR calc non Af Amer 9 (L) >60 mL/min   GFR calc Af Amer 11 (L) >60 mL/min   Anion gap 12 5 - 15    Comment: Performed at [facility], [place] [address] [place], [place] [address]  Glucose, capillary     Status: Abnormal   Collection Time: 11/28/18  6:48 AM  Result Value Ref Range   Glucose-Capillary 117 (H) 70 - 99 mg/dL  Glucose, capillary     Status: Abnormal   Collection Time: 11/28/18 11:11 AM  Result Value Ref Range   Glucose-Capillary 151 (H) 70 - 99 mg/dL   Comment 1 Notify RN      ROS:  A comprehensive review of systems was negative.  Physical Exam: Vitals:   11/28/18 0826 11/28/18 1140  BP: 109/66 100/66  Pulse: 85  Resp: 18 (!) 22  Temp: 98.2 F (36.8 C) 98.3 F (36.8 C)  SpO2: 99% 100%     General: sitting up in bedside chair- she looks good- no edema- no O2 HEENT: PERRLA, EOMI, mucous membranes moist  Neck: no JVD Heart: RRR Lungs: mostly clear Abdomen: thin, non tender Extremities: no edema Skin: warm and dry Neuro: alert and non focal   Assessment/Plan: 68 year old BF with COPD, ischemic cardiomyopathy- EF 20- had mild CKD prior to now 3 hospitalizations for cardiac issues  1.Renal- pt with mild CKD prior to events - crt 1.1- 1.3- then high ones up to 2 approx 2 mos ago.  Now presents with volume overload - soft BP on ARB slow to resolve but possibly improving the last 48 hours on dobutamine.  Renal ultrasound c/w medical renal disease - but not unexpected in a 68 year old.  Will check U/A but was negative in Feb.   Suspect CKD of 2.0 on the basis of extensive hosp earlier this year.  Acute change over the last 2 mos in the setting of attempted diuresis and on ARB- again suspect hemodynamic issue.  Now off ARB and on dobutamine with improved numbers the last 48 hours and feeling good.  I would continue to hold the ARB- will check U/A for additional clues for etiology of worsening renal disease.  No indications for dialysis- in fact renal function trending better.  I will have her follow up with me post hospitalization   2. Hypertension/volume  - volume status seems good right now, no diuretics.  I anticipate the next steps would be to wean dobutamine and likely will need resumption of diuretics at that time 3. Anemia  - has been dropping slowly since March - will check iron stores and treat with ESA as well    Louis Meckel 11/28/2018, 1:16 PM

## 2018-11-29 LAB — FERRITIN: Ferritin: 328 ng/mL — ABNORMAL HIGH (ref 11–307)

## 2018-11-29 LAB — IRON AND TIBC
Iron: 28 ug/dL (ref 28–170)
Saturation Ratios: 12 % (ref 10.4–31.8)
TIBC: 239 ug/dL — ABNORMAL LOW (ref 250–450)
UIBC: 211 ug/dL

## 2018-11-29 LAB — BASIC METABOLIC PANEL
Anion gap: 12 (ref 5–15)
BUN: 56 mg/dL — ABNORMAL HIGH (ref 8–23)
CO2: 16 mmol/L — ABNORMAL LOW (ref 22–32)
Calcium: 8.7 mg/dL — ABNORMAL LOW (ref 8.9–10.3)
Chloride: 107 mmol/L (ref 98–111)
Creatinine, Ser: 4.1 mg/dL — ABNORMAL HIGH (ref 0.44–1.00)
GFR calc Af Amer: 12 mL/min — ABNORMAL LOW (ref >60)
GFR calc non Af Amer: 10 mL/min — ABNORMAL LOW (ref >60)
Glucose, Bld: 129 mg/dL — ABNORMAL HIGH (ref 70–99)
Potassium: 3.8 mmol/L (ref 3.5–5.1)
Sodium: 135 mmol/L (ref 135–145)

## 2018-11-29 LAB — GLUCOSE, CAPILLARY
Glucose-Capillary: 153 mg/dL — ABNORMAL HIGH (ref 70–99)
Glucose-Capillary: 110 mg/dL — ABNORMAL HIGH (ref 70–99)
Glucose-Capillary: 173 mg/dL — ABNORMAL HIGH (ref 70–99)
Glucose-Capillary: 115 mg/dL — ABNORMAL HIGH (ref 70–99)
Glucose-Capillary: 122 mg/dL — ABNORMAL HIGH (ref 70–99)

## 2018-11-29 MED ORDER — SODIUM CHLORIDE 0.9 % IV SOLN
1.0000 g | INTRAVENOUS | Status: DC
  Administered 2018-11-29: 1 g via INTRAVENOUS
  Filled 2018-11-29: qty 10

## 2018-11-29 MED ORDER — SODIUM CHLORIDE 0.9 % IV SOLN
510.0000 mg | INTRAVENOUS | Status: DC
  Administered 2018-11-29: 510 mg via INTRAVENOUS
  Filled 2018-11-29: qty 17

## 2018-11-29 NOTE — Progress Notes (Signed)
PROGRESS NOTE    [name]   OYD:[id number]  DOB: 1950-09-23  DOA: 11/24/2018 PCP: [name], MD   Brief Narrative:  [name] is a 68 y.o. female with medical history significant of DM2, ichronic systolic CHF with a EF of 20%, CAD with stent to prox LAD in 2/20, CKD 3 with baseline creatinine of 2, COPD/ chronic respiratory failure on 1 to 2 L of nasal cannula only at night.  She presents with dyspnea that has not improved with oral Lasix increased from 40 to 80 mg daily 2 days prior to admission by cardiology. She was told to come into the ED for AKI in setting of fluid overload.  CXR> central venous congestion, effusions only slightly improved from prior Renal US> Mildly hyperechoic kidneys, compatible with chronic medical renal disease  SBP in low 100s Pulse ox 100% on 3 L Cr 4.38 with baseline being ~ 2 LA> 2.0 BNP 4,046 Trop > 0.03  Subjective: She had no complaints today.     Assessment & Plan: AKI (acute kidney injury) on CKD 3  metabolic acidosis Acute on Chronic systolic CHF (congestive heart failure) Pulmonary HTN   - AKI due to low cardiac output in setting of Lasix, Aldactone and Losartan use- all 3 being held - renal ultrasound consistent with medical renal disease - heart failure team assisting with management of CHF and Dobutamine has been started to increase cardiac output - s/p right heart cath showing moderately elevated pulmonary pressure-  - cont dobutamine Nephrology following as well.  Appreciate their assistance as well.  Active Problems: Hypotension/ Lactic acidosis - Coreg being held - Lactic acidosis resolved  Elevated troponin - mild elevation at 0.03 related to above AKI and heart failure-   Short 5 beat run of V tach - on 5/30- Coreg on hold currently by cardiology    Diabetes mellitus 2   - last A1c 9.3 on 3/22- poor control with nephropathy - she states she no longer takes long acting insulin- will follow sugars int he hospital  and see if we need to initiate this -  SSI ordered- sugars are well controlled - holding Glucophage  Thrush - possibly related to uncontrolled DM - started Nystatin- improving  GERD - has heart burn frequently- treat thrush which may be extending into esophagus with Nystatin swish and swallow - cont Protonix daily    CAD (coronary artery disease), native coronary artery - cont ASA and Brillinta - cont Atorvastatin    COPD (chronic obstructive pulmonary disease)  - no exacerbation noted  Asymptomatic bacteriuria. Patient was started on IV ceftriaxone by cardiology but the patient does not have any symptoms. We will discontinue [place]   Time spent in minutes: 35 DVT prophylaxis: SCDs Code Status: Full code Family Communication:  Disposition Plan: f/u Cr- PT recommends HHPT  Consultants:   Heart failure team Procedures:   2 D ECHO 1. The left ventricle has severely reduced systolic function, with an ejection fraction of 25-30%. The cavity size was normal. Indeterminate diastolic filling due to E-A fusion.  2. LVEF is severely depressed at approximately 25% with hypokinesi of the mid LV; akinesis of the distal 1/3 of LV No significant change in LVEF from echo report from Feb 2020.  3. The right ventricle has normal systolic function. The cavity was normal. There is no increase in right ventricular wall thickness.  4. Left atrial size was mildly dilated.  5. Trivial pericardial effusion is present.  6. The mitral valve is  abnormal. Mild thickening of the mitral valve leaflet.  7. The aortic valve is tricuspid. Mild thickening of the aortic valve. Mild calcification of the aortic valve.  8. The interatrial septum was not assessed.  Antimicrobials:  Anti-infectives (From admission, onward)   Start     Dose/Rate Route Frequency Ordered Stop   11/29/18 1500  cefTRIAXone (ROCEPHIN) 1 g in sodium chloride 0.9 % 100 mL IVPB  Status:  Discontinued    Note to Pharmacy:  Pharmacy  to dose.   1 g 200 mL/hr over 30 Minutes Intravenous Every 24 hours 11/29/18 1416 11/29/18 1519       Objective: Vitals:   11/29/18 0500 11/29/18 0755 11/29/18 1135 11/29/18 1635  BP:  108/65 93/65 98/71   Pulse:  97 85 84  Resp:  16 20 19   Temp:  98.9 F (37.2 C) 98.6 F (37 C) 98.3 F (36.8 C)  TempSrc:  Oral Oral Oral  SpO2:  99% 100% 99%  Weight: 50 kg     Height:        Intake/Output Summary (Last 24 hours) at 11/29/2018 1935 Last data filed at 11/29/2018 1825 Gross per 24 hour  Intake 637.08 ml  Output 1600 ml  Net -962.92 ml   Filed Weights   11/27/18 0609 11/28/18 0518 11/29/18 0500  Weight: 50.1 kg 50.2 kg 50 kg    Examination: General exam: Appears comfortable  HEENT: PERRLA, oral mucosa moist, no sclera icterus or thrush Respiratory system: Clear to auscultation. Respiratory effort normal. Cardiovascular system: S1 & S2 heard,  No murmurs  Gastrointestinal system: Abdomen soft, non-tender, nondistended. Normal bowel sounds   Central nervous system: Alert and oriented. No focal neurological deficits. Extremities: No cyanosis, clubbing or edema Skin: No rashes or ulcers Psychiatry:  Mood & affect appropriate.   Data Reviewed: I have personally reviewed following labs and imaging studies  CBC: Recent Labs  Lab 11/24/18 1715 11/25/18 0250 11/27/18 0936 11/27/18 0937  WBC 7.1 7.2  --   --   HGB 10.1* 9.2* 8.5* 8.5*  HCT 32.4* 28.3* 25.0* 25.0*  MCV 86.6 82.3  --   --   PLT 249 207  --   --    Basic Metabolic Panel: Recent Labs  Lab 11/25/18 0250 11/26/18 0406 11/27/18 0426 11/27/18 0936 11/27/18 0937 11/28/18 0444 11/29/18 0331  NA 140 139 137 144 141 138 135  K 4.7 4.1 3.8 3.8 3.9 3.9 3.8  CL 113* 112* 111  --   --  110 107  CO2 14* 15* 16*  --   --  16* 16*  GLUCOSE 85 103* 126*  --   --  142* 129*  BUN 76* 77* 67*  --   --  60* 56*  CREATININE 4.57* 4.86* 4.73*  --   --  4.53* 4.10*  CALCIUM 9.2 9.1 8.8*  --   --  9.0 8.7*  MG 1.4*  2.1  --   --   --   --   --    GFR: Estimated Creatinine Clearance: 10.1 mL/min (A) (by C-G formula based on SCr of 4.1 mg/dL (H)). Liver Function Tests: No results for input(s): AST, ALT, ALKPHOS, BILITOT, PROT, ALBUMIN in the last 168 hours. No results for input(s): LIPASE, AMYLASE in the last 168 hours. No results for input(s): AMMONIA in the last 168 hours. Coagulation Profile: No results for input(s): INR, PROTIME in the last 168 hours. Cardiac Enzymes: Recent Labs  Lab 11/24/18 1715  TROPONINI 0.03*   BNP (  last 3 results) No results for input(s): PROBNP in the last 8760 hours. HbA1C: No results for input(s): HGBA1C in the last 72 hours. CBG: Recent Labs  Lab 11/28/18 1627 11/28/18 2131 11/29/18 0619 11/29/18 1133 11/29/18 1633  GLUCAP 109* 153* 110* 173* 115*   Lipid Profile: No results for input(s): CHOL, HDL, LDLCALC, TRIG, CHOLHDL, LDLDIRECT in the last 72 hours. Thyroid Function Tests: No results for input(s): TSH, T4TOTAL, FREET4, T3FREE, THYROIDAB in the last 72 hours. Anemia Panel: Recent Labs    11/29/18 0331  FERRITIN 328*  TIBC 239*  IRON 28   Urine analysis:    Component Value Date/Time   COLORURINE YELLOW 11/28/2018 2135   APPEARANCEUR HAZY (A) 11/28/2018 2135   LABSPEC 1.008 11/28/2018 2135   PHURINE 5.0 11/28/2018 2135   GLUCOSEU NEGATIVE 11/28/2018 2135   HGBUR MODERATE (A) 11/28/2018 2135   BILIRUBINUR NEGATIVE 11/28/2018 2135   KETONESUR NEGATIVE 11/28/2018 2135   PROTEINUR NEGATIVE 11/28/2018 2135   NITRITE NEGATIVE 11/28/2018 2135   LEUKOCYTESUR LARGE (A) 11/28/2018 2135   Sepsis Labs: @LABRCNTIP (procalcitonin:4,lacticidven:4) ) Recent Results (from the past 240 hour(s))  SARS Coronavirus 2 (CEPHEID - Performed in [place] hospital lab), Hosp Order     Status: None   Collection Time: 11/24/18  8:27 PM  Result Value Ref Range Status   SARS Coronavirus 2 NEGATIVE NEGATIVE Final    Comment: (NOTE) If result is NEGATIVE  SARS-CoV-2 target nucleic acids are NOT DETECTED. The SARS-CoV-2 RNA is generally detectable in upper and lower  respiratory specimens during the acute phase of infection. The lowest  concentration of SARS-CoV-2 viral copies this assay can detect is 250  copies / mL. A negative result does not preclude SARS-CoV-2 infection  and should not be used as the sole basis for treatment or other  patient management decisions.  A negative result may occur with  improper specimen collection / handling, submission of specimen other  than nasopharyngeal swab, presence of viral mutation(s) within the  areas targeted by this assay, and inadequate number of viral copies  (<250 copies / mL). A negative result must be combined with clinical  observations, patient history, and epidemiological information. If result is POSITIVE SARS-CoV-2 target nucleic acids are DETECTED. The SARS-CoV-2 RNA is generally detectable in upper and lower  respiratory specimens dur ing the acute phase of infection.  Positive  results are indicative of active infection with SARS-CoV-2.  Clinical  correlation with patient history and other diagnostic information is  necessary to determine patient infection status.  Positive results do  not rule out bacterial infection or co-infection with other viruses. If result is PRESUMPTIVE POSTIVE SARS-CoV-2 nucleic acids MAY BE PRESENT.   A presumptive positive result was obtained on the submitted specimen  and confirmed on repeat testing.  While 2019 novel coronavirus  (SARS-CoV-2) nucleic acids may be present in the submitted sample  additional confirmatory testing may be necessary for epidemiological  and / or clinical management purposes  to differentiate between  SARS-CoV-2 and other Sarbecovirus currently known to infect humans.  If clinically indicated additional testing with an alternate test  methodology [phone]) is advised. The SARS-CoV-2 RNA is generally  detectable in upper  and lower respiratory sp ecimens during the acute  phase of infection. The expected result is Negative. Fact Sheet for Patients:  StrictlyIdeas.no Fact Sheet for Healthcare Providers: BankingDealers.co.za This test is not yet approved or cleared by the [place] FDA and has been authorized for detection and/or diagnosis of SARS-CoV-2  by FDA under an Emergency Use Authorization (EUA).  This EUA will remain in effect (meaning this test can be used) for the duration of the COVID-19 declaration under Section 564(b)(1) of the Act, 21 U.S.C. section 360bbb-3(b)(1), unless the authorization is terminated or revoked sooner. Performed at [facility], [place] [address] [place], [place] [address]          Radiology Studies: No results found.    Scheduled Meds: . aspirin  81 mg Oral Daily  . atorvastatin  40 mg Oral q1800  . enoxaparin (LOVENOX) injection  30 mg Subcutaneous Q24H  . feeding supplement (GLUCERNA SHAKE)  237 mL Oral TID BM  . insulin aspart  0-5 Units Subcutaneous QHS  . insulin aspart  0-9 Units Subcutaneous TID WC  . nystatin  5 mL Mouth/Throat QID  . pantoprazole  40 mg Oral Daily  . ticagrelor  90 mg Oral BID   Continuous Infusions: . DOBUTamine 2.5 mcg/kg/min (11/24/18 2323)  . ferumoxytol 510 mg (11/29/18 1306)     LOS: 5 days      Berle Mull, MD [facility] Pager: [email or web address] Password [facility] 11/29/2018, 7:35 PM

## 2018-11-29 NOTE — Care Management Important Message (Signed)
Important Message  Patient Details  Name: Smantha Boakye MRN: [id number] Date of Birth: 1950-10-22   Medicare Important Message Given:  Yes    Maleeka Sabatino 11/29/2018, 1:42 PM

## 2018-11-29 NOTE — Progress Notes (Signed)
Subjective:  Given lasix 80 PO yest- 1350 UOP- BUN and crt down again - urine with WBC and RBC - a fair amount - she denies urinary sxms   Objective Vital signs in last 24 hours: Vitals:   11/28/18 2333 11/29/18 0453 11/29/18 0500 11/29/18 0755  BP: 120/76 120/79  108/65  Pulse: 92 94  97  Resp: 20 (!) 21  16  Temp: 98.2 F (36.8 C) 98.4 F (36.9 C)  98.9 F (37.2 C)  TempSrc: Oral Oral  Oral  SpO2: 99% 97%  99%  Weight:   50 kg   Height:       Weight change: -0.2 kg  Intake/Output Summary (Last 24 hours) at 11/29/2018 1039 Last data filed at 11/29/2018 0951 Gross per 24 hour  Intake 720 ml  Output 1400 ml  Net -680 ml    Assessment/Plan: 68 year old BF with COPD, ischemic cardiomyopathy- EF 20- had mild CKD prior to now 3 hospitalizations for cardiac issues  1.Renal- pt with mild CKD prior to events - crt 1.1- 1.3- then high ones up to 2 approx 2 mos ago.  Now presents with volume overload - soft BP on ARB slow to resolve but improving the last 72 hours on dobutamine.  Renal ultrasound c/w medical renal disease - but not unexpected in a 68 year old.  U/A without protein but with RBC and WBC- will send for culture.  Suspect CKD of 2.0 on the basis of extensive hosp earlier this year.  Acute change over the last 2 mos in the setting of attempted diuresis and on ARB- again suspect hemodynamic issue but with active urine will consider other possibilities- send for culture and send serologies.  Now off ARB and on dobutamine with improved numbers the last 72 hours and feeling good.  I would continue to hold the ARB.  No indications for dialysis- in fact renal function trending better.  I will have her follow up with me post hospitalization   2. Hypertension/volume  - volume status seems good right now, no diuretics.  I anticipate the next steps would be to wean dobutamine and likely will need resumption of diuretics at that time- I can leave this to cards 3. Anemia  - has been dropping slowly  since March - iron stores low, will replete and treat with ESA as well    Louis Meckel    Labs: Basic Metabolic Panel: Recent Labs  Lab 11/27/18 0426  11/27/18 0937 11/28/18 0444 11/29/18 0331  NA 137   < > 141 138 135  K 3.8   < > 3.9 3.9 3.8  CL 111  --   --  110 107  CO2 16*  --   --  16* 16*  GLUCOSE 126*  --   --  142* 129*  BUN 67*  --   --  60* 56*  CREATININE 4.73*  --   --  4.53* 4.10*  CALCIUM 8.8*  --   --  9.0 8.7*   < > = values in this interval not displayed.   Liver Function Tests: No results for input(s): AST, ALT, ALKPHOS, BILITOT, PROT, ALBUMIN in the last 168 hours. No results for input(s): LIPASE, AMYLASE in the last 168 hours. No results for input(s): AMMONIA in the last 168 hours. CBC: Recent Labs  Lab 11/24/18 1715 11/25/18 0250 11/27/18 0936 11/27/18 0937  WBC 7.1 7.2  --   --   HGB 10.1* 9.2* 8.5* 8.5*  HCT 32.4*  28.3* 25.0* 25.0*  MCV 86.6 82.3  --   --   PLT 249 207  --   --    Cardiac Enzymes: Recent Labs  Lab 11/24/18 1715  TROPONINI 0.03*   CBG: Recent Labs  Lab 11/28/18 0648 11/28/18 1111 11/28/18 1627 11/28/18 2131 11/29/18 0619  GLUCAP 117* 151* 109* 153* 110*    Iron Studies:  Recent Labs    11/29/18 0331  IRON 28  TIBC 239*  FERRITIN 328*   Studies/Results: No results found. Medications: Infusions: . DOBUTamine 2.5 mcg/kg/min (11/24/18 2323)    Scheduled Medications: . aspirin  81 mg Oral Daily  . atorvastatin  40 mg Oral q1800  . enoxaparin (LOVENOX) injection  30 mg Subcutaneous Q24H  . feeding supplement (GLUCERNA SHAKE)  237 mL Oral TID BM  . insulin aspart  0-5 Units Subcutaneous QHS  . insulin aspart  0-9 Units Subcutaneous TID WC  . nystatin  5 mL Mouth/Throat QID  . pantoprazole  40 mg Oral Daily  . ticagrelor  90 mg Oral BID    have reviewed scheduled and prn medications.  Physical Exam: General: NAD, looks good Heart: RRR Lungs: mostly clear Abdomen: soft, non  tender Extremities: very minimal edema     11/29/2018,10:39 AM  LOS: 5 days

## 2018-11-29 NOTE — Progress Notes (Addendum)
Advanced Heart Failure Rounding Note   Subjective:    RHC 6/1  RA = 7 RV = 11/7 PA = 55/18 (33) PCW = 25 (v = 35) Fick cardiac output/index = 4.0/2.7 PVR = 1.9 WU FA sat = 98% PA sat = 58%, 60%  Remains on dobutamine 2.5. Feels ok. No CP, SOB , orthopnea or PND. Got one dose of po lasix yesterday.  Had about 1.3L out. Weight stable. Renal has seen and recommended continue hemodynamic support with dobutamine for now. No indications for HD.   Creatinine trending down 4.8 -> 4.7 -> 4.5 -> 4.1  Objective:   Weight Range:  Vital Signs:   Temp:  [98 F (36.7 C)-98.9 F (37.2 C)] 98.9 F (37.2 C) (06/03 0755) Pulse Rate:  [85-97] 97 (06/03 0755) Resp:  [16-22] 16 (06/03 0755) BP: (100-120)/(65-79) 108/65 (06/03 0755) SpO2:  [94 %-100 %] 99 % (06/03 0755) Weight:  [50 kg] 50 kg (06/03 0500) Last BM Date: 11/28/18  Weight change: Filed Weights   11/27/18 0609 11/28/18 0518 11/29/18 0500  Weight: 50.1 kg 50.2 kg 50 kg    Intake/Output:   Intake/Output Summary (Last 24 hours) at 11/29/2018 4888 Last data filed at 11/29/2018 0800 Gross per 24 hour  Intake 840 ml  Output 1650 ml  Net -810 ml     Physical Exam: General:  Well appearing. No resp difficulty HEENT: normal Neck: supple. JVP 6-7. Carotids 2+ bilat; no bruits. No lymphadenopathy or thryomegaly appreciated. Cor: PMI nondisplaced. Regular rate & rhythm. No rubs, gallops or murmurs. Lungs: clear Abdomen: soft, nontender, nondistended. No hepatosplenomegaly. No bruits or masses. Good bowel sounds. Extremities: no cyanosis, clubbing, rash, edema Neuro: alert & orientedx3, cranial nerves grossly intact. moves all 4 extremities w/o difficulty. Affect pleasant   Telemetry: NSR 80-90s Personally reviewed   Labs: Basic Metabolic Panel: Recent Labs  Lab 11/25/18 0250 11/26/18 0406 11/27/18 0426 11/27/18 0936 11/27/18 0937 11/28/18 0444 11/29/18 0331  NA 140 139 137 144 141 138 135  K 4.7 4.1 3.8 3.8  3.9 3.9 3.8  CL 113* 112* 111  --   --  110 107  CO2 14* 15* 16*  --   --  16* 16*  GLUCOSE 85 103* 126*  --   --  142* 129*  BUN 76* 77* 67*  --   --  60* 56*  CREATININE 4.57* 4.86* 4.73*  --   --  4.53* 4.10*  CALCIUM 9.2 9.1 8.8*  --   --  9.0 8.7*  MG 1.4* 2.1  --   --   --   --   --     Liver Function Tests: No results for input(s): AST, ALT, ALKPHOS, BILITOT, PROT, ALBUMIN in the last 168 hours. No results for input(s): LIPASE, AMYLASE in the last 168 hours. No results for input(s): AMMONIA in the last 168 hours.  CBC: Recent Labs  Lab 11/24/18 1715 11/25/18 0250 11/27/18 0936 11/27/18 0937  WBC 7.1 7.2  --   --   HGB 10.1* 9.2* 8.5* 8.5*  HCT 32.4* 28.3* 25.0* 25.0*  MCV 86.6 82.3  --   --   PLT 249 207  --   --     Cardiac Enzymes: Recent Labs  Lab 11/24/18 1715  TROPONINI 0.03*    BNP: BNP (last 3 results) Recent Labs    08/06/18 0628 09/16/18 0342 11/24/18 1715  BNP 1,535.2* 1,254.7* 4,046.9*    ProBNP (last 3 results) No results for input(s):  PROBNP in the last 8760 hours.    Other results:  Imaging: No results found.   Medications:     Scheduled Medications: . aspirin  81 mg Oral Daily  . atorvastatin  40 mg Oral q1800  . enoxaparin (LOVENOX) injection  30 mg Subcutaneous Q24H  . feeding supplement (GLUCERNA SHAKE)  237 mL Oral TID BM  . insulin aspart  0-5 Units Subcutaneous QHS  . insulin aspart  0-9 Units Subcutaneous TID WC  . nystatin  5 mL Mouth/Throat QID  . pantoprazole  40 mg Oral Daily  . ticagrelor  90 mg Oral BID    Infusions: . DOBUTamine 2.5 mcg/kg/min (11/24/18 2323)    PRN Medications:     Assessment/plan:   1. AKI on CKD 4 - baseline creatinine 1.9-2.0 - creatinine 4.6 -> 4.86 -> 4.7 -> 4.5 -> 4.1  -  Renal u/s 5/29 Mildly hyperechoic kidneys, compatible with chronic medical renal Disease. - RHC 6/1 as above with moderately elevated filling pressures and moderately decreased output - Continue  dobutamine at 2.5 - Got one dose of po lasix 6/2 - Renal has seen and recommended continued hemodynamic support. May need home dobutamine.   2. Acute/Chronicsystolicheart failure - Suspect mixed ICM. NICM. S/p PCI 80% LAD on 12/15. -ECHO 08/06/18 EF 20-25% - Echo 11/25/18 EF 25-30% -StableNYHAIV symptoms currently. - RHC as above - Continue dobutamine at 2.5 for now. May need home inotropes - will need to decide this soon.  - Got one dose lasix yesterday. Hold other HF meds    3.CAD s/pNSTEMI  - 08/10/18 LHC with DES to 80% mLAD - Continueticagrelor + ASA.Denies bleeding - No s/s ischemia  4. Pulmonary Hypertension  - Mixed pHTN. PVR 4.7. Not candidate for selective pulmonary artery vasodilators at this point.  5. COPD -She reports tobacco cessation. No change.   6. DMII -Per PCP - Hgb A1C 10.2during recent admission - Continue SSI  7. Deconditioning - PT consult  8. UTI - UA dirty.  - Asymptomatic. Will treat with 3-day course of ceftriaxone.  - Check culture   Length of Stay: 5   Glori Bickers MD 11/29/2018, 8:22 AM  Advanced Heart Failure Team Pager [phone] (M-F; [place])  Please contact [facility] for night-coverage after hours (4p -7a ) and weekends on [email or web address]

## 2018-11-30 LAB — RENAL FUNCTION PANEL
Albumin: 3.2 g/dL — ABNORMAL LOW (ref 3.5–5.0)
Anion gap: 12 (ref 5–15)
BUN: 51 mg/dL — ABNORMAL HIGH (ref 8–23)
CO2: 17 mmol/L — ABNORMAL LOW (ref 22–32)
Calcium: 8.8 mg/dL — ABNORMAL LOW (ref 8.9–10.3)
Chloride: 108 mmol/L (ref 98–111)
Creatinine, Ser: 4 mg/dL — ABNORMAL HIGH (ref 0.44–1.00)
GFR calc Af Amer: 12 mL/min — ABNORMAL LOW (ref >60)
GFR calc non Af Amer: 11 mL/min — ABNORMAL LOW (ref >60)
Glucose, Bld: 108 mg/dL — ABNORMAL HIGH (ref 70–99)
Phosphorus: 3.7 mg/dL (ref 2.5–4.6)
Potassium: 3.5 mmol/L (ref 3.5–5.1)
Sodium: 137 mmol/L (ref 135–145)

## 2018-11-30 LAB — GLUCOSE, CAPILLARY
Glucose-Capillary: 90 mg/dL (ref 70–99)
Glucose-Capillary: 86 mg/dL (ref 70–99)
Glucose-Capillary: 173 mg/dL — ABNORMAL HIGH (ref 70–99)
Glucose-Capillary: 119 mg/dL — ABNORMAL HIGH (ref 70–99)
Glucose-Capillary: 130 mg/dL — ABNORMAL HIGH (ref 70–99)

## 2018-11-30 LAB — C-REACTIVE PROTEIN: CRP: <0.8 mg/dL (ref <1.0)

## 2018-11-30 MED ORDER — FUROSEMIDE 40 MG PO TABS
40.0000 mg | ORAL_TABLET | Freq: Every day | ORAL | Status: DC
  Administered 2018-11-30 – 2018-12-05 (×6): 40 mg via ORAL
  Filled 2018-11-30 (×6): qty 1

## 2018-11-30 MED ORDER — HEPARIN SODIUM (PORCINE) 5000 UNIT/ML IJ SOLN
5000.0000 [IU] | Freq: Three times a day (TID) | INTRAMUSCULAR | Status: DC
  Administered 2018-11-30 – 2018-12-05 (×14): 5000 [IU] via SUBCUTANEOUS
  Filled 2018-11-30 (×14): qty 1

## 2018-11-30 MED ORDER — POTASSIUM CHLORIDE CRYS ER 10 MEQ PO TBCR
10.0000 meq | EXTENDED_RELEASE_TABLET | Freq: Once | ORAL | Status: AC
  Administered 2018-11-30: 10 meq via ORAL
  Filled 2018-11-30: qty 1

## 2018-11-30 NOTE — Progress Notes (Signed)
Subjective:  1150 UOP- BUN and crt down again - CRP low- other serology labs pending  Objective Vital signs in last 24 hours: Vitals:   11/29/18 2342 11/30/18 0148 11/30/18 0355 11/30/18 0802  BP: 106/73  120/75 105/74  Pulse: 80 88 83   Resp: 15 20 13    Temp: 97.6 F (36.4 C)  98.3 F (36.8 C) 98.2 F (36.8 C)  TempSrc:   Oral Oral  SpO2: 99% 100% 100%   Weight:   50.3 kg   Height:       Weight change: 0.3 kg  Intake/Output Summary (Last 24 hours) at 11/30/2018 1029 Last data filed at 11/30/2018 0758 Gross per 24 hour  Intake 397.08 ml  Output 1450 ml  Net -1052.92 ml    Assessment/Plan: 68 year old BF with COPD, ischemic cardiomyopathy- EF 20- had mild CKD prior to now 3 hospitalizations for cardiac issues  1.Renal- pt with mild CKD prior to events - crt 1.1- 1.3- then high ones up to 2 approx 2 mos ago.  Now with volume overload  And worsening AKI- soft BP on ARB  improving the last 72 hours off ARB on dobutamine.  Renal ultrasound c/w medical renal disease.  U/A without protein but with RBC and WBC- sent for culture.  Suspect CKD of 2.0 on the basis of extensive hosp earlier this year.  Acute change over the last 2 mos in the setting of attempted diuresis and  ARB- again suspect hemodynamic issue but with active urine will consider other possibilities- send for culture and send serologies.  With normal CRP makes kidney specific dz less likely but await those results.    I would continue to hold the ARB.  No indications for dialysis- in fact renal function trending better, also not c/w acute renal disease.  I will have her follow up with me post hospitalization   2. Hypertension/volume  - volume status seems good right now, no diuretics.  I anticipate the next steps would be to wean dobutamine and likely will need resumption of diuretics at that time- will resume 40 daily of lasix- her home dose  3. Anemia  - has been dropping slowly since March - iron stores low, will replete and treat  with ESA as well- one dose on 6/2    Louis Meckel    Labs: Basic Metabolic Panel: Recent Labs  Lab 11/28/18 0444 11/29/18 0331 11/30/18 0255  NA 138 135 137  K 3.9 3.8 3.5  CL 110 107 108  CO2 16* 16* 17*  GLUCOSE 142* 129* 108*  BUN 60* 56* 51*  CREATININE 4.53* 4.10* 4.00*  CALCIUM 9.0 8.7* 8.8*  PHOS  --   --  3.7   Liver Function Tests: Recent Labs  Lab 11/30/18 0255  ALBUMIN 3.2*   No results for input(s): LIPASE, AMYLASE in the last 168 hours. No results for input(s): AMMONIA in the last 168 hours. CBC: Recent Labs  Lab 11/24/18 1715 11/25/18 0250 11/27/18 0936 11/27/18 0937  WBC 7.1 7.2  --   --   HGB 10.1* 9.2* 8.5* 8.5*  HCT 32.4* 28.3* 25.0* 25.0*  MCV 86.6 82.3  --   --   PLT 249 207  --   --    Cardiac Enzymes: Recent Labs  Lab 11/24/18 1715  TROPONINI 0.03*   CBG: Recent Labs  Lab 11/29/18 1133 11/29/18 1633 11/29/18 2146 11/30/18 0628 11/30/18 0747  GLUCAP 173* 115* 122* 90 86    Iron Studies:  Recent Labs    11/29/18 0331  IRON 28  TIBC 239*  FERRITIN 328*   Studies/Results: No results found. Medications: Infusions: . DOBUTamine 2.5 mcg/kg/min (11/29/18 2206)  . ferumoxytol 510 mg (11/29/18 1306)    Scheduled Medications: . aspirin  81 mg Oral Daily  . atorvastatin  40 mg Oral q1800  . feeding supplement (GLUCERNA SHAKE)  237 mL Oral TID BM  . heparin injection (subcutaneous)  5,000 Units Subcutaneous Q8H  . insulin aspart  0-5 Units Subcutaneous QHS  . insulin aspart  0-9 Units Subcutaneous TID WC  . nystatin  5 mL Mouth/Throat QID  . pantoprazole  40 mg Oral Daily  . ticagrelor  90 mg Oral BID    have reviewed scheduled and prn medications.  Physical Exam: General: NAD, looks good Heart: RRR Lungs: mostly clear Abdomen: soft, non tender Extremities: very minimal edema     11/30/2018,10:29 AM  LOS: 6 days

## 2018-11-30 NOTE — Progress Notes (Signed)
Notified by CCMD for a run of SVT with heart rate up to 185 lasting about 10 seconds. HR back to baseline NSR 80 - 90. In bed resting and asymptomatic. Will continue to monitor.

## 2018-11-30 NOTE — Progress Notes (Signed)
Advanced Heart Failure Rounding Note   Subjective:    RHC 6/1  RA = 7 RV = 11/7 PA = 55/18 (33) PCW = 25 (v = 35) Fick cardiac output/index = 4.0/2.7 PVR = 1.9 WU FA sat = 98% PA sat = 58%, 60%  Remains on dobutamine 2.5. Says she feels ok. No orthopnea, PND or edema.   Creatinine trending down 4.8 -> 4.7 -> 4.5 -> 4.1 -> 4.0   Objective:   Weight Range:  Vital Signs:   Temp:  [97.6 F (36.4 C)-98.3 F (36.8 C)] 97.9 F (36.6 C) (06/04 1209) Pulse Rate:  [80-94] 83 (06/04 0355) Resp:  [13-20] 13 (06/04 0355) BP: (98-120)/(59-81) 98/59 (06/04 1209) SpO2:  [99 %-100 %] 100 % (06/04 0355) Weight:  [50.3 kg] 50.3 kg (06/04 0355) Last BM Date: 11/28/18  Weight change: Filed Weights   11/28/18 0518 11/29/18 0500 11/30/18 0355  Weight: 50.2 kg 50 kg 50.3 kg    Intake/Output:   Intake/Output Summary (Last 24 hours) at 11/30/2018 1553 Last data filed at 11/30/2018 1109 Gross per 24 hour  Intake 360 ml  Output 1100 ml  Net -740 ml     Physical Exam: General:  Weak appearing. No resp difficulty HEENT: normal Neck: supple. no JVD. Carotids 2+ bilat; no bruits. No lymphadenopathy or thryomegaly appreciated. Cor: PMI nondisplaced. Regular rate & rhythm. No rubs, gallops or murmurs. Lungs: clear Abdomen: soft, nontender, nondistended. No hepatosplenomegaly. No bruits or masses. Good bowel sounds. Extremities: no cyanosis, clubbing, rash, edema Neuro: alert & orientedx3, cranial nerves grossly intact. moves all 4 extremities w/o difficulty. Affect pleasant   Telemetry: NSR 80-90s Personally reviewed   Labs: Basic Metabolic Panel: Recent Labs  Lab 11/25/18 0250 11/26/18 0406 11/27/18 0426 11/27/18 0936 11/27/18 0937 11/28/18 0444 11/29/18 0331 11/30/18 0255  NA 140 139 137 144 141 138 135 137  K 4.7 4.1 3.8 3.8 3.9 3.9 3.8 3.5  CL 113* 112* 111  --   --  110 107 108  CO2 14* 15* 16*  --   --  16* 16* 17*  GLUCOSE 85 103* 126*  --   --  142* 129* 108*   BUN 76* 77* 67*  --   --  60* 56* 51*  CREATININE 4.57* 4.86* 4.73*  --   --  4.53* 4.10* 4.00*  CALCIUM 9.2 9.1 8.8*  --   --  9.0 8.7* 8.8*  MG 1.4* 2.1  --   --   --   --   --   --   PHOS  --   --   --   --   --   --   --  3.7    Liver Function Tests: Recent Labs  Lab 11/30/18 0255  ALBUMIN 3.2*   No results for input(s): LIPASE, AMYLASE in the last 168 hours. No results for input(s): AMMONIA in the last 168 hours.  CBC: Recent Labs  Lab 11/24/18 1715 11/25/18 0250 11/27/18 0936 11/27/18 0937  WBC 7.1 7.2  --   --   HGB 10.1* 9.2* 8.5* 8.5*  HCT 32.4* 28.3* 25.0* 25.0*  MCV 86.6 82.3  --   --   PLT 249 207  --   --     Cardiac Enzymes: Recent Labs  Lab 11/24/18 1715  TROPONINI 0.03*    BNP: BNP (last 3 results) Recent Labs    08/06/18 0628 09/16/18 0342 11/24/18 1715  BNP 1,535.2* 1,254.7* 4,046.9*    ProBNP (last  3 results) No results for input(s): PROBNP in the last 8760 hours.    Other results:  Imaging: No results found.   Medications:     Scheduled Medications: . aspirin  81 mg Oral Daily  . atorvastatin  40 mg Oral q1800  . feeding supplement (GLUCERNA SHAKE)  237 mL Oral TID BM  . furosemide  40 mg Oral Daily  . heparin injection (subcutaneous)  5,000 Units Subcutaneous Q8H  . insulin aspart  0-5 Units Subcutaneous QHS  . insulin aspart  0-9 Units Subcutaneous TID WC  . nystatin  5 mL Mouth/Throat QID  . pantoprazole  40 mg Oral Daily  . ticagrelor  90 mg Oral BID    Infusions: . DOBUTamine 2.5 mcg/kg/min (11/29/18 2206)  . ferumoxytol 510 mg (11/29/18 1306)    PRN Medications:     Assessment/plan:   1. AKI on CKD 4 - baseline creatinine 1.9-2.0 - creatinine 4.6 -> 4.86 -> 4.7 -> 4.5 -> 4.1 -> 4.0 -  Renal u/s 5/29 Mildly hyperechoic kidneys, compatible with chronic medical renal Disease. - RHC 6/1 as above with moderately elevated filling pressures and moderately decreased output - Continue dobutamine at 2.5 -  Got one dose of po lasix 6/2 - Renal has seen and recommended continued hemodynamic support. - I had long talk with her about the options and possible need for home inotropes. We will plan to keep her in the hospital on dobutamine and see how her good her renal function will get. She may need home inotropes. We will follow.  - lasix restarted by Renal (agree) -  She only takes prn at home usually so watch closely for overdiuresis.   2. Acute/Chronicsystolicheart failure - Suspect mixed ICM. NICM. S/p PCI 80% LAD on 12/15. -ECHO 08/06/18 EF 20-25% - Echo 11/25/18 EF 25-30% -StableNYHAIV symptoms currently. - RHC as above - Continue dobutamine at 2.5 for now. May need home inotropes - will need to decide this soon. See above - Hold other HF meds  Due to low BP and AKI   3.CAD s/pNSTEMI  - 08/10/18 LHC with DES to 80% mLAD - Continueticagrelor + ASA.Denies bleeding - No s/s ischemia  4. Pulmonary Hypertension  - Mixed pHTN. PVR 4.7. Not candidate for selective pulmonary artery vasodilators at this point.  5. COPD -She reports tobacco cessation. No change.   6. DMII -Per PCP - Hgb A1C 10.2during recent admission - Continue SSI  7. Deconditioning - PT consult  8. UTI - UA dirty.  - Asymptomatic. Will treat with 3-day course of ceftriaxone.  - Culture 100K e.coli. (sensitivities pending)  Length of Stay: 6   Glori Bickers MD 11/30/2018, 3:53 PM  Advanced Heart Failure Team Pager [phone] (M-F; 7a - 4p)  Please contact [facility] for night-coverage after hours (4p -7a ) and weekends on [email or web address]

## 2018-11-30 NOTE — Progress Notes (Signed)
Physical Therapy Treatment Patient Details Name: Samaria Anes MRN: [id number] DOB: 05/16/1951 Today's Date: 11/30/2018    History of Present Illness Pt adm with acute hypoxic respiratory failure due to acute kidney injury. PMH - heart failure, copd, cad, ckd, dm, NSTEMI    PT Comments    Pt making good progress with mobility.    Follow Up Recommendations  Home health PT     Equipment Recommendations  None recommended by PT    Recommendations for Other Services       Precautions / Restrictions Precautions Precautions: Fall Restrictions Weight Bearing Restrictions: No    Mobility  Bed Mobility Overal bed mobility: Modified Independent                Transfers Overall transfer level: Needs assistance Equipment used: 4-wheeled walker Transfers: Sit to/from Stand Sit to Stand: Supervision         General transfer comment: assist for safety and lines  Ambulation/Gait Ambulation/Gait assistance: Supervision Gait Distance (Feet): 225 Feet Assistive device: 4-wheeled walker Gait Pattern/deviations: Step-through pattern;Decreased stride length Gait velocity: decreased  Gait velocity interpretation: 1.31 - 2.62 ft/sec, indicative of limited community ambulator General Gait Details: supervision for lines and safety. HR and SpO2 stable   Stairs             Wheelchair Mobility    Modified Rankin (Stroke Patients Only)       Balance Overall balance assessment: Needs assistance Sitting-balance support: No upper extremity supported;Feet supported Sitting balance-Leahy Scale: Good     Standing balance support: During functional activity;No upper extremity supported Standing balance-Leahy Scale: Fair                              Cognition Arousal/Alertness: Awake/alert Behavior During Therapy: WFL for tasks assessed/performed Overall Cognitive Status: Within Functional Limits for tasks assessed                                         Exercises      General Comments        Pertinent Vitals/Pain Pain Assessment: No/denies pain    Home Living                      Prior Function            PT Goals (current goals can now be found in the care plan section) Progress towards PT goals: Progressing toward goals    Frequency    Min 2X/week      PT Plan Current plan remains appropriate    Co-evaluation              AM-PAC PT "6 Clicks" Mobility   Outcome Measure  Help needed turning from your back to your side while in a flat bed without using bedrails?: None Help needed moving from lying on your back to sitting on the side of a flat bed without using bedrails?: None Help needed moving to and from a bed to a chair (including a wheelchair)?: A Little Help needed standing up from a chair using your arms (e.g., wheelchair or bedside chair)?: A Little Help needed to walk in hospital room?: A Little Help needed climbing 3-5 steps with a railing? : A Little 6 Click Score: 20    End of Session Equipment Utilized During Treatment: Gait belt Activity  Tolerance: Patient tolerated treatment well Patient left: with call bell/phone within reach;in bed   PT Visit Diagnosis: Unsteadiness on feet (R26.81);Other abnormalities of gait and mobility (R26.89);Muscle weakness (generalized) (M62.81)     Time: 7034-0352 PT Time Calculation (min) (ACUTE ONLY): 13 min  Charges:  $Gait Training: 8-22 mins                     [place] Pager [phone] Office [place] 11/30/2018, 3:38 PM

## 2018-11-30 NOTE — Progress Notes (Signed)
PROGRESS NOTE    [name]   WEX:[id number]  DOB: [birth year]  DOA: 11/24/2018 PCP: [name], MD   Brief Narrative:  [name] is a 68 y.o. female with medical history significant of DM2, ichronic systolic CHF with a EF of 20%, CAD with stent to prox LAD in 2/20, CKD 3 with baseline creatinine of 2, COPD/ chronic respiratory failure on 1 to 2 L of nasal cannula only at night.  She presents with dyspnea that has not improved with oral Lasix increased from 40 to 80 mg daily 2 days prior to admission by cardiology. She was told to come into the ED for AKI in setting of fluid overload.  CXR> central venous congestion, effusions only slightly improved from prior Renal US> Mildly hyperechoic kidneys, compatible with chronic medical renal disease  SBP in low 100s Pulse ox 100% on 3 L Cr 4.38 with baseline being ~ 2 LA> 2.0 BNP 4,046 Trop > 0.03  Subjective: She had no complaints today.  Continues to report no complaints of burning urination, increase or decrease urinary frequency or accidents with urination.  Also denies any significant change in character of urine.  No fever no chills reported at home or in the hospital.  Assessment & Plan: AKI (acute kidney injury) on CKD 3  metabolic acidosis Acute on Chronic systolic CHF (congestive heart failure) Pulmonary HTN   - AKI due to low cardiac output in setting of Lasix, Aldactone and Losartan use- all 3 being held - renal ultrasound consistent with medical renal disease - heart failure team assisting with management of CHF and Dobutamine has been started to increase cardiac output - s/p right heart cath showing moderately elevated pulmonary pressure-  - cont dobutamine Nephrology following as well.  Appreciate their assistance as well.  Active Problems: Hypotension/ Lactic acidosis - Coreg being held - Lactic acidosis resolved  Elevated troponin - mild elevation at 0.03 related to above AKI and heart failure-   Short 5  beat run of V tach - on 5/30- Coreg on hold currently by cardiology    Diabetes mellitus 2   - last A1c 9.3 on 3/22- poor control with nephropathy - she states she no longer takes long acting insulin- will follow sugars int he hospital and see if we need to initiate this -  SSI ordered- sugars are well controlled - holding Glucophage  Thrush - possibly related to uncontrolled DM - started Nystatin- improving  GERD - has heart burn frequently- treat thrush which may be extending into esophagus with Nystatin swish and swallow - cont Protonix daily    CAD (coronary artery disease), native coronary artery - cont ASA and Brillinta - cont Atorvastatin    COPD (chronic obstructive pulmonary disease)  - no exacerbation noted  Asymptomatic bacteriuria.  With E. coli Patient was started on IV ceftriaxone by cardiology but the patient does not have any symptoms. We will discontinue antibiotics.   Time spent in minutes: 35 DVT prophylaxis: SCDs Code Status: Full code Family Communication:  Disposition Plan: f/u Cr- PT recommends HHPT  Consultants:   Heart failure team Procedures:   2 D ECHO 1. The left ventricle has severely reduced systolic function, with an ejection fraction of 25-30%. The cavity size was normal. Indeterminate diastolic filling due to E-A fusion.  2. LVEF is severely depressed at approximately 25% with hypokinesi of the mid LV; akinesis of the distal 1/3 of LV No significant change in LVEF from echo report from Feb 2020.  3.  The right ventricle has normal systolic function. The cavity was normal. There is no increase in right ventricular wall thickness.  4. Left atrial size was mildly dilated.  5. Trivial pericardial effusion is present.  6. The mitral valve is abnormal. Mild thickening of the mitral valve leaflet.  7. The aortic valve is tricuspid. Mild thickening of the aortic valve. Mild calcification of the aortic valve.  8. The interatrial septum was not  assessed.  Antimicrobials:  Anti-infectives (From admission, onward)   Start     Dose/Rate Route Frequency Ordered Stop   11/29/18 1500  cefTRIAXone (ROCEPHIN) 1 g in sodium chloride 0.9 % 100 mL IVPB  Status:  Discontinued    Note to Pharmacy:  Pharmacy to dose.   1 g 200 mL/hr over 30 Minutes Intravenous Every 24 hours 11/29/18 1416 11/29/18 1519       Objective: Vitals:   11/30/18 0355 11/30/18 0802 11/30/18 1209 11/30/18 1657  BP: 120/75 105/74 (!) 98/59 110/72  Pulse: 83     Resp: 13     Temp: 98.3 F (36.8 C) 98.2 F (36.8 C) 97.9 F (36.6 C) 98.8 F (37.1 C)  TempSrc: Oral Oral Oral Oral  SpO2: 100%     Weight: 50.3 kg     Height:        Intake/Output Summary (Last 24 hours) at 11/30/2018 2010 Last data filed at 11/30/2018 1831 Gross per 24 hour  Intake 360 ml  Output 600 ml  Net -240 ml   Filed Weights   11/28/18 0518 11/29/18 0500 11/30/18 0355  Weight: 50.2 kg 50 kg 50.3 kg    Examination: General exam: Appears comfortable  HEENT: PERRLA, oral mucosa moist, no sclera icterus or thrush Respiratory system: Clear to auscultation. Respiratory effort normal. Cardiovascular system: S1 & S2 heard,  No murmurs  Gastrointestinal system: Abdomen soft, non-tender, nondistended. Normal bowel sounds   Central nervous system: Alert and oriented. No focal neurological deficits. Extremities: No cyanosis, clubbing or edema Skin: No rashes or ulcers Psychiatry:  Mood & affect appropriate.   Data Reviewed: I have personally reviewed following labs and imaging studies  CBC: Recent Labs  Lab 11/24/18 1715 11/25/18 0250 11/27/18 0936 11/27/18 0937  WBC 7.1 7.2  --   --   HGB 10.1* 9.2* 8.5* 8.5*  HCT 32.4* 28.3* 25.0* 25.0*  MCV 86.6 82.3  --   --   PLT 249 207  --   --    Basic Metabolic Panel: Recent Labs  Lab 11/25/18 0250 11/26/18 0406 11/27/18 0426 11/27/18 0936 11/27/18 0937 11/28/18 0444 11/29/18 0331 11/30/18 0255  NA 140 139 137 144 141 138 135  137  K 4.7 4.1 3.8 3.8 3.9 3.9 3.8 3.5  CL 113* 112* 111  --   --  110 107 108  CO2 14* 15* 16*  --   --  16* 16* 17*  GLUCOSE 85 103* 126*  --   --  142* 129* 108*  BUN 76* 77* 67*  --   --  60* 56* 51*  CREATININE 4.57* 4.86* 4.73*  --   --  4.53* 4.10* 4.00*  CALCIUM 9.2 9.1 8.8*  --   --  9.0 8.7* 8.8*  MG 1.4* 2.1  --   --   --   --   --   --   PHOS  --   --   --   --   --   --   --  3.7   GFR:  Estimated Creatinine Clearance: 10.4 mL/min (A) (by C-G formula based on SCr of 4 mg/dL (H)). Liver Function Tests: Recent Labs  Lab 11/30/18 0255  ALBUMIN 3.2*   No results for input(s): LIPASE, AMYLASE in the last 168 hours. No results for input(s): AMMONIA in the last 168 hours. Coagulation Profile: No results for input(s): INR, PROTIME in the last 168 hours. Cardiac Enzymes: Recent Labs  Lab 11/24/18 1715  TROPONINI 0.03*   BNP (last 3 results) No results for input(s): PROBNP in the last 8760 hours. HbA1C: No results for input(s): HGBA1C in the last 72 hours. CBG: Recent Labs  Lab 11/29/18 2146 11/30/18 0628 11/30/18 0747 11/30/18 1138 11/30/18 1655  GLUCAP 122* 90 86 173* 119*   Lipid Profile: No results for input(s): CHOL, HDL, LDLCALC, TRIG, CHOLHDL, LDLDIRECT in the last 72 hours. Thyroid Function Tests: No results for input(s): TSH, T4TOTAL, FREET4, T3FREE, THYROIDAB in the last 72 hours. Anemia Panel: Recent Labs    11/29/18 0331  FERRITIN 328*  TIBC 239*  IRON 28   Urine analysis:    Component Value Date/Time   COLORURINE YELLOW 11/28/2018 2135   APPEARANCEUR HAZY (A) 11/28/2018 2135   LABSPEC 1.008 11/28/2018 2135   PHURINE 5.0 11/28/2018 2135   GLUCOSEU NEGATIVE 11/28/2018 2135   HGBUR MODERATE (A) 11/28/2018 2135   BILIRUBINUR NEGATIVE 11/28/2018 2135   KETONESUR NEGATIVE 11/28/2018 2135   PROTEINUR NEGATIVE 11/28/2018 2135   NITRITE NEGATIVE 11/28/2018 2135   LEUKOCYTESUR LARGE (A) 11/28/2018 2135   Sepsis Labs:  @[email or web address] (procalcitonin:4,lacticidven:4) ) Recent Results (from the past 240 hour(s))  SARS Coronavirus 2 (CEPHEID - Performed in [place] hospital lab), Hosp Order     Status: None   Collection Time: 11/24/18  8:27 PM  Result Value Ref Range Status   SARS Coronavirus 2 NEGATIVE NEGATIVE Final    Comment: (NOTE) If result is NEGATIVE SARS-CoV-2 target nucleic acids are NOT DETECTED. The SARS-CoV-2 RNA is generally detectable in upper and lower  respiratory specimens during the acute phase of infection. The lowest  concentration of SARS-CoV-2 viral copies this assay can detect is 250  copies / mL. A negative result does not preclude SARS-CoV-2 infection  and should not be used as the sole basis for treatment or other  patient management decisions.  A negative result may occur with  improper specimen collection / handling, submission of specimen other  than nasopharyngeal swab, presence of viral mutation(s) within the  areas targeted by this assay, and inadequate number of viral copies  (<250 copies / mL). A negative result must be combined with clinical  observations, patient history, and epidemiological information. If result is POSITIVE SARS-CoV-2 target nucleic acids are DETECTED. The SARS-CoV-2 RNA is generally detectable in upper and lower  respiratory specimens dur ing the acute phase of infection.  Positive  results are indicative of active infection with SARS-CoV-2.  Clinical  correlation with patient history and other diagnostic information is  necessary to determine patient infection status.  Positive results do  not rule out bacterial infection or co-infection with other viruses. If result is PRESUMPTIVE POSTIVE SARS-CoV-2 nucleic acids MAY BE PRESENT.   A presumptive positive result was obtained on the submitted specimen  and confirmed on repeat testing.  While 2019 novel coronavirus  (SARS-CoV-2) nucleic acids may be present in the submitted sample  additional  confirmatory testing may be necessary for epidemiological  and / or clinical management purposes  to differentiate between  SARS-CoV-2 and other Sarbecovirus currently known to  infect humans.  If clinically indicated additional testing with an alternate test  methodology [phone]) is advised. The SARS-CoV-2 RNA is generally  detectable in upper and lower respiratory sp ecimens during the acute  phase of infection. The expected result is Negative. Fact Sheet for Patients:  StrictlyIdeas.no Fact Sheet for Healthcare Providers: BankingDealers.co.za This test is not yet approved or cleared by the [place] FDA and has been authorized for detection and/or diagnosis of SARS-CoV-2 by FDA under an Emergency Use Authorization (EUA).  This EUA will remain in effect (meaning this test can be used) for the duration of the COVID-19 declaration under Section 564(b)(1) of the Act, 21 U.S.C. section 360bbb-3(b)(1), unless the authorization is terminated or revoked sooner. Performed at [facility], [place] [address] [place], [place] [address]   Culture, Urine     Status: Abnormal (Preliminary result)   Collection Time: 11/29/18  2:55 PM  Result Value Ref Range Status   Specimen Description URINE, CLEAN CATCH  Final   Special Requests   Final    NONE Performed at [facility], [place] [address], [place], [place] [address]    Culture >=100,000 COLONIES/mL ESCHERICHIA COLI (A)  Final   Report Status PENDING  Incomplete         Radiology Studies: No results found.    Scheduled Meds: . aspirin  81 mg Oral Daily  . atorvastatin  40 mg Oral q1800  . feeding supplement (GLUCERNA SHAKE)  237 mL Oral TID BM  . furosemide  40 mg Oral Daily  . heparin injection (subcutaneous)  5,000 Units Subcutaneous Q8H  . insulin aspart  0-5 Units Subcutaneous QHS  . insulin aspart  0-9 Units Subcutaneous TID WC  . nystatin  5 mL Mouth/Throat QID  .  pantoprazole  40 mg Oral Daily  . ticagrelor  90 mg Oral BID   Continuous Infusions: . DOBUTamine 2.5 mcg/kg/min (11/29/18 2206)  . ferumoxytol 510 mg (11/29/18 1306)     LOS: 6 days      Berle Mull, MD [facility] Pager: [email or web address] Password [email or web address] 11/30/2018, 8:10 PM

## 2018-12-01 ENCOUNTER — Encounter (HOSPITAL_COMMUNITY): Payer: Self-pay | Admitting: General Practice

## 2018-12-01 LAB — URINE CULTURE: Culture: >=100000 — AB

## 2018-12-01 LAB — ANTINUCLEAR ANTIBODIES, IFA: ANA Ab, IFA: NEGATIVE

## 2018-12-01 LAB — GLUCOSE, CAPILLARY
Glucose-Capillary: 94 mg/dL (ref 70–99)
Glucose-Capillary: 206 mg/dL — ABNORMAL HIGH (ref 70–99)
Glucose-Capillary: 107 mg/dL — ABNORMAL HIGH (ref 70–99)
Glucose-Capillary: 192 mg/dL — ABNORMAL HIGH (ref 70–99)

## 2018-12-01 LAB — RENAL FUNCTION PANEL
Albumin: 3.3 g/dL — ABNORMAL LOW (ref 3.5–5.0)
Anion gap: 14 (ref 5–15)
BUN: 49 mg/dL — ABNORMAL HIGH (ref 8–23)
CO2: 15 mmol/L — ABNORMAL LOW (ref 22–32)
Calcium: 9 mg/dL (ref 8.9–10.3)
Chloride: 109 mmol/L (ref 98–111)
Creatinine, Ser: 3.73 mg/dL — ABNORMAL HIGH (ref 0.44–1.00)
GFR calc Af Amer: 13 mL/min — ABNORMAL LOW (ref >60)
GFR calc non Af Amer: 12 mL/min — ABNORMAL LOW (ref >60)
Glucose, Bld: 126 mg/dL — ABNORMAL HIGH (ref 70–99)
Phosphorus: 3.3 mg/dL (ref 2.5–4.6)
Potassium: 3.6 mmol/L (ref 3.5–5.1)
Sodium: 138 mmol/L (ref 135–145)

## 2018-12-01 LAB — GLOMERULAR BASEMENT MEMBRANE ANTIBODIES: GBM Ab: 5 units (ref 0–20)

## 2018-12-01 MED ORDER — SODIUM CHLORIDE 0.9 % IV SOLN
1.0000 g | INTRAVENOUS | Status: DC
  Administered 2018-12-01: 1 g via INTRAVENOUS
  Filled 2018-12-01 (×2): qty 10

## 2018-12-01 MED ORDER — CEFTRIAXONE SODIUM 1 G IJ SOLR: 1.0000 g | INTRAMUSCULAR | Status: DC

## 2018-12-01 MED ORDER — SENNOSIDES-DOCUSATE SODIUM 8.6-50 MG PO TABS
1.0000 | ORAL_TABLET | Freq: Two times a day (BID) | ORAL | Status: DC
  Administered 2018-12-01 – 2018-12-05 (×9): 1 via ORAL
  Filled 2018-12-01 (×9): qty 1

## 2018-12-01 MED ORDER — POLYETHYLENE GLYCOL 3350 17 G PO PACK
17.0000 g | PACK | Freq: Once | ORAL | Status: AC
  Administered 2018-12-01: 17 g via ORAL
  Filled 2018-12-01: qty 1

## 2018-12-01 NOTE — Progress Notes (Signed)
Advanced Heart Failure Rounding Note   Subjective:    RHC 6/1  RA = 7 RV = 11/7 PA = 55/18 (33) PCW = 25 (v = 35) Fick cardiac output/index = 4.0/2.7 PVR = 1.9 WU FA sat = 98% PA sat = 58%, 60%  Remains on dobutamine 2.5. Says she feels ok. No SOB, orthopnea or PND. Now on po diuretics. Weight stable   Creatinine trending down 4.8 -> 4.7 -> 4.5 -> 4.1 -> 4.0 -> 3.7  Objective:   Weight Range:  Vital Signs:   Temp:  [97.7 F (36.5 C)-98.8 F (37.1 C)] 98.5 F (36.9 C) (06/05 1122) Pulse Rate:  [84-97] 85 (06/05 1122) Resp:  [13-21] 19 (06/05 1122) BP: (98-118)/(60-74) 102/66 (06/05 1122) SpO2:  [97 %-100 %] 100 % (06/05 1122) Weight:  [50.7 kg] 50.7 kg (06/05 0300) Last BM Date: 11/28/18  Weight change: Filed Weights   11/29/18 0500 11/30/18 0355 12/01/18 0300  Weight: 50 kg 50.3 kg 50.7 kg    Intake/Output:   Intake/Output Summary (Last 24 hours) at 12/01/2018 1306 Last data filed at 12/01/2018 0920 Gross per 24 hour  Intake 620.31 ml  Output 400 ml  Net 220.31 ml     Physical Exam: General:  Weak appearing. No resp difficulty HEENT: normal Neck: supple. JVP 7 Carotids 2+ bilat; no bruits. No lymphadenopathy or thryomegaly appreciated. Cor: PMI nondisplaced. Regular rate & rhythm. No rubs, gallops or murmurs. Lungs: clear Abdomen: soft, nontender, nondistended. No hepatosplenomegaly. No bruits or masses. Good bowel sounds. Extremities: no cyanosis, clubbing, rash, edema Neuro: alert & orientedx3, cranial nerves grossly intact. moves all 4 extremities w/o difficulty. Affect pleasant   Telemetry: NSR 80-90s Personally reviewed   Labs: Basic Metabolic Panel: Recent Labs  Lab 11/25/18 0250 11/26/18 0406 11/27/18 0426  11/27/18 4259 11/28/18 0444 11/29/18 0331 11/30/18 0255 12/01/18 0306  NA 140 139 137   < > 141 138 135 137 138  K 4.7 4.1 3.8   < > 3.9 3.9 3.8 3.5 3.6  CL 113* 112* 111  --   --  110 107 108 109  CO2 14* 15* 16*  --   --   16* 16* 17* 15*  GLUCOSE 85 103* 126*  --   --  142* 129* 108* 126*  BUN 76* 77* 67*  --   --  60* 56* 51* 49*  CREATININE 4.57* 4.86* 4.73*  --   --  4.53* 4.10* 4.00* 3.73*  CALCIUM 9.2 9.1 8.8*  --   --  9.0 8.7* 8.8* 9.0  MG 1.4* 2.1  --   --   --   --   --   --   --   PHOS  --   --   --   --   --   --   --  3.7 3.3   < > = values in this interval not displayed.    Liver Function Tests: Recent Labs  Lab 11/30/18 0255 12/01/18 0306  ALBUMIN 3.2* 3.3*   No results for input(s): LIPASE, AMYLASE in the last 168 hours. No results for input(s): AMMONIA in the last 168 hours.  CBC: Recent Labs  Lab 11/24/18 1715 11/25/18 0250 11/27/18 0936 11/27/18 0937  WBC 7.1 7.2  --   --   HGB 10.1* 9.2* 8.5* 8.5*  HCT 32.4* 28.3* 25.0* 25.0*  MCV 86.6 82.3  --   --   PLT 249 207  --   --  Cardiac Enzymes: Recent Labs  Lab 11/24/18 1715  TROPONINI 0.03*    BNP: BNP (last 3 results) Recent Labs    08/06/18 0628 09/16/18 0342 11/24/18 1715  BNP 1,535.2* 1,254.7* 4,046.9*    ProBNP (last 3 results) No results for input(s): PROBNP in the last 8760 hours.    Other results:  Imaging: No results found.   Medications:     Scheduled Medications: . aspirin  81 mg Oral Daily  . atorvastatin  40 mg Oral q1800  . feeding supplement (GLUCERNA SHAKE)  237 mL Oral TID BM  . furosemide  40 mg Oral Daily  . heparin injection (subcutaneous)  5,000 Units Subcutaneous Q8H  . insulin aspart  0-5 Units Subcutaneous QHS  . insulin aspart  0-9 Units Subcutaneous TID WC  . nystatin  5 mL Mouth/Throat QID  . pantoprazole  40 mg Oral Daily  . senna-docusate  1 tablet Oral BID  . ticagrelor  90 mg Oral BID    Infusions: . cefTRIAXone (ROCEPHIN)  IV    . DOBUTamine 2.5 mcg/kg/min (12/01/18 0400)  . ferumoxytol 510 mg (11/29/18 1306)    PRN Medications:     Assessment/plan:   1. AKI on CKD 4 - baseline creatinine 1.9-2.0 - creatinine 4.6 -> 4.86 -> 4.7 -> 4.5 -> 4.1  -> 4.0 -> 3.6 -  Renal u/s 5/29 Mildly hyperechoic kidneys, compatible with chronic medical renal Disease. - RHC 6/1 as above with moderately elevated filling pressures and moderately decreased output - Continue dobutamine at 2.5 - Got one dose of po lasix 6/2 - Renal has seen and recommended continued hemodynamic support. - I had long talk with her about the options and possible need for home inotropes. We will plan to keep her in the hospital on dobutamine over the weekend and see how her good her renal function will get. She may need home inotropes. We will follow.  - lasix restarted by Renal (agree) -  She only takes prn at home usually so watch closely for overdiuresis. Volume status looks ok today   2. Acute/Chronicsystolicheart failure - Suspect mixed ICM. NICM. S/p PCI 80% LAD on 12/15. -ECHO 08/06/18 EF 20-25% - Echo 11/25/18 EF 25-30% -StableNYHAIV symptoms currently. - RHC as above - Continue dobutamine at 2.5 for now. May need home inotropes - will need to decide this soon. See above - Hold other HF meds  Due to low BP and AKI   3.CAD s/pNSTEMI  - 08/10/18 LHC with DES to 80% mLAD - Continueticagrelor + ASA.Denies bleeding - No s/s ischemia  4. Pulmonary Hypertension  - Mixed pHTN. PVR 4.7. Not candidate for selective pulmonary artery vasodilators at this point.  5. COPD -She reports tobacco cessation. No change.   6. DMII -Per PCP - Hgb A1C 10.2during recent admission - Continue SSI  7. Deconditioning - PT consult  8. UTI - UA dirty.  - Asymptomatic.  - Culture 100K e.coli.  - Rec'd 1 day of ceftriaxone but stopped by primary team (felt to be colonization)  Length of Stay: 7   Glori Bickers MD 12/01/2018, 1:06 PM  Advanced Heart Failure Team Pager [phone] (M-F; [place])  Please contact [facility] for night-coverage after hours (4p -7a ) and weekends on [email or web address]

## 2018-12-01 NOTE — Progress Notes (Signed)
Subjective:  1000 UOP- BUN and crt down again - CRP low- other serology labs pending  Objective Vital signs in last 24 hours: Vitals:   12/01/18 0252 12/01/18 0300 12/01/18 0821 12/01/18 1122  BP: 111/74  112/72 102/66  Pulse: 95  84 85  Resp: 20  19 19   Temp: 97.7 F (36.5 C)  98.6 F (37 C) 98.5 F (36.9 C)  TempSrc: Oral  Oral Oral  SpO2: 100%  97% 100%  Weight:  50.7 kg    Height:       Weight change: 0.367 kg  Intake/Output Summary (Last 24 hours) at 12/01/2018 1220 Last data filed at 12/01/2018 0920 Gross per 24 hour  Intake 620.31 ml  Output 400 ml  Net 220.31 ml    Assessment/Plan: 68 year old BF with COPD, ischemic cardiomyopathy- EF 20- had mild CKD prior to now 3 hospitalizations for cardiac issues  1.Renal- pt with mild CKD prior to events - crt 1.1- 1.3- then high ones up to 2 approx 2 mos ago.  Now with volume overload and worsening AKI- soft BP on ARB  improving the last few days off ARB on dobutamine.  Renal ultrasound c/w medical renal disease.  U/A without protein but with RBC and WBC- sent for culture.  Suspect CKD of 2.0 on the basis of extensive hosp earlier this year.  Acute change over the last 2 mos in the setting of attempted diuresis and  ARB- again suspect hemodynamic issue but with active urine will consider other possibilities- send for culture- ecoli- r to cipro- will treat.   serologies (pending).  With normal CRP makes kidney specific dz less likely but await those results.    I would continue to hold the ARB.  No indications for dialysis- in fact renal function trending better, also not c/w acute GN.  I will have her eventually follow up with me post hospitalization   2. Hypertension/volume  - volume status seems good right now, no diuretics.  I anticipate the next steps would be to wean dobutamine and likely will need resumption of diuretics at that time- have  resumed 40 daily of lasix- her home dose.  Now talking home inotropes  3. Anemia  - has been  dropping slowly since March - iron stores low, replete and treat with ESA as well- one dose on 6/2    Louis Meckel    Labs: Basic Metabolic Panel: Recent Labs  Lab 11/29/18 0331 11/30/18 0255 12/01/18 0306  NA 135 137 138  K 3.8 3.5 3.6  CL 107 108 109  CO2 16* 17* 15*  GLUCOSE 129* 108* 126*  BUN 56* 51* 49*  CREATININE 4.10* 4.00* 3.73*  CALCIUM 8.7* 8.8* 9.0  PHOS  --  3.7 3.3   Liver Function Tests: Recent Labs  Lab 11/30/18 0255 12/01/18 0306  ALBUMIN 3.2* 3.3*   No results for input(s): LIPASE, AMYLASE in the last 168 hours. No results for input(s): AMMONIA in the last 168 hours. CBC: Recent Labs  Lab 11/24/18 1715 11/25/18 0250 11/27/18 0936 11/27/18 0937  WBC 7.1 7.2  --   --   HGB 10.1* 9.2* 8.5* 8.5*  HCT 32.4* 28.3* 25.0* 25.0*  MCV 86.6 82.3  --   --   PLT 249 207  --   --    Cardiac Enzymes: Recent Labs  Lab 11/24/18 1715  TROPONINI 0.03*   CBG: Recent Labs  Lab 11/30/18 1138 11/30/18 1655 11/30/18 2207 12/01/18 2094 12/01/18 1121  GLUCAP 173* 119* 130* 94 206*    Iron Studies:  Recent Labs    11/29/18 0331  IRON 28  TIBC 239*  FERRITIN 328*   Studies/Results: No results found. Medications: Infusions: . DOBUTamine 2.5 mcg/kg/min (12/01/18 0400)  . ferumoxytol 510 mg (11/29/18 1306)    Scheduled Medications: . aspirin  81 mg Oral Daily  . atorvastatin  40 mg Oral q1800  . feeding supplement (GLUCERNA SHAKE)  237 mL Oral TID BM  . furosemide  40 mg Oral Daily  . heparin injection (subcutaneous)  5,000 Units Subcutaneous Q8H  . insulin aspart  0-5 Units Subcutaneous QHS  . insulin aspart  0-9 Units Subcutaneous TID WC  . nystatin  5 mL Mouth/Throat QID  . pantoprazole  40 mg Oral Daily  . senna-docusate  1 tablet Oral BID  . ticagrelor  90 mg Oral BID    have reviewed scheduled and prn medications.  Physical Exam: General: NAD, looks good Heart: RRR Lungs: mostly clear Abdomen: soft, non  tender Extremities: very minimal edema     12/01/2018,12:20 PM  LOS: 7 days

## 2018-12-01 NOTE — Progress Notes (Signed)
PROGRESS NOTE    [name]   OQH:[id number]  DOB: Dec 12, 1950  DOA: 11/24/2018 PCP: [name], MD   Brief Narrative:  [name] is a 68 y.o. female with medical history significant of DM2, ichronic systolic CHF with a EF of 20%, CAD with stent to prox LAD in 2/20, CKD 3 with baseline creatinine of 2, COPD/ chronic respiratory failure on 1 to 2 L of nasal cannula only at night.  She presents with dyspnea that has not improved with oral Lasix increased from 40 to 80 mg daily 2 days prior to admission by cardiology. She was told to come into the ED for AKI in setting of fluid overload.  CXR> central venous congestion, effusions only slightly improved from prior Renal US> Mildly hyperechoic kidneys, compatible with chronic medical renal disease  SBP in low 100s Pulse ox 100% on 3 L Cr 4.38 with baseline being ~ 2 LA> 2.0 BNP 4,046 Trop > 0.03  Subjective: No acute complaint no fever no chills.  No urinary complaint.  No nausea no vomiting.  Assessment & Plan: AKI (acute kidney injury) on CKD 3  metabolic acidosis Acute on Chronic systolic CHF (congestive heart failure) Pulmonary HTN   - AKI due to low cardiac output in setting of Lasix, Aldactone and Losartan use- all 3 being held - renal ultrasound consistent with medical renal disease - heart failure team assisting with management of CHF and Dobutamine has been started to increase cardiac output - s/p right heart cath showing moderately elevated pulmonary pressure-  - cont dobutamine Nephrology following as well.  Appreciate their assistance as well.  Active Problems: Hypotension/ Lactic acidosis - Coreg being held - Lactic acidosis resolved  Elevated troponin - mild elevation at 0.03 related to above AKI and heart failure-   Short 5 beat run of V tach - on 5/30- Coreg on hold currently by cardiology    Diabetes mellitus 2   - last A1c 9.3 on 3/22- poor control with nephropathy - she states she no longer takes  long acting insulin- will follow sugars int he hospital and see if we need to initiate this -  SSI ordered- sugars are well controlled - holding Glucophage  Thrush - possibly related to uncontrolled DM - started Nystatin- improving  GERD - has heart burn frequently- treat thrush which may be extending into esophagus with Nystatin swish and swallow - cont Protonix daily    CAD (coronary artery disease), native coronary artery - cont ASA and Brillinta - cont Atorvastatin    COPD (chronic obstructive pulmonary disease)  - no exacerbation noted  Asymptomatic bacteriuria.  With E. coli Patient was started on IV ceftriaxone by cardiology but the patient does not have any symptoms. We will discontinue antibiotics.  Constipation. Continue stool softener.  Time spent in minutes: 35 DVT prophylaxis: SCDs Code Status: Full code Family Communication:  Disposition Plan: f/u Cr- PT recommends HHPT  Consultants:   Heart failure team Procedures:   2 D ECHO 1. The left ventricle has severely reduced systolic function, with an ejection fraction of 25-30%. The cavity size was normal. Indeterminate diastolic filling due to E-A fusion.  2. LVEF is severely depressed at approximately 25% with hypokinesi of the mid LV; akinesis of the distal 1/3 of LV No significant change in LVEF from echo report from Feb 2020.  3. The right ventricle has normal systolic function. The cavity was normal. There is no increase in right ventricular wall thickness.  4. Left atrial size  was mildly dilated.  5. Trivial pericardial effusion is present.  6. The mitral valve is abnormal. Mild thickening of the mitral valve leaflet.  7. The aortic valve is tricuspid. Mild thickening of the aortic valve. Mild calcification of the aortic valve.  8. The interatrial septum was not assessed.  Antimicrobials:  Anti-infectives (From admission, onward)   Start     Dose/Rate Route Frequency Ordered Stop   12/01/18 1400   cefTRIAXone (ROCEPHIN) 1 g in sodium chloride 0.9 % 100 mL IVPB     1 g 200 mL/hr over 30 Minutes Intravenous Every 24 hours 12/01/18 1246     12/01/18 1230  cefTRIAXone (ROCEPHIN) injection 1 g  Status:  Discontinued     1 g Intramuscular Every 24 hours 12/01/18 1227 12/01/18 1246   11/29/18 1500  cefTRIAXone (ROCEPHIN) 1 g in sodium chloride 0.9 % 100 mL IVPB  Status:  Discontinued    Note to Pharmacy:  Pharmacy to dose.   1 g 200 mL/hr over 30 Minutes Intravenous Every 24 hours 11/29/18 1416 11/29/18 1519       Objective: Vitals:   12/01/18 0821 12/01/18 1122 12/01/18 1555 12/01/18 2044  BP: 112/72 102/66 (!) 90/53 112/78  Pulse: 84 85 92 92  Resp: 19 19 14  (!) 21  Temp: 98.6 F (37 C) 98.5 F (36.9 C) 98.2 F (36.8 C) 98.3 F (36.8 C)  TempSrc: Oral Oral Oral Oral  SpO2: 97% 100% 96% 98%  Weight:      Height:        Intake/Output Summary (Last 24 hours) at 12/01/2018 2049 Last data filed at 12/01/2018 1836 Gross per 24 hour  Intake 670.68 ml  Output 700 ml  Net -29.32 ml   Filed Weights   11/29/18 0500 11/30/18 0355 12/01/18 0300  Weight: 50 kg 50.3 kg 50.7 kg    Examination: General exam: Appears comfortable  HEENT: PERRLA, oral mucosa moist, no sclera icterus or thrush Respiratory system: Clear to auscultation. Respiratory effort normal. Cardiovascular system: S1 & S2 heard,  No murmurs  Gastrointestinal system: Abdomen soft, non-tender, nondistended. Normal bowel sounds   Central nervous system: Alert and oriented. No focal neurological deficits. Extremities: No cyanosis, clubbing or edema Skin: No rashes or ulcers Psychiatry:  Mood & affect appropriate.   Data Reviewed: I have personally reviewed following labs and imaging studies  CBC: Recent Labs  Lab 11/25/18 0250 11/27/18 0936 11/27/18 0937  WBC 7.2  --   --   HGB 9.2* 8.5* 8.5*  HCT 28.3* 25.0* 25.0*  MCV 82.3  --   --   PLT 207  --   --    Basic Metabolic Panel: Recent Labs  Lab 11/25/18  0250 11/26/18 0406 11/27/18 0426  11/27/18 0937 11/28/18 0444 11/29/18 0331 11/30/18 0255 12/01/18 0306  NA 140 139 137   < > 141 138 135 137 138  K 4.7 4.1 3.8   < > 3.9 3.9 3.8 3.5 3.6  CL 113* 112* 111  --   --  110 107 108 109  CO2 14* 15* 16*  --   --  16* 16* 17* 15*  GLUCOSE 85 103* 126*  --   --  142* 129* 108* 126*  BUN 76* 77* 67*  --   --  60* 56* 51* 49*  CREATININE 4.57* 4.86* 4.73*  --   --  4.53* 4.10* 4.00* 3.73*  CALCIUM 9.2 9.1 8.8*  --   --  9.0 8.7* 8.8* 9.0  MG  1.4* 2.1  --   --   --   --   --   --   --   PHOS  --   --   --   --   --   --   --  3.7 3.3   < > = values in this interval not displayed.   GFR: Estimated Creatinine Clearance: 11.1 mL/min (A) (by C-G formula based on SCr of 3.73 mg/dL (H)). Liver Function Tests: Recent Labs  Lab 11/30/18 0255 12/01/18 0306  ALBUMIN 3.2* 3.3*   No results for input(s): LIPASE, AMYLASE in the last 168 hours. No results for input(s): AMMONIA in the last 168 hours. Coagulation Profile: No results for input(s): INR, PROTIME in the last 168 hours. Cardiac Enzymes: No results for input(s): CKTOTAL, CKMB, CKMBINDEX, TROPONINI in the last 168 hours. BNP (last 3 results) No results for input(s): PROBNP in the last 8760 hours. HbA1C: No results for input(s): HGBA1C in the last 72 hours. CBG: Recent Labs  Lab 11/30/18 1655 11/30/18 2207 12/01/18 0638 12/01/18 1121 12/01/18 1645  GLUCAP 119* 130* 94 206* 107*   Lipid Profile: No results for input(s): CHOL, HDL, LDLCALC, TRIG, CHOLHDL, LDLDIRECT in the last 72 hours. Thyroid Function Tests: No results for input(s): TSH, T4TOTAL, FREET4, T3FREE, THYROIDAB in the last 72 hours. Anemia Panel: Recent Labs    11/29/18 0331  FERRITIN 328*  TIBC 239*  IRON 28   Urine analysis:    Component Value Date/Time   COLORURINE YELLOW 11/28/2018 2135   APPEARANCEUR HAZY (A) 11/28/2018 2135   LABSPEC 1.008 11/28/2018 2135   PHURINE 5.0 11/28/2018 2135   GLUCOSEU  NEGATIVE 11/28/2018 2135   HGBUR MODERATE (A) 11/28/2018 2135   BILIRUBINUR NEGATIVE 11/28/2018 2135   KETONESUR NEGATIVE 11/28/2018 2135   PROTEINUR NEGATIVE 11/28/2018 2135   NITRITE NEGATIVE 11/28/2018 2135   LEUKOCYTESUR LARGE (A) 11/28/2018 2135   Sepsis Labs: @LABRCNTIP (procalcitonin:4,lacticidven:4) ) Recent Results (from the past 240 hour(s))  SARS Coronavirus 2 (CEPHEID - Performed in [facility] hospital lab), Hosp Order     Status: None   Collection Time: 11/24/18  8:27 PM  Result Value Ref Range Status   SARS Coronavirus 2 NEGATIVE NEGATIVE Final    Comment: (NOTE) If result is NEGATIVE SARS-CoV-2 target nucleic acids are NOT DETECTED. The SARS-CoV-2 RNA is generally detectable in upper and lower  respiratory specimens during the acute phase of infection. The lowest  concentration of SARS-CoV-2 viral copies this assay can detect is 250  copies / mL. A negative result does not preclude SARS-CoV-2 infection  and should not be used as the sole basis for treatment or other  patient management decisions.  A negative result may occur with  improper specimen collection / handling, submission of specimen other  than nasopharyngeal swab, presence of viral mutation(s) within the  areas targeted by this assay, and inadequate number of viral copies  (<250 copies / mL). A negative result must be combined with clinical  observations, patient history, and epidemiological information. If result is POSITIVE SARS-CoV-2 target nucleic acids are DETECTED. The SARS-CoV-2 RNA is generally detectable in upper and lower  respiratory specimens dur ing the acute phase of infection.  Positive  results are indicative of active infection with SARS-CoV-2.  Clinical  correlation with patient history and other diagnostic information is  necessary to determine patient infection status.  Positive results do  not rule out bacterial infection or co-infection with other viruses. If result is  PRESUMPTIVE POSTIVE SARS-CoV-2  nucleic acids MAY BE PRESENT.   A presumptive positive result was obtained on the submitted specimen  and confirmed on repeat testing.  While 2019 novel coronavirus  (SARS-CoV-2) nucleic acids may be present in the submitted sample  additional confirmatory testing may be necessary for epidemiological  and / or clinical management purposes  to differentiate between  SARS-CoV-2 and other Sarbecovirus currently known to infect humans.  If clinically indicated additional testing with an alternate test  methodology [phone]) is advised. The SARS-CoV-2 RNA is generally  detectable in upper and lower respiratory sp ecimens during the acute  phase of infection. The expected result is Negative. Fact Sheet for Patients:  StrictlyIdeas.no Fact Sheet for Healthcare Providers: BankingDealers.co.za This test is not yet approved or cleared by the [place] FDA and has been authorized for detection and/or diagnosis of SARS-CoV-2 by FDA under an Emergency Use Authorization (EUA).  This EUA will remain in effect (meaning this test can be used) for the duration of the COVID-19 declaration under Section 564(b)(1) of the Act, 21 U.S.C. section 360bbb-3(b)(1), unless the authorization is terminated or revoked sooner. Performed at [facility], [place] [address], [place], [place] [address]   Culture, Urine     Status: Abnormal   Collection Time: 11/29/18  2:55 PM  Result Value Ref Range Status   Specimen Description URINE, CLEAN CATCH  Final   Special Requests   Final    NONE Performed at [facility], [place] [address], [place], [place] [address]    Culture >=100,000 COLONIES/mL ESCHERICHIA COLI (A)  Final   Report Status 12/01/2018 FINAL  Final   Organism ID, Bacteria ESCHERICHIA COLI (A)  Final      Susceptibility   Escherichia coli - MIC*    AMPICILLIN >=32 RESISTANT Resistant     CEFAZOLIN <=4 SENSITIVE  Sensitive     CEFTRIAXONE <=1 SENSITIVE Sensitive     CIPROFLOXACIN >=4 RESISTANT Resistant     GENTAMICIN <=1 SENSITIVE Sensitive     IMIPENEM <=0.25 SENSITIVE Sensitive     NITROFURANTOIN <=16 SENSITIVE Sensitive     TRIMETH/SULFA <=20 SENSITIVE Sensitive     AMPICILLIN/SULBACTAM >=32 RESISTANT Resistant     PIP/TAZO <=4 SENSITIVE Sensitive     Extended ESBL NEGATIVE Sensitive     * >=100,000 COLONIES/mL ESCHERICHIA COLI         Radiology Studies: No results found.    Scheduled Meds: . aspirin  81 mg Oral Daily  . atorvastatin  40 mg Oral q1800  . feeding supplement (GLUCERNA SHAKE)  237 mL Oral TID BM  . furosemide  40 mg Oral Daily  . heparin injection (subcutaneous)  5,000 Units Subcutaneous Q8H  . insulin aspart  0-5 Units Subcutaneous QHS  . insulin aspart  0-9 Units Subcutaneous TID WC  . nystatin  5 mL Mouth/Throat QID  . pantoprazole  40 mg Oral Daily  . senna-docusate  1 tablet Oral BID  . ticagrelor  90 mg Oral BID   Continuous Infusions: . cefTRIAXone (ROCEPHIN)  IV Stopped (12/01/18 1559)  . DOBUTamine 2.5 mcg/kg/min (12/01/18 1600)  . ferumoxytol 510 mg (11/29/18 1306)     LOS: 7 days      Berle Mull, MD [facility] Pager: [email or web address] Password [facility] 12/01/2018, 8:49 PM

## 2018-12-02 LAB — RENAL FUNCTION PANEL
Albumin: 3.1 g/dL — ABNORMAL LOW (ref 3.5–5.0)
Anion gap: 10 (ref 5–15)
BUN: 45 mg/dL — ABNORMAL HIGH (ref 8–23)
CO2: 17 mmol/L — ABNORMAL LOW (ref 22–32)
Calcium: 8.9 mg/dL (ref 8.9–10.3)
Chloride: 110 mmol/L (ref 98–111)
Creatinine, Ser: 3.61 mg/dL — ABNORMAL HIGH (ref 0.44–1.00)
GFR calc Af Amer: 14 mL/min — ABNORMAL LOW (ref >60)
GFR calc non Af Amer: 12 mL/min — ABNORMAL LOW (ref >60)
Glucose, Bld: 150 mg/dL — ABNORMAL HIGH (ref 70–99)
Phosphorus: 3.5 mg/dL (ref 2.5–4.6)
Potassium: 3.4 mmol/L — ABNORMAL LOW (ref 3.5–5.1)
Sodium: 137 mmol/L (ref 135–145)

## 2018-12-02 LAB — C3 COMPLEMENT: C3 Complement: 126 mg/dL (ref 82–167)

## 2018-12-02 LAB — GLUCOSE, CAPILLARY
Glucose-Capillary: 131 mg/dL — ABNORMAL HIGH (ref 70–99)
Glucose-Capillary: 121 mg/dL — ABNORMAL HIGH (ref 70–99)
Glucose-Capillary: 209 mg/dL — ABNORMAL HIGH (ref 70–99)
Glucose-Capillary: 91 mg/dL (ref 70–99)

## 2018-12-02 LAB — C4 COMPLEMENT: Complement C4, Body Fluid: 51 mg/dL — ABNORMAL HIGH (ref 14–44)

## 2018-12-02 MED ORDER — POTASSIUM CHLORIDE CRYS ER 20 MEQ PO TBCR
40.0000 meq | EXTENDED_RELEASE_TABLET | Freq: Once | ORAL | Status: DC
  Filled 2018-12-02: qty 2

## 2018-12-02 MED ORDER — SODIUM CHLORIDE 0.9 % IV SOLN
510.0000 mg | Freq: Once | INTRAVENOUS | Status: AC
  Administered 2018-12-02: 510 mg via INTRAVENOUS
  Filled 2018-12-02: qty 17

## 2018-12-02 NOTE — Progress Notes (Signed)
Progress Note  Patient Name: [name] Date of Encounter: 12/02/2018  Primary Cardiologist: No primary care provider on file. DB has seen  Subjective   She is fairly comfortable in bed.  Denies any significant shortness of breath at this point.  Creatinine continues to mildly improved.  On dobutamine  Inpatient Medications    Scheduled Meds: . aspirin  81 mg Oral Daily  . atorvastatin  40 mg Oral q1800  . feeding supplement (GLUCERNA SHAKE)  237 mL Oral TID BM  . furosemide  40 mg Oral Daily  . heparin injection (subcutaneous)  5,000 Units Subcutaneous Q8H  . insulin aspart  0-5 Units Subcutaneous QHS  . insulin aspart  0-9 Units Subcutaneous TID WC  . nystatin  5 mL Mouth/Throat QID  . pantoprazole  40 mg Oral Daily  . potassium chloride  40 mEq Oral Once  . senna-docusate  1 tablet Oral BID  . ticagrelor  90 mg Oral BID   Continuous Infusions: . cefTRIAXone (ROCEPHIN)  IV Stopped (12/01/18 1559)  . DOBUTamine 2.5 mcg/kg/min (12/02/18 0400)  . ferumoxytol     PRN Meds:    Vital Signs    Vitals:   12/02/18 0010 12/02/18 0424 12/02/18 0630 12/02/18 0743  BP: (!) 89/54 104/64  110/66  Pulse: (!) 111 86  87  Resp: (!) 24 16 15  (!) 25  Temp: 98.1 F (36.7 C) 98.1 F (36.7 C)  98.7 F (37.1 C)  TempSrc: Oral Oral  Oral  SpO2: 97% 97%  97%  Weight:   50.8 kg   Height:        Intake/Output Summary (Last 24 hours) at 12/02/2018 1138 Last data filed at 12/02/2018 0933 Gross per 24 hour  Intake 1054.26 ml  Output 700 ml  Net 354.26 ml   Last 3 Weights 12/02/2018 12/01/2018 11/30/2018  Weight (lbs) 111 lb 15.9 oz 111 lb 11.2 oz 110 lb 14.3 oz  Weight (kg) 50.8 kg 50.667 kg 50.3 kg      Telemetry    Sinus rhythm no adverse arrhythmias- Personally Reviewed  ECG    Sinus rhythm nonspecific T wave changes artifact 11/24/2018- Personally Reviewed  Physical Exam   GEN: No acute distress.   Neck:  Mid neck JVD Cardiac: RRR, no murmurs, rubs, or gallops.   Respiratory: Clear to auscultation bilaterally. GI: Soft, nontender, non-distended  MS:  Trace lower extremity edema; No deformity. Neuro:  Nonfocal  Psych: Normal affect   Labs    Chemistry Recent Labs  Lab 11/30/18 0255 12/01/18 0306 12/02/18 0319  NA 137 138 137  K 3.5 3.6 3.4*  CL 108 109 110  CO2 17* 15* 17*  GLUCOSE 108* 126* 150*  BUN 51* 49* 45*  CREATININE 4.00* 3.73* 3.61*  CALCIUM 8.8* 9.0 8.9  ALBUMIN 3.2* 3.3* 3.1*  GFRNONAA 11* 12* 12*  GFRAA 12* 13* 14*  ANIONGAP 12 14 10      Hematology Recent Labs  Lab 11/27/18 0936 11/27/18 0937  HGB 8.5* 8.5*  HCT 25.0* 25.0*    Cardiac EnzymesNo results for input(s): TROPONINI in the last 168 hours. No results for input(s): TROPIPOC in the last 168 hours.   BNPNo results for input(s): BNP, PROBNP in the last 168 hours.   DDimer No results for input(s): DDIMER in the last 168 hours.   Radiology    No results found.  Cardiac Studies   RHC 6/1 RA = 7 RV = 11/7 PA = 55/18 (33) PCW = 25 (v =  49) Fick cardiac output/index = 4.0/2.7 PVR = 1.9 WU FA sat = 98% PA sat = 58%, 60%  Creatinine trending down 4.8 -> 4.7 -> 4.5 -> 4.1 -> 4.0 -> 3.7 -> 3.6  Patient Profile     68 y.o. female with acute on chronic systolic heart failure, hypotension, CAD pulmonary hypertension COPD deconditioning, acute kidney injury, considering home inotrope.  Assessment & Plan    Acute on chronic systolic heart failure - Reviewed Dr. Clayborne Dana note.  Plan is to continue with low-dose dobutamine and consider home inotropes.  Appreciate renal assessment as well.  They will continue to follow from a far.  Per their recommendations, continue to hold the angiotensin receptor blocker.  No evidence at this point of overdiuresis. - EF 25%, NYHA class IV.  Reviewed right heart cath.  Coronary artery disease - On aspirin and ticagrelor with prior DES to LAD August 10, 2018.  Pulmonary hypertension - Mixed.  COPD - Reports  tobacco cessation.  Deconditioning -PT has been consulted     For questions or updates, please contact [place] Please consult [email or web address] for contact info under        Signed, Candee Furbish, MD  12/02/2018, 11:38 AM

## 2018-12-02 NOTE — Plan of Care (Signed)
Care plans reviewed and patient is progressing.  

## 2018-12-02 NOTE — Progress Notes (Signed)
PROGRESS NOTE    [name]   MEQ:[id number]  DOB: 12/03/1950  DOA: 11/24/2018 PCP: [name], MD   Brief Narrative:  [name] is a 68 y.o. female with medical history significant of DM2, ichronic systolic CHF with a EF of 20%, CAD with stent to prox LAD in 2/20, CKD 3 with baseline creatinine of 2, COPD/ chronic respiratory failure on 1 to 2 L of nasal cannula only at night.  She presents with dyspnea that has not improved with oral Lasix increased from 40 to 80 mg daily 2 days prior to admission by cardiology. She was told to come into the ED for AKI in setting of fluid overload.  CXR> central venous congestion, effusions only slightly improved from prior Renal US> Mildly hyperechoic kidneys, compatible with chronic medical renal disease  SBP in low 100s Pulse ox 100% on 3 L Cr 4.38 with baseline being ~ 2 LA> 2.0 BNP 4,046 Trop > 0.03  Subjective: Still constipated, no nausea no vomiting no abdominal pain.  Assessment & Plan: AKI (acute kidney injury) on CKD 3  metabolic acidosis Acute on Chronic systolic CHF (congestive heart failure) Pulmonary HTN   - AKI due to low cardiac output in setting of Lasix, Aldactone and Losartan use- all 3 being held - renal ultrasound consistent with medical renal disease - heart failure team assisting with management of CHF and Dobutamine has been started to increase cardiac output - s/p right heart cath showing moderately elevated pulmonary pressure-  - cont dobutamine Nephrology signed off as well.  Appreciate their assistance as well.  Active Problems: Hypotension/ Lactic acidosis - Coreg being held - Lactic acidosis resolved  Elevated troponin - mild elevation at 0.03 related to above AKI and heart failure-   Short 5 beat run of V tach - on 5/30- Coreg on hold currently by cardiology    Diabetes mellitus 2   - last A1c 9.3 on 3/22- poor control with nephropathy - she states she no longer takes long acting insulin- will  follow sugars int he hospital and see if we need to initiate this -  SSI ordered- sugars are well controlled - holding Glucophage  Thrush - possibly related to uncontrolled DM - started Nystatin- improving  GERD - has heart burn frequently- treat thrush which may be extending into esophagus with Nystatin swish and swallow - cont Protonix daily    CAD (coronary artery disease), native coronary artery - cont ASA and Brillinta - cont Atorvastatin    COPD (chronic obstructive pulmonary disease)  - no exacerbation noted  Asymptomatic bacteriuria.  With E. coli Patient was started on IV ceftriaxone by cardiology but the patient does not have any symptoms. We will discontinue antibiotics.  Constipation. Continue stool softener.  Time spent in minutes: 35 DVT prophylaxis: SCDs Code Status: Full code Family Communication:  Disposition Plan: f/u Cr- PT recommends HHPT  Consultants:   Heart failure team Procedures:   2 D ECHO 1. The left ventricle has severely reduced systolic function, with an ejection fraction of 25-30%. The cavity size was normal. Indeterminate diastolic filling due to E-A fusion.  2. LVEF is severely depressed at approximately 25% with hypokinesi of the mid LV; akinesis of the distal 1/3 of LV No significant change in LVEF from echo report from Feb 2020.  3. The right ventricle has normal systolic function. The cavity was normal. There is no increase in right ventricular wall thickness.  4. Left atrial size was mildly dilated.  5. Trivial  pericardial effusion is present.  6. The mitral valve is abnormal. Mild thickening of the mitral valve leaflet.  7. The aortic valve is tricuspid. Mild thickening of the aortic valve. Mild calcification of the aortic valve.  8. The interatrial septum was not assessed.  Antimicrobials:  Anti-infectives (From admission, onward)   Start     Dose/Rate Route Frequency Ordered Stop   12/01/18 1400  cefTRIAXone (ROCEPHIN) 1 g in  sodium chloride 0.9 % 100 mL IVPB  Status:  Discontinued     1 g 200 mL/hr over 30 Minutes Intravenous Every 24 hours 12/01/18 1246 12/02/18 1206   12/01/18 1230  cefTRIAXone (ROCEPHIN) injection 1 g  Status:  Discontinued     1 g Intramuscular Every 24 hours 12/01/18 1227 12/01/18 1246   11/29/18 1500  cefTRIAXone (ROCEPHIN) 1 g in sodium chloride 0.9 % 100 mL IVPB  Status:  Discontinued    Note to Pharmacy:  Pharmacy to dose.   1 g 200 mL/hr over 30 Minutes Intravenous Every 24 hours 11/29/18 1416 11/29/18 1519       Objective: Vitals:   12/02/18 0630 12/02/18 0743 12/02/18 1145 12/02/18 1626  BP:  110/66 120/70   Pulse:  87 83   Resp: 15 (!) 25 (!) 22   Temp:  98.7 F (37.1 C) 98.7 F (37.1 C) 98.8 F (37.1 C)  TempSrc:  Oral Oral Oral  SpO2:  97% 98%   Weight: 50.8 kg     Height:        Intake/Output Summary (Last 24 hours) at 12/02/2018 1910 Last data filed at 12/02/2018 1718 Gross per 24 hour  Intake 800.9 ml  Output 900 ml  Net -99.1 ml   Filed Weights   11/30/18 0355 12/01/18 0300 12/02/18 0630  Weight: 50.3 kg 50.7 kg 50.8 kg    Examination: General exam: Appears comfortable  HEENT: PERRLA, oral mucosa moist, no sclera icterus or thrush Respiratory system: Clear to auscultation. Respiratory effort normal. Cardiovascular system: S1 & S2 heard,  No murmurs  Gastrointestinal system: Abdomen soft, non-tender, nondistended. Normal bowel sounds   Central nervous system: Alert and oriented. No focal neurological deficits. Extremities: No cyanosis, clubbing or edema Skin: No rashes or ulcers Psychiatry:  Mood & affect appropriate.   Data Reviewed: I have personally reviewed following labs and imaging studies  CBC: Recent Labs  Lab 11/27/18 0936 11/27/18 0937  HGB 8.5* 8.5*  HCT 25.0* 44.3*   Basic Metabolic Panel: Recent Labs  Lab 11/26/18 0406  11/28/18 0444 11/29/18 0331 11/30/18 0255 12/01/18 0306 12/02/18 0319  NA 139   < > 138 135 137 138 137   K 4.1   < > 3.9 3.8 3.5 3.6 3.4*  CL 112*   < > 110 107 108 109 110  CO2 15*   < > 16* 16* 17* 15* 17*  GLUCOSE 103*   < > 142* 129* 108* 126* 150*  BUN 77*   < > 60* 56* 51* 49* 45*  CREATININE 4.86*   < > 4.53* 4.10* 4.00* 3.73* 3.61*  CALCIUM 9.1   < > 9.0 8.7* 8.8* 9.0 8.9  MG 2.1  --   --   --   --   --   --   PHOS  --   --   --   --  3.7 3.3 3.5   < > = values in this interval not displayed.   GFR: Estimated Creatinine Clearance: 11.5 mL/min (A) (by C-G formula based on  SCr of 3.61 mg/dL (H)). Liver Function Tests: Recent Labs  Lab 11/30/18 0255 12/01/18 0306 12/02/18 0319  ALBUMIN 3.2* 3.3* 3.1*   No results for input(s): LIPASE, AMYLASE in the last 168 hours. No results for input(s): AMMONIA in the last 168 hours. Coagulation Profile: No results for input(s): INR, PROTIME in the last 168 hours. Cardiac Enzymes: No results for input(s): CKTOTAL, CKMB, CKMBINDEX, TROPONINI in the last 168 hours. BNP (last 3 results) No results for input(s): PROBNP in the last 8760 hours. HbA1C: No results for input(s): HGBA1C in the last 72 hours. CBG: Recent Labs  Lab 12/01/18 1645 12/01/18 2126 12/02/18 0627 12/02/18 1142 12/02/18 1629  GLUCAP 107* 192* 131* 121* 209*   Lipid Profile: No results for input(s): CHOL, HDL, LDLCALC, TRIG, CHOLHDL, LDLDIRECT in the last 72 hours. Thyroid Function Tests: No results for input(s): TSH, T4TOTAL, FREET4, T3FREE, THYROIDAB in the last 72 hours. Anemia Panel: No results for input(s): VITAMINB12, FOLATE, FERRITIN, TIBC, IRON, RETICCTPCT in the last 72 hours. Urine analysis:    Component Value Date/Time   COLORURINE YELLOW 11/28/2018 2135   APPEARANCEUR HAZY (A) 11/28/2018 2135   LABSPEC 1.008 11/28/2018 2135   PHURINE 5.0 11/28/2018 2135   GLUCOSEU NEGATIVE 11/28/2018 2135   HGBUR MODERATE (A) 11/28/2018 2135   BILIRUBINUR NEGATIVE 11/28/2018 2135   KETONESUR NEGATIVE 11/28/2018 2135   PROTEINUR NEGATIVE 11/28/2018 2135    NITRITE NEGATIVE 11/28/2018 2135   LEUKOCYTESUR LARGE (A) 11/28/2018 2135   Sepsis Labs: @LABRCNTIP (procalcitonin:4,lacticidven:4) ) Recent Results (from the past 240 hour(s))  SARS Coronavirus 2 (CEPHEID - Performed in [place] hospital lab), Hosp Order     Status: None   Collection Time: 11/24/18  8:27 PM  Result Value Ref Range Status   SARS Coronavirus 2 NEGATIVE NEGATIVE Final    Comment: (NOTE) If result is NEGATIVE SARS-CoV-2 target nucleic acids are NOT DETECTED. The SARS-CoV-2 RNA is generally detectable in upper and lower  respiratory specimens during the acute phase of infection. The lowest  concentration of SARS-CoV-2 viral copies this assay can detect is 250  copies / mL. A negative result does not preclude SARS-CoV-2 infection  and should not be used as the sole basis for treatment or other  patient management decisions.  A negative result may occur with  improper specimen collection / handling, submission of specimen other  than nasopharyngeal swab, presence of viral mutation(s) within the  areas targeted by this assay, and inadequate number of viral copies  (<250 copies / mL). A negative result must be combined with clinical  observations, patient history, and epidemiological information. If result is POSITIVE SARS-CoV-2 target nucleic acids are DETECTED. The SARS-CoV-2 RNA is generally detectable in upper and lower  respiratory specimens dur ing the acute phase of infection.  Positive  results are indicative of active infection with SARS-CoV-2.  Clinical  correlation with patient history and other diagnostic information is  necessary to determine patient infection status.  Positive results do  not rule out bacterial infection or co-infection with other viruses. If result is PRESUMPTIVE POSTIVE SARS-CoV-2 nucleic acids MAY BE PRESENT.   A presumptive positive result was obtained on the submitted specimen  and confirmed on repeat testing.  While 2019 novel  coronavirus  (SARS-CoV-2) nucleic acids may be present in the submitted sample  additional confirmatory testing may be necessary for epidemiological  and / or clinical management purposes  to differentiate between  SARS-CoV-2 and other Sarbecovirus currently known to infect humans.  If clinically indicated  additional testing with an alternate test  methodology [phone]) is advised. The SARS-CoV-2 RNA is generally  detectable in upper and lower respiratory sp ecimens during the acute  phase of infection. The expected result is Negative. Fact Sheet for Patients:  StrictlyIdeas.no Fact Sheet for Healthcare Providers: BankingDealers.co.za This test is not yet approved or cleared by the [place] FDA and has been authorized for detection and/or diagnosis of SARS-CoV-2 by FDA under an Emergency Use Authorization (EUA).  This EUA will remain in effect (meaning this test can be used) for the duration of the COVID-19 declaration under Section 564(b)(1) of the Act, 21 U.S.C. section 360bbb-3(b)(1), unless the authorization is terminated or revoked sooner. Performed at [facility], [place] [address] [place], [place] [address]   Culture, Urine     Status: Abnormal   Collection Time: 11/29/18  2:55 PM  Result Value Ref Range Status   Specimen Description URINE, CLEAN CATCH  Final   Special Requests   Final    NONE Performed at [facility], [place] [address], [place], [place] [address]    Culture >=100,000 COLONIES/mL ESCHERICHIA COLI (A)  Final   Report Status 12/01/2018 FINAL  Final   Organism ID, Bacteria ESCHERICHIA COLI (A)  Final      Susceptibility   Escherichia coli - MIC*    AMPICILLIN >=32 RESISTANT Resistant     CEFAZOLIN <=4 SENSITIVE Sensitive     CEFTRIAXONE <=1 SENSITIVE Sensitive     CIPROFLOXACIN >=4 RESISTANT Resistant     GENTAMICIN <=1 SENSITIVE Sensitive     IMIPENEM <=0.25 SENSITIVE Sensitive      NITROFURANTOIN <=16 SENSITIVE Sensitive     TRIMETH/SULFA <=20 SENSITIVE Sensitive     AMPICILLIN/SULBACTAM >=32 RESISTANT Resistant     PIP/TAZO <=4 SENSITIVE Sensitive     Extended ESBL NEGATIVE Sensitive     * >=100,000 COLONIES/mL ESCHERICHIA COLI         Radiology Studies: No results found.    Scheduled Meds: . aspirin  81 mg Oral Daily  . atorvastatin  40 mg Oral q1800  . feeding supplement (GLUCERNA SHAKE)  237 mL Oral TID BM  . furosemide  40 mg Oral Daily  . heparin injection (subcutaneous)  5,000 Units Subcutaneous Q8H  . insulin aspart  0-5 Units Subcutaneous QHS  . insulin aspart  0-9 Units Subcutaneous TID WC  . nystatin  5 mL Mouth/Throat QID  . pantoprazole  40 mg Oral Daily  . potassium chloride  40 mEq Oral Once  . senna-docusate  1 tablet Oral BID  . ticagrelor  90 mg Oral BID   Continuous Infusions: . DOBUTamine 2.5 mcg/kg/min (12/02/18 0400)     LOS: 8 days      Berle Mull, MD [facility] Pager: [email or web address] Password [email or web address] 12/02/2018, 7:10 PM

## 2018-12-02 NOTE — Progress Notes (Signed)
Subjective:  700 UOP- BUN and crt down again - CRP low- other serology labs negative  Objective Vital signs in last 24 hours: Vitals:   12/02/18 0010 12/02/18 0424 12/02/18 0630 12/02/18 0743  BP: (!) 89/54 104/64  110/66  Pulse: (!) 111 86  87  Resp: (!) 24 16 15  (!) 25  Temp: 98.1 F (36.7 C) 98.1 F (36.7 C)  98.7 F (37.1 C)  TempSrc: Oral Oral  Oral  SpO2: 97% 97%  97%  Weight:   50.8 kg   Height:       Weight change: 0.133 kg  Intake/Output Summary (Last 24 hours) at 12/02/2018 3419 Last data filed at 12/02/2018 0400 Gross per 24 hour  Intake 746.46 ml  Output 700 ml  Net 46.46 ml    Assessment/Plan: 68 year old BF with COPD, ischemic cardiomyopathy- EF 20- had mild CKD prior to now 3 hospitalizations for cardiac issues  1.Renal- pt with mild CKD prior to events - crt 1.1- 1.3- then approx 2 approx 2 mos ago.  Now with volume overload and worsening AKI- soft BP on ARB  improving the last few days off ARB and on dobutamine.  Renal ultrasound c/w medical renal disease.  U/A without protein but with RBC and WBC- sent for culture.  Suspect CKD of 2.0 on the basis of extensive hosp earlier this year.  Acute change over the last 2 mos in the setting of attempted diuresis and  ARB- again suspect hemodynamic issue but with active urine will consider other possibilities- send for culture- ecoli- r to cipro- will treat.   serologies seeming negative.  I would continue to hold the ARB.  No indications for dialysis- in fact renal function trending better, also not c/w acute GN.  Renal will sign off for now and follow at a distance.  I will have her eventually follow up with me post hospitalization   2. Hypertension/volume  - volume status seems good right now.   have  resumed 40 daily of lasix- her home dose.  Now talking home inotropes  3. Anemia  - has been dropping slowly since March - iron stores low, repleting- will give second and last dose of feraheme today - gave ESA  one dose on 6/2     Louis Meckel    Labs: Basic Metabolic Panel: Recent Labs  Lab 11/30/18 0255 12/01/18 0306 12/02/18 0319  NA 137 138 137  K 3.5 3.6 3.4*  CL 108 109 110  CO2 17* 15* 17*  GLUCOSE 108* 126* 150*  BUN 51* 49* 45*  CREATININE 4.00* 3.73* 3.61*  CALCIUM 8.8* 9.0 8.9  PHOS 3.7 3.3 3.5   Liver Function Tests: Recent Labs  Lab 11/30/18 0255 12/01/18 0306 12/02/18 0319  ALBUMIN 3.2* 3.3* 3.1*   No results for input(s): LIPASE, AMYLASE in the last 168 hours. No results for input(s): AMMONIA in the last 168 hours. CBC: Recent Labs  Lab 11/27/18 0936 11/27/18 0937  HGB 8.5* 8.5*  HCT 25.0* 25.0*   Cardiac Enzymes: No results for input(s): CKTOTAL, CKMB, CKMBINDEX, TROPONINI in the last 168 hours. CBG: Recent Labs  Lab 12/01/18 0638 12/01/18 1121 12/01/18 1645 12/01/18 2126 12/02/18 0627  GLUCAP 94 206* 107* 192* 131*    Iron Studies:  No results for input(s): IRON, TIBC, TRANSFERRIN, FERRITIN in the last 72 hours. Studies/Results: No results found. Medications: Infusions: . cefTRIAXone (ROCEPHIN)  IV Stopped (12/01/18 1559)  . DOBUTamine 2.5 mcg/kg/min (12/02/18 0400)  . ferumoxytol 510  mg (11/29/18 1306)    Scheduled Medications: . aspirin  81 mg Oral Daily  . atorvastatin  40 mg Oral q1800  . feeding supplement (GLUCERNA SHAKE)  237 mL Oral TID BM  . furosemide  40 mg Oral Daily  . heparin injection (subcutaneous)  5,000 Units Subcutaneous Q8H  . insulin aspart  0-5 Units Subcutaneous QHS  . insulin aspart  0-9 Units Subcutaneous TID WC  . nystatin  5 mL Mouth/Throat QID  . pantoprazole  40 mg Oral Daily  . potassium chloride  40 mEq Oral Once  . senna-docusate  1 tablet Oral BID  . ticagrelor  90 mg Oral BID    have reviewed scheduled and prn medications.  Physical Exam: General: NAD, looks good Heart: RRR Lungs: mostly clear Abdomen: soft, non tender Extremities:  minimal edema     12/02/2018,9:28 AM  LOS: 8 days

## 2018-12-03 LAB — RENAL FUNCTION PANEL
Albumin: 3.1 g/dL — ABNORMAL LOW (ref 3.5–5.0)
Anion gap: 12 (ref 5–15)
BUN: 43 mg/dL — ABNORMAL HIGH (ref 8–23)
CO2: 19 mmol/L — ABNORMAL LOW (ref 22–32)
Calcium: 9.4 mg/dL (ref 8.9–10.3)
Chloride: 108 mmol/L (ref 98–111)
Creatinine, Ser: 3.53 mg/dL — ABNORMAL HIGH (ref 0.44–1.00)
GFR calc Af Amer: 14 mL/min — ABNORMAL LOW (ref >60)
GFR calc non Af Amer: 12 mL/min — ABNORMAL LOW (ref >60)
Glucose, Bld: 116 mg/dL — ABNORMAL HIGH (ref 70–99)
Phosphorus: 3.6 mg/dL (ref 2.5–4.6)
Potassium: 3.6 mmol/L (ref 3.5–5.1)
Sodium: 139 mmol/L (ref 135–145)

## 2018-12-03 LAB — GLUCOSE, CAPILLARY
Glucose-Capillary: 115 mg/dL — ABNORMAL HIGH (ref 70–99)
Glucose-Capillary: 166 mg/dL — ABNORMAL HIGH (ref 70–99)
Glucose-Capillary: 146 mg/dL — ABNORMAL HIGH (ref 70–99)
Glucose-Capillary: 165 mg/dL — ABNORMAL HIGH (ref 70–99)

## 2018-12-03 LAB — MPO/PR-3 (ANCA) ANTIBODIES
ANCA Proteinase 3: <3.5 U/mL (ref 0.0–3.5)
Myeloperoxidase Abs: <9 U/mL (ref 0.0–9.0)

## 2018-12-03 MED ORDER — LACTULOSE 10 GM/15ML PO SOLN
20.0000 g | Freq: Once | ORAL | Status: AC
  Administered 2018-12-03: 20 g via ORAL
  Filled 2018-12-03: qty 30

## 2018-12-03 NOTE — Progress Notes (Signed)
Progress Note  Patient Name: [name] Date of Encounter: 12/03/2018  Primary Cardiologist: No primary care provider on file.  Seen by Dr. Haroldine Laws  Subjective   Still quite comfortable.  No significant chest pain or shortness of breath.  On dobutamine.  Inpatient Medications    Scheduled Meds: . aspirin  81 mg Oral Daily  . atorvastatin  40 mg Oral q1800  . feeding supplement (GLUCERNA SHAKE)  237 mL Oral TID BM  . furosemide  40 mg Oral Daily  . heparin injection (subcutaneous)  5,000 Units Subcutaneous Q8H  . insulin aspart  0-5 Units Subcutaneous QHS  . insulin aspart  0-9 Units Subcutaneous TID WC  . nystatin  5 mL Mouth/Throat QID  . pantoprazole  40 mg Oral Daily  . potassium chloride  40 mEq Oral Once  . senna-docusate  1 tablet Oral BID  . ticagrelor  90 mg Oral BID   Continuous Infusions: . DOBUTamine 2.5 mcg/kg/min (12/03/18 0500)   PRN Meds:    Vital Signs    Vitals:   12/03/18 0102 12/03/18 0550 12/03/18 0816 12/03/18 1127  BP: 102/67 108/72 108/73 100/65  Pulse: 89 91 95 92  Resp: 18 14 (!) 22 18  Temp: 98.8 F (37.1 C) 98.2 F (36.8 C) 98.6 F (37 C) 99.2 F (37.3 C)  TempSrc: Oral Oral Oral Oral  SpO2: 99% 99% 99% 96%  Weight:  49.6 kg    Height:        Intake/Output Summary (Last 24 hours) at 12/03/2018 1140 Last data filed at 12/03/2018 0900 Gross per 24 hour  Intake 1243.23 ml  Output 1250 ml  Net -6.77 ml   Last 3 Weights 12/03/2018 12/02/2018 12/01/2018  Weight (lbs) 109 lb 5.6 oz 111 lb 15.9 oz 111 lb 11.2 oz  Weight (kg) 49.6 kg 50.8 kg 50.667 kg      Telemetry    Sinus rhythm with no adverse arrhythmias noted, occasional PVCs- Personally Reviewed  ECG    Sinus rhythm nonspecific T wave changes as well as artifact on 11/24/2018 EKG- Personally Reviewed  Physical Exam   GEN: No acute distress.   Neck:  Mid neck JVD Cardiac: RRR, no murmurs, rubs, or gallops.  Respiratory: Clear to auscultation bilaterally. GI: Soft,  nontender, non-distended  MS:  Trace edema; No deformity. Neuro:  Nonfocal  Psych: Normal affect   Labs    Chemistry Recent Labs  Lab 12/01/18 0306 12/02/18 0319 12/03/18 0349  NA 138 137 139  K 3.6 3.4* 3.6  CL 109 110 108  CO2 15* 17* 19*  GLUCOSE 126* 150* 116*  BUN 49* 45* 43*  CREATININE 3.73* 3.61* 3.53*  CALCIUM 9.0 8.9 9.4  ALBUMIN 3.3* 3.1* 3.1*  GFRNONAA 12* 12* 12*  GFRAA 13* 14* 14*  ANIONGAP 14 10 12      Hematology Recent Labs  Lab 11/27/18 0936 11/27/18 0937  HGB 8.5* 8.5*  HCT 25.0* 25.0*    Cardiac EnzymesNo results for input(s): TROPONINI in the last 168 hours. No results for input(s): TROPIPOC in the last 168 hours.   BNPNo results for input(s): BNP, PROBNP in the last 168 hours.   DDimer No results for input(s): DDIMER in the last 168 hours.   Radiology    No results found.  Cardiac Studies   RHC 6/1 RA = 7 RV = 11/7 PA = 55/18 (33) PCW = 25 (v = 35) Fick cardiac output/index = 4.0/2.7 PVR = 1.9 WU FA sat = 98% PA  sat = 58%, 60%  Creatinine trending down 4.8 -> 4.7 -> 4.5 -> 4.1 -> 4.0-> 3.7 -> 3.6 -> 3.5   Patient Profile     68 y.o. female with acute on chronic systolic heart failure EF 25% NYHA class IV recent right heart catheterization with hypotension COPD deconditioning acute kidney injury considering home inotrope dobutamine  Assessment & Plan    Acute on chronic systolic heart failure - Previous notes reviewed.  She is going to be considered for potential home inotropes, dobutamine.  Continuing to hold traditional heart failure medications such as angiotensin receptor blocker, beta-blocker given her hypotension. -EF has been 25% NYHA class IV.  Coronary artery disease -On both aspirin as well as ticagrelor with DES to LAD on 08/10/2018.  COPD -Tobacco cessation.  Deconditioning -PT.  Pulmonary hypertension - Mixed.  Dr. Haroldine Laws to see tomorrow.     For questions or updates, please contact [place] Please consult [email or web address] for contact info under        Signed, Candee Furbish, MD  12/03/2018, 11:40 AM

## 2018-12-03 NOTE — Progress Notes (Signed)
PROGRESS NOTE    [name]   ASN:[id number]  DOB: 12-06-1950  DOA: 11/24/2018 PCP: [name], MD   Brief Narrative:  [name] is a 68 y.o. female with medical history significant of DM2, ichronic systolic CHF with a EF of 20%, CAD with stent to prox LAD in 2/20, CKD 3 with baseline creatinine of 2, COPD/ chronic respiratory failure on 1 to 2 L of nasal cannula only at night.  She presents with dyspnea that has not improved with oral Lasix increased from 40 to 80 mg daily 2 days prior to admission by cardiology. She was told to come into the ED for AKI in setting of fluid overload.  CXR> central venous congestion, effusions only slightly improved from prior Renal US> Mildly hyperechoic kidneys, compatible with chronic medical renal disease  SBP in low 100s Pulse ox 100% on 3 L Cr 4.38 with baseline being ~ 2 LA> 2.0 BNP 4,046 Trop > 0.03  Subjective: Constipation improving.  No nausea no vomiting.  Oral intake is still minimal.  Assessment & Plan: AKI (acute kidney injury) on CKD 3  metabolic acidosis Acute on Chronic systolic CHF (congestive heart failure) Pulmonary HTN   - AKI due to low cardiac output in setting of Lasix, Aldactone and Losartan use- all 3 being held - renal ultrasound consistent with medical renal disease - heart failure team assisting with management of CHF and Dobutamine has been started to increase cardiac output - s/p right heart cath showing moderately elevated pulmonary pressure-  - cont dobutamine Nephrology signed off as well.  Appreciate their assistance as well.  Active Problems: Hypotension/ Lactic acidosis - Coreg being held - Lactic acidosis resolved  Elevated troponin - mild elevation at 0.03 related to above AKI and heart failure-   Short 5 beat run of V tach - on 5/30- Coreg on hold currently by cardiology    Diabetes mellitus 2   - last A1c 9.3 on 3/22- poor control with nephropathy - she states she no longer takes long  acting insulin- will follow sugars int he hospital and see if we need to initiate this -  SSI ordered- sugars are well controlled - holding Glucophage  Thrush - possibly related to uncontrolled DM - started Nystatin- improving  GERD - has heart burn frequently- treat thrush which may be extending into esophagus with Nystatin swish and swallow - cont Protonix daily    CAD (coronary artery disease), native coronary artery - cont ASA and Brillinta - cont Atorvastatin    COPD (chronic obstructive pulmonary disease)  - no exacerbation noted  Asymptomatic bacteriuria.  With E. coli Patient was started on IV ceftriaxone by cardiology but the patient does not have any symptoms. We will discontinue antibiotics.  Constipation. Continue stool softener.  Time spent in minutes: 35 DVT prophylaxis: SCDs Code Status: Full code Family Communication:  Disposition Plan: f/u Cr- PT recommends HHPT  Consultants:   Heart failure team Procedures:   2 D ECHO 1. The left ventricle has severely reduced systolic function, with an ejection fraction of 25-30%. The cavity size was normal. Indeterminate diastolic filling due to E-A fusion.  2. LVEF is severely depressed at approximately 25% with hypokinesi of the mid LV; akinesis of the distal 1/3 of LV No significant change in LVEF from echo report from Feb 2020.  3. The right ventricle has normal systolic function. The cavity was normal. There is no increase in right ventricular wall thickness.  4. Left atrial size was mildly  dilated.  5. Trivial pericardial effusion is present.  6. The mitral valve is abnormal. Mild thickening of the mitral valve leaflet.  7. The aortic valve is tricuspid. Mild thickening of the aortic valve. Mild calcification of the aortic valve.  8. The interatrial septum was not assessed.  Antimicrobials:  Anti-infectives (From admission, onward)   Start     Dose/Rate Route Frequency Ordered Stop   12/01/18 1400   cefTRIAXone (ROCEPHIN) 1 g in sodium chloride 0.9 % 100 mL IVPB  Status:  Discontinued     1 g 200 mL/hr over 30 Minutes Intravenous Every 24 hours 12/01/18 1246 12/02/18 1206   12/01/18 1230  cefTRIAXone (ROCEPHIN) injection 1 g  Status:  Discontinued     1 g Intramuscular Every 24 hours 12/01/18 1227 12/01/18 1246   11/29/18 1500  cefTRIAXone (ROCEPHIN) 1 g in sodium chloride 0.9 % 100 mL IVPB  Status:  Discontinued    Note to Pharmacy:  Pharmacy to dose.   1 g 200 mL/hr over 30 Minutes Intravenous Every 24 hours 11/29/18 1416 11/29/18 1519       Objective: Vitals:   12/03/18 0816 12/03/18 1127 12/03/18 1600 12/03/18 1930  BP: 108/73 100/65 113/71 117/88  Pulse: 95 92 99 98  Resp: (!) 22 18 (!) 21 (!) 26  Temp: 98.6 F (37 C) 99.2 F (37.3 C) 98.1 F (36.7 C) 98 F (36.7 C)  TempSrc: Oral Oral Oral Oral  SpO2: 99% 96% 99% 98%  Weight:      Height:        Intake/Output Summary (Last 24 hours) at 12/03/2018 2003 Last data filed at 12/03/2018 1600 Gross per 24 hour  Intake 1318.67 ml  Output 1051 ml  Net 267.67 ml   Filed Weights   12/01/18 0300 12/02/18 0630 12/03/18 0550  Weight: 50.7 kg 50.8 kg 49.6 kg    Examination: General exam: Appears comfortable  HEENT: PERRLA, oral mucosa moist, no sclera icterus or thrush Respiratory system: Clear to auscultation. Respiratory effort normal. Cardiovascular system: S1 & S2 heard,  No murmurs  Gastrointestinal system: Abdomen soft, non-tender, nondistended. Normal bowel sounds   Central nervous system: Alert and oriented. No focal neurological deficits. Extremities: No cyanosis, clubbing or edema Skin: No rashes or ulcers Psychiatry:  Mood & affect appropriate.   Data Reviewed: I have personally reviewed following labs and imaging studies  CBC: Recent Labs  Lab 11/27/18 0936 11/27/18 0937  HGB 8.5* 8.5*  HCT 25.0* 24.2*   Basic Metabolic Panel: Recent Labs  Lab 11/29/18 0331 11/30/18 0255 12/01/18 0306 12/02/18  0319 12/03/18 0349  NA 135 137 138 137 139  K 3.8 3.5 3.6 3.4* 3.6  CL 107 108 109 110 108  CO2 16* 17* 15* 17* 19*  GLUCOSE 129* 108* 126* 150* 116*  BUN 56* 51* 49* 45* 43*  CREATININE 4.10* 4.00* 3.73* 3.61* 3.53*  CALCIUM 8.7* 8.8* 9.0 8.9 9.4  PHOS  --  3.7 3.3 3.5 3.6   GFR: Estimated Creatinine Clearance: 11.6 mL/min (A) (by C-G formula based on SCr of 3.53 mg/dL (H)). Liver Function Tests: Recent Labs  Lab 11/30/18 0255 12/01/18 0306 12/02/18 0319 12/03/18 0349  ALBUMIN 3.2* 3.3* 3.1* 3.1*   No results for input(s): LIPASE, AMYLASE in the last 168 hours. No results for input(s): AMMONIA in the last 168 hours. Coagulation Profile: No results for input(s): INR, PROTIME in the last 168 hours. Cardiac Enzymes: No results for input(s): CKTOTAL, CKMB, CKMBINDEX, TROPONINI in the last 168  hours. BNP (last 3 results) No results for input(s): PROBNP in the last 8760 hours. HbA1C: No results for input(s): HGBA1C in the last 72 hours. CBG: Recent Labs  Lab 12/02/18 1629 12/02/18 2152 12/03/18 0556 12/03/18 1124 12/03/18 1647  GLUCAP 209* 91 115* 166* 146*   Lipid Profile: No results for input(s): CHOL, HDL, LDLCALC, TRIG, CHOLHDL, LDLDIRECT in the last 72 hours. Thyroid Function Tests: No results for input(s): TSH, T4TOTAL, FREET4, T3FREE, THYROIDAB in the last 72 hours. Anemia Panel: No results for input(s): VITAMINB12, FOLATE, FERRITIN, TIBC, IRON, RETICCTPCT in the last 72 hours. Urine analysis:    Component Value Date/Time   COLORURINE YELLOW 11/28/2018 2135   APPEARANCEUR HAZY (A) 11/28/2018 2135   LABSPEC 1.008 11/28/2018 2135   PHURINE 5.0 11/28/2018 2135   GLUCOSEU NEGATIVE 11/28/2018 2135   HGBUR MODERATE (A) 11/28/2018 2135   BILIRUBINUR NEGATIVE 11/28/2018 2135   KETONESUR NEGATIVE 11/28/2018 2135   PROTEINUR NEGATIVE 11/28/2018 2135   NITRITE NEGATIVE 11/28/2018 2135   LEUKOCYTESUR LARGE (A) 11/28/2018 2135   Sepsis Labs:  @[email or web address] (procalcitonin:4,lacticidven:4) ) Recent Results (from the past 240 hour(s))  SARS Coronavirus 2 (CEPHEID - Performed in [place] hospital lab), Hosp Order     Status: None   Collection Time: 11/24/18  8:27 PM  Result Value Ref Range Status   SARS Coronavirus 2 NEGATIVE NEGATIVE Final    Comment: (NOTE) If result is NEGATIVE SARS-CoV-2 target nucleic acids are NOT DETECTED. The SARS-CoV-2 RNA is generally detectable in upper and lower  respiratory specimens during the acute phase of infection. The lowest  concentration of SARS-CoV-2 viral copies this assay can detect is 250  copies / mL. A negative result does not preclude SARS-CoV-2 infection  and should not be used as the sole basis for treatment or other  patient management decisions.  A negative result may occur with  improper specimen collection / handling, submission of specimen other  than nasopharyngeal swab, presence of viral mutation(s) within the  areas targeted by this assay, and inadequate number of viral copies  (<250 copies / mL). A negative result must be combined with clinical  observations, patient history, and epidemiological information. If result is POSITIVE SARS-CoV-2 target nucleic acids are DETECTED. The SARS-CoV-2 RNA is generally detectable in upper and lower  respiratory specimens dur ing the acute phase of infection.  Positive  results are indicative of active infection with SARS-CoV-2.  Clinical  correlation with patient history and other diagnostic information is  necessary to determine patient infection status.  Positive results do  not rule out bacterial infection or co-infection with other viruses. If result is PRESUMPTIVE POSTIVE SARS-CoV-2 nucleic acids MAY BE PRESENT.   A presumptive positive result was obtained on the submitted specimen  and confirmed on repeat testing.  While 2019 novel coronavirus  (SARS-CoV-2) nucleic acids may be present in the submitted sample  additional  confirmatory testing may be necessary for epidemiological  and / or clinical management purposes  to differentiate between  SARS-CoV-2 and other Sarbecovirus currently known to infect humans.  If clinically indicated additional testing with an alternate test  methodology [phone]) is advised. The SARS-CoV-2 RNA is generally  detectable in upper and lower respiratory sp ecimens during the acute  phase of infection. The expected result is Negative. Fact Sheet for Patients:  StrictlyIdeas.no Fact Sheet for Healthcare Providers: BankingDealers.co.za This test is not yet approved or cleared by the [place] FDA and has been authorized for detection and/or diagnosis of SARS-CoV-2 by  FDA under an Emergency Use Authorization (EUA).  This EUA will remain in effect (meaning this test can be used) for the duration of the COVID-19 declaration under Section 564(b)(1) of the Act, 21 U.S.C. section 360bbb-3(b)(1), unless the authorization is terminated or revoked sooner. Performed at [facility], [place] [address] [place], [place] [address]   Culture, Urine     Status: Abnormal   Collection Time: 11/29/18  2:55 PM  Result Value Ref Range Status   Specimen Description URINE, CLEAN CATCH  Final   Special Requests   Final    NONE Performed at [facility], [place] [address] [place], [place] [address]    Culture >=100,000 COLONIES/mL ESCHERICHIA COLI (A)  Final   Report Status 12/01/2018 FINAL  Final   Organism ID, Bacteria ESCHERICHIA COLI (A)  Final      Susceptibility   Escherichia coli - MIC*    AMPICILLIN >=32 RESISTANT Resistant     CEFAZOLIN <=4 SENSITIVE Sensitive     CEFTRIAXONE <=1 SENSITIVE Sensitive     CIPROFLOXACIN >=4 RESISTANT Resistant     GENTAMICIN <=1 SENSITIVE Sensitive     IMIPENEM <=0.25 SENSITIVE Sensitive     NITROFURANTOIN <=16 SENSITIVE Sensitive     TRIMETH/SULFA <=20 SENSITIVE Sensitive      AMPICILLIN/SULBACTAM >=32 RESISTANT Resistant     PIP/TAZO <=4 SENSITIVE Sensitive     Extended ESBL NEGATIVE Sensitive     * >=100,000 COLONIES/mL ESCHERICHIA COLI         Radiology Studies: No results found.    Scheduled Meds: . aspirin  81 mg Oral Daily  . atorvastatin  40 mg Oral q1800  . feeding supplement (GLUCERNA SHAKE)  237 mL Oral TID BM  . furosemide  40 mg Oral Daily  . heparin injection (subcutaneous)  5,000 Units Subcutaneous Q8H  . insulin aspart  0-5 Units Subcutaneous QHS  . insulin aspart  0-9 Units Subcutaneous TID WC  . nystatin  5 mL Mouth/Throat QID  . pantoprazole  40 mg Oral Daily  . potassium chloride  40 mEq Oral Once  . senna-docusate  1 tablet Oral BID  . ticagrelor  90 mg Oral BID   Continuous Infusions: . DOBUTamine 2.5 mcg/kg/min (12/03/18 0500)     LOS: 9 days      Berle Mull, MD [facility] Pager: [email or web address] Password [email or web address] 12/03/2018, 8:03 PM

## 2018-12-03 NOTE — Plan of Care (Signed)
Care plans reviewed and patient is progressing.  

## 2018-12-04 ENCOUNTER — Encounter (HOSPITAL_COMMUNITY): Payer: Self-pay | Admitting: Interventional Radiology

## 2018-12-04 ENCOUNTER — Inpatient Hospital Stay (HOSPITAL_COMMUNITY): Payer: Medicare HMO

## 2018-12-04 HISTORY — PX: IR FLUORO GUIDE CV LINE RIGHT: IMG2283

## 2018-12-04 HISTORY — PX: IR US GUIDE VASC ACCESS RIGHT: IMG2390

## 2018-12-04 LAB — RENAL FUNCTION PANEL
Albumin: 3.1 g/dL — ABNORMAL LOW (ref 3.5–5.0)
Anion gap: 12 (ref 5–15)
BUN: 38 mg/dL — ABNORMAL HIGH (ref 8–23)
CO2: 18 mmol/L — ABNORMAL LOW (ref 22–32)
Calcium: 9.4 mg/dL (ref 8.9–10.3)
Chloride: 108 mmol/L (ref 98–111)
Creatinine, Ser: 3.4 mg/dL — ABNORMAL HIGH (ref 0.44–1.00)
GFR calc Af Amer: 15 mL/min — ABNORMAL LOW (ref >60)
GFR calc non Af Amer: 13 mL/min — ABNORMAL LOW (ref >60)
Glucose, Bld: 118 mg/dL — ABNORMAL HIGH (ref 70–99)
Phosphorus: 4.4 mg/dL (ref 2.5–4.6)
Potassium: 3.1 mmol/L — ABNORMAL LOW (ref 3.5–5.1)
Sodium: 138 mmol/L (ref 135–145)

## 2018-12-04 LAB — GLUCOSE, CAPILLARY
Glucose-Capillary: 109 mg/dL — ABNORMAL HIGH (ref 70–99)
Glucose-Capillary: 158 mg/dL — ABNORMAL HIGH (ref 70–99)
Glucose-Capillary: 190 mg/dL — ABNORMAL HIGH (ref 70–99)
Glucose-Capillary: 166 mg/dL — ABNORMAL HIGH (ref 70–99)

## 2018-12-04 MED ORDER — POTASSIUM CHLORIDE 20 MEQ/15ML (10%) PO SOLN
40.0000 meq | Freq: Once | ORAL | Status: AC
  Administered 2018-12-04: 16:00:00 40 meq via ORAL
  Filled 2018-12-04: qty 30

## 2018-12-04 MED ORDER — ADULT MULTIVITAMIN W/MINERALS CH
1.0000 | ORAL_TABLET | Freq: Every day | ORAL | Status: DC
  Administered 2018-12-05: 1 via ORAL
  Filled 2018-12-04 (×2): qty 1

## 2018-12-04 MED ORDER — LIDOCAINE HCL 1 % IJ SOLN
INTRAMUSCULAR | Status: AC
  Filled 2018-12-04: qty 20

## 2018-12-04 MED ORDER — LIDOCAINE HCL (PF) 1 % IJ SOLN
INTRAMUSCULAR | Status: AC | PRN
  Administered 2018-12-04: 10 mL

## 2018-12-04 MED ORDER — NEPRO/CARBSTEADY PO LIQD
237.0000 mL | Freq: Every day | ORAL | Status: DC
  Administered 2018-12-04: 237 mL via ORAL

## 2018-12-04 MED ORDER — POTASSIUM CHLORIDE CRYS ER 20 MEQ PO TBCR: 40.0000 meq | EXTENDED_RELEASE_TABLET | Freq: Once | ORAL | Status: DC

## 2018-12-04 NOTE — TOC Initial Note (Signed)
Transition of Care (TOC) - Initial/Assessment Note  Marvetta Gibbons RN, BSN Transitions of Care Unit 4E- RN Case Manager [phone]   Patient Details  Name: [name] MRN: [id number] Date of Birth: 12-Nov-1950  Transition of Care [facility]) CM/SW Contact:    Dawayne Patricia, RN Phone Number: 12/04/2018, 2:39 PM  Clinical Narrative:                 Pt admitted AKI, HF, pt was active with [facility] for HHRN/PT- plan also now to return home with home Dobutamine infusion. Pam with [facility] following for home infusion needs and will coordinate with [facility] for [facility] needs. Dorian Pod with [facility] aware of transition needs for HF. Orders have been placed for HHHF needs.   Expected Discharge Plan: [place] Barriers to Discharge: Continued Medical Work up   Patient Goals and CMS Choice Patient states their goals for this hospitalization and ongoing recovery are:: "go get home" CMS Medicare.gov Compare Post Acute Care list provided to:: Patient Choice offered to / list presented to : Patient  Expected Discharge Plan and Services Expected Discharge Plan: [place]   Discharge Planning Services: CM Consult Post Acute Care Choice: Home Health, Resumption of Svcs/PTA Provider Living arrangements for the past 2 months: Apartment                           HH Arranged: RN, PT, Disease Management [place] Agency: [facility] [place], Other - See comment Date HH Agency Contacted: 12/04/18 Time [place]: 1112 Representative spoke with at [place]: [place] Arrangements/Services Living arrangements for the past 2 months: [place] with:: Self Patient language and need for interpreter reviewed:: Yes Do you feel safe going back to the place where you live?: Yes      Need for Family Participation in Patient Care: Yes (Comment) Care giver support system in place?: Yes (comment) Current home services: Home RN, Home PT Criminal  Activity/Legal Involvement Pertinent to Current Situation/Hospitalization: No - Comment as needed  Activities of Daily Living Home Assistive Devices/Equipment: None ADL Screening (condition at time of admission) Patient's cognitive ability adequate to safely complete daily activities?: Yes Is the patient deaf or have difficulty hearing?: No Does the patient have difficulty seeing, even when wearing glasses/contacts?: No Does the patient have difficulty concentrating, remembering, or making decisions?: No Patient able to express need for assistance with ADLs?: Yes Does the patient have difficulty dressing or bathing?: No Independently performs ADLs?: Yes (appropriate for developmental age) Does the patient have difficulty walking or climbing stairs?: Yes Weakness of Legs: Both Weakness of Arms/Hands: Both  Permission Sought/Granted Permission sought to share information with : Chartered certified accountant granted to share information with : Yes, Verbal Permission Granted     Permission granted to share info w AGENCY: [facility]        Emotional Assessment Appearance:: Appears stated age Attitude/Demeanor/Rapport: Engaged Affect (typically observed): Appropriate Orientation: : Oriented to Situation, Oriented to Self, Oriented to Place, Oriented to  Time   Psych Involvement: No (comment)  Admission diagnosis:  AKI (acute kidney injury) ([place]) [N17.9] Patient Active Problem List   Diagnosis Date Noted  . COPD (chronic obstructive pulmonary disease) ([place]) 11/25/2018  . Pulmonary HTN ([place]) 11/25/2018  . AKI (acute kidney injury) ([place]) 11/24/2018  . Chronic systolic CHF (congestive heart failure) ([place]) 11/24/2018  . Malnutrition of moderate degree 09/19/2018  . Acute on chronic  systolic (congestive) heart failure ([place]) 09/16/2018  . Acute on chronic respiratory failure with hypoxia ([place]) 09/16/2018  . CKD (chronic kidney disease), stage III ([place]) 09/16/2018  .  CAD (coronary artery disease), native coronary artery 09/16/2018  . NSTEMI (non-ST elevated myocardial infarction) ([place]) 08/06/2018  . Hypokalemia 08/06/2018  . Diabetes mellitus ([place]) 08/06/2018  . NSTEMI, initial episode of care ([place]) 08/06/2018   PCP:  [name], MD Pharmacy:   [facility] [place], [place] [address] [place] [address] Phone: [phone] Fax: [place] [facility] [place], [place] [address] Phone: [phone] Fax: [phone]     Social Determinants of Health (SDOH) Interventions    Readmission Risk Interventions No flowsheet data found.

## 2018-12-04 NOTE — Consult Note (Signed)
   Patient Status: [facility] - In-pt  Assessment and Plan: Patient in need of venous access due to acute/chronic systolic heart failure She has plans for home dobutamine therapy.  Discussed catheter placement with patient.   Risks and benefits discussed with the patient including, but not limited to bleeding, infection, vascular injury, pneumothorax which may require chest tube placement, air embolism or even death  All of the patient's questions were answered, patient is agreeable to proceed. Consent signed and in chart.  ______________________________________________________________________   History of Present Illness: [name] is a 68 y.o. female with acute on chronic systolic heart failure as well as stage 4 renal disease.  Request is made for tunneled central venous catheter placement for home infusion.   Allergies and medications reviewed.   Review of Systems: A 12 point ROS discussed and pertinent positives are indicated in the HPI above.  All other systems are negative.  Review of Systems  Constitutional: Negative for fatigue and fever.  Respiratory: Negative for cough and shortness of breath.   Cardiovascular: Negative for chest pain.  Gastrointestinal: Negative for abdominal pain.  Musculoskeletal: Negative for back pain.  Psychiatric/Behavioral: Negative for behavioral problems and confusion.    Vital Signs: BP 111/77 (BP Location: Right Arm)   Pulse 95   Temp 97.7 F (36.5 C) (Oral)   Resp 20   Ht 5\' 2"  (1.575 m)   Wt 110 lb 14.3 oz (50.3 kg)   SpO2 98%   BMI 20.28 kg/m   Physical Exam Vitals signs and nursing note reviewed.  Constitutional:      Appearance: Normal appearance. She is normal weight.  HENT:     Mouth/Throat:     Mouth: Mucous membranes are moist.     Pharynx: Oropharynx is clear.  Neck:     Musculoskeletal: Normal range of motion and neck supple.  Cardiovascular:     Rate and Rhythm: Normal rate and regular rhythm.  Pulmonary:   Effort: Pulmonary effort is normal. No respiratory distress.     Breath sounds: Normal breath sounds.  Skin:    General: Skin is warm and dry.  Neurological:     General: No focal deficit present.     Mental Status: She is alert and oriented to person, place, and time. Mental status is at baseline.  Psychiatric:        Mood and Affect: Mood normal.        Behavior: Behavior normal.        Thought Content: Thought content normal.        Judgment: Judgment normal.      Imaging reviewed.   Labs:  COAGS: No results for input(s): INR, APTT in the last 8760 hours.  BMP: Recent Labs    12/01/18 0306 12/02/18 0319 12/03/18 0349 12/04/18 0401  NA 138 137 139 138  K 3.6 3.4* 3.6 3.1*  CL 109 110 108 108  CO2 15* 17* 19* 18*  GLUCOSE 126* 150* 116* 118*  BUN 49* 45* 43* 38*  CALCIUM 9.0 8.9 9.4 9.4  CREATININE 3.73* 3.61* 3.53* 3.40*  GFRNONAA 12* 12* 12* 13*  GFRAA 13* 14* 14* 15*       Electronically Signed: Docia Barrier, PA 12/04/2018, 1:20 PM   I spent a total of 15 minutes in face to face in clinical consultation, greater than 50% of which was counseling/coordinating care for venous access.

## 2018-12-04 NOTE — Procedures (Signed)
CHF  S/P RT IJ TUNNELED SL POWER PICC  Tip svcra No comp Ready for use Full report in pacs

## 2018-12-04 NOTE — Care Management Important Message (Signed)
Important Message  Patient Details  Name: Stina Gane MRN: [id number] Date of Birth: [birth year]   Medicare Important Message Given:  Yes    [place], Evelyn 12/04/2018, 12:09 PM

## 2018-12-04 NOTE — Progress Notes (Signed)
PROGRESS NOTE    [name]   YIA:[id number]  DOB: [birth year]  DOA: 11/24/2018 PCP: [name], MD   Brief Narrative:  [name] is a 68 y.o. female with medical history significant of DM2, ichronic systolic CHF with a EF of 20%, CAD with stent to prox LAD in 2/20, CKD 3 with baseline creatinine of 2, COPD/ chronic respiratory failure on 1 to 2 L of nasal cannula only at night.  She presents with dyspnea that has not improved with oral Lasix increased from 40 to 80 mg daily 2 days prior to admission by cardiology. She was told to come into the ED for AKI in setting of fluid overload.  CXR> central venous congestion, effusions only slightly improved from prior Renal US> Mildly hyperechoic kidneys, compatible with chronic medical renal disease  SBP in low 100s Pulse ox 100% on 3 L Cr 4.38 with baseline being ~ 2 LA> 2.0 BNP 4,046 Trop > 0.03  Subjective: No acute complaint no nausea no vomiting no fever no chills.  Breathing better.  Constipation resolved.  Assessment & Plan: AKI (acute kidney injury) on CKD 3  metabolic acidosis Acute on Chronic systolic CHF (congestive heart failure) Pulmonary HTN   - AKI due to low cardiac output in setting of Lasix, Aldactone and Losartan use- all 3 being held - renal ultrasound consistent with medical renal disease - heart failure team assisting with management of CHF and Dobutamine has been started to increase cardiac output - s/p right heart cath showing moderately elevated pulmonary pressure-  - cont dobutamine Nephrology signed off as well.  Appreciate their assistance as well. -Patient underwent PICC line placement.  Plan is to go home on dobutamine therapy.  Hypokalemia. Replaced.  Hypotension/ Lactic acidosis - Coreg being held - Lactic acidosis resolved  Elevated troponin - mild elevation at 0.03 related to above AKI and heart failure-   Short 5 beat run of V tach - on 5/30- Coreg on hold currently by  cardiology  Diabetes mellitus 2   - last A1c 9.3 on 3/22- poor control with nephropathy - she states she no longer takes long acting insulin- will follow sugars int he hospital and see if we need to initiate this -  SSI ordered- sugars are well controlled - holding Glucophage  Thrush - possibly related to uncontrolled DM - started Nystatin- improving  GERD - has heart burn frequently- treat thrush which may be extending into esophagus with Nystatin swish and swallow - cont Protonix daily    CAD (coronary artery disease), native coronary artery - cont ASA and Brillinta - cont Atorvastatin    COPD (chronic obstructive pulmonary disease)  - no exacerbation noted  Asymptomatic bacteriuria.  With E. coli Patient was started on IV ceftriaxone by cardiology but the patient does not have any symptoms. We will discontinue antibiotics.  Constipation. Continue stool softener.  Time spent in minutes: 35 DVT prophylaxis: SCDs Code Status: Full code Family Communication:  Disposition Plan: f/u Cr- PT recommends HHPT  Consultants:   Heart failure team Procedures:   2 D ECHO 1. The left ventricle has severely reduced systolic function, with an ejection fraction of 25-30%. The cavity size was normal. Indeterminate diastolic filling due to E-A fusion.  2. LVEF is severely depressed at approximately 25% with hypokinesi of the mid LV; akinesis of the distal 1/3 of LV No significant change in LVEF from echo report from Feb 2020.  3. The right ventricle has normal systolic function. The cavity was  normal. There is no increase in right ventricular wall thickness.  4. Left atrial size was mildly dilated.  5. Trivial pericardial effusion is present.  6. The mitral valve is abnormal. Mild thickening of the mitral valve leaflet.  7. The aortic valve is tricuspid. Mild thickening of the aortic valve. Mild calcification of the aortic valve.  8. The interatrial septum was not  assessed.  Antimicrobials:  Anti-infectives (From admission, onward)   Start     Dose/Rate Route Frequency Ordered Stop   12/01/18 1400  cefTRIAXone (ROCEPHIN) 1 g in sodium chloride 0.9 % 100 mL IVPB  Status:  Discontinued     1 g 200 mL/hr over 30 Minutes Intravenous Every 24 hours 12/01/18 1246 12/02/18 1206   12/01/18 1230  cefTRIAXone (ROCEPHIN) injection 1 g  Status:  Discontinued     1 g Intramuscular Every 24 hours 12/01/18 1227 12/01/18 1246   11/29/18 1500  cefTRIAXone (ROCEPHIN) 1 g in sodium chloride 0.9 % 100 mL IVPB  Status:  Discontinued    Note to Pharmacy:  Pharmacy to dose.   1 g 200 mL/hr over 30 Minutes Intravenous Every 24 hours 11/29/18 1416 11/29/18 1519       Objective: Vitals:   12/04/18 0422 12/04/18 0906 12/04/18 1300 12/04/18 1546  BP: 113/63 110/82 111/77 103/72  Pulse: 95   69  Resp: '16 20 20 15  '$ Temp: 98.6 F (37 C) 97.7 F (36.5 C)  (!) 97.5 F (36.4 C)  TempSrc: Oral Oral  Oral  SpO2: 96% 99% 98% 99%  Weight: 50.3 kg     Height:        Intake/Output Summary (Last 24 hours) at 12/04/2018 1702 Last data filed at 12/04/2018 0500 Gross per 24 hour  Intake 143.38 ml  Output --  Net 143.38 ml   Filed Weights   12/02/18 0630 12/03/18 0550 12/04/18 0422  Weight: 50.8 kg 49.6 kg 50.3 kg    Examination: General exam: Appears comfortable  HEENT: PERRLA, oral mucosa moist, no sclera icterus or thrush Respiratory system: Clear to auscultation. Respiratory effort normal. Cardiovascular system: S1 & S2 heard,  No murmurs  Gastrointestinal system: Abdomen soft, non-tender, nondistended. Normal bowel sounds   Central nervous system: Alert and oriented. No focal neurological deficits. Extremities: No cyanosis, clubbing or edema Skin: No rashes or ulcers Psychiatry:  Mood & affect appropriate.   Data Reviewed: I have personally reviewed following labs and imaging studies  CBC: No results for input(s): WBC, NEUTROABS, HGB, HCT, MCV, PLT in the last  168 hours. Basic Metabolic Panel: Recent Labs  Lab 11/30/18 0255 12/01/18 0306 12/02/18 0319 12/03/18 0349 12/04/18 0401  NA 137 138 137 139 138  K 3.5 3.6 3.4* 3.6 3.1*  CL 108 109 110 108 108  CO2 17* 15* 17* 19* 18*  GLUCOSE 108* 126* 150* 116* 118*  BUN 51* 49* 45* 43* 38*  CREATININE 4.00* 3.73* 3.61* 3.53* 3.40*  CALCIUM 8.8* 9.0 8.9 9.4 9.4  PHOS 3.7 3.3 3.5 3.6 4.4   GFR: Estimated Creatinine Clearance: 12.2 mL/min (A) (by C-G formula based on SCr of 3.4 mg/dL (H)). Liver Function Tests: Recent Labs  Lab 11/30/18 0255 12/01/18 0306 12/02/18 0319 12/03/18 0349 12/04/18 0401  ALBUMIN 3.2* 3.3* 3.1* 3.1* 3.1*   No results for input(s): LIPASE, AMYLASE in the last 168 hours. No results for input(s): AMMONIA in the last 168 hours. Coagulation Profile: No results for input(s): INR, PROTIME in the last 168 hours. Cardiac Enzymes: No results  for input(s): CKTOTAL, CKMB, CKMBINDEX, TROPONINI in the last 168 hours. BNP (last 3 results) No results for input(s): PROBNP in the last 8760 hours. HbA1C: No results for input(s): HGBA1C in the last 72 hours. CBG: Recent Labs  Lab 12/03/18 1647 12/03/18 2115 12/04/18 0606 12/04/18 1301 12/04/18 1651  GLUCAP 146* 165* 109* 158* 190*   Lipid Profile: No results for input(s): CHOL, HDL, LDLCALC, TRIG, CHOLHDL, LDLDIRECT in the last 72 hours. Thyroid Function Tests: No results for input(s): TSH, T4TOTAL, FREET4, T3FREE, THYROIDAB in the last 72 hours. Anemia Panel: No results for input(s): VITAMINB12, FOLATE, FERRITIN, TIBC, IRON, RETICCTPCT in the last 72 hours. Urine analysis:    Component Value Date/Time   COLORURINE YELLOW 11/28/2018 2135   APPEARANCEUR HAZY (A) 11/28/2018 2135   LABSPEC 1.008 11/28/2018 2135   PHURINE 5.0 11/28/2018 2135   GLUCOSEU NEGATIVE 11/28/2018 2135   HGBUR MODERATE (A) 11/28/2018 2135   BILIRUBINUR NEGATIVE 11/28/2018 2135   KETONESUR NEGATIVE 11/28/2018 2135   PROTEINUR NEGATIVE  11/28/2018 2135   NITRITE NEGATIVE 11/28/2018 2135   LEUKOCYTESUR LARGE (A) 11/28/2018 2135   Sepsis Labs: '@LABRCNTIP'$ (procalcitonin:4,lacticidven:4) ) Recent Results (from the past 240 hour(s))  SARS Coronavirus 2 (CEPHEID - Performed in [facility] hospital lab), Hosp Order     Status: None   Collection Time: 11/24/18  8:27 PM  Result Value Ref Range Status   SARS Coronavirus 2 NEGATIVE NEGATIVE Final    Comment: (NOTE) If result is NEGATIVE SARS-CoV-2 target nucleic acids are NOT DETECTED. The SARS-CoV-2 RNA is generally detectable in upper and lower  respiratory specimens during the acute phase of infection. The lowest  concentration of SARS-CoV-2 viral copies this assay can detect is 250  copies / mL. A negative result does not preclude SARS-CoV-2 infection  and should not be used as the sole basis for treatment or other  patient management decisions.  A negative result may occur with  improper specimen collection / handling, submission of specimen other  than nasopharyngeal swab, presence of viral mutation(s) within the  areas targeted by this assay, and inadequate number of viral copies  (<250 copies / mL). A negative result must be combined with clinical  observations, patient history, and epidemiological information. If result is POSITIVE SARS-CoV-2 target nucleic acids are DETECTED. The SARS-CoV-2 RNA is generally detectable in upper and lower  respiratory specimens dur ing the acute phase of infection.  Positive  results are indicative of active infection with SARS-CoV-2.  Clinical  correlation with patient history and other diagnostic information is  necessary to determine patient infection status.  Positive results do  not rule out bacterial infection or co-infection with other viruses. If result is PRESUMPTIVE POSTIVE SARS-CoV-2 nucleic acids MAY BE PRESENT.   A presumptive positive result was obtained on the submitted specimen  and confirmed on repeat testing.   While 2019 novel coronavirus  (SARS-CoV-2) nucleic acids may be present in the submitted sample  additional confirmatory testing may be necessary for epidemiological  and / or clinical management purposes  to differentiate between  SARS-CoV-2 and other Sarbecovirus currently known to infect humans.  If clinically indicated additional testing with an alternate test  methodology [phone]) is advised. The SARS-CoV-2 RNA is generally  detectable in upper and lower respiratory sp ecimens during the acute  phase of infection. The expected result is Negative. Fact Sheet for Patients:  StrictlyIdeas.no Fact Sheet for Healthcare Providers: BankingDealers.co.za This test is not yet approved or cleared by the [place] FDA and  has been authorized for detection and/or diagnosis of SARS-CoV-2 by FDA under an Emergency Use Authorization (EUA).  This EUA will remain in effect (meaning this test can be used) for the duration of the COVID-19 declaration under Section 564(b)(1) of the Act, 21 U.S.C. section 360bbb-3(b)(1), unless the authorization is terminated or revoked sooner. Performed at [facility], [place] [address] [place], [place] [address]   Culture, Urine     Status: Abnormal   Collection Time: 11/29/18  2:55 PM  Result Value Ref Range Status   Specimen Description URINE, CLEAN CATCH  Final   Special Requests   Final    NONE Performed at [facility], [place] [address], [place], [place] [address]    Culture >=100,000 COLONIES/mL ESCHERICHIA COLI (A)  Final   Report Status 12/01/2018 FINAL  Final   Organism ID, Bacteria ESCHERICHIA COLI (A)  Final      Susceptibility   Escherichia coli - MIC*    AMPICILLIN >=32 RESISTANT Resistant     CEFAZOLIN <=4 SENSITIVE Sensitive     CEFTRIAXONE <=1 SENSITIVE Sensitive     CIPROFLOXACIN >=4 RESISTANT Resistant     GENTAMICIN <=1 SENSITIVE Sensitive     IMIPENEM <=0.25 SENSITIVE Sensitive      NITROFURANTOIN <=16 SENSITIVE Sensitive     TRIMETH/SULFA <=20 SENSITIVE Sensitive     AMPICILLIN/SULBACTAM >=32 RESISTANT Resistant     PIP/TAZO <=4 SENSITIVE Sensitive     Extended ESBL NEGATIVE Sensitive     * >=100,000 COLONIES/mL ESCHERICHIA COLI         Radiology Studies: Ir Fluoro Guide Cv Line Right  Result Date: 12/04/2018 INDICATION: Chronic CHF, access for continuous IV dobutamine therapy EXAM: TUNNELED PICC LINE WITH ULTRASOUND AND FLUOROSCOPIC GUIDANCE MEDICATIONS: 1% lidocaine local. ANESTHESIA/SEDATION: Moderate Sedation Time:  None. The patient was continuously monitored during the procedure by the interventional radiology nurse under my direct supervision. FLUOROSCOPY TIME:  Fluoroscopy Time: 0 minutes 18 seconds (1 mGy). COMPLICATIONS: None immediate. PROCEDURE: Informed written consent was obtained from the patient after a discussion of the risks, benefits, and alternatives to treatment. Questions regarding the procedure were encouraged and answered. The right neck and chest were prepped with chlorhexidine in a sterile fashion, and a sterile drape was applied covering the operative field. Maximum barrier sterile technique with sterile gowns and gloves were used for the procedure. A timeout was performed prior to the initiation of the procedure. After creating a small venotomy incision, a micropuncture kit was utilized to access the right internal jugular vein under direct, real-time ultrasound guidance after the overlying soft tissues were anesthetized with 1% lidocaine with epinephrine. Ultrasound image documentation was performed. The microwire was kinked to measure appropriate catheter length. The micropuncture sheath was exchanged for a peel-away sheath over a guidewire. A 5 French single lumen tunneled PICC measuring 23 cm was tunneled in a retrograde fashion from the anterior chest wall to the venotomy incision. The catheter was then placed through the peel-away sheath  with tip ultimately positioned at the superior caval-atrial junction. Final catheter positioning was confirmed and documented with a spot radiographic image. The catheter aspirates and flushes normally. The catheter was flushed with appropriate volume heparin dwells. The catheter exit site was secured with a 0-Prolene retention suture. The venotomy incision was closed with Dermabond. Dressings were applied. The patient tolerated the procedure well without immediate post procedural complication. FINDINGS: After catheter placement, the tip lies within the superior cavoatrial junction. The catheter aspirates and flushes normally  and is ready for immediate use. IMPRESSION: Successful placement of 23cm single lumen tunneled PICC catheter via the right internal jugular vein with tip terminating at the superior caval atrial junction. The catheter is ready for immediate use. Electronically Signed   By: Jerilynn Mages.  Shick M.D.   On: 12/04/2018 15:00   Ir US Guide Vasc Access Right  Result Date: 12/04/2018 INDICATION: Chronic CHF, access for continuous IV dobutamine therapy EXAM: TUNNELED PICC LINE WITH ULTRASOUND AND FLUOROSCOPIC GUIDANCE MEDICATIONS: 1% lidocaine local. ANESTHESIA/SEDATION: Moderate Sedation Time:  None. The patient was continuously monitored during the procedure by the interventional radiology nurse under my direct supervision. FLUOROSCOPY TIME:  Fluoroscopy Time: 0 minutes 18 seconds (1 mGy). COMPLICATIONS: None immediate. PROCEDURE: Informed written consent was obtained from the patient after a discussion of the risks, benefits, and alternatives to treatment. Questions regarding the procedure were encouraged and answered. The right neck and chest were prepped with chlorhexidine in a sterile fashion, and a sterile drape was applied covering the operative field. Maximum barrier sterile technique with sterile gowns and gloves were used for the procedure. A timeout was performed prior to the initiation of the  procedure. After creating a small venotomy incision, a micropuncture kit was utilized to access the right internal jugular vein under direct, real-time ultrasound guidance after the overlying soft tissues were anesthetized with 1% lidocaine with epinephrine. Ultrasound image documentation was performed. The microwire was kinked to measure appropriate catheter length. The micropuncture sheath was exchanged for a peel-away sheath over a guidewire. A 5 French single lumen tunneled PICC measuring 23 cm was tunneled in a retrograde fashion from the anterior chest wall to the venotomy incision. The catheter was then placed through the peel-away sheath with tip ultimately positioned at the superior caval-atrial junction. Final catheter positioning was confirmed and documented with a spot radiographic image. The catheter aspirates and flushes normally. The catheter was flushed with appropriate volume heparin dwells. The catheter exit site was secured with a 0-Prolene retention suture. The venotomy incision was closed with Dermabond. Dressings were applied. The patient tolerated the procedure well without immediate post procedural complication. FINDINGS: After catheter placement, the tip lies within the superior cavoatrial junction. The catheter aspirates and flushes normally and is ready for immediate use. IMPRESSION: Successful placement of 23cm single lumen tunneled PICC catheter via the right internal jugular vein with tip terminating at the superior caval atrial junction. The catheter is ready for immediate use. Electronically Signed   By: Jerilynn Mages.  Shick M.D.   On: 12/04/2018 15:00      Scheduled Meds:  aspirin  81 mg Oral Daily   atorvastatin  40 mg Oral q1800   feeding supplement (NEPRO CARB STEADY)  237 mL Oral QHS   furosemide  40 mg Oral Daily   heparin injection (subcutaneous)  5,000 Units Subcutaneous Q8H   insulin aspart  0-5 Units Subcutaneous QHS   insulin aspart  0-9 Units Subcutaneous TID WC    lidocaine       multivitamin with minerals  1 tablet Oral Daily   nystatin  5 mL Mouth/Throat QID   pantoprazole  40 mg Oral Daily   senna-docusate  1 tablet Oral BID   ticagrelor  90 mg Oral BID   Continuous Infusions:  DOBUTamine 2.5 mcg/kg/min (12/04/18 0500)     LOS: 10 days      Berle Mull, MD [facility] Pager: [email or web address] Password [email or web address] 12/04/2018, 5:02 PM

## 2018-12-04 NOTE — Progress Notes (Addendum)
Advanced Heart Failure Rounding Note   Subjective:    RHC 6/1  RA = 7 RV = 11/7 PA = 55/18 (33) PCW = 25 (v = 35) Fick cardiac output/index = 4.0/2.7 PVR = 1.9 WU FA sat = 98% PA sat = 58%, 60%  Remains on dobutamine 2.5. Feels ok. Denies SOB,orthopnea or PND. Remains on lasix 40 daily. Weight stable at 110.   Creatinine trending down 4.8 -> 4.7 -> 4.5 -> 4.1 -> 4.0 -> 3.7 -> 3.4  Objective:   Weight Range:  Vital Signs:   Temp:  [97.7 F (36.5 C)-99.3 F (37.4 C)] 97.7 F (36.5 C) (06/08 0906) Pulse Rate:  [87-99] 95 (06/08 0422) Resp:  [11-26] 20 (06/08 0906) BP: (110-117)/(63-88) 110/82 (06/08 0906) SpO2:  [96 %-99 %] 99 % (06/08 0906) Weight:  [50.3 kg] 50.3 kg (06/08 0422) Last BM Date: 12/03/18  Weight change: Filed Weights   12/02/18 0630 12/03/18 0550 12/04/18 0422  Weight: 50.8 kg 49.6 kg 50.3 kg    Intake/Output:   Intake/Output Summary (Last 24 hours) at 12/04/2018 1216 Last data filed at 12/04/2018 0500 Gross per 24 hour  Intake 411.1 ml  Output 301 ml  Net 110.1 ml     Physical Exam: General:  Sitting in chair. Weak appearing. No resp difficulty HEENT: normal Neck: supple. no JVD. Carotids 2+ bilat; no bruits. No lymphadenopathy or thryomegaly appreciated. Cor: PMI nondisplaced. Regular rate & rhythm. No rubs, gallops or murmurs. Lungs: clear Abdomen: soft, nontender, nondistended. No hepatosplenomegaly. No bruits or masses. Good bowel sounds. Extremities: no cyanosis, clubbing, rash, edema Neuro: alert & orientedx3, cranial nerves grossly intact. moves all 4 extremities w/o difficulty. Affect pleasant    Telemetry: NSR 90s Personally reviewed   Labs: Basic Metabolic Panel: Recent Labs  Lab 11/30/18 0255 12/01/18 0306 12/02/18 0319 12/03/18 0349 12/04/18 0401  NA 137 138 137 139 138  K 3.5 3.6 3.4* 3.6 3.1*  CL 108 109 110 108 108  CO2 17* 15* 17* 19* 18*  GLUCOSE 108* 126* 150* 116* 118*  BUN 51* 49* 45* 43* 38*   CREATININE 4.00* 3.73* 3.61* 3.53* 3.40*  CALCIUM 8.8* 9.0 8.9 9.4 9.4  PHOS 3.7 3.3 3.5 3.6 4.4    Liver Function Tests: Recent Labs  Lab 11/30/18 0255 12/01/18 0306 12/02/18 0319 12/03/18 0349 12/04/18 0401  ALBUMIN 3.2* 3.3* 3.1* 3.1* 3.1*   No results for input(s): LIPASE, AMYLASE in the last 168 hours. No results for input(s): AMMONIA in the last 168 hours.  CBC: No results for input(s): WBC, NEUTROABS, HGB, HCT, MCV, PLT in the last 168 hours.  Cardiac Enzymes: No results for input(s): CKTOTAL, CKMB, CKMBINDEX, TROPONINI in the last 168 hours.  BNP: BNP (last 3 results) Recent Labs    08/06/18 0628 09/16/18 0342 11/24/18 1715  BNP 1,535.2* 1,254.7* 4,046.9*    ProBNP (last 3 results) No results for input(s): PROBNP in the last 8760 hours.    Other results:  Imaging: No results found.   Medications:     Scheduled Medications: . aspirin  81 mg Oral Daily  . atorvastatin  40 mg Oral q1800  . feeding supplement (NEPRO CARB STEADY)  237 mL Oral QHS  . furosemide  40 mg Oral Daily  . heparin injection (subcutaneous)  5,000 Units Subcutaneous Q8H  . insulin aspart  0-5 Units Subcutaneous QHS  . insulin aspart  0-9 Units Subcutaneous TID WC  . multivitamin with minerals  1 tablet Oral Daily  .  nystatin  5 mL Mouth/Throat QID  . pantoprazole  40 mg Oral Daily  . potassium chloride  40 mEq Oral Once  . senna-docusate  1 tablet Oral BID  . ticagrelor  90 mg Oral BID    Infusions: . DOBUTamine 2.5 mcg/kg/min (12/04/18 0500)    PRN Medications:     Assessment/plan:   1. AKI on CKD 4 - baseline creatinine 1.9-2.0 - creatinine 4.6 -> 4.86 -> 4.7 -> 4.5 -> 4.1 -> 4.0 -> 3.6-> 3.4 -  Renal u/s 5/29 Mildly hyperechoic kidneys, compatible with chronic medical renal Disease. - RHC 6/1 as above with moderately elevated filling pressures and moderately decreased output - Continue dobutamine at 2.5 - Volume status looks good.  - She continues to  improve slowly  with dobutamine; I think if we stop she will have progressive HF and renal failure. Will arrange for home dobutamine therapy. Place tunneled PICC and will d/w with [facility]HC  2. Acute/Chronicsystolicheart failure - Suspect mixed ICM. NICM. S/p PCI 80% LAD on 12/15. -ECHO 08/06/18 EF 20-25% - Echo 11/25/18 EF 25-30% -StableNYHAIV symptoms currently. - RHC as above - She continues to improve slowly  with dobutamine; I think if we stop she will have progressive HF and renal failure. Will arrange for home dobutamine therapy. Place tunneled PICC and will d/w with AHC - Hold other HF meds  Due to low BP and AKI   3.CAD s/pNSTEMI  - 08/10/18 LHC with DES to 80% mLAD - Continueticagrelor + ASA.Denies bleeding - No s/s ischemia  4. Pulmonary Hypertension  - Mixed pHTN. PVR 4.7. Not candidate for selective pulmonary artery vasodilators at this point.  5. COPD -She reports tobacco cessation. No change.   6. DMII -Per PCP - Hgb A1C 10.2during recent admission - Continue SSI  7. Deconditioning - PT following  8. UTI - UA dirty.  - Asymptomatic.  - Culture 100K e.coli.  - Rec'd 1 day of ceftriaxone but stopped by primary team (felt to be colonization)  9. Hypokalemia - need to supp K. She has been refusing potassium supplements. We d/w her and pharmacy  Length of Stay: 10   Glori Bickers MD 12/04/2018, 12:16 PM  Advanced Heart Failure Team Pager [phone] (M-F; [place])  Please contact [facility] for night-coverage after hours (4p -7a ) and weekends on [email or web address]

## 2018-12-04 NOTE — Progress Notes (Signed)
Physical Therapy Treatment Patient Details Name: [name] MRN: [id number] DOB: [birth year] Today's Date: 12/04/2018    History of Present Illness Pt adm with acute hypoxic respiratory failure due to acute kidney injury. PMH - heart failure, copd, cad, ckd, dm, NSTEMI    PT Comments    Patient seen for mobility progression - L great toe pain only patient complaint - feels as if she slept wrong. Otherwise making good progress. No LOB or overt instability with gait. Will follow.    Follow Up Recommendations  Home health PT     Equipment Recommendations  None recommended by PT    Recommendations for Other Services       Precautions / Restrictions Precautions Precautions: Fall Restrictions Weight Bearing Restrictions: No    Mobility  Bed Mobility Overal bed mobility: Modified Independent                Transfers Overall transfer level: Needs assistance Equipment used: 4-wheeled walker Transfers: Sit to/from Stand Sit to Stand: Supervision         General transfer comment: for line management; no LOB  Ambulation/Gait Ambulation/Gait assistance: Supervision Gait Distance (Feet): 250 Feet Assistive device: 4-wheeled walker Gait Pattern/deviations: Step-through pattern Gait velocity: decreased    General Gait Details: patient self-limiting due to L great toe pain with noted reduced weight bearing; does not request pain meds, ice, heat   Stairs             Wheelchair Mobility    Modified Rankin (Stroke Patients Only)       Balance Overall balance assessment: Needs assistance Sitting-balance support: No upper extremity supported;Feet supported Sitting balance-Leahy Scale: Good     Standing balance support: During functional activity;No upper extremity supported Standing balance-Leahy Scale: Fair                              Cognition Arousal/Alertness: Awake/alert Behavior During Therapy: WFL for tasks  assessed/performed Overall Cognitive Status: Within Functional Limits for tasks assessed                                        Exercises      General Comments        Pertinent Vitals/Pain Pain Assessment: Faces Faces Pain Scale: Hurts little more Pain Location: L great toe Pain Descriptors / Indicators: Aching;Discomfort;Guarding Pain Intervention(s): Limited activity within patient's tolerance;Monitored during session;Repositioned    Home Living                      Prior Function            PT Goals (current goals can now be found in the care plan section) Acute Rehab PT Goals Patient Stated Goal: to go home  PT Goal Formulation: With patient Time For Goal Achievement: 12/18/18 Potential to Achieve Goals: Good Progress towards PT goals: Progressing toward goals    Frequency    Min 2X/week      PT Plan Current plan remains appropriate    Co-evaluation              AM-PAC PT "6 Clicks" Mobility   Outcome Measure  Help needed turning from your back to your side while in a flat bed without using bedrails?: None Help needed moving from lying on your back to sitting on the side of a  flat bed without using bedrails?: None Help needed moving to and from a bed to a chair (including a wheelchair)?: A Little Help needed standing up from a chair using your arms (e.g., wheelchair or bedside chair)?: A Little Help needed to walk in hospital room?: A Little Help needed climbing 3-5 steps with a railing? : A Little 6 Click Score: 20    End of Session Equipment Utilized During Treatment: Gait belt Activity Tolerance: Patient tolerated treatment well Patient left: in chair;with call bell/phone within reach Nurse Communication: Mobility status PT Visit Diagnosis: Unsteadiness on feet (R26.81);Other abnormalities of gait and mobility (R26.89);Muscle weakness (generalized) (M62.81)     Time: 7342-8768 PT Time Calculation (min) (ACUTE  ONLY): 12 min  Charges:  $Gait Training: 8-22 mins                      Lanney Gins, PT, DPT Supplemental Physical Therapist 12/04/18 9:06 AM Pager: [phone] Office: [phone]

## 2018-12-04 NOTE — Progress Notes (Signed)
Nutrition Follow-up  RD working remotely.  DOCUMENTATION CODES:   Not applicable  INTERVENTION:   -D/c Glucerna Shake po TID, each supplement provides 220 kcal and 10 grams of protein -MVI with minerals daily -Nepro Shake po q HS, each supplement provides 425 kcal and 19 grams protein  NUTRITION DIAGNOSIS:   Increased nutrient needs related to chronic illness(COPD, CHF) as evidenced by estimated needs.  Ongoing  GOAL:   Patient will meet greater than or equal to 90% of their needs  Progressing  MONITOR:   PO intake, Supplement acceptance, Labs, Weight trends, I & O's  REASON FOR ASSESSMENT:   Malnutrition Screening Tool    ASSESSMENT:   68 year old female who presented to the ED on 5/29 with SOB and worsening renal function. PMH of CHF, COPD, T2DM, CKD stage III, HTN. Pt admitted with AKI in the setting of CHF exacerbation and CKD.  6/01 - right heart cath  Reviewed I/O's: + 1 L x 24 hours and -2.2 L since admission  UOP: 300 ml x 24 hours  Pt's appetite has increased over the past 3 days; noted meal completion 50-100%. She has been refusing Glucerna supplements.   Per MD notes, pt with thrush- currently on nystatin. Pt also on stool softener due to constipation- last documented DM 12/03/18.   Therapies recommending home health therapy once medically stable.   Labs reviewed: K: 3.1 (on PO supplementation), Phos WDL. CBGS: 146-166 (inpatient orders for glycemic control are 0-5 units insulin aspart q HS, 0-9 units insulin aspart TID with meals).   Diet Order:   Diet Order            Diet Heart Room service appropriate? Yes; Fluid consistency: Thin  Diet effective now              EDUCATION NEEDS:   No education needs have been identified at this time  Skin:  Skin Assessment: Reviewed RN Assessment  Last BM:  12/03/18  Height:   Ht Readings from Last 1 Encounters:  11/25/18 5\' 2"  (1.575 m)    Weight:   Wt Readings from Last 1 Encounters:   12/04/18 50.3 kg    Ideal Body Weight:  50 kg  BMI:  Body mass index is 20.28 kg/m.  Estimated Nutritional Needs:   Kcal:  1450-1650  Protein:  65-80 grams  Fluid:  >/= 1.5 L    Shruti Arrey A. Jimmye Norman, RD, LDN, [place] Registered Dietitian II Certified Diabetes Care and Education Specialist Pager: [phone] After hours Pager: [phone]

## 2018-12-05 DIAGNOSIS — J410 Simple chronic bronchitis: Secondary | ICD-10-CM

## 2018-12-05 LAB — RENAL FUNCTION PANEL
Albumin: 2.8 g/dL — ABNORMAL LOW (ref 3.5–5.0)
Anion gap: 11 (ref 5–15)
BUN: 43 mg/dL — ABNORMAL HIGH (ref 8–23)
CO2: 19 mmol/L — ABNORMAL LOW (ref 22–32)
Calcium: 9.2 mg/dL (ref 8.9–10.3)
Chloride: 106 mmol/L (ref 98–111)
Creatinine, Ser: 3.54 mg/dL — ABNORMAL HIGH (ref 0.44–1.00)
GFR calc Af Amer: 14 mL/min — ABNORMAL LOW (ref >60)
GFR calc non Af Amer: 12 mL/min — ABNORMAL LOW (ref >60)
Glucose, Bld: 247 mg/dL — ABNORMAL HIGH (ref 70–99)
Phosphorus: 3.3 mg/dL (ref 2.5–4.6)
Potassium: 4 mmol/L (ref 3.5–5.1)
Sodium: 136 mmol/L (ref 135–145)

## 2018-12-05 LAB — GLUCOSE, CAPILLARY
Glucose-Capillary: 168 mg/dL — ABNORMAL HIGH (ref 70–99)
Glucose-Capillary: 142 mg/dL — ABNORMAL HIGH (ref 70–99)
Glucose-Capillary: 128 mg/dL — ABNORMAL HIGH (ref 70–99)

## 2018-12-05 MED ORDER — NYSTATIN 100000 UNIT/ML MT SUSP: 5.0000 mL | Freq: Four times a day (QID) | OROMUCOSAL | 0 refills | Status: DC

## 2018-12-05 MED ORDER — NEPRO/CARBSTEADY PO LIQD: 237.0000 mL | Freq: Two times a day (BID) | ORAL | 0 refills | Status: DC

## 2018-12-05 MED ORDER — DOCUSATE SODIUM 100 MG PO CAPS: 100.0000 mg | ORAL_CAPSULE | Freq: Two times a day (BID) | ORAL | 0 refills | Status: AC

## 2018-12-05 MED ORDER — DOCUSATE SODIUM 100 MG PO CAPS: 100.0000 mg | ORAL_CAPSULE | Freq: Two times a day (BID) | ORAL | 0 refills | Status: DC

## 2018-12-05 MED ORDER — POTASSIUM CHLORIDE ER 10 MEQ PO TBCR: 20.0000 meq | EXTENDED_RELEASE_TABLET | ORAL | 0 refills | Status: DC

## 2018-12-05 MED ORDER — FUROSEMIDE 40 MG PO TABS: 40.0000 mg | ORAL_TABLET | ORAL | 0 refills | Status: DC

## 2018-12-05 MED ORDER — FUROSEMIDE 40 MG PO TABS: 40.0000 mg | ORAL_TABLET | Freq: Every day | ORAL | 0 refills | Status: DC

## 2018-12-05 NOTE — TOC Transition Note (Signed)
Transition of Care [facility]) - CM/SW Discharge Note Marvetta Gibbons RN, BSN Transitions of Care Unit 4E- RN Case Manager [phone]   Patient Details  Name: Zhuri Krass MRN: [id number] Date of Birth: March 06, 1951  Transition of Care [facility]) CM/SW Contact:  Dawayne Patricia, RN Phone Number: 12/05/2018, 2:59 PM   Clinical Narrative:    Pt stable for transition home today, Pam with [facility] has come to bedside to place home IV dobutamine pump to pt via home pump. HHHF orders are in place- [facility] and [facility] to follow pt in home Dorian Pod with [facility] aware of pt's transition home today.    Final next level of care: [place] Barriers to Discharge: No Barriers Identified   Patient Goals and CMS Choice Patient states their goals for this hospitalization and ongoing recovery are:: "go get home" CMS Medicare.gov Compare Post Acute Care list provided to:: Patient Choice offered to / list presented to : Patient  Discharge Placement  Home with [facility]                     Discharge Plan and Services   Discharge Planning Services: CM Consult Post Acute Care Choice: Home Health, Resumption of Svcs/PTA Provider          DME Arranged: N/A DME Agency: NA       HH Arranged: RN, PT, Disease Management [place] Agency: [facility] [place], Other - See comment Date HH Agency Contacted: 12/04/18 Time [place]: 1112 Representative spoke with at [place]: [place] ([place]) Interventions     Readmission Risk Interventions Readmission Risk Prevention Plan 12/05/2018  Transportation Screening Complete  PCP or Specialist Appt within 3-5 Days Complete  HRI or [place] Complete  Social Work Consult for [place] Planning/Counseling Complete  Palliative Care Screening Not Applicable  Medication Review Press photographer) Complete

## 2018-12-05 NOTE — Progress Notes (Signed)
Advanced Heart Failure Rounding Note   Subjective:    Feels good. Tunneled picc placed yesterday. Denies SOB, orthopnea or PND. Anxious to go home.    Creatinine stable    Objective:   Weight Range:  Vital Signs:   Temp:  [97.5 F (36.4 C)-98.5 F (36.9 C)] 98.4 F (36.9 C) (06/09 0801) Pulse Rate:  [69-112] 99 (06/09 0801) Resp:  [15-20] 19 (06/09 0801) BP: (89-103)/(54-75) 93/75 (06/09 0801) SpO2:  [95 %-99 %] 99 % (06/09 0801) Weight:  [50.5 kg] 50.5 kg (06/09 0556) Last BM Date: 12/03/18  Weight change: Filed Weights   12/03/18 0550 12/04/18 0422 12/05/18 0556  Weight: 49.6 kg 50.3 kg 50.5 kg    Intake/Output:   Intake/Output Summary (Last 24 hours) at 12/05/2018 1348 Last data filed at 12/04/2018 2158 Gross per 24 hour  Intake --  Output 900 ml  Net -900 ml     Physical Exam: General:  Sitting up in bed. No resp difficulty HEENT: normal Neck: supple. no JVD.  RIJ PICC Carotids 2+ bilat; no bruits. No lymphadenopathy or thryomegaly appreciated. Cor: PMI nondisplaced. Regular rate & rhythm. No rubs, gallops or murmurs. Lungs: clear Abdomen: soft, nontender, nondistended. No hepatosplenomegaly. No bruits or masses. Good bowel sounds. Extremities: no cyanosis, clubbing, rash, edema Neuro: alert & orientedx3, cranial nerves grossly intact. moves all 4 extremities w/o difficulty. Affect pleasant   Telemetry: NSR 80-90s Personally reviewed   Labs: Basic Metabolic Panel: Recent Labs  Lab 12/01/18 0306 12/02/18 0319 12/03/18 0349 12/04/18 0401 12/05/18 0249  NA 138 137 139 138 136  K 3.6 3.4* 3.6 3.1* 4.0  CL 109 110 108 108 106  CO2 15* 17* 19* 18* 19*  GLUCOSE 126* 150* 116* 118* 247*  BUN 49* 45* 43* 38* 43*  CREATININE 3.73* 3.61* 3.53* 3.40* 3.54*  CALCIUM 9.0 8.9 9.4 9.4 9.2  PHOS 3.3 3.5 3.6 4.4 3.3    Liver Function Tests: Recent Labs  Lab 12/01/18 0306 12/02/18 0319 12/03/18 0349 12/04/18 0401 12/05/18 0249  ALBUMIN 3.3* 3.1*  3.1* 3.1* 2.8*   No results for input(s): LIPASE, AMYLASE in the last 168 hours. No results for input(s): AMMONIA in the last 168 hours.  CBC: No results for input(s): WBC, NEUTROABS, HGB, HCT, MCV, PLT in the last 168 hours.  Cardiac Enzymes: No results for input(s): CKTOTAL, CKMB, CKMBINDEX, TROPONINI in the last 168 hours.  BNP: BNP (last 3 results) Recent Labs    08/06/18 0628 09/16/18 0342 11/24/18 1715  BNP 1,535.2* 1,254.7* 4,046.9*    ProBNP (last 3 results) No results for input(s): PROBNP in the last 8760 hours.    Other results:  Imaging: Ir Fluoro Guide Cv Line Right  Result Date: 12/04/2018 INDICATION: Chronic CHF, access for continuous IV dobutamine therapy EXAM: TUNNELED PICC LINE WITH ULTRASOUND AND FLUOROSCOPIC GUIDANCE MEDICATIONS: 1% lidocaine local. ANESTHESIA/SEDATION: Moderate Sedation Time:  None. The patient was continuously monitored during the procedure by the interventional radiology nurse under my direct supervision. FLUOROSCOPY TIME:  Fluoroscopy Time: 0 minutes 18 seconds (1 mGy). COMPLICATIONS: None immediate. PROCEDURE: Informed written consent was obtained from the patient after a discussion of the risks, benefits, and alternatives to treatment. Questions regarding the procedure were encouraged and answered. The right neck and chest were prepped with chlorhexidine in a sterile fashion, and a sterile drape was applied covering the operative field. Maximum barrier sterile technique with sterile gowns and gloves were used for the procedure. A timeout was performed prior to the  initiation of the procedure. After creating a small venotomy incision, a micropuncture kit was utilized to access the right internal jugular vein under direct, real-time ultrasound guidance after the overlying soft tissues were anesthetized with 1% lidocaine with epinephrine. Ultrasound image documentation was performed. The microwire was kinked to measure appropriate catheter length.  The micropuncture sheath was exchanged for a peel-away sheath over a guidewire. A 5 French single lumen tunneled PICC measuring 23 cm was tunneled in a retrograde fashion from the anterior chest wall to the venotomy incision. The catheter was then placed through the peel-away sheath with tip ultimately positioned at the superior caval-atrial junction. Final catheter positioning was confirmed and documented with a spot radiographic image. The catheter aspirates and flushes normally. The catheter was flushed with appropriate volume heparin dwells. The catheter exit site was secured with a 0-Prolene retention suture. The venotomy incision was closed with Dermabond. Dressings were applied. The patient tolerated the procedure well without immediate post procedural complication. FINDINGS: After catheter placement, the tip lies within the superior cavoatrial junction. The catheter aspirates and flushes normally and is ready for immediate use. IMPRESSION: Successful placement of 23cm single lumen tunneled PICC catheter via the right internal jugular vein with tip terminating at the superior caval atrial junction. The catheter is ready for immediate use. Electronically Signed   By: Jerilynn Mages.  Shick M.D.   On: 12/04/2018 15:00   Ir US Guide Vasc Access Right  Result Date: 12/04/2018 INDICATION: Chronic CHF, access for continuous IV dobutamine therapy EXAM: TUNNELED PICC LINE WITH ULTRASOUND AND FLUOROSCOPIC GUIDANCE MEDICATIONS: 1% lidocaine local. ANESTHESIA/SEDATION: Moderate Sedation Time:  None. The patient was continuously monitored during the procedure by the interventional radiology nurse under my direct supervision. FLUOROSCOPY TIME:  Fluoroscopy Time: 0 minutes 18 seconds (1 mGy). COMPLICATIONS: None immediate. PROCEDURE: Informed written consent was obtained from the patient after a discussion of the risks, benefits, and alternatives to treatment. Questions regarding the procedure were encouraged and answered. The right  neck and chest were prepped with chlorhexidine in a sterile fashion, and a sterile drape was applied covering the operative field. Maximum barrier sterile technique with sterile gowns and gloves were used for the procedure. A timeout was performed prior to the initiation of the procedure. After creating a small venotomy incision, a micropuncture kit was utilized to access the right internal jugular vein under direct, real-time ultrasound guidance after the overlying soft tissues were anesthetized with 1% lidocaine with epinephrine. Ultrasound image documentation was performed. The microwire was kinked to measure appropriate catheter length. The micropuncture sheath was exchanged for a peel-away sheath over a guidewire. A 5 French single lumen tunneled PICC measuring 23 cm was tunneled in a retrograde fashion from the anterior chest wall to the venotomy incision. The catheter was then placed through the peel-away sheath with tip ultimately positioned at the superior caval-atrial junction. Final catheter positioning was confirmed and documented with a spot radiographic image. The catheter aspirates and flushes normally. The catheter was flushed with appropriate volume heparin dwells. The catheter exit site was secured with a 0-Prolene retention suture. The venotomy incision was closed with Dermabond. Dressings were applied. The patient tolerated the procedure well without immediate post procedural complication. FINDINGS: After catheter placement, the tip lies within the superior cavoatrial junction. The catheter aspirates and flushes normally and is ready for immediate use. IMPRESSION: Successful placement of 23cm single lumen tunneled PICC catheter via the right internal jugular vein with tip terminating at the superior caval atrial junction. The catheter  is ready for immediate use. Electronically Signed   By: Jerilynn Mages.  Shick M.D.   On: 12/04/2018 15:00     Medications:     Scheduled Medications:  aspirin  81 mg  Oral Daily   atorvastatin  40 mg Oral q1800   feeding supplement (NEPRO CARB STEADY)  237 mL Oral QHS   furosemide  40 mg Oral Daily   heparin injection (subcutaneous)  5,000 Units Subcutaneous Q8H   insulin aspart  0-5 Units Subcutaneous QHS   insulin aspart  0-9 Units Subcutaneous TID WC   multivitamin with minerals  1 tablet Oral Daily   nystatin  5 mL Mouth/Throat QID   pantoprazole  40 mg Oral Daily   senna-docusate  1 tablet Oral BID   ticagrelor  90 mg Oral BID    Infusions:  DOBUTamine 2.5 mcg/kg/min (12/04/18 0500)    PRN Medications:     Assessment/plan:   1. AKI on CKD 4 - baseline creatinine 1.9-2.0 - creatinine 4.6 -> 4.86 -> 4.7 -> 4.5 -> 4.1 -> 4.0 -> 3.6-> 3.4 -> 3.5 -  Renal u/s 5/29 Mildly hyperechoic kidneys, compatible with chronic medical renal Disease. - RHC 6/1 as above with moderately elevated filling pressures and moderately decreased output - Continue dobutamine at 2.5 - Volume status looks good.  - She continues to improve slowly  with dobutamine; plan will be to send her home on dobutamine today. I have d/w AHC and HHRN is set up.    2. Acute/Chronicsystolicheart failure - Suspect mixed ICM. NICM. S/p PCI 80% LAD on 12/15. -ECHO 08/06/18 EF 20-25% - Echo 11/25/18 EF 25-30% -StableNYHAIV symptoms currently. - RHC as above - She continues to improve slowly with dobutamine; plan will be to send her home on dobutamine today. I have d/w AHC and HHRN is set up.   [place] for d/c today. Meds for home  Dobutamine 2.5 mcg/kg/min Lasix 40 mg M/W/F + PRN Kdur 20 meq when taking lasix only [place] 81 Atorva  40 daily Brilinta 90 bid  Stop: spiro, carvedilol and losartan  Will f/u in HF Clinic next week. [place] HHRN set up,   3.CAD s/pNSTEMI  - 08/10/18 LHC with DES to 80% mLAD - Continueticagrelor + ASA.Denies bleeding - No s/s ischemia   Length of Stay: 11   Virgil Lightner MD 12/05/2018, 1:48 PM  Advanced Heart  Failure Team Pager [phone] (M-F; 7a - 4p)  Please contact [facility] for night-coverage after hours (4p -7a ) and weekends on [email or web address]

## 2018-12-05 NOTE — Consult Note (Signed)
   [facility] CM Inpatient Consult   12/05/2018  [name] 26-Sep-1950 [id number]  High risk score: 27%  Patient reviewed for high risk score for unplanned readmission.   Patient evaluated for [place] Management services.  Patient's Primary Care Provider is not a [facility] primary care provider or is not [facility] affiliated.   Patient with [facility].  Reason:  Primary Care Provider, [name], MD is not a part of [place].  For questions, please contact:  Natividad Brood, RN BSN [facility] Liaison  [phone] business mobile phone Toll free office [phone]  Fax number: [phone] [email or web address]

## 2018-12-05 NOTE — Progress Notes (Signed)
12/05/2018 1615 Discharge AVS meds taken today and those due this evening reviewed.  Follow-up appointments and when to call md reviewed.  D/C IV and TELE.  Questions and concerns addressed.   D/C home per orders.  Pt is connected to her home dobutamine infusion per HH. [place]

## 2018-12-08 NOTE — Discharge Summary (Signed)
[facility] Discharge Summary   Patient: [name] ZOX:[id number]   PCP: [name], MD DOB: [birth year]   Date of admission: 11/24/2018   Date of discharge: 12/05/2018     Discharge Diagnoses:  Principal Problem:   AKI (acute kidney injury) ([place]) Active Problems:   Diabetes mellitus ([place])   CKD (chronic kidney disease), stage III ([place])   CAD (coronary artery disease), native coronary artery   Chronic systolic CHF (congestive heart failure) ([place])   COPD (chronic obstructive pulmonary disease) ([place])   Pulmonary HTN ([place])   Admitted From: home Disposition:  Home with home health  Recommendations for Outpatient Follow-up:  1. Please follow up with PCP and cardiology as well as nephrology as recommended    [place] [place] Follow up.   Specialty: [place] Why: HHRN/PT, home dobutamine infusion with coordination by [facility] for infusion needs Contact information: [address] [place] [address] [phone]        [facility] Follow up on 12/11/2018.   Specialty: Cardiology Why: at  Contact information: [address] [id number] [place]       [name], MD. Schedule an appointment as soon as possible for a visit in 1 week(s).   Specialty: Family Medicine Contact information: [place] [address] [phone]          Diet recommendation: renal diet  Activity: The patient is advised to gradually reintroduce usual activities,as tolerated .  Discharge Condition: good  Code Status: full code  History of present illness: As per the H and P dictated on admission, "[name] is a 68 y.o. female with medical history significant of chronic systolic congestive heart failure with a EF of 20%, chronic kidney disease with baseline creatinine of 2, COPD, chronic respiratory failure  on 1 to 2 L of nasal cannula only at night.  Patient is been having increased shortness of breath for the last several days recently was started on Lasix 80 mg daily by cardiology service.  She denies any fevers or cough.  She denies any chest pain.  Patient reports she did not diurese a whole lot with 80 mg in the last 2 days.  She had labs repeated and noted to be have worsening renal failure so was therefore sent to the emergency department for IV fluids by cardiology service.  Patient be referred for admission for acute kidney injury in the setting of systolic congestive heart failure exacerbation recently and chronic kidney disease."  Hospital Course:  Summary of her active problems in the hospital is as following. AKI (acute kidney injury) on CKD 3  Metabolic acidosis Acute on Chronic systolic CHF (congestive heart failure) Pulmonary HTN   - AKI due to low cardiac output in setting of Lasix, Aldactone and Losartan use- all 3 being held - renal ultrasound consistent with medical renal disease - heart failure team assisting with management of CHF and Dobutamine has been started to increase cardiac output - s/p right heart cath showing moderately elevated pulmonary pressure-  - cont dobutamine Nephrology signed off as well.  Appreciate their assistance as well. -Patient underwent PICC line placement.  Plan is to go home on dobutamine therapy.  Hypokalemia. Replaced.  Hypotension/ Lactic acidosis - Coreg being held - Lactic acidosis resolved  Elevated troponin - mild elevation at 0.03 related to above AKI and heart failure-  Short 5 beat run of V tach - on 5/30- Coreg on hold currently by cardiology  Diabetes mellitus 2   - last A1c 9.3 on 3/22- poor control with nephropathy - she states she no longer takes long acting insulin - holding Glucophage  Thrush - possibly related to uncontrolled DM - started Nystatin- improving  GERD - has heart burn frequently- treat thrush  which may be extending into esophagus with Nystatin swish and swallow - cont Protonix daily    CAD (coronary artery disease), native coronary artery - cont ASA and Brillinta - cont Atorvastatin    COPD (chronic obstructive pulmonary disease)  - no exacerbation noted  Asymptomatic bacteriuria.  With E. coli Patient was started on IV ceftriaxone by cardiology but the patient does not have any symptoms. We will discontinue antibiotics.  Constipation. Continue stool softener.  Patient was Home health, which was arranged by case Freight forwarder. On the day of the discharge the patient's vitals were stable , and no other acute medical condition were reported by patient. the patient was felt safe to be discharge at Home with home health.  Consultants: cardiology, nephrology  Procedures: PICC Line  DISCHARGE MEDICATION: Allergies as of 12/05/2018   No Known Allergies     Medication List    STOP taking these medications   carvedilol 3.125 MG tablet Commonly known as: COREG   losartan 25 MG tablet Commonly known as: COZAAR   megestrol 40 MG/ML suspension Commonly known as: MEGACE   metFORMIN 1000 MG tablet Commonly known as: GLUCOPHAGE   multivitamin with minerals Tabs tablet   spironolactone 25 MG tablet Commonly known as: ALDACTONE     TAKE these medications   aspirin 81 MG chewable tablet Chew 1 tablet (81 mg total) by mouth daily. Notes to patient: Take tomorrow AM 12/06/2018   atorvastatin 40 MG tablet Commonly known as: LIPITOR Take 1 tablet (40 mg total) by mouth daily at 6 PM.   docusate sodium 100 MG capsule Commonly known as: Colace Take 1 capsule (100 mg total) by mouth 2 (two) times daily.   feeding supplement (NEPRO CARB STEADY) Liqd Take 237 mLs by mouth 2 (two) times daily between meals. Notes to patient: Take tomorrow AM 12/06/2018   furosemide 40 MG tablet Commonly known as: LASIX Take 1 tablet (40 mg total) by mouth as directed. Lasix 40 mg  Monday, Wednesday and Friday. Also take 1 tab as needed for weight gain of 3lbs in 1 day or 5lbs in 2 days What changed:   when to take this  reasons to take this  additional instructions Notes to patient: Take as needed for weight gain   Magnesium Oxide 400 (240 Mg) MG Tabs Take 1 tablet (400 mg total) by mouth daily. Notes to patient: Take tomorrow AM 12/06/2018   nystatin 100000 UNIT/ML suspension Commonly known as: MYCOSTATIN Use as directed 5 mLs (500,000 Units total) in the mouth or throat 4 (four) times daily for 7 days.   pantoprazole 40 MG tablet Commonly known as: PROTONIX Take 1 tablet (40 mg total) by mouth daily. Notes to patient: Take tomorrow AM 12/06/2018   potassium chloride 10 MEQ tablet Commonly known as: K-DUR Take 2 tablets (20 mEq total) by mouth 3 (three) times a week. With lasix. Notes to patient: Take as needed with lasix   ticagrelor 90 MG Tabs tablet Commonly known as: BRILINTA Take 1 tablet (90 mg total) by mouth 2 (two) times daily.      No Known Allergies  Discharge Instructions    Diet - low sodium heart healthy   Complete by: As directed    Discharge instructions   Complete by: As directed    It is important that you read the given instructions as well as go over your medication list with RN to help you understand your care after this hospitalization.  Discharge Instructions: Please follow-up with PCP in 1-2 weeks  Please request your primary care physician to go over all Hospital Tests and Procedure/Radiological results at the follow up. Please get all Hospital records sent to your PCP by signing hospital release before you go home.   Do not take more than prescribed Pain, Sleep and Anxiety Medications. You were cared for by a hospitalist during your hospital stay. If you have any questions about your discharge medications or the care you received while you were in the hospital after you are discharged, you can call the unit _0 @ you  were admitted to and ask to speak with the hospitalist on call if the hospitalist that took care of you is not available.  Once you are discharged, your primary care physician will handle any further medical issues. Please note that NO REFILLS for any discharge medications will be authorized once you are discharged, as it is imperative that you return to your primary care physician (or establish a relationship with a primary care physician if you do not have one) for your aftercare needs so that they can reassess your need for medications and monitor your lab values. You Must read complete instructions/literature along with all the possible adverse reactions/side effects for all the Medicines you take and that have been prescribed to you. Take any new Medicines after you have completely understood and accept all the possible adverse reactions/side effects. Wear Seat belts while driving. If you have smoked or chewed Tobacco in the last 2 yrs please stop smoking and/or stop any Recreational drug use.   Increase activity slowly   Complete by: As directed      Discharge Exam: Filed Weights   12/03/18 0550 12/04/18 0422 12/05/18 0556  Weight: 49.6 kg 50.3 kg 50.5 kg   Vitals:   12/05/18 0437 12/05/18 0801  BP: (!) 96/54 93/75  Pulse: (!) 112 99  Resp: 20 19  Temp: 98.5 F (36.9 C) 98.4 F (36.9 C)  SpO2: 96% 99%   General: Appear in no distress, no Rash; Oral Mucosa Clear, moist. no Abnormal Mass Or lumps Cardiovascular: S1 and S2 Present, aortic systolic Murmur, Respiratory: normal respiratory effort, Bilateral Air entry present and Clear to Auscultation, no Crackles, no wheezes Abdomen: Bowel Sound present, Soft and no tenderness, no hernia Extremities: no Pedal edema, no calf tenderness Neurology: alert and oriented to time, place, and person affect appropriate. normal without focal findings, mental status, speech normal, alert and oriented x3, PERLA, Motor strength 5/5 and symmetric and  sensation grossly normal to light touch   The results of significant diagnostics from this hospitalization (including imaging, microbiology, ancillary and laboratory) are listed below for reference.    Significant Diagnostic Studies: Dg Chest 2 View  Result Date: 11/24/2018 CLINICAL DATA:  Pt reports she had a televisit with MD today who advised her to come to ED. Pt here for eval of fluid retention (has not been taking lasix as directed), pt also reports decrease in urination with some shortness of breath. Pt had .*comment was truncated*sob EXAM: CHEST - 2 VIEW COMPARISON:  Radiograph 11/21/2018 FINDINGS: Normal cardiac silhouette. Some improvement in the  bilateral effusions. Persistent effusion on the RIGHT. Mild central venous congestion. No focal infiltrate. No overt pulmonary edema. IMPRESSION: 1. Central venous congestion and RIGHT pleural effusion. 2. Effusions and venous congestion appear slightly improved from prior but overall similar. Electronically Signed   By: Suzy Bouchard M.D.   On: 11/24/2018 18:34   Dg Chest 2 View  Result Date: 11/21/2018 CLINICAL DATA:  Severe shortness of breath. EXAM: CHEST - 2 VIEW COMPARISON:  09/16/2018. FINDINGS: Mediastinum hilar structures normal. Stable cardiomegaly. Bilateral pulmonary infiltrates/edema again noted. Small bilateral pleural effusions, right side greater than left,. IMPRESSION: Stable cardiomegaly. Bilateral pulmonary infiltrates/edema again noted. Small bilateral pulmonary infiltrates, right side greater than left, noted. CHF and/or bilateral pneumonia could present this fashion. Electronically Signed   By: Marcello Moores  Register   On: 11/21/2018 14:06   US Renal  Result Date: 11/24/2018 CLINICAL DATA:  Renal failure EXAM: RENAL / URINARY TRACT ULTRASOUND COMPLETE COMPARISON:  None. FINDINGS: Right Kidney: Renal measurements: 10.3 x 4.4 x 5.3 cm = volume: 126 mL. Mildly echogenic. No hydronephrosis or mass. Left Kidney: Renal measurements:  10.3 x 4.9 x 4.6 cm = volume: 121 mL. Mildly echogenic. No solid renal mass. Lower pole cyst measures 1.4 x 1.4 x 1.4 cm. Bladder: Appears normal for degree of bladder distention. IMPRESSION: 1. Mildly hyperechoic kidneys, compatible with chronic medical renal disease. 2. No hydronephrosis. Electronically Signed   By: Ulyses Jarred M.D.   On: 11/24/2018 18:56   Ir Fluoro Guide Cv Line Right  Result Date: 12/04/2018 INDICATION: Chronic CHF, access for continuous IV dobutamine therapy EXAM: TUNNELED PICC LINE WITH ULTRASOUND AND FLUOROSCOPIC GUIDANCE MEDICATIONS: 1% lidocaine local. ANESTHESIA/SEDATION: Moderate Sedation Time:  None. The patient was continuously monitored during the procedure by the interventional radiology nurse under my direct supervision. FLUOROSCOPY TIME:  Fluoroscopy Time: 0 minutes 18 seconds (1 mGy). COMPLICATIONS: None immediate. PROCEDURE: Informed written consent was obtained from the patient after a discussion of the risks, benefits, and alternatives to treatment. Questions regarding the procedure were encouraged and answered. The right neck and chest were prepped with chlorhexidine in a sterile fashion, and a sterile drape was applied covering the operative field. Maximum barrier sterile technique with sterile gowns and gloves were used for the procedure. A timeout was performed prior to the initiation of the procedure. After creating a small venotomy incision, a micropuncture kit was utilized to access the right internal jugular vein under direct, real-time ultrasound guidance after the overlying soft tissues were anesthetized with 1% lidocaine with epinephrine. Ultrasound image documentation was performed. The microwire was kinked to measure appropriate catheter length. The micropuncture sheath was exchanged for a peel-away sheath over a guidewire. A 5 French single lumen tunneled PICC measuring 23 cm was tunneled in a retrograde fashion from the anterior chest wall to the venotomy  incision. The catheter was then placed through the peel-away sheath with tip ultimately positioned at the superior caval-atrial junction. Final catheter positioning was confirmed and documented with a spot radiographic image. The catheter aspirates and flushes normally. The catheter was flushed with appropriate volume heparin dwells. The catheter exit site was secured with a 0-Prolene retention suture. The venotomy incision was closed with Dermabond. Dressings were applied. The patient tolerated the procedure well without immediate post procedural complication. FINDINGS: After catheter placement, the tip lies within the superior cavoatrial junction. The catheter aspirates and flushes normally and is ready for immediate use. IMPRESSION: Successful placement of 23cm single lumen tunneled PICC catheter via the right internal jugular  vein with tip terminating at the superior caval atrial junction. The catheter is ready for immediate use. Electronically Signed   By: Jerilynn Mages.  Shick M.D.   On: 12/04/2018 15:00   Ir US Guide Vasc Access Right  Result Date: 12/04/2018 INDICATION: Chronic CHF, access for continuous IV dobutamine therapy EXAM: TUNNELED PICC LINE WITH ULTRASOUND AND FLUOROSCOPIC GUIDANCE MEDICATIONS: 1% lidocaine local. ANESTHESIA/SEDATION: Moderate Sedation Time:  None. The patient was continuously monitored during the procedure by the interventional radiology nurse under my direct supervision. FLUOROSCOPY TIME:  Fluoroscopy Time: 0 minutes 18 seconds (1 mGy). COMPLICATIONS: None immediate. PROCEDURE: Informed written consent was obtained from the patient after a discussion of the risks, benefits, and alternatives to treatment. Questions regarding the procedure were encouraged and answered. The right neck and chest were prepped with chlorhexidine in a sterile fashion, and a sterile drape was applied covering the operative field. Maximum barrier sterile technique with sterile gowns and gloves were used for the  procedure. A timeout was performed prior to the initiation of the procedure. After creating a small venotomy incision, a micropuncture kit was utilized to access the right internal jugular vein under direct, real-time ultrasound guidance after the overlying soft tissues were anesthetized with 1% lidocaine with epinephrine. Ultrasound image documentation was performed. The microwire was kinked to measure appropriate catheter length. The micropuncture sheath was exchanged for a peel-away sheath over a guidewire. A 5 French single lumen tunneled PICC measuring 23 cm was tunneled in a retrograde fashion from the anterior chest wall to the venotomy incision. The catheter was then placed through the peel-away sheath with tip ultimately positioned at the superior caval-atrial junction. Final catheter positioning was confirmed and documented with a spot radiographic image. The catheter aspirates and flushes normally. The catheter was flushed with appropriate volume heparin dwells. The catheter exit site was secured with a 0-Prolene retention suture. The venotomy incision was closed with Dermabond. Dressings were applied. The patient tolerated the procedure well without immediate post procedural complication. FINDINGS: After catheter placement, the tip lies within the superior cavoatrial junction. The catheter aspirates and flushes normally and is ready for immediate use. IMPRESSION: Successful placement of 23cm single lumen tunneled PICC catheter via the right internal jugular vein with tip terminating at the superior caval atrial junction. The catheter is ready for immediate use. Electronically Signed   By: Jerilynn Mages.  Shick M.D.   On: 12/04/2018 15:00    Microbiology: Recent Results (from the past 240 hour(s))  Culture, Urine     Status: Abnormal   Collection Time: 11/29/18  2:55 PM   Specimen: Urine, Clean Catch  Result Value Ref Range Status   Specimen Description URINE, CLEAN CATCH  Final   Special Requests   Final      NONE Performed at [facility], [place] [address] [place], [place] [address]    Culture >=100,000 COLONIES/mL ESCHERICHIA COLI (A)  Final   Report Status 12/01/2018 FINAL  Final   Organism ID, Bacteria ESCHERICHIA COLI (A)  Final      Susceptibility   Escherichia coli - MIC*    AMPICILLIN >=32 RESISTANT Resistant     CEFAZOLIN <=4 SENSITIVE Sensitive     CEFTRIAXONE <=1 SENSITIVE Sensitive     CIPROFLOXACIN >=4 RESISTANT Resistant     GENTAMICIN <=1 SENSITIVE Sensitive     IMIPENEM <=0.25 SENSITIVE Sensitive     NITROFURANTOIN <=16 SENSITIVE Sensitive     TRIMETH/SULFA <=20 SENSITIVE Sensitive     AMPICILLIN/SULBACTAM >=32 RESISTANT Resistant  PIP/TAZO <=4 SENSITIVE Sensitive     Extended ESBL NEGATIVE Sensitive     * >=100,000 COLONIES/mL ESCHERICHIA COLI     Labs: CBC: No results for input(s): WBC, NEUTROABS, HGB, HCT, MCV, PLT in the last 168 hours. Basic Metabolic Panel: Recent Labs  Lab 12/02/18 0319 12/03/18 0349 12/04/18 0401 12/05/18 0249  NA 137 139 138 136  K 3.4* 3.6 3.1* 4.0  CL 110 108 108 106  CO2 17* 19* 18* 19*  GLUCOSE 150* 116* 118* 247*  BUN 45* 43* 38* 43*  CREATININE 3.61* 3.53* 3.40* 3.54*  CALCIUM 8.9 9.4 9.4 9.2  PHOS 3.5 3.6 4.4 3.3   Liver Function Tests: Recent Labs  Lab 12/02/18 0319 12/03/18 0349 12/04/18 0401 12/05/18 0249  ALBUMIN 3.1* 3.1* 3.1* 2.8*   No results for input(s): LIPASE, AMYLASE in the last 168 hours. No results for input(s): AMMONIA in the last 168 hours. Cardiac Enzymes: No results for input(s): CKTOTAL, CKMB, CKMBINDEX, TROPONINI in the last 168 hours. BNP (last 3 results) Recent Labs    08/06/18 0628 09/16/18 0342 11/24/18 1715  BNP 1,535.2* 1,254.7* 4,046.9*   CBG: Recent Labs  Lab 12/04/18 1651 12/04/18 2127 12/05/18 0631 12/05/18 0759 12/05/18 1202  GLUCAP 190* 166* 168* 142* 128*   Time spent: 35 minutes  Signed:  Berle Mull  [facility] 12/05/2018

## 2018-12-12 ENCOUNTER — Encounter (HOSPITAL_COMMUNITY): Payer: Self-pay | Admitting: Emergency Medicine

## 2018-12-12 ENCOUNTER — Emergency Department (HOSPITAL_COMMUNITY): Payer: Medicare HMO

## 2018-12-12 ENCOUNTER — Other Ambulatory Visit: Payer: Self-pay

## 2018-12-12 ENCOUNTER — Inpatient Hospital Stay (HOSPITAL_COMMUNITY)
Admission: EM | Admit: 2018-12-12 | Discharge: 2018-12-18 | DRG: 291 | Disposition: A | Discharge status: Hospice | Payer: Medicare HMO | Attending: Internal Medicine | Admitting: Internal Medicine

## 2018-12-12 DIAGNOSIS — I5023 Acute on chronic systolic (congestive) heart failure: Secondary | ICD-10-CM | POA: Diagnosis present

## 2018-12-12 DIAGNOSIS — Z1159 Encounter for screening for other viral diseases: Secondary | ICD-10-CM | POA: Diagnosis not present

## 2018-12-12 DIAGNOSIS — J9621 Acute and chronic respiratory failure with hypoxia: Secondary | ICD-10-CM | POA: Diagnosis present

## 2018-12-12 DIAGNOSIS — Z681 Body mass index (BMI) 19 or less, adult: Secondary | ICD-10-CM | POA: Diagnosis not present

## 2018-12-12 DIAGNOSIS — R0602 Shortness of breath: Secondary | ICD-10-CM | POA: Diagnosis not present

## 2018-12-12 DIAGNOSIS — I1310 Hypertensive heart and chronic kidney disease without heart failure, with stage 1 through stage 4 chronic kidney disease, or unspecified chronic kidney disease: Secondary | ICD-10-CM

## 2018-12-12 DIAGNOSIS — Z23 Encounter for immunization: Secondary | ICD-10-CM | POA: Diagnosis present

## 2018-12-12 DIAGNOSIS — I252 Old myocardial infarction: Secondary | ICD-10-CM | POA: Diagnosis not present

## 2018-12-12 DIAGNOSIS — N189 Chronic kidney disease, unspecified: Secondary | ICD-10-CM | POA: Diagnosis not present

## 2018-12-12 DIAGNOSIS — N184 Chronic kidney disease, stage 4 (severe): Secondary | ICD-10-CM

## 2018-12-12 DIAGNOSIS — N170 Acute kidney failure with tubular necrosis: Secondary | ICD-10-CM | POA: Diagnosis present

## 2018-12-12 DIAGNOSIS — E876 Hypokalemia: Secondary | ICD-10-CM | POA: Diagnosis not present

## 2018-12-12 DIAGNOSIS — E1122 Type 2 diabetes mellitus with diabetic chronic kidney disease: Secondary | ICD-10-CM | POA: Diagnosis present

## 2018-12-12 DIAGNOSIS — R627 Adult failure to thrive: Secondary | ICD-10-CM | POA: Diagnosis present

## 2018-12-12 DIAGNOSIS — N185 Chronic kidney disease, stage 5: Secondary | ICD-10-CM | POA: Diagnosis present

## 2018-12-12 DIAGNOSIS — I2510 Atherosclerotic heart disease of native coronary artery without angina pectoris: Secondary | ICD-10-CM | POA: Diagnosis present

## 2018-12-12 DIAGNOSIS — R0902 Hypoxemia: Secondary | ICD-10-CM

## 2018-12-12 DIAGNOSIS — Z79899 Other long term (current) drug therapy: Secondary | ICD-10-CM

## 2018-12-12 DIAGNOSIS — J440 Chronic obstructive pulmonary disease with acute lower respiratory infection: Secondary | ICD-10-CM | POA: Diagnosis present

## 2018-12-12 DIAGNOSIS — D631 Anemia in chronic kidney disease: Secondary | ICD-10-CM | POA: Diagnosis present

## 2018-12-12 DIAGNOSIS — I132 Hypertensive heart and chronic kidney disease with heart failure and with stage 5 chronic kidney disease, or end stage renal disease: Principal | ICD-10-CM | POA: Diagnosis present

## 2018-12-12 DIAGNOSIS — E119 Type 2 diabetes mellitus without complications: Secondary | ICD-10-CM

## 2018-12-12 DIAGNOSIS — Z515 Encounter for palliative care: Secondary | ICD-10-CM | POA: Diagnosis present

## 2018-12-12 DIAGNOSIS — Z66 Do not resuscitate: Secondary | ICD-10-CM | POA: Diagnosis present

## 2018-12-12 DIAGNOSIS — I272 Pulmonary hypertension, unspecified: Secondary | ICD-10-CM | POA: Diagnosis present

## 2018-12-12 DIAGNOSIS — I255 Ischemic cardiomyopathy: Secondary | ICD-10-CM | POA: Diagnosis present

## 2018-12-12 DIAGNOSIS — Z7982 Long term (current) use of aspirin: Secondary | ICD-10-CM

## 2018-12-12 DIAGNOSIS — N179 Acute kidney failure, unspecified: Secondary | ICD-10-CM | POA: Diagnosis not present

## 2018-12-12 DIAGNOSIS — F411 Generalized anxiety disorder: Secondary | ICD-10-CM | POA: Diagnosis present

## 2018-12-12 DIAGNOSIS — R0601 Orthopnea: Secondary | ICD-10-CM | POA: Diagnosis not present

## 2018-12-12 DIAGNOSIS — Z87891 Personal history of nicotine dependence: Secondary | ICD-10-CM | POA: Diagnosis not present

## 2018-12-12 DIAGNOSIS — N183 Chronic kidney disease, stage 3 unspecified: Secondary | ICD-10-CM

## 2018-12-12 DIAGNOSIS — J9601 Acute respiratory failure with hypoxia: Secondary | ICD-10-CM | POA: Diagnosis not present

## 2018-12-12 DIAGNOSIS — Z7189 Other specified counseling: Secondary | ICD-10-CM | POA: Diagnosis not present

## 2018-12-12 HISTORY — DX: Dyspnea, unspecified: R06.00

## 2018-12-12 LAB — CBC
HCT: 34.5 % — ABNORMAL LOW (ref 36.0–46.0)
Hemoglobin: 10.3 g/dL — ABNORMAL LOW (ref 12.0–15.0)
MCH: 27 pg (ref 26.0–34.0)
MCHC: 29.9 g/dL — ABNORMAL LOW (ref 30.0–36.0)
MCV: 90.6 fL (ref 80.0–100.0)
Platelets: 472 10*3/uL — ABNORMAL HIGH (ref 150–400)
RBC: 3.81 MIL/uL — ABNORMAL LOW (ref 3.87–5.11)
RDW: 18.9 % — ABNORMAL HIGH (ref 11.5–15.5)
WBC: 7.1 10*3/uL (ref 4.0–10.5)
nRBC: 0 % (ref 0.0–0.2)

## 2018-12-12 LAB — SARS CORONAVIRUS 2: SARS Coronavirus 2: NOT DETECTED

## 2018-12-12 LAB — COOXEMETRY PANEL
Carboxyhemoglobin: 0.9 % (ref 0.5–1.5)
Methemoglobin: 0.7 % (ref 0.0–1.5)
O2 Saturation: 35.3 %
Total hemoglobin: 11.4 g/dL — ABNORMAL LOW (ref 12.0–16.0)

## 2018-12-12 LAB — BRAIN NATRIURETIC PEPTIDE: B Natriuretic Peptide: 1631 pg/mL — ABNORMAL HIGH (ref 0.0–100.0)

## 2018-12-12 LAB — BASIC METABOLIC PANEL
Anion gap: 16 — ABNORMAL HIGH (ref 5–15)
BUN: 47 mg/dL — ABNORMAL HIGH (ref 8–23)
CO2: 19 mmol/L — ABNORMAL LOW (ref 22–32)
Calcium: 9.6 mg/dL (ref 8.9–10.3)
Chloride: 102 mmol/L (ref 98–111)
Creatinine, Ser: 4.01 mg/dL — ABNORMAL HIGH (ref 0.44–1.00)
GFR calc Af Amer: 12 mL/min — ABNORMAL LOW (ref >60)
GFR calc non Af Amer: 11 mL/min — ABNORMAL LOW (ref >60)
Glucose, Bld: 226 mg/dL — ABNORMAL HIGH (ref 70–99)
Potassium: 4.3 mmol/L (ref 3.5–5.1)
Sodium: 137 mmol/L (ref 135–145)

## 2018-12-12 LAB — TROPONIN I: Troponin I: 0.04 ng/mL (ref <0.03)

## 2018-12-12 MED ORDER — SODIUM CHLORIDE 0.9% FLUSH
3.0000 mL | Freq: Two times a day (BID) | INTRAVENOUS | Status: DC
  Administered 2018-12-12 – 2018-12-18 (×9): 3 mL via INTRAVENOUS

## 2018-12-12 MED ORDER — WHITE PETROLATUM EX OINT: TOPICAL_OINTMENT | CUTANEOUS | Status: DC | PRN

## 2018-12-12 MED ORDER — ONDANSETRON HCL 4 MG/2ML IJ SOLN: 4.0000 mg | Freq: Four times a day (QID) | INTRAMUSCULAR | Status: DC | PRN

## 2018-12-12 MED ORDER — ACETAMINOPHEN 325 MG PO TABS: 650.0000 mg | ORAL_TABLET | ORAL | Status: DC | PRN

## 2018-12-12 MED ORDER — WHITE PETROLATUM EX OINT
1.0000 "application " | TOPICAL_OINTMENT | CUTANEOUS | Status: DC | PRN
  Filled 2018-12-12: qty 5

## 2018-12-12 MED ORDER — SODIUM CHLORIDE 0.9 % IV SOLN
250.0000 mL | INTRAVENOUS | Status: DC | PRN
  Administered 2018-12-14: 250 mL via INTRAVENOUS

## 2018-12-12 MED ORDER — WHITE PETROLATUM EX OINT
TOPICAL_OINTMENT | CUTANEOUS | Status: DC | PRN
  Filled 2018-12-12: qty 28.35

## 2018-12-12 MED ORDER — HEPARIN SODIUM (PORCINE) 5000 UNIT/ML IJ SOLN
5000.0000 [IU] | Freq: Three times a day (TID) | INTRAMUSCULAR | Status: DC
  Administered 2018-12-12 – 2018-12-17 (×14): 5000 [IU] via SUBCUTANEOUS
  Filled 2018-12-12 (×14): qty 1

## 2018-12-12 MED ORDER — METOLAZONE 2.5 MG PO TABS
2.5000 mg | ORAL_TABLET | Freq: Once | ORAL | Status: AC
  Administered 2018-12-12: 2.5 mg via ORAL
  Filled 2018-12-12: qty 1

## 2018-12-12 MED ORDER — TICAGRELOR 90 MG PO TABS
90.0000 mg | ORAL_TABLET | Freq: Two times a day (BID) | ORAL | Status: DC
  Administered 2018-12-12 – 2018-12-18 (×13): 90 mg via ORAL
  Filled 2018-12-12 (×13): qty 1

## 2018-12-12 MED ORDER — FUROSEMIDE 10 MG/ML IJ SOLN: 40.0000 mg | Freq: Two times a day (BID) | INTRAMUSCULAR | Status: DC

## 2018-12-12 MED ORDER — SODIUM CHLORIDE 0.9% FLUSH: 3.0000 mL | INTRAVENOUS | Status: DC | PRN

## 2018-12-12 MED ORDER — ASPIRIN EC 81 MG PO TBEC
81.0000 mg | DELAYED_RELEASE_TABLET | Freq: Every day | ORAL | Status: DC
  Administered 2018-12-13 – 2018-12-18 (×6): 81 mg via ORAL
  Filled 2018-12-12 (×6): qty 1

## 2018-12-12 MED ORDER — ASPIRIN 81 MG PO CHEW
324.0000 mg | CHEWABLE_TABLET | Freq: Once | ORAL | Status: AC
  Administered 2018-12-12: 324 mg via ORAL
  Filled 2018-12-12: qty 4

## 2018-12-12 MED ORDER — FUROSEMIDE 10 MG/ML IJ SOLN
60.0000 mg | Freq: Once | INTRAMUSCULAR | Status: AC
  Administered 2018-12-12: 60 mg via INTRAVENOUS
  Filled 2018-12-12: qty 6

## 2018-12-12 MED ORDER — FUROSEMIDE 10 MG/ML IJ SOLN
120.0000 mg | Freq: Two times a day (BID) | INTRAVENOUS | Status: AC
  Administered 2018-12-12 – 2018-12-14 (×5): 120 mg via INTRAVENOUS
  Filled 2018-12-12 (×3): qty 10
  Filled 2018-12-12 (×2): qty 12
  Filled 2018-12-12 (×2): qty 10

## 2018-12-12 MED ORDER — ATORVASTATIN CALCIUM 40 MG PO TABS
40.0000 mg | ORAL_TABLET | Freq: Every day | ORAL | Status: DC
  Administered 2018-12-12 – 2018-12-16 (×5): 40 mg via ORAL
  Filled 2018-12-12 (×5): qty 1

## 2018-12-12 MED ORDER — PNEUMOCOCCAL VAC POLYVALENT 25 MCG/0.5ML IJ INJ
0.5000 mL | INJECTION | INTRAMUSCULAR | Status: AC
  Administered 2018-12-13: 0.5 mL via INTRAMUSCULAR
  Filled 2018-12-12: qty 0.5

## 2018-12-12 MED ORDER — WHITE PETROLATUM EX OINT: 1.0000 "application " | TOPICAL_OINTMENT | CUTANEOUS | Status: DC | PRN

## 2018-12-12 MED ORDER — DOBUTAMINE IN D5W 4-5 MG/ML-% IV SOLN
2.5000 ug/kg/min | INTRAVENOUS | Status: DC
  Administered 2018-12-12: 2.5 ug/kg/min via INTRAVENOUS
  Filled 2018-12-12: qty 250

## 2018-12-12 NOTE — Consult Note (Signed)
Consultation Note Date: 12/12/2018   Patient Name: [name]  DOB: April 12, 1951  MRN: [id number]  Age / Sex: 68 y.o., female  PCP: [name], MD Referring Physician: Karmen Bongo, MD  Reason for Consultation: Establishing goals of care, Hospice Evaluation and Psychosocial/spiritual support  HPI/Patient Profile: 68 y.o. female  with past medical history of CAD, CHF (EF 20-25%), COPD, PEA arrest, CKD stg V, who was admitted on 12/12/2018 with increase SOB.  She has had multiple admissions recently for heart failure and was discharged on 12/05/2018 on dobutamine and IV lasix in the home.  Initial work up today reveals a BNP of 1631, bilateral pulmonary edema on CXR, and GFR of less than 12.  Clinical Assessment and Goals of Care:  I have reviewed medical records including EPIC notes, labs and imaging, assessed the patient and then attempted  to discuss diagnosis prognosis, GOC, EOL wishes, disposition and options.  I introduced Palliative Medicine as specialized medical care for people living with serious illness. It focuses on providing relief from the symptoms and stress of a serious illness. The goal is to improve quality of life for both the patient and the family.  We discussed a brief life review of the patient. She is from [place].  She moved to [place] because her wife's family was getting old and needed support.  She and her wife have no children.  She believes in God but does not feel necessarily spiritual.  She enjoys watching survivalist reality TV shows.  [name] is a very positive person.  She giggles and jokes thru out our conversations.  As far as functional and nutritional status she is able to do some things for herself but does need help with ADLs.  She has been eating somewhat.  She does not complain of difficulty sleeping, or with pain or bowel movements.  We discussed her current illness  and what it means in the larger context of her on-going co-morbidities.  Natural disease trajectory and expectations at EOL were discussed.  I attempted to elicit values and goals of care important to the patient.  [name] states "That's want I want to talk to [facility] about when I get home".  She tells me she did not want to come to the hospital this time - she wanted to attempt to get it under control herself.  At a high level we talked about when to continue aggressive medical care vs when she would just like to stay at home and be comfortable until she passes away.  Ms. Kushnir indicated again that she would like to talk with Colletta Maryland about that.  "I'll be ok either way".  Questions and concerns were addressed.   The family was encouraged to call with questions or concerns.    Primary Decision Maker:  PATIENT    SUMMARY OF RECOMMENDATIONS    Established initial rapport  Her wife Colletta Maryland is her decision making surrogate.  Patient understands she is nearing EOL, but maintains a joking attitude about it "I'll be alright  because I'll be dead!"  PMT will return tomorrow to further conversations a bit and hopefully involve Stephanie by conference call.  If patient opts against aggressive medical interventions (no home IV medications) she is currently hospice eligible.  Code Status/Advance Care Planning:  DNR   Symptom Management:   Per primary team.  Additional Recommendations (Limitations, Scope, Preferences):  Full Scope Treatment  Psycho-social/Spiritual:   Desire for further Chaplaincy support: not at this time.  Prognosis:  Likely weeks.  However, given failure of her kidneys and heart she is at high risk of acute decline and death at any point.  Discharge Planning: To Be Determined      Primary Diagnoses: Present on Admission: . Cardiorenal syndrome   I have reviewed the medical record, interviewed the patient and family, and examined the patient. The  following aspects are pertinent.  Past Medical History:  Diagnosis Date  . AKI (acute kidney injury) ([place]) 11/2018  . CHF (congestive heart failure) ([place])   . COPD (chronic obstructive pulmonary disease) ([place])   . Diabetes mellitus without complication ([place])   . Dyspnea   . Hypertension   . NSTEMI (non-ST elevated myocardial infarction) [facility])    Social History   Socioeconomic History  . Marital status: Married    Spouse name: Not on file  . Number of children: Not on file  . Years of education: Not on file  . Highest education level: Not on file  Occupational History  . Occupation: retired  Scientific laboratory technician  . Financial resource strain: Not on file  . Food insecurity    Worry: Not on file    Inability: Not on file  . Transportation needs    Medical: Not on file    Non-medical: Not on file  Tobacco Use  . Smoking status: Former Smoker    Packs/day: 1.00    Years: 55.00    Pack years: 55.00    Types: Cigarettes    Quit date: 07/29/2018    Years since quitting: 0.3  . Smokeless tobacco: Never Used  Substance and Sexual Activity  . Alcohol use: Not Currently    Frequency: Never    Comment: "I was drinking just as much as I smoked, can't do it anymore"  . Drug use: Never  . Sexual activity: Not on file  Lifestyle  . Physical activity    Days per week: Not on file    Minutes per session: Not on file  . Stress: Not on file  Relationships  . Social Herbalist on phone: Not on file    Gets together: Not on file    Attends religious service: Not on file    Active member of club or organization: Not on file    Attends meetings of clubs or organizations: Not on file    Relationship status: Not on file  Other Topics Concern  . Not on file  Social History Narrative  . Not on file   Family History  Problem Relation Age of Onset  . Breast cancer Neg Hx    Scheduled Meds: . [START ON 12/13/2018] aspirin EC  81 mg Oral Daily  . atorvastatin  40 mg Oral q1800  .  heparin  5,000 Units Subcutaneous Q8H  . [START ON 12/13/2018] pneumococcal 23 valent vaccine  0.5 mL Intramuscular Tomorrow-1000  . sodium chloride flush  3 mL Intravenous Q12H  . ticagrelor  90 mg Oral BID   Continuous Infusions: . sodium chloride    .  DOBUTamine 2.5 mcg/kg/min (12/12/18 1519)  . furosemide     PRN Meds:.sodium chloride, acetaminophen, ondansetron (ZOFRAN) IV, sodium chloride flush No Known Allergies Review of Systems denies chest pain, changes in bowel habits, dysuria, insomnia, anxiety.  Complains of SOB and cough, decreased appetite.  Physical Exam  Thin, elderly appearing female, smiling.  Awake, alert, coherent, cooperative, NAD  Vital Signs: BP 103/87 (BP Location: Left Arm)   Pulse 95   Temp (!) 97.4 F (36.3 C) (Oral)   Resp 20   Ht 5\' 2"  (1.575 m)   Wt 47.1 kg   LMP  (Exact Date)   SpO2 99%   BMI 18.99 kg/m  Pain Scale: 0-10   Pain Score: 0-No pain   SpO2: SpO2: 99 % O2 Device:SpO2: 99 % O2 Flow Rate: .O2 Flow Rate (L/min): 2 L/min  IO: Intake/output summary:   Intake/Output Summary (Last 24 hours) at 12/12/2018 1650 Last data filed at 12/12/2018 1500 Gross per 24 hour  Intake -  Output 301 ml  Net -301 ml    LBM:   Baseline Weight: Weight: 49.9 kg Most recent weight: Weight: 47.1 kg     Palliative Assessment/Data:40%     Time In: 4:00 pm Time Out: 4:50 pm. Time Total: 50 min. Visit consisted of counseling and education dealing with the complex and emotionally intense issues surrounding the need for palliative care and symptom management in the setting of serious and potentially life-threatening illness. Greater than 50%  of this time was spent counseling and coordinating care related to the above assessment and plan.  Signed by: Florentina Jenny, PA-C Palliative Medicine Pager: [phone]  Please contact Palliative Medicine Team phone at [phone] for questions and concerns.  For individual provider: See Shea Evans

## 2018-12-12 NOTE — Consult Note (Addendum)
Advanced Heart Failure Team Consult Note   Primary Physician: [name], MD PCP-Cardiologist:  No primary care provider on file.  Reason for Consultation: Heart Failure   HPI:    Ita Chalupa is seen today for evaluation of heart failure at the request of Dr Lorin Mercy.    Ms Kirshner is a 68 year old with a history of  DMII, HTN, former tobacco use, CAD, h/o PEA arrest, chronic systolic HF.   Admitted 2/9/2020with increased shortness of breath and chest pain. Troponin 7.5>6.5>6.3 and BNP 1535. CXR was concerning for pulmonary edema so IV lasix was given. Taken to the cath lab on 2/10 but this was aborted due to PEA arrest. CPR started and epi was given with ROSC in 6-8 minutes. Due to failure to protect airway she was intubated. ECHO performed and showed reduced LVEF 20-25% (new) and normal RV. Returned to the cath lab 2/13/2020and had DES to80% mLAD. RHC showed pulmonaryvenous HTN.Self-extubated 2/13 and weaned off milrinone 08/12/2018.  Admitted 3/21-24/20 with recurrent respiratory distress and ADHF in setting of dietary indiscretion and not using PRN lasix. Diuresed. D/c weight 121 pounds.   She was readmitted the end of May with AKI and A/C systolic heart failure. Creatinine was up to 4.38. ECHO showed EF 25-30% and normal RV. Due to concern for low output heart failure dobutamine was started. RHC was completed and showed moderately elevated filling pressures and normal cardiac output.  Dobutamine was continued after discharged at 2.5 mcg/kg/min.   Today she presented to the ED due to increased dyspnea and cough.  CXR concerning for bilateral pulmonary edema. Admission labs included creatinine 4, BNP 1631, hgb 10.3, and troponin 0.04. She remains on dobutamine 2.5 mcg. She was started on 40 mg IV lasix.     Review of Systems: [y] = yes, [ ]  = no   . General: Weight gain [ Y]; Weight loss [ ] ; Anorexia [ ] ; Fatigue [Y ]; Fever [ ] ; Chills [ ] ; Weakness [Y ]  . Cardiac: Chest  pain/pressure [ ] ; Resting SOB [ Y]; Exertional SOB [Y ]; Orthopnea [ Y]; Pedal Edema [ ] ; Palpitations [ ] ; Syncope [ ] ; Presyncope [ ] ; Paroxysmal nocturnal dyspnea[ ]   . Pulmonary: Cough [Y ]; Wheezing[ ] ; Hemoptysis[ ] ; Sputum [ ] ; Snoring [ ]   . GI: Vomiting[ ] ; Dysphagia[ ] ; Melena[ ] ; Hematochezia [ ] ; Heartburn[ ] ; Abdominal pain [ ] ; Constipation [ ] ; Diarrhea [ ] ; BRBPR [ ]   . GU: Hematuria[ ] ; Dysuria [ ] ; Nocturia[ ]   . Vascular: Pain in legs with walking [ ] ; Pain in feet with lying flat [ ] ; Non-healing sores [ ] ; Stroke [ ] ; TIA [ ] ; Slurred speech [ ] ;  . Neuro: Headaches[ ] ; Vertigo[ ] ; Seizures[ ] ; Paresthesias[ ] ;Blurred vision [ ] ; Diplopia [ ] ; Vision changes [ ]   . Ortho/Skin: Arthritis Jazmín.Cullens ]; Joint pain [Y ]; Muscle pain [Y ]; Joint swelling [ ] ; Back Pain [Y]; Rash [ ]   . Psych: Depression[ Y]; Anxiety[ ]   . Heme: Bleeding problems [ ] ; Clotting disorders [ ] ; Anemia [ ]   . Endocrine: Diabetes [Y ]; Thyroid dysfunction[ ]   Home Medications Prior to Admission medications   Medication Sig Start Date End Date Taking? Authorizing Provider  aspirin 81 MG chewable tablet Chew 1 tablet (81 mg total) by mouth daily. 08/18/18  Yes Swayze, Ava, DO  atorvastatin (LIPITOR) 40 MG tablet Take 1 tablet (40 mg total) by mouth daily at 6 PM. 08/17/18  Yes Swayze, Ava, DO  docusate sodium (COLACE) 100 MG capsule Take 1 capsule (100 mg total) by mouth 2 (two) times daily. 12/05/18  Yes Lavina Hamman, MD  furosemide (LASIX) 40 MG tablet Take 1 tablet (40 mg total) by mouth as directed. Lasix 40 mg Monday, Wednesday and Friday. Also take 1 tab as needed for weight gain of 3lbs in 1 day or 5lbs in 2 days Patient taking differently: Take 40 mg by mouth See admin instructions. Take 40 mg Monday, Wednesday and Friday, may take one additional tablet as needed for weight gain of 3lbs in 1 day or 5lbs in 2 days. 12/05/18  Yes Lavina Hamman, MD  Magnesium Oxide 400 (240 Mg) MG TABS Take 1 tablet (400 mg  total) by mouth daily. 11/10/18  Yes Jenaye Rickert, Shaune Pascal, MD  pantoprazole (PROTONIX) 40 MG tablet Take 1 tablet (40 mg total) by mouth daily. 08/18/18  Yes Swayze, Ava, DO  potassium chloride (K-DUR) 10 MEQ tablet Take 2 tablets (20 mEq total) by mouth 3 (three) times a week. With lasix. Patient taking differently: Take 20 mEq by mouth See admin instructions. Take two tablets on MON WED and FRI, take an additional two tablets when taking an additional dose of Furosemide for weight gain. 12/06/18  Yes Lavina Hamman, MD  ticagrelor (BRILINTA) 90 MG TABS tablet Take 1 tablet (90 mg total) by mouth 2 (two) times daily. 08/17/18  Yes Swayze, Ava, DO  Nutritional Supplements (FEEDING SUPPLEMENT, NEPRO CARB STEADY,) LIQD Take 237 mLs by mouth 2 (two) times daily between meals. Patient not taking: Reported on 12/12/2018 12/05/18   Lavina Hamman, MD    Past Medical History: Past Medical History:  Diagnosis Date  . AKI (acute kidney injury) ([place]) 11/2018  . CHF (congestive heart failure) ([place])   . COPD (chronic obstructive pulmonary disease) ([place])   . Diabetes mellitus without complication ([place])   . Hypertension   . NSTEMI (non-ST elevated myocardial infarction) [facility])     Past Surgical History: Past Surgical History:  Procedure Laterality Date  . CORONARY STENT INTERVENTION N/A 08/10/2018   Procedure: CORONARY STENT INTERVENTION;  Surgeon: Leonie Man, MD;  Location: [place] [facility];  Service: Cardiovascular;  Laterality: N/A;  . IR FLUORO GUIDE CV LINE RIGHT  12/04/2018  . IR US GUIDE VASC ACCESS RIGHT  12/04/2018  . RIGHT HEART CATH N/A 11/27/2018   Procedure: RIGHT HEART CATH;  Surgeon: Jolaine Artist, MD;  Location: [place] [facility];  Service: Cardiovascular;  Laterality: N/A;  . RIGHT/LEFT HEART CATH AND CORONARY ANGIOGRAPHY N/A 08/10/2018   Procedure: RIGHT/LEFT HEART CATH AND CORONARY ANGIOGRAPHY;  Surgeon: Leonie Man, MD;  Location: [place] [facility];  Service: Cardiovascular;   Laterality: N/A;    Family History: Family History  Problem Relation Age of Onset  . Breast cancer Neg Hx     Social History: Social History   Socioeconomic History  . Marital status: Married    Spouse name: Not on file  . Number of children: Not on file  . Years of education: Not on file  . Highest education level: Not on file  Occupational History  . Occupation: retired  Scientific laboratory technician  . Financial resource strain: Not on file  . Food insecurity    Worry: Not on file    Inability: Not on file  . Transportation needs    Medical: Not on file    Non-medical: Not on file  Tobacco Use  .  Smoking status: Former Smoker    Packs/day: 1.00    Years: 55.00    Pack years: 55.00    Types: Cigarettes    Quit date: 07/29/2018    Years since quitting: 0.3  . Smokeless tobacco: Never Used  Substance and Sexual Activity  . Alcohol use: Not Currently    Frequency: Never    Comment: "I was drinking just as much as I smoked, can't do it anymore"  . Drug use: Never  . Sexual activity: Not on file  Lifestyle  . Physical activity    Days per week: Not on file    Minutes per session: Not on file  . Stress: Not on file  Relationships  . Social Herbalist on phone: Not on file    Gets together: Not on file    Attends religious service: Not on file    Active member of club or organization: Not on file    Attends meetings of clubs or organizations: Not on file    Relationship status: Not on file  Other Topics Concern  . Not on file  Social History Narrative  . Not on file    Allergies:  No Known Allergies  Objective:    Vital Signs:   Temp:  [98.7 F (37.1 C)] 98.7 F (37.1 C) (06/16 0610) Pulse Rate:  [104-115] 104 (06/16 0908) Resp:  [14-30] 27 (06/16 0945) BP: (109-125)/(75-93) 113/85 (06/16 0945) SpO2:  [87 %-99 %] 99 % (06/16 0908) Weight:  [49.9 kg] 49.9 kg (06/16 0608)    Weight change: Filed Weights   12/12/18 0608  Weight: 49.9 kg     Intake/Output:  No intake or output data in the 24 hours ending 12/12/18 1026    Physical Exam    General:  Sitting up in bed + dyspnea HEENT: normal Neck: supple. JVP 8-9 . + tunneled PICC Carotids 2+ bilat; no bruits. No lymphadenopathy or thyromegaly appreciated. Cor: PMI nondisplaced. Tachy regular + s3 Lungs: diffuse crackles bilaterally  Abdomen: soft, nontender, + distended. No hepatosplenomegaly. No bruits or masses. Good bowel sounds. Extremities: no cyanosis, clubbing, rash, edema Neuro: alert & orientedx3, cranial nerves grossly intact. moves all 4 extremities w/o difficulty. Affect pleasant   Telemetry   Sinus tach 100-110 Personally reviewed   EKG    Sinus Tach 108 bpm   Labs   Basic Metabolic Panel: Recent Labs  Lab 12/12/18 0639  NA 137  K 4.3  CL 102  CO2 19*  GLUCOSE 226*  BUN 47*  CREATININE 4.01*  CALCIUM 9.6    Liver Function Tests: No results for input(s): AST, ALT, ALKPHOS, BILITOT, PROT, ALBUMIN in the last 168 hours. No results for input(s): LIPASE, AMYLASE in the last 168 hours. No results for input(s): AMMONIA in the last 168 hours.  CBC: Recent Labs  Lab 12/12/18 0639  WBC 7.1  HGB 10.3*  HCT 34.5*  MCV 90.6  PLT 472*    Cardiac Enzymes: Recent Labs  Lab 12/12/18 0639  TROPONINI 0.04*    BNP: BNP (last 3 results) Recent Labs    09/16/18 0342 11/24/18 1715 12/12/18 0639  BNP 1,254.7* 4,046.9* 1,631.0*    ProBNP (last 3 results) No results for input(s): PROBNP in the last 8760 hours.   CBG: Recent Labs  Lab 12/05/18 1202  GLUCAP 128*    Coagulation Studies: No results for input(s): LABPROT, INR in the last 72 hours.   Imaging   Dg Chest Portable 1 View  Result  Date: 12/12/2018 CLINICAL DATA:  Shortness of breath. EXAM: PORTABLE CHEST 1 VIEW COMPARISON:  11/24/2018. FINDINGS: Central line noted with tip over the cavoatrial junction. Stable cardiomegaly. Progressive bilateral pulmonary infiltrates  most consistent bilateral pulmonary edema noted. Bilateral pneumonia cannot be excluded. Persistent right pleural effusion. New left pleural effusion. These findings are most consistent with CHF. No pneumothorax. IMPRESSION: 1 central line noted with tip over cavoatrial junction. 2. Cardiomegaly with bilateral pulmonary infiltrates most consistent with bilateral pulmonary edema. Bilateral pneumonia cannot be excluded. Persistent right pleural effusion. New left pleural effusion. These findings together most consistent with CHF. Electronically Signed   By: Marcello Moores  Register   On: 12/12/2018 07:00      Medications:     Current Medications: . furosemide  40 mg Intravenous Q12H  . heparin  5,000 Units Subcutaneous Q8H  . sodium chloride flush  3 mL Intravenous Q12H     Infusions: . sodium chloride         Patient Profile  Ms Betty is a 68 year old with a history of  DMII, HTN, former tobacco use, CAD, h/o PEA arrest, chronic systolic HF.   Admitted with A/C systolic heart failure on home dobutamine 2.5 mcg.   Assessment/Plan   1. A/C Systolic HF, suspect ICM/NICM. ECHO 10/2018 Ef 25-30% .  On dobutamine 2.5 mcg at home. Check CO-OX.  BNP elevated > 1500. CXR concerning for pulmonary edema. Prior to admit she was taking lasix 40 mg M-W-F. -. Volume status elevated. Started IV lasix 40 mg twice a day and follow response.  -No bb with low output. No Arb/spiro/dig with elevated creatinine.   2. AKI on CKD Stage IV.  Creatinine she was discharged on 6/9 was 3.5 but today creatinine 4.  Follow BMET daily.  Nephrology consulted   3. CAD  S/P NSTEMI  08/10/18 LHC with DES 80% mLAD Restart ticagrelor + asa 81 mg daily + atorvastatin 40 mg dailyl.  Troponin 0.04.   4.DNR Palliative Care consulted. Not sure how much dobutamine is helping. She may need Hospice.    Length of Stay: 0  Darrick Grinder, NP  12/12/2018, 10:26 AM  Advanced Heart Failure Team Pager [phone] (M-F; 7a - 4p)   Please contact [facility] for night-coverage after hours (4p -7a ) and weekends on [email or web address]  Patient seen and examined with the above-signed Advanced Practice Provider and/or Housestaff. I personally reviewed laboratory data, imaging studies and relevant notes. I independently examined the patient and formulated the important aspects of the plan. I have edited the note to reflect any of my changes or salient points. I have personally discussed the plan with the patient and/or family.  68 y/o woman with advanced COPD, CKD 4, CAD with recent LAD stent and end-stage chronic systolic HF recently discharged on home dobutamine.   Now readmitted with worsening HF and respiratory with diffuse pulmonary edema on CXR and Class IV sx. Creatinine back up to 4.0 (from 3.5)   On exam very dyspneic JVP 8-9  Cor tachy regular +s3 Lungs diffuse crackles Ab distended Ext no c/c/e  She has end-stage HF and advanced CKD now with worsening HF symptoms and volume overload despite dobutamine support. Will continue dobutamine. Increase lasix to 120 IV bid. Add metolazone. Check co-ox off PICC. Will likely need Hospice as she is not HD candidate.   Glori Bickers, MD  3:25 PM

## 2018-12-12 NOTE — Progress Notes (Signed)
Physical Therapy Evaluation Patient Details Name: [name] MRN: [id number] DOB: April 01, 1951 Today's Date: 12/12/2018   History of Present Illness  68 y.o. female  with past medical history of CAD, CHF (EF 20-25%), COPD, PEA arrest, CKD stg V, who was admitted on 12/12/2018 with increase SOB.  She has had multiple admissions recently for heart failure and was discharged on 12/05/2018 on dobutamine and IV lasix in the home.  Work up includes bilateral pulmonary edema on CXR,    Clinical Impression  Pt admitted with/for SOB.  Pt dyspneic on evaluation and limited in mobility though did not need more than min guard assist at any one time..  Pt currently limited functionally due to the problems listed below.  (see problems list.)  Pt will benefit from PT to maximize function and safety to be able to get home safely with available assist.     Follow Up Recommendations Home health PT    Equipment Recommendations  None recommended by PT    Recommendations for Other Services       Precautions / Restrictions Precautions Precautions: Fall      Mobility  Bed Mobility Overal bed mobility: Modified Independent                Transfers Overall transfer level: Needs assistance Equipment used: 4-wheeled walker Transfers: Sit to/from Stand Sit to Stand: Min guard            Ambulation/Gait Ambulation/Gait assistance: Min guard Gait Distance (Feet): 80 Feet(then 59feet and additional 20 feet to bed) Assistive device: 4-wheeled walker Gait Pattern/deviations: Step-through pattern Gait velocity: decreased  Gait velocity interpretation: 1.31 - 2.62 ft/sec, indicative of limited community ambulator General Gait Details: generally steady with rollator, but pushed herself past her appropriate limit.  Though with notable dyspnea, sats remained in low 90's on 2L and 96% after rest.  Stairs            Wheelchair Mobility    Modified Rankin (Stroke Patients Only)        Balance Overall balance assessment: Needs assistance   Sitting balance-Leahy Scale: Good       Standing balance-Leahy Scale: Fair                               Pertinent Vitals/Pain Pain Assessment: No/denies pain Pain Intervention(s): Monitored during session    Home Living Family/patient expects to be discharged to:: Private residence Living Arrangements: Spouse/significant other Available Help at Discharge: Family Type of Home: Apartment Home Access: Level entry(if walks around the building o/w 10 steps)     Home Layout: One level Home Equipment: Cane - single point;Walker - 4 wheels Additional Comments: Patient can either go up 10 steps or go around a long walkway.     Prior Function Level of Independence: Independent with assistive device(s)         Comments: uses rollator     Hand Dominance   Dominant Hand: Right    Extremity/Trunk Assessment   Upper Extremity Assessment Upper Extremity Assessment: Defer to OT evaluation    Lower Extremity Assessment Lower Extremity Assessment: Generalized weakness    Cervical / Trunk Assessment Cervical / Trunk Assessment: Normal  Communication   Communication: No difficulties  Cognition Arousal/Alertness: Awake/alert Behavior During Therapy: WFL for tasks assessed/performed Overall Cognitive Status: Within Functional Limits for tasks assessed  General Comments      Exercises     Assessment/Plan    PT Assessment Patient needs continued PT services  PT Problem List Decreased strength;Decreased activity tolerance;Decreased balance;Decreased mobility;Cardiopulmonary status limiting activity       PT Treatment Interventions DME instruction;Gait training;Functional mobility training;Therapeutic activities;Therapeutic exercise;Patient/family education    PT Goals (Current goals can be found in the Care Plan section)  Acute Rehab PT  Goals Patient Stated Goal: to go home,  we need an aide to help my wife. PT Goal Formulation: With patient Time For Goal Achievement: 12/18/18 Potential to Achieve Goals: Good    Frequency Min 3X/week   Barriers to discharge        Co-evaluation               AM-PAC PT "6 Clicks" Mobility  Outcome Measure Help needed turning from your back to your side while in a flat bed without using bedrails?: None Help needed moving from lying on your back to sitting on the side of a flat bed without using bedrails?: None Help needed moving to and from a bed to a chair (including a wheelchair)?: A Little Help needed standing up from a chair using your arms (e.g., wheelchair or bedside chair)?: A Little Help needed to walk in hospital room?: A Little Help needed climbing 3-5 steps with a railing? : A Little 6 Click Score: 20    End of Session   Activity Tolerance: Patient tolerated treatment well Patient left: in bed;with call bell/phone within reach Nurse Communication: Mobility status PT Visit Diagnosis: Unsteadiness on feet (R26.81);Other abnormalities of gait and mobility (R26.89);Muscle weakness (generalized) (M62.81)    Time: 7564-3329 PT Time Calculation (min) (ACUTE ONLY): 27 min   Charges:   PT Evaluation $PT Eval Moderate Complexity: 1 Mod PT Treatments $Gait Training: 8-22 mins        12/12/2018  Donnella Sham, PT Acute Rehabilitation Services [phone]  (pager) [phone]  (office)  Tessie Fass Larron Armor 12/12/2018, 6:03 PM

## 2018-12-12 NOTE — ED Notes (Signed)
Attempt to call report x 1.  Nurse will return call , currently in with a pt.

## 2018-12-12 NOTE — ED Notes (Signed)
ED TO INPATIENT HANDOFF REPORT  ED Nurse Name and Phone #: Celene Squibb RN  S Name/Age/Gender [name] 68 y.o. female Room/Bed: 019C/019C  Code Status   Code Status: Prior  Home/SNF/Other Home Patient oriented to: self, place, time and situation Is this baseline? Yes   Triage Complete: Triage complete  Chief Complaint SOB  Triage Note Pt reports at midnight she was trying to lay down and was not able to. Pt reports she got SOB when she laid down.    Allergies No Known Allergies  Level of Care/Admitting Diagnosis ED Disposition    ED Disposition Condition [facility]: [place] [[id number]]  Level of Care: Telemetry Cardiac [103]  Covid Evaluation: Person Under Investigation (PUI)  Isolation Risk Level: Low Risk/Droplet (Less than 4L [place] supplementation)  Diagnosis: Cardiorenal syndrome [[id number]]  Admitting Physician: Karmen Bongo [[id number]]  Attending Physician: Karmen Bongo [[id number]]  Estimated length of stay: 3 - 4 days  Certification:: I certify this patient will need inpatient services for at least 2 midnights  PT Class (Do Not Modify): Inpatient [101]  PT Acc Code (Do Not Modify): Private [1]       B Medical/Surgery History Past Medical History:  Diagnosis Date  . AKI (acute kidney injury) ([place]) 11/2018  . CHF (congestive heart failure) ([place])   . COPD (chronic obstructive pulmonary disease) ([place])   . Diabetes mellitus without complication ([place])   . Hypertension   . NSTEMI (non-ST elevated myocardial infarction) [facility])    Past Surgical History:  Procedure Laterality Date  . CORONARY STENT INTERVENTION N/A 08/10/2018   Procedure: CORONARY STENT INTERVENTION;  Surgeon: Leonie Man, MD;  Location: [facility];  Service: Cardiovascular;  Laterality: N/A;  . IR FLUORO GUIDE CV LINE RIGHT  12/04/2018  . IR US GUIDE VASC ACCESS RIGHT  12/04/2018  . RIGHT HEART CATH N/A 11/27/2018   Procedure: RIGHT HEART CATH;   Surgeon: Jolaine Artist, MD;  Location: [facility];  Service: Cardiovascular;  Laterality: N/A;  . RIGHT/LEFT HEART CATH AND CORONARY ANGIOGRAPHY N/A 08/10/2018   Procedure: RIGHT/LEFT HEART CATH AND CORONARY ANGIOGRAPHY;  Surgeon: Leonie Man, MD;  Location: [facility];  Service: Cardiovascular;  Laterality: N/A;     A IV Location/Drains/Wounds Patient Lines/Drains/Airways Status   Active Line/Drains/Airways    Name:   Placement date:   Placement time:   Site:   Days:   Peripheral IV 12/12/18 Right Forearm   12/12/18    0619    Forearm   less than 1   CVC Single Lumen 12/04/18 Right Internal jugular 23 cm   12/04/18    1443    Internal jugular   8          Intake/Output Last 24 hours No intake or output data in the 24 hours ending 12/12/18 0925  Labs/Imaging Results for orders placed or performed during the hospital encounter of 12/12/18 (from the past 48 hour(s))  CBC     Status: Abnormal   Collection Time: 12/12/18  6:39 AM  Result Value Ref Range   WBC 7.1 4.0 - 10.5 K/uL   RBC 3.81 (L) 3.87 - 5.11 MIL/uL   Hemoglobin 10.3 (L) 12.0 - 15.0 g/dL   HCT 34.5 (L) 36.0 - 46.0 %   MCV 90.6 80.0 - 100.0 fL   MCH 27.0 26.0 - 34.0 pg   MCHC 29.9 (L) 30.0 - 36.0 g/dL   RDW 18.9 (  H) 11.5 - 15.5 %   Platelets 472 (H) 150 - 400 K/uL   nRBC 0.0 0.0 - 0.2 %    Comment: Performed at [facility], [place] [address] [place], [place] [address]  Basic metabolic panel     Status: Abnormal   Collection Time: 12/12/18  6:39 AM  Result Value Ref Range   Sodium 137 135 - 145 mmol/L   Potassium 4.3 3.5 - 5.1 mmol/L   Chloride 102 98 - 111 mmol/L   CO2 19 (L) 22 - 32 mmol/L   Glucose, Bld 226 (H) 70 - 99 mg/dL   BUN 47 (H) 8 - 23 mg/dL   Creatinine, Ser 4.01 (H) 0.44 - 1.00 mg/dL   Calcium 9.6 8.9 - 10.3 mg/dL   GFR calc non Af Amer 11 (L) >60 mL/min   GFR calc Af Amer 12 (L) >60 mL/min   Anion gap 16 (H) 5 - 15    Comment: Performed at [facility],  [place] [address] [place], [place] [address]  Brain natriuretic peptide     Status: Abnormal   Collection Time: 12/12/18  6:39 AM  Result Value Ref Range   B Natriuretic Peptide 1,631.0 (H) 0.0 - 100.0 pg/mL    Comment: Performed at [facility] [address], [place], [place] [address]  Troponin I - ONCE - STAT     Status: Abnormal   Collection Time: 12/12/18  6:39 AM  Result Value Ref Range   Troponin I 0.04 (HH) <0.03 ng/mL    Comment: CRITICAL RESULT CALLED TO, READ BACK BY AND VERIFIED WITH: Kingsley Plan RN [phone] [id number] BY A BENNETT Performed at [facility], [place] [address] [place], [place] [address]    Dg Chest Portable 1 View  Result Date: 12/12/2018 CLINICAL DATA:  Shortness of breath. EXAM: PORTABLE CHEST 1 VIEW COMPARISON:  11/24/2018. FINDINGS: Central line noted with tip over the cavoatrial junction. Stable cardiomegaly. Progressive bilateral pulmonary infiltrates most consistent bilateral pulmonary edema noted. Bilateral pneumonia cannot be excluded. Persistent right pleural effusion. New left pleural effusion. These findings are most consistent with CHF. No pneumothorax. IMPRESSION: 1 central line noted with tip over cavoatrial junction. 2. Cardiomegaly with bilateral pulmonary infiltrates most consistent with bilateral pulmonary edema. Bilateral pneumonia cannot be excluded. Persistent right pleural effusion. New left pleural effusion. These findings together most consistent with CHF. Electronically Signed   By: Marcello Moores  Register   On: 12/12/2018 07:00    Pending Labs Unresulted Labs (From admission, onward)    Start     Ordered   12/12/18 0922  SARS Coronavirus 2 (CEPHEID- Performed in [place] hospital lab), Hosp Order  (Symptomatic Patients Labs with Precautions )  Once,   STAT     12/12/18 0922   12/12/18 0612  Novel Coronavirus,NAA,(SEND-OUT TO REF LAB - TAT 24-48 hrs); Hosp Order  (Asymptomatic Patients Labs)  ONCE - STAT,   STAT    Question:  Rule Out  Answer:   Yes   12/12/18 0611          Vitals/Pain Today's Vitals   12/12/18 0714 12/12/18 0730 12/12/18 0900 12/12/18 0908  BP:  121/89  114/75  Pulse:  (!) 115  (!) 104  Resp:  18  (!) 29  Temp:      TempSrc:      SpO2:  93%  99%  Weight:      Height:      PainSc: 0-No pain  0-No pain     Isolation Precautions Droplet and Contact precautions  Medications Medications  aspirin chewable tablet 324 mg (324 mg Oral Given 12/12/18 0713)  furosemide (LASIX) injection 60 mg (60 mg Intravenous Given 12/12/18 0920)    Mobility walks Low fall risk   Focused Assessments Cardiac Assessment Handoff:    Lab Results  Component Value Date   TROPONINI 0.04 ([place]) 12/12/2018   No results found for: DDIMER Does the Patient currently have chest pain? No     R Recommendations: See Admitting Provider Note  Report given to:   Additional Notes:

## 2018-12-12 NOTE — ED Triage Notes (Signed)
Pt reports at midnight she was trying to lay down and was not able to. Pt reports she got SOB when she laid down.

## 2018-12-12 NOTE — ED Notes (Signed)
Pt covid negative, will transport now.

## 2018-12-12 NOTE — H&P (Addendum)
History and Physical    [name] WJX:[id number] DOB: 1951-01-18 DOA: 12/12/2018  PCP: [name], MD Consultants:  Malak[place] - cardiology; Moshe Cipro - nephrology  Patient coming from:  Home - lives with wife; NOK: Wife  Chief Complaint: SOB  HPI: [name] is a 68 y.o. female with medical history significant of ofchronic systolic congestive heart failure with a EF of 20%, chronic kidney disease with baseline creatinine of 2, COPD, chronic respiratory failure on 1 to 2 L of nasal cannula only at night presenting with SOB.  She was discharged on 6/9.  She was going to bed about 12 and it seemed like every time she started to go to sleep, she would have trouble breathing.  About 6 they called the nephrology line "but they don't know anything  They just have a person for you to talk to.  So you won't be lonely, I guess."  Somehow, she got transferred to 911 and the nephrology person said she wanted to come to the hospital.  She did have trouble falling asleep a couple of other nights but was able to sleep those nights.  She does not usually wear home O2 - "but that might be a problem."  She was a little winded yesterday.  +cough, nonproductive.  No chest pain.  No fevers.    She was hospitalized from 5/29-6/9 with AKI on CKD thought to be related to low cardiac output in the setting of ARB and diuretic use.  She was started on dobutamine and has a right heart cath with moderate pulmonary HTN.  ED Course:  Recent d/c.  Worsening CHF, worsening renal failure.  Now with new 2L O2 requirement.  Unlikely COVID, sent 24 hour test.  Review of Systems: As per HPI; otherwise review of systems reviewed and negative.   Ambulatory Status:  Ambulates with a rolling walker when out of the house and a cane to get around in the house   Past Medical History:  Diagnosis Date  . AKI (acute kidney injury) ([place]) 11/2018  . CHF (congestive heart failure) ([place])   . COPD (chronic obstructive pulmonary  disease) ([place])   . Diabetes mellitus without complication ([place])   . Dyspnea   . Hypertension   . NSTEMI (non-ST elevated myocardial infarction) [facility])     Past Surgical History:  Procedure Laterality Date  . CORONARY STENT INTERVENTION N/A 08/10/2018   Procedure: CORONARY STENT INTERVENTION;  Surgeon: Leonie Man, MD;  Location: [facility] [facility];  Service: Cardiovascular;  Laterality: N/A;  . IR FLUORO GUIDE CV LINE RIGHT  12/04/2018  . IR US GUIDE VASC ACCESS RIGHT  12/04/2018  . RIGHT HEART CATH N/A 11/27/2018   Procedure: RIGHT HEART CATH;  Surgeon: Jolaine Artist, MD;  Location: [facility];  Service: Cardiovascular;  Laterality: N/A;  . RIGHT/LEFT HEART CATH AND CORONARY ANGIOGRAPHY N/A 08/10/2018   Procedure: RIGHT/LEFT HEART CATH AND CORONARY ANGIOGRAPHY;  Surgeon: Leonie Man, MD;  Location: [facility];  Service: Cardiovascular;  Laterality: N/A;    Social History   Socioeconomic History  . Marital status: Married    Spouse name: Not on file  . Number of children: Not on file  . Years of education: Not on file  . Highest education level: Not on file  Occupational History  . Occupation: retired  Scientific laboratory technician  . Financial resource strain: Not on file  . Food insecurity    Worry: Not on file    Inability:  Not on file  . Transportation needs    Medical: Not on file    Non-medical: Not on file  Tobacco Use  . Smoking status: Former Smoker    Packs/day: 1.00    Years: 55.00    Pack years: 55.00    Types: Cigarettes    Quit date: 07/29/2018    Years since quitting: 0.3  . Smokeless tobacco: Never Used  Substance and Sexual Activity  . Alcohol use: Not Currently    Frequency: Never    Comment: "I was drinking just as much as I smoked, can't do it anymore"  . Drug use: Never  . Sexual activity: Not on file  Lifestyle  . Physical activity    Days per week: Not on file    Minutes per session: Not on file  . Stress: Not on file  Relationships   . Social Herbalist on phone: Not on file    Gets together: Not on file    Attends religious service: Not on file    Active member of club or organization: Not on file    Attends meetings of clubs or organizations: Not on file    Relationship status: Not on file  . Intimate partner violence    Fear of current or ex partner: Not on file    Emotionally abused: Not on file    Physically abused: Not on file    Forced sexual activity: Not on file  Other Topics Concern  . Not on file  Social History Narrative  . Not on file    No Known Allergies  Family History  Problem Relation Age of Onset  . Breast cancer Neg Hx     Prior to Admission medications   Medication Sig Start Date End Date Taking? Authorizing Provider  aspirin 81 MG chewable tablet Chew 1 tablet (81 mg total) by mouth daily. 08/18/18   Swayze, Ava, DO  atorvastatin (LIPITOR) 40 MG tablet Take 1 tablet (40 mg total) by mouth daily at 6 PM. 08/17/18   Swayze, Ava, DO  docusate sodium (COLACE) 100 MG capsule Take 1 capsule (100 mg total) by mouth 2 (two) times daily. 12/05/18   Lavina Hamman, MD  furosemide (LASIX) 40 MG tablet Take 1 tablet (40 mg total) by mouth as directed. Lasix 40 mg Monday, Wednesday and Friday. Also take 1 tab as needed for weight gain of 3lbs in 1 day or 5lbs in 2 days 12/05/18   Lavina Hamman, MD  Magnesium Oxide 400 (240 Mg) MG TABS Take 1 tablet (400 mg total) by mouth daily. 11/10/18   Bensimhon, Shaune Pascal, MD  Nutritional Supplements (FEEDING SUPPLEMENT, NEPRO CARB STEADY,) LIQD Take 237 mLs by mouth 2 (two) times daily between meals. 12/05/18   Lavina Hamman, MD  nystatin (MYCOSTATIN) 100000 UNIT/ML suspension Use as directed 5 mLs (500,000 Units total) in the mouth or throat 4 (four) times daily for 7 days. 12/05/18 12/12/18  Lavina Hamman, MD  pantoprazole (PROTONIX) 40 MG tablet Take 1 tablet (40 mg total) by mouth daily. 08/18/18   Swayze, Ava, DO  potassium chloride (K-DUR) 10 MEQ tablet  Take 2 tablets (20 mEq total) by mouth 3 (three) times a week. With lasix. 12/06/18   Lavina Hamman, MD  ticagrelor (BRILINTA) 90 MG TABS tablet Take 1 tablet (90 mg total) by mouth 2 (two) times daily. 08/17/18   Swayze, Ava, DO    Physical Exam: Vitals:   12/12/18 1115  12/12/18 1130 12/12/18 1226 12/12/18 1637  BP: 125/86 (!) 113/92 113/87 103/87  Pulse: (!) 110 (!) 111 93 95  Resp: (!) 28 (!) 25 20 20   Temp:   97.8 F (36.6 C) (!) 97.4 F (36.3 C)  TempSrc:   Oral Oral  SpO2: 99% 96% 96% 99%  Weight:   47.1 kg   Height:   5\' 2"  (1.575 m)      . General:  Appears chronically ill and older than stated age . Eyes:  PERRL, EOMI, normal lids, iris . ENT:  grossly normal hearing, lips & tongue, mmm . Neck:  no LAD, masses or thyromegaly . Cardiovascular:  RR with mild tachycardia, no m/r/g. 2-3+ LE edema.  Central line in R upper chest with dobutamine infusion. Marland Kitchen Respiratory:   Bibasilar crackles.  Normal to mildly increased respiratory effort. . Abdomen:  soft, NT, ND, NABS . Back:   normal alignment, no CVAT . Skin:  no rash or induration seen on limited exam . Musculoskeletal:  grossly normal tone BUE/BLE, good ROM, no bony abnormality . Psychiatric:  grossly normal mood and affect, speech fluent and appropriate, AOx3 . Neurologic:  CN 2-12 grossly intact, moves all extremities in coordinated fashion, sensation intact    Radiological Exams on Admission: Dg Chest Portable 1 View  Result Date: 12/12/2018 CLINICAL DATA:  Shortness of breath. EXAM: PORTABLE CHEST 1 VIEW COMPARISON:  11/24/2018. FINDINGS: Central line noted with tip over the cavoatrial junction. Stable cardiomegaly. Progressive bilateral pulmonary infiltrates most consistent bilateral pulmonary edema noted. Bilateral pneumonia cannot be excluded. Persistent right pleural effusion. New left pleural effusion. These findings are most consistent with CHF. No pneumothorax. IMPRESSION: 1 central line noted with tip over  cavoatrial junction. 2. Cardiomegaly with bilateral pulmonary infiltrates most consistent with bilateral pulmonary edema. Bilateral pneumonia cannot be excluded. Persistent right pleural effusion. New left pleural effusion. These findings together most consistent with CHF. Electronically Signed   By: Marcello Moores  Register   On: 12/12/2018 07:00    EKG: Independently reviewed.  Sinus tachycardia with rate 108; nonspecific ST changes with no evidence of acute ischemia   Labs on Admission: I have personally reviewed the available labs and imaging studies at the time of the admission.  Pertinent labs:   CO2 19 Glucose 225 BUN 47/Creatinine 4.01/GFR 12; 43/3.54/14 on 6/9 - similar to presentation on 5/29 BNP 1631.0; 4046.9 on 5/29 Troponin 0.04 WBC 7.1 Hgb 10.3 Platelets 472 Send-out COVID testing is pending; negative on 5/29 and in-house test also negative today  Assessment/Plan Principal Problem:   Cardiorenal syndrome with renal failure Active Problems:   Diabetes mellitus (HCC)   CAD (coronary artery disease), native coronary artery   Acute on chronic systolic heart failure (HCC)   Acute renal failure with acute tubular necrosis superimposed on stage 4 chronic kidney disease (HCC)    Cardiorenal syndrome -Patient with h/o known CHF and renal insufficiency -She was recently admitted for the same and started on continuous dobutamine infusion after RHC showed moderately elevated filling pressures and concern for low output heart failure. -Prior admission in 2/20 with PEA arrest in the cath lab and subsequent DES placement to 80% mid-LAD obstruction with new ischemic cardiomyopathy to 20-25% -She had worsening SOB and cough overnight with CXR concerning for recurrent pulmonary edema -Given persistent and worsening cardiorenal failure, I am concerned that this is progressive disease and may be terminal -Cardiology has been consulted -After discussion, she is willing to have palliative care  consultation -She may  soon be a candidate for hospice  Chronic systolic CHF -Patient with worsening respiratory failure despite dobutamine -CXR with bilateral pulmonary edema -Elevated BNP -Will place in observation status with telemetry -CHF order set utilized -Will give Lasix 120 mg IV BID per Dr. Haroldine Laws and follow -Continue [place] O2  -She is unable to take a BB with her low output and ACE and other nephrotoxic medications are also not possible  AKI on Stage IV CKD -Baseline creatinine appears to be about 3.5 -Current creatinine is 4 -Will need to be closely monitored during diuresis  DM -Recent A1c was 9.3 -Based on her other life-threatening medical problems, she is unlikely to suffer additional long-term consequences associated with uncontrolled DM (she already is) -Short term goal is to avoid hypoglycemia or HHNK/DKA -For now will follow with fasting labs  CAD -Recent MI/stent -Continue ASA, Brilinta, Lipitor  Other -Based on the severity of her condition, I discussed code status with the patient and she is now DNR -Additionally, she is willing to consider palliative care and consultation has been ordered -She is likely to need a family meeting to help determine further goals of care  Note: This patient has been tested and is negative for the novel coronavirus COVID-19.   DVT prophylaxis: Lovenox  Code Status:  DNR - confirmed with patient Family Communication: None present Disposition Plan:  Home once clinically improved Consults called: Cardiology; Palliative care  Admission status: Admit - It is my clinical opinion that admission to INPATIENT is reasonable and necessary because this patient will require at least 2 midnights in the hospital to treat this condition based on the medical complexity of the problems presented.  Given the aforementioned information, the predictability of an adverse outcome is felt to be significant.     Karmen Bongo MD [facility]   How to contact the [facility] Attending or Consulting provider [place] or covering provider during after hours [place], for this patient?  1. Check the care team in [facility] and look for a) attending/consulting [facility] provider listed and b) the [facility] team listed 2. Log into [email or web address] and use [place]'s universal password to access. If you do not have the password, please contact the hospital operator. 3. Locate the [facility] provider you are looking for under [facility] and page to a number that you can be directly reached. 4. If you still have difficulty reaching the provider, please page the [facility] (Director on Call) for the Hospitalists listed on amion for assistance.   12/12/2018, 6:50 PM

## 2018-12-12 NOTE — ED Provider Notes (Signed)
Patient aware of all findings thus far including elevated BNP, worsening creatinine, need for admission. Have discussed her case with our hospitalist colleagues for admission.   Carmin Muskrat, MD 12/12/18 [phone]

## 2018-12-12 NOTE — ED Provider Notes (Signed)
TIME SEEN: 6:12 AM  CHIEF COMPLAINT: Shortness of breath  HPI: Patient is a 68 year old female with history of hypertension, diabetes, COPD, CHF, CAD with DES to mLAD 08/10/18 who presents to the emergency department with shortness of breath that started at midnight.  States it is worse when she is lying flat.  No chest pain or chest discomfort.  No lower extremity swelling or pain.  Reports she lives at home with her wife.  She has been compliant with her medications.  She denies any fevers or cough.  Does not wear oxygen at home.  She states her weight has been stable.  Just admitted to the hospital for CHF exacerbation and discharged on dobutamine.  Her cardiologist is Dr. Haroldine Laws.  11/25/18 Echo:  IMPRESSIONS    1. The left ventricle has severely reduced systolic function, with an ejection fraction of 25-30%. The cavity size was normal. Indeterminate diastolic filling due to E-A fusion.  2. LVEF is severely depressed at approximately 25% with hypokinesi of the mid LV; akinesis of the distal 1/3 of LV No significant change in LVEF from echo report from Feb 2020.  3. The right ventricle has normal systolic function. The cavity was normal. There is no increase in right ventricular wall thickness.  4. Left atrial size was mildly dilated.  5. Trivial pericardial effusion is present.  6. The mitral valve is abnormal. Mild thickening of the mitral valve leaflet.  7. The aortic valve is tricuspid. Mild thickening of the aortic valve. Mild calcification of the aortic valve.  8. The interatrial septum was not assessed.  ROS: See HPI Constitutional: no fever  Eyes: no drainage  ENT: no runny nose   Cardiovascular:  no chest pain  Resp: SOB  GI: no vomiting GU: no dysuria Integumentary: no rash  Allergy: no hives  Musculoskeletal: no leg swelling  Neurological: no slurred speech ROS otherwise negative  PAST MEDICAL HISTORY/PAST SURGICAL HISTORY:  Past Medical History:  Diagnosis Date   . AKI (acute kidney injury) ([place]) 11/2018  . CHF (congestive heart failure) ([place])   . COPD (chronic obstructive pulmonary disease) ([place])   . Diabetes mellitus without complication ([place])   . Hypertension   . NSTEMI (non-ST elevated myocardial infarction) [facility])     MEDICATIONS:  Prior to Admission medications   Medication Sig Start Date End Date Taking? Authorizing Provider  aspirin 81 MG chewable tablet Chew 1 tablet (81 mg total) by mouth daily. 08/18/18   Swayze, Ava, DO  atorvastatin (LIPITOR) 40 MG tablet Take 1 tablet (40 mg total) by mouth daily at 6 PM. 08/17/18   Swayze, Ava, DO  docusate sodium (COLACE) 100 MG capsule Take 1 capsule (100 mg total) by mouth 2 (two) times daily. 12/05/18   Lavina Hamman, MD  furosemide (LASIX) 40 MG tablet Take 1 tablet (40 mg total) by mouth as directed. Lasix 40 mg Monday, Wednesday and Friday. Also take 1 tab as needed for weight gain of 3lbs in 1 day or 5lbs in 2 days 12/05/18   Lavina Hamman, MD  Magnesium Oxide 400 (240 Mg) MG TABS Take 1 tablet (400 mg total) by mouth daily. 11/10/18   Bensimhon, Shaune Pascal, MD  Nutritional Supplements (FEEDING SUPPLEMENT, NEPRO CARB STEADY,) LIQD Take 237 mLs by mouth 2 (two) times daily between meals. 12/05/18   Lavina Hamman, MD  nystatin (MYCOSTATIN) 100000 UNIT/ML suspension Use as directed 5 mLs (500,000 Units total) in the mouth or throat 4 (four) times daily for  7 days. 12/05/18 12/12/18  Lavina Hamman, MD  pantoprazole (PROTONIX) 40 MG tablet Take 1 tablet (40 mg total) by mouth daily. 08/18/18   Swayze, Ava, DO  potassium chloride (K-DUR) 10 MEQ tablet Take 2 tablets (20 mEq total) by mouth 3 (three) times a week. With lasix. 12/06/18   Lavina Hamman, MD  ticagrelor (BRILINTA) 90 MG TABS tablet Take 1 tablet (90 mg total) by mouth 2 (two) times daily. 08/17/18   Swayze, Ava, DO    ALLERGIES:  No Known Allergies  SOCIAL HISTORY:  Social History   Tobacco Use  . Smoking status: Former Smoker     Packs/day: 1.00    Types: Cigarettes    Quit date: 07/29/2018    Years since quitting: 0.3  . Smokeless tobacco: Never Used  Substance Use Topics  . Alcohol use: Yes    Frequency: Never    Comment: 2-3 drinks per week     FAMILY HISTORY: Family History  Problem Relation Age of Onset  . Breast cancer Neg Hx     EXAM: BP 123/87 (BP Location: Left Arm)   Pulse (!) 115   Temp 98.7 F (37.1 C) (Oral)   Resp 18   Ht 5' (1.524 m)   Wt 49.9 kg   LMP  (Exact Date)   SpO2 (!) 87%   BMI 21.48 kg/m  CONSTITUTIONAL: Alert and oriented and responds appropriately to questions.  Elderly, thin, chronically ill-appearing HEAD: Normocephalic EYES: Conjunctivae clear, pupils appear equal, EOMI ENT: normal nose; moist mucous membranes NECK: Supple, no meningismus, no nuchal rigidity, no LAD  CARD: Regular and tachycardic; S1 and S2 appreciated; no murmurs, no clicks, no rubs, no gallops; tunneled PICC line in the right chest wall without surrounding redness or warmth or tenderness.  There is no bleeding or drainage from this area. RESP: Patient is mildly tachypneic.  She is hypoxic on room air to 87%.  Symptoms worse with lying flat.  No rhonchi, rales or wheezes.  No respiratory distress.  Speaking full sentences. ABD/GI: Normal bowel sounds; non-distended; soft, non-tender, no rebound, no guarding, no peritoneal signs, no hepatosplenomegaly BACK:  The back appears normal and is non-tender to palpation, there is no CVA tenderness EXT: Normal ROM in all joints; non-tender to palpation; no edema; normal capillary refill; no cyanosis, no calf tenderness or swelling    SKIN: Normal color for age and race; warm; no rash NEURO: Moves all extremities equally PSYCH: The patient's mood and manner are appropriate. Grooming and personal hygiene are appropriate.  MEDICAL DECISION MAKING: Patient here with shortness of breath.  Suspect CHF exacerbation.  Patient has an EF of 25 to 30%.  Patient was just  admitted to the hospital on May 29 and discharged June 9.  Patient was discharged on dobutamine drip through a PICC line.  She was also discharged on furosemide 40 mg on Monday, Wednesday and Friday which she reports compliance with.  She states she has not any changes in her weight.  Diagnosis includes ACS, CHF exacerbation, less likely COPD exacerbation given her lungs are clear with good aeration, PE, pneumonia.  Patient will need admission given she has a new oxygen requirement here.  Labs, chest x-ray pending.  EKG shows no new ischemic abnormality.  ED PROGRESS: Patient's chest x-ray appears volume overloaded compared to previous.  Labs are pending.  Signed out to oncoming ED physician to follow-up on patient's labs, order IV diuresis (once electrolytes reviewed) as appropriate and admit.  I  reviewed all nursing notes, vitals, pertinent previous records, EKGs, lab and urine results, imaging (as available).    EKG Interpretation  Date/Time:  Tuesday December 12 2018 06:10:04 EDT Ventricular Rate:  108 PR Interval:    QRS Duration: 84 QT Interval:  351 QTC Calculation: 471 R Axis:     Text Interpretation:  Sinus tachycardia Multiple premature complexes, vent & supraven Borderline low voltage, extremity leads LVH with secondary repolarization abnormality No significant change since last tracing other than rate is faster Confirmed by Izabella Marcantel, Cyril Mourning [phone]) on 12/12/2018 6:13:16 AM         Maikol Grassia, Delice Bison, DO 12/12/18 [id number]

## 2018-12-12 NOTE — ED Notes (Signed)
Clarified with MD regarding additional covid swab.  Reports she would like another test collected.

## 2018-12-12 NOTE — ED Notes (Signed)
Admitting MD at bedside.

## 2018-12-12 NOTE — ED Notes (Signed)
Waiting for current swab results prior to admission.   Floor called with update.  Pt restful without needs at this time.

## 2018-12-13 DIAGNOSIS — R0902 Hypoxemia: Secondary | ICD-10-CM

## 2018-12-13 DIAGNOSIS — I1310 Hypertensive heart and chronic kidney disease without heart failure, with stage 1 through stage 4 chronic kidney disease, or unspecified chronic kidney disease: Secondary | ICD-10-CM

## 2018-12-13 DIAGNOSIS — J9601 Acute respiratory failure with hypoxia: Secondary | ICD-10-CM

## 2018-12-13 DIAGNOSIS — E1122 Type 2 diabetes mellitus with diabetic chronic kidney disease: Secondary | ICD-10-CM

## 2018-12-13 DIAGNOSIS — R0602 Shortness of breath: Secondary | ICD-10-CM

## 2018-12-13 DIAGNOSIS — N189 Chronic kidney disease, unspecified: Secondary | ICD-10-CM

## 2018-12-13 DIAGNOSIS — R0601 Orthopnea: Secondary | ICD-10-CM

## 2018-12-13 DIAGNOSIS — N184 Chronic kidney disease, stage 4 (severe): Secondary | ICD-10-CM

## 2018-12-13 DIAGNOSIS — N179 Acute kidney failure, unspecified: Secondary | ICD-10-CM

## 2018-12-13 DIAGNOSIS — N183 Chronic kidney disease, stage 3 (moderate): Secondary | ICD-10-CM

## 2018-12-13 LAB — CBC WITH DIFFERENTIAL/PLATELET
Abs Immature Granulocytes: 0.04 10*3/uL (ref 0.00–0.07)
Basophils Absolute: 0.1 10*3/uL (ref 0.0–0.1)
Basophils Relative: 1 %
Eosinophils Absolute: 0 10*3/uL (ref 0.0–0.5)
Eosinophils Relative: 0 %
HCT: 31.4 % — ABNORMAL LOW (ref 36.0–46.0)
Hemoglobin: 9.7 g/dL — ABNORMAL LOW (ref 12.0–15.0)
Immature Granulocytes: 1 %
Lymphocytes Relative: 8 %
Lymphs Abs: 0.6 10*3/uL — ABNORMAL LOW (ref 0.7–4.0)
MCH: 27 pg (ref 26.0–34.0)
MCHC: 30.9 g/dL (ref 30.0–36.0)
MCV: 87.5 fL (ref 80.0–100.0)
Monocytes Absolute: 0.4 10*3/uL (ref 0.1–1.0)
Monocytes Relative: 6 %
Neutro Abs: 6.1 10*3/uL (ref 1.7–7.7)
Neutrophils Relative %: 84 %
Platelets: 458 10*3/uL — ABNORMAL HIGH (ref 150–400)
RBC: 3.59 MIL/uL — ABNORMAL LOW (ref 3.87–5.11)
RDW: 18.3 % — ABNORMAL HIGH (ref 11.5–15.5)
WBC: 7.2 10*3/uL (ref 4.0–10.5)
nRBC: 0 % (ref 0.0–0.2)

## 2018-12-13 LAB — BASIC METABOLIC PANEL
Anion gap: 19 — ABNORMAL HIGH (ref 5–15)
BUN: 47 mg/dL — ABNORMAL HIGH (ref 8–23)
CO2: 18 mmol/L — ABNORMAL LOW (ref 22–32)
Calcium: 9.8 mg/dL (ref 8.9–10.3)
Chloride: 101 mmol/L (ref 98–111)
Creatinine, Ser: 3.92 mg/dL — ABNORMAL HIGH (ref 0.44–1.00)
GFR calc Af Amer: 13 mL/min — ABNORMAL LOW (ref >60)
GFR calc non Af Amer: 11 mL/min — ABNORMAL LOW (ref >60)
Glucose, Bld: 253 mg/dL — ABNORMAL HIGH (ref 70–99)
Potassium: 4 mmol/L (ref 3.5–5.1)
Sodium: 138 mmol/L (ref 135–145)

## 2018-12-13 LAB — GLUCOSE, CAPILLARY: Glucose-Capillary: 214 mg/dL — ABNORMAL HIGH (ref 70–99)

## 2018-12-13 LAB — NOVEL CORONAVIRUS, NAA (HOSP ORDER, SEND-OUT TO REF LAB; TAT 18-24 HRS): SARS-CoV-2, NAA: NOT DETECTED

## 2018-12-13 LAB — COOXEMETRY PANEL
Carboxyhemoglobin: 1 % (ref 0.5–1.5)
Methemoglobin: 1 % (ref 0.0–1.5)
O2 Saturation: 23.2 %
Total hemoglobin: 10.6 g/dL — ABNORMAL LOW (ref 12.0–16.0)

## 2018-12-13 MED ORDER — HYDROMORPHONE HCL 1 MG/ML IJ SOLN: 0.2500 mg | INTRAMUSCULAR | Status: DC | PRN

## 2018-12-13 MED ORDER — INSULIN ASPART 100 UNIT/ML ~~LOC~~ SOLN
0.0000 [IU] | Freq: Three times a day (TID) | SUBCUTANEOUS | Status: DC
  Administered 2018-12-14: 1 [IU] via SUBCUTANEOUS
  Administered 2018-12-14 – 2018-12-15 (×3): 2 [IU] via SUBCUTANEOUS
  Administered 2018-12-15: 1 [IU] via SUBCUTANEOUS
  Administered 2018-12-15: 3 [IU] via SUBCUTANEOUS
  Administered 2018-12-16 (×2): 2 [IU] via SUBCUTANEOUS

## 2018-12-13 MED ORDER — ALUM & MAG HYDROXIDE-SIMETH 200-200-20 MG/5ML PO SUSP
30.0000 mL | ORAL | Status: DC | PRN
  Administered 2018-12-13: 30 mL via ORAL
  Filled 2018-12-13: qty 30

## 2018-12-13 MED ORDER — SODIUM CHLORIDE 0.9% FLUSH
10.0000 mL | INTRAVENOUS | Status: DC | PRN
  Administered 2018-12-18: 10 mL
  Filled 2018-12-13: qty 40

## 2018-12-13 MED ORDER — PANTOPRAZOLE SODIUM 40 MG PO TBEC
40.0000 mg | DELAYED_RELEASE_TABLET | Freq: Every day | ORAL | Status: DC
  Administered 2018-12-13 – 2018-12-18 (×6): 40 mg via ORAL
  Filled 2018-12-13 (×6): qty 1

## 2018-12-13 MED ORDER — TICAGRELOR 90 MG PO TABS: 90.0000 mg | ORAL_TABLET | Freq: Two times a day (BID) | ORAL | Status: DC

## 2018-12-13 MED ORDER — MAGNESIUM OXIDE 400 (241.3 MG) MG PO TABS
400.0000 mg | ORAL_TABLET | Freq: Every day | ORAL | Status: DC
  Administered 2018-12-13 – 2018-12-17 (×5): 400 mg via ORAL
  Filled 2018-12-13 (×5): qty 1

## 2018-12-13 MED ORDER — ATORVASTATIN CALCIUM 40 MG PO TABS: 40.0000 mg | ORAL_TABLET | Freq: Every day | ORAL | Status: DC

## 2018-12-13 MED ORDER — ASPIRIN 81 MG PO CHEW: 81.0000 mg | CHEWABLE_TABLET | Freq: Every day | ORAL | Status: DC

## 2018-12-13 MED ORDER — NEPRO/CARBSTEADY PO LIQD
237.0000 mL | Freq: Two times a day (BID) | ORAL | Status: DC
  Administered 2018-12-14 – 2018-12-17 (×7): 237 mL via ORAL

## 2018-12-13 NOTE — Plan of Care (Signed)
°  Problem: Education: °Goal: Ability to demonstrate management of disease process will improve °Outcome: Progressing °Goal: Ability to verbalize understanding of medication therapies will improve °Outcome: Progressing °Goal: Individualized Educational Video(s) °Outcome: Progressing °  °

## 2018-12-13 NOTE — Progress Notes (Signed)
Daily Progress Note   Patient Name: [name]       Date: 12/13/2018 DOB: August 02, 1950  Age: 68 y.o. MRN#: [id number] Attending Physician: Jean Jansky, MD Primary Care Physician: Jean Landsman, MD Admit Date: 12/12/2018  Reason for Consultation/Follow-up: Establishing goals of care, Hospice Evaluation and Psychosocial/spiritual support  Subjective: Met with the patient at bedside this am.  She reports sleeping better last night and breathing better overall.  We talked about death.  "I'm absolutely not afraid of death.  I am a [place] and look forward to going to my eternal reward, but I'm not ready to go yet.  I want to go dancing!"  Communication with Dr. Haroldine Wilson clearly indicate that the patient is declining rapidly and there is likely nothing left to turn the situation around.  Hospice House is recommended.   I spoke with the patient's wife, [name] on the phone.  As gently as I could I explained the situation and the recommendation for hospice house.  [name] was unfortunately surprised by the severity of [name]'s illness.  [name] asked me not to talk to [facility] about [facility] or dying until she could be present.  She requested that she and [name]'s sisters be allowed to come into the hospital for a discussion with PMT and Chai tomorrow morning at 10:00.     Assessment: Patient reports feeling better.  Medical work up indicates that she is nearing end of life.   Patient Profile/HPI:  68 y.o. female  with past medical history of CAD, CHF (EF 20-25%), COPD, PEA arrest, CKD stg V, who was admitted on 12/12/2018 with increase SOB.  She has had multiple admissions recently for heart failure and was discharged on 12/05/2018 on dobutamine and IV lasix in the home.  Initial  work up today reveals a BNP of 1631, bilateral pulmonary edema on CXR, and GFR of less than 12.  Length of Stay: 1  Current Medications: Scheduled Meds:  . aspirin EC  81 mg Oral Daily  . atorvastatin  40 mg Oral q1800  . heparin  5,000 Units Subcutaneous Q8H  . insulin aspart  0-9 Units Subcutaneous TID WC  . sodium chloride flush  3 mL Intravenous Q12H  . ticagrelor  90 mg Oral BID    Continuous Infusions: . sodium chloride    .  DOBUTamine 2.5 mcg/kg/min (12/12/18 1519)  . furosemide 120 mg (12/13/18 0910)    PRN Meds: sodium chloride, acetaminophen, ondansetron (ZOFRAN) IV, sodium chloride flush, sodium chloride flush, white petrolatum  Physical Exam        Thin frail elderly female, awake, alert, coherent cooperative. NAD.  Vital Signs: BP 103/71 (BP Location: Right Arm)   Pulse (!) 103   Temp (!) 97.4 F (36.3 C) (Oral)   Resp 16   Ht _0  (1.575 m)   Wt 49.6 kg   LMP  (Exact Date)   SpO2 98%   BMI 19.99 kg/m  SpO2: SpO2: 98 % O2 Device: O2 Device: Nasal Cannula O2 Flow Rate: O2 Flow Rate (L/min): 2 L/min  Intake/output summary:   Intake/Output Summary (Last 24 hours) at 12/13/2018 1316 Last data filed at 12/13/2018 1103 Gross per 24 hour  Intake 742.64 ml  Output 1201 ml  Net -458.36 ml   LBM: Last BM Date: 12/12/18 Baseline Weight: Weight: 49.9 kg Most recent weight: Weight: 49.6 kg       Palliative Assessment/Data: 40%      Patient Active Problem List   Diagnosis Date Noted  . Cardiorenal syndrome with renal failure 12/12/2018  . Acute on chronic systolic heart failure ([place])   . Acute renal failure with acute tubular necrosis superimposed on stage 4 chronic kidney disease ([place])   . Palliative care encounter   . COPD (chronic obstructive pulmonary disease) ([place]) 11/25/2018  . Pulmonary HTN ([place]) 11/25/2018  . AKI (acute kidney injury) ([place]) 11/24/2018  . Chronic systolic CHF (congestive heart failure) ([place]) 11/24/2018  . Malnutrition of  moderate degree 09/19/2018  . Acute on chronic systolic (congestive) heart failure ([place]) 09/16/2018  . Acute on chronic respiratory failure with hypoxia ([place]) 09/16/2018  . CKD (chronic kidney disease), stage III ([place]) 09/16/2018  . CAD (coronary artery disease), native coronary artery 09/16/2018  . NSTEMI (non-ST elevated myocardial infarction) ([place]) 08/06/2018  . Hypokalemia 08/06/2018  . Diabetes mellitus ([place]) 08/06/2018  . NSTEMI, initial episode of care [facility]) 08/06/2018    Palliative Care Plan    Recommendations/Plan:  Wife and sisters to come in in AM for [place] discussion with patient and myself.   [place]   Are the 3 people coming in for the [employer].  Please continue current care for now.    Anticipate Inpatient Hospice referral tomorrow morning. (to [facility])  Code Status:  DNR  Prognosis:   Hours - Days   Discharge Planning: Hospice facility  If patient and family are in agreement.    Care plan was discussed with HF team and family.  Thank you for allowing the Palliative Medicine Team to assist in the care of this patient.  Total time spent:  35 min.     Greater than 50%  of this time was spent counseling and coordinating care related to the above assessment and plan.  Florentina Jenny, PA-C Palliative Medicine  Please contact Palliative MedicineTeam phone at [phone] for questions and concerns between 7 am - 7 pm.   Please see AMION for individual provider pager numbers.

## 2018-12-13 NOTE — Progress Notes (Signed)
Advanced Heart Failure Rounding Note   Subjective:    Remains on dobutamine 2.5. Remains weak. SOB improved from yesterday.  Had 1.2L out with IV lasix but weight unchanged  Co-ox 35% from yesterday. Labs pending this am.    Objective:   Weight Range:  Vital Signs:   Temp:  [97.4 F (36.3 C)-98.3 F (36.8 C)] 97.7 F (36.5 C) (06/17 0418) Pulse Rate:  [93-114] 102 (06/17 0418) Resp:  [16-32] 20 (06/17 0418) BP: (103-125)/(68-92) 107/70 (06/17 0418) SpO2:  [90 %-100 %] 90 % (06/17 0418) Weight:  [47.1 kg-49.6 kg] 49.6 kg (06/17 0105)    Weight change: Filed Weights   12/12/18 0608 12/12/18 1226 12/13/18 0105  Weight: 49.9 kg 47.1 kg 49.6 kg    Intake/Output:   Intake/Output Summary (Last 24 hours) at 12/13/2018 0804 Last data filed at 12/13/2018 0158 Gross per 24 hour  Intake 382.64 ml  Output 1201 ml  Net -818.36 ml     Physical Exam: General:  Weak appearing. Sitting up in bed No resp difficulty HEENT: normal Neck: supple. JVP 9 . Carotids 2+ bilat; no bruits. No lymphadenopathy or thryomegaly appreciated. Cor: PMI nondisplaced. Tachy regular + s3 Lungs: clear with decreased BS throughout Abdomen: soft, nontender, + distended. No hepatosplenomegaly. No bruits or masses. Good bowel sounds. Extremities: no cyanosis, clubbing, rash, edema Neuro: alert & orientedx3, cranial nerves grossly intact. moves all 4 extremities w/o difficulty. Affect pleasant  Telemetry: Sinus tach 100-110 Personally reviewed   Labs: Basic Metabolic Panel: Recent Labs  Lab 12/12/18 0639  NA 137  K 4.3  CL 102  CO2 19*  GLUCOSE 226*  BUN 47*  CREATININE 4.01*  CALCIUM 9.6    Liver Function Tests: No results for input(s): AST, ALT, ALKPHOS, BILITOT, PROT, ALBUMIN in the last 168 hours. No results for input(s): LIPASE, AMYLASE in the last 168 hours. No results for input(s): AMMONIA in the last 168 hours.  CBC: Recent Labs  Lab 12/12/18 0639 12/13/18 0648  WBC 7.1  7.2  NEUTROABS  --  6.1  HGB 10.3* 9.7*  HCT 34.5* 31.4*  MCV 90.6 87.5  PLT 472* 458*    Cardiac Enzymes: Recent Labs  Lab 12/12/18 0639  TROPONINI 0.04*    BNP: BNP (last 3 results) Recent Labs    09/16/18 0342 11/24/18 1715 12/12/18 0639  BNP 1,254.7* 4,046.9* 1,631.0*    ProBNP (last 3 results) No results for input(s): PROBNP in the last 8760 hours.    Other results:  Imaging: Dg Chest Portable 1 View  Result Date: 12/12/2018 CLINICAL DATA:  Shortness of breath. EXAM: PORTABLE CHEST 1 VIEW COMPARISON:  11/24/2018. FINDINGS: Central line noted with tip over the cavoatrial junction. Stable cardiomegaly. Progressive bilateral pulmonary infiltrates most consistent bilateral pulmonary edema noted. Bilateral pneumonia cannot be excluded. Persistent right pleural effusion. New left pleural effusion. These findings are most consistent with CHF. No pneumothorax. IMPRESSION: 1 central line noted with tip over cavoatrial junction. 2. Cardiomegaly with bilateral pulmonary infiltrates most consistent with bilateral pulmonary edema. Bilateral pneumonia cannot be excluded. Persistent right pleural effusion. New left pleural effusion. These findings together most consistent with CHF. Electronically Signed   By: Marcello Moores  Register   On: 12/12/2018 07:00      Medications:     Scheduled Medications: . aspirin EC  81 mg Oral Daily  . atorvastatin  40 mg Oral q1800  . heparin  5,000 Units Subcutaneous Q8H  . pneumococcal 23 valent vaccine  0.5 mL Intramuscular  Tomorrow-1000  . sodium chloride flush  3 mL Intravenous Q12H  . ticagrelor  90 mg Oral BID     Infusions: . sodium chloride    . DOBUTamine 2.5 mcg/kg/min (12/12/18 1519)  . furosemide 120 mg (12/12/18 1942)     PRN Medications:  sodium chloride, acetaminophen, ondansetron (ZOFRAN) IV, sodium chloride flush, white petrolatum   Assessment/Plan:   1. A/C Systolic HF, suspect ICM/NICM. ECHO 10/2018 Ef 25-30% .  -  On dobutamine 2.5 mcg at home.  - She has end-stage HF and advanced CKD now with worsening HF symptoms and volume overload despite dobutamine support. Co-ox very low. Will repeat. If still < 50% then Hospice really the only option. She has been seen by Palliative Care (thank you) and family discussions ongoing - Continue attempts at diuresis for comfort  2. AKI on CKD Stage IV.  - Creatinine she was discharged on 6/9 was 3.5 but creatinine 4.0 on admit - Cardiorenal - Await BMET  3. CAD  S/P NSTEMI  -08/10/18 LHC with DES 80% mLAD -Restart ticagrelor + asa 81 mg daily + atorvastatin 40 mg daily.   4.DNR - Palliative Care consulted. See discussion above   Length of Stay: 1   Glori Bickers MD 12/13/2018, 8:04 AM  Advanced Heart Failure Team Pager [phone] (M-F; [place])  Please contact [facility] for night-coverage after hours (4p -7a ) and weekends on [email or web address]

## 2018-12-13 NOTE — Progress Notes (Addendum)
Daily Progress Note   Patient Name: [name]       Date: 12/13/2018 DOB: [birth year]  Age: 68 y.o. MRN#: [id number] Attending Physician: Modena Jansky, MD Primary Care Physician: [name], MD Admit Date: 12/12/2018  Reason for Consultation/Follow-up: Establishing goals of care and Psychosocial/spiritual support  Subjective: Meeting held with patient, wife, 2 sisters in law, and brother Mallie Mussel.  Patient asks me to call her "[name]". We discussed her combination heart and kidney disease and the fact that she is nearing end of life.  [name] nodded in agreement.    I explained that the doctors would like to monitor her condition for a couple of days to see if the medications will buy her more time.  However, even if the doctors tuned her up as much as possible and send her home once again with IV medications - she would likely be back in the hospital very soon.  She is in a cycle of needed to return to the hospital more and more frequently.   We talked about engaging hospice care when the IV medications were no longer effective.  [place] vs Hospice services in their home.  The family felt strongly that they wanted [name] to have the best support and care possible at end of life - and they could not provide that at home.   They unanimously agree that it makes more sense for her to go directly from the hospital to [facility].   Assessment: Very pleasant 68 yo female with end stage renal and cardiac disease.   Patient Profile/HPI:  68 y.o. female  with past medical history of CAD, CHF (EF 20-25%), COPD, PEA arrest, CKD stg V, who was admitted on 12/12/2018 with increase SOB.  She has had multiple admissions recently for heart failure and was discharged on 12/05/2018 on  dobutamine and IV lasix in the home.  Initial work up today reveals a BNP of 1631, bilateral pulmonary edema on CXR, and GFR of less than 12.  Length of Stay: 1  Current Medications: Scheduled Meds:   aspirin EC  81 mg Oral Daily   atorvastatin  40 mg Oral q1800   heparin  5,000 Units Subcutaneous Q8H   sodium chloride flush  3 mL Intravenous Q12H   ticagrelor  90 mg Oral BID  Continuous Infusions:  sodium chloride     DOBUTamine 2.5 mcg/kg/min (12/12/18 1519)   furosemide 120 mg (12/13/18 0910)    PRN Meds: sodium chloride, acetaminophen, ondansetron (ZOFRAN) IV, sodium chloride flush, sodium chloride flush, white petrolatum  Physical Exam       Thin female, awake alert, coherent cooperative, NAD   Vital Signs: BP 104/85 (BP Location: Left Arm)    Pulse (!) 109    Temp 97.8 F (36.6 C) (Oral)    Resp 20    Ht 5\' 2"  (1.575 m)    Wt 49.6 kg    LMP  (Exact Date)    SpO2 98%    BMI 19.99 kg/m  SpO2: SpO2: 98 % O2 Device: O2 Device: Nasal Cannula O2 Flow Rate: O2 Flow Rate (L/min): 2 L/min  Intake/output summary:   Intake/Output Summary (Last 24 hours) at 12/13/2018 1043 Last data filed at 12/13/2018 0158 Gross per 24 hour  Intake 382.64 ml  Output 1201 ml  Net -818.36 ml   LBM:   Baseline Weight: Weight: 49.9 kg Most recent weight: Weight: 49.6 kg       Palliative Assessment/Data:      Patient Active Problem List   Diagnosis Date Noted   Cardiorenal syndrome with renal failure 12/12/2018   Acute on chronic systolic heart failure (HCC)    Acute renal failure with acute tubular necrosis superimposed on stage 4 chronic kidney disease (HCC)    Palliative care encounter    COPD (chronic obstructive pulmonary disease) ([place]) 11/25/2018   Pulmonary HTN ([place]) 11/25/2018   AKI (acute kidney injury) ([place]) 48/18/5631   Chronic systolic CHF (congestive heart failure) ([place]) 11/24/2018   Malnutrition of moderate degree 09/19/2018   Acute on chronic  systolic (congestive) heart failure ([place]) 09/16/2018   Acute on chronic respiratory failure with hypoxia ([place]) 09/16/2018   CKD (chronic kidney disease), stage III ([place]) 09/16/2018   CAD (coronary artery disease), native coronary artery 09/16/2018   NSTEMI (non-ST elevated myocardial infarction) ([place]) 08/06/2018   Hypokalemia 08/06/2018   Diabetes mellitus ([place]) 08/06/2018   NSTEMI, initial episode of care [facility]) 08/06/2018    Palliative Care Plan    Recommendations/Plan:  When MDs feel that continued medications at home can not buy her weeks to months, then patient would like to be discharged to [facility]).  If the patient discharges to home on IV medications please ensure she has close follow up by Palliative Care outpatient ([facility]) as I'm concerned she will need Hospice urgently when she declines.  I will be off for 5 days but the PMT team is available for support or to answer any questions.  [phone]   Goals of Care and Additional Recommendations:  Limitations on Scope of Treatment: Full Scope Treatment  Code Status:  DNR  Prognosis:  Less than 2 weeks after a shift to comfort measures only due to end stage cardiac and renal disease.  Discharge Planning:  To Be Determined  Hospice House vs Home with Palliative (if she can be kept out of the hospital and stable for weeks to months)  Care plan was discussed with MD team, family.  Thank you for allowing the Palliative Medicine Team to assist in the care of this patient.  Total time spent:  60 min.     Greater than 50%  of this time was spent counseling and coordinating care related to the above assessment and plan.  Florentina Jenny, PA-C Palliative Medicine  Please contact Palliative MedicineTeam phone  at [phone] for questions and concerns between 7 am - 7 pm.   Please see AMION for individual provider pager numbers.

## 2018-12-13 NOTE — Progress Notes (Signed)
PROGRESS NOTE   [name]  ZOX:[id number]    DOB: [birth year]    DOA: 12/12/2018  PCP: [name], MD   I have briefly reviewed patients previous medical records in [facility].  Brief Narrative:  68 year old female, lives with her wife, mobile with the help of a cane or walker, PMH of DM 2, HTN, COPD not on home oxygen, PEA arrest 08/07/2018 in Cath Lab, CAD s/p DES to mid LAD 02/05/9146, chronic systolic CHF on home dobutamine infusion, stage IV chronic kidney disease, multiple hospitalizations this year related to CHF and related complications, presented to ED on 12/12/2018 due to progressive dyspnea and cough.  Admitted for acute on chronic systolic CHF.  Advanced HF team consulted.  PMT consulted for goals of care.   Assessment & Plan:   Principal Problem:   Cardiorenal syndrome with renal failure Active Problems:   Diabetes mellitus (HCC)   CAD (coronary artery disease), native coronary artery   Acute on chronic systolic heart failure (HCC)   Acute renal failure with acute tubular necrosis superimposed on stage 4 chronic kidney disease ([place])   Acute on chronic systolic CHF/suspected [facility]  Cardiology consultation appreciated.  Indicate end-stage HF in context of advanced CKD.  TTE 11/25/2018: LVEF 25-30% with HK of mid LV, akinesis of distal 1/3 of LV  Clinical and radiological CHF flare.  PTA was on Lasix 40 mg on MWF  Started on high-dose IV Lasix 120 mg twice daily and metolazone added.  Recently discharged on metolazone 2.5 mcg/kg/min, continuing same  No beta-blockers due to low output heart failure.  No ACEI/ARB/Entresto/spironolactone or digoxin due to advanced CKD.  UO 1200 mL yesterday and -818 mL thus far.  No significant change in weight.  PMT input regarding goals of care appreciated.  Overall poor prognosis.  Acute kidney injury on stage IV chronic kidney disease  Creatinine in early June ranging between 3.4-3.6  Presented with creatinine of 4.01,  suspect cardiorenal worsening her CKD.  Continue diuresis as noted above.  Creatinine has slightly improved to 3.92.  Not a long-term HD candidate.  CAD s/p NSTEMI  LHC with DES to mid LAD 08/10/2018.  No ACS.  Continue aspirin, ticagrelor and atorvastatin.  Acute respiratory failure with hypoxia  Due to decompensated CHF.  Oxygen supplementation as needed and wean oxygen as tolerated for oxygen saturations >92%.  Type II DM with renal complications  Recent W2N 9.3.  Monitor CBGs and SSI if needed.  Adult failure to thrive  Due to severe and irreversible complex medical conditions as noted above.  Patient DNR this admission.  She was agreeable to PMT consultation and their input appreciated.  PMT will follow-up again.  Anemia in CKD  Stable.   DVT prophylaxis: Subcutaneous heparin Code Status: DNR Family Communication: None at bedside Disposition: To be determined pending clinical improvement.   Consultants:  Cardiology PMT  Procedures:  None.  Patient has right IJ CVL from prior to this admission.  Antimicrobials:  None.   Subjective: Patient reports that her dyspnea is better but dyspnea has not resolved.  No chest pain reported.  Unable to tell regarding orthopnea.  Indicates that she lives at home with her wife, ambulates with the help of a cane or walker and does not use home oxygen PTA.  ROS: As above, otherwise negative.  Objective:  Vitals:   12/13/18 0105 12/13/18 0109 12/13/18 0418 12/13/18 0913  BP:  107/73 107/70 104/85  Pulse:  (!) 108 (!) 102 (!) 109  Resp:  20 20 20   Temp:  97.7 F (36.5 C) 97.7 F (36.5 C) 97.8 F (36.6 C)  TempSrc:  Oral Oral Oral  SpO2:  96% 90% 98%  Weight: 49.6 kg     Height:        Examination:  General exam: Pleasant elderly female, small built and frail, chronically ill looking, moving from bedside commode to her bed and noted to be dyspneic on minimal exertion. Respiratory system: Reduced breath  sounds in the bases with few fine bibasilar crackles.  Rest of lung fields clear to auscultation. Respiratory effort normal. Cardiovascular system: S1 & S2 heard, RRR.  JVD +.  S3 gallop +.  No pedal edema or murmurs appreciated. Gastrointestinal system: Abdomen is nondistended, soft and nontender. No organomegaly or masses felt. Normal bowel sounds heard. Central nervous system: Alert and oriented. No focal neurological deficits. Extremities: Symmetric 5 x 5 power. Skin: No rashes, lesions or ulcers Psychiatry: Judgement and insight appear normal. Mood & affect appropriate.     Data Reviewed: I have personally reviewed following labs and imaging studies  CBC: Recent Labs  Lab 12/12/18 0639 12/13/18 0648  WBC 7.1 7.2  NEUTROABS  --  6.1  HGB 10.3* 9.7*  HCT 34.5* 31.4*  MCV 90.6 87.5  PLT 472* 361*   Basic Metabolic Panel: Recent Labs  Lab 12/12/18 0639 12/13/18 0648  NA 137 138  K 4.3 4.0  CL 102 101  CO2 19* 18*  GLUCOSE 226* 253*  BUN 47* 47*  CREATININE 4.01* 3.92*  CALCIUM 9.6 9.8   Cardiac Enzymes: Recent Labs  Lab 12/12/18 0639  TROPONINI 0.04*   HbA1C: No results for input(s): HGBA1C in the last 72 hours. CBG: No results for input(s): GLUCAP in the last 168 hours.  Recent Results (from the past 240 hour(s))  Novel Coronavirus,NAA,(SEND-OUT TO REF LAB - TAT 24-48 hrs); Hosp Order     Status: None   Collection Time: 12/12/18  6:18 AM   Specimen: Nasopharyngeal Swab; Respiratory  Result Value Ref Range Status   SARS-CoV-2, NAA NOT DETECTED NOT DETECTED Final    Comment: (NOTE) This test was developed and its performance characteristics determined by [employer]. This test has not been FDA cleared or approved. This test has been authorized by FDA under an Emergency Use Authorization (EUA). This test is only authorized for the duration of time the declaration that circumstances exist justifying the authorization of the emergency use of in vitro  diagnostic tests for detection of SARS-CoV-2 virus and/or diagnosis of COVID-19 infection under section 564(b)(1) of the Act, 21 U.S.C. 443XVQ-0(G)(8), unless the authorization is terminated or revoked sooner. When diagnostic testing is negative, the possibility of a false negative result should be considered in the context of a patient's recent exposures and the presence of clinical signs and symptoms consistent with COVID-19. An individual without symptoms of COVID-19 and who is not shedding SARS-CoV-2 virus would expect to have a negative (not detected) result in this assay. Performed  At: [facility] [address] [place], [place] [id number] Rush Farmer MD OI:[id number]    [place]  Final    Comment: Performed at [facility], [place] [address] [place], [place] [address]  SARS Coronavirus 2     Status: None   Collection Time: 12/12/18  9:22 AM  Result Value Ref Range Status   SARS Coronavirus 2 NOT DETECTED NOT DETECTED Final    Comment: (NOTE) SARS-CoV-2 target nucleic acids are NOT DETECTED.  The SARS-CoV-2 RNA is generally detectable in upper and lower respiratory specimens during the acute phase of infection.  Negative  results do not preclude SARS-CoV-2 infection, do not rule out co-infections with other pathogens, and should not be used as the sole basis for treatment or other patient management decisions.  Negative results must be combined with clinical observations, patient history, and epidemiological information. The expected result is Not Detected. Fact Sheet for Patients: http://www.biofiredefense.com/wp-content/uploads/2020/03/BIOFIRE-COVID -19-patients.pdf Fact Sheet for Healthcare Providers: http://www.biofiredefense.com/wp-content/uploads/2020/03/BIOFIRE-COVID -19-hcp.pdf This test is not yet approved or cleared by the [place] and  has been authorized for detection and/or diagnosis of SARS-CoV-2 by FDA under  an Emergency Use Authorization (EUA).  This EUA will remain in effec t (meaning this test can be used) for the duration of  the COVID-19 declaration under Section 564(b)(1) of the Act, 21 U.S.C. section 360bbb-3(b)(1), unless the authorization is terminated or revoked sooner. Performed at [facility], [place] [address] [place], [place] [address]          Radiology Studies: Dg Chest Portable 1 View  Result Date: 12/12/2018 CLINICAL DATA:  Shortness of breath. EXAM: PORTABLE CHEST 1 VIEW COMPARISON:  11/24/2018. FINDINGS: Central line noted with tip over the cavoatrial junction. Stable cardiomegaly. Progressive bilateral pulmonary infiltrates most consistent bilateral pulmonary edema noted. Bilateral pneumonia cannot be excluded. Persistent right pleural effusion. New left pleural effusion. These findings are most consistent with CHF. No pneumothorax. IMPRESSION: 1 central line noted with tip over cavoatrial junction. 2. Cardiomegaly with bilateral pulmonary infiltrates most consistent with bilateral pulmonary edema. Bilateral pneumonia cannot be excluded. Persistent right pleural effusion. New left pleural effusion. These findings together most consistent with CHF. Electronically Signed   By: Marcello Moores  Register   On: 12/12/2018 07:00        Scheduled Meds: . aspirin EC  81 mg Oral Daily  . atorvastatin  40 mg Oral q1800  . heparin  5,000 Units Subcutaneous Q8H  . sodium chloride flush  3 mL Intravenous Q12H  . ticagrelor  90 mg Oral BID   Continuous Infusions: . sodium chloride    . DOBUTamine 2.5 mcg/kg/min (12/12/18 1519)  . furosemide 120 mg (12/13/18 0910)     LOS: 1 day     Vernell Leep, MD, FACP, [facility]. [facility]  To contact the attending provider between 7A-7P or the covering provider during after hours 7P-7A, please log into the web site [email or web address] and access using universal [place] password for that web site. If you do not have the password,  please call the hospital operator.  12/13/2018, 10:26 AM

## 2018-12-13 NOTE — Progress Notes (Signed)
   12/13/18 1520  Clinical Encounter Type  Visited With Patient  Visit Type Initial  Referral From Palliative care team  Consult/Referral To Chaplain  Spiritual Encounters  Spiritual Needs Prayer  The chaplain responded to PMT consult for Pt. spiritual care.  The chaplain read the Pt.chart before introducing herself to the Pt.  The chaplain listened to stories surrounded by the Pt. laughter. The stories were about the Pt. family experiences and her life choices that add to the Pt. unique, joyful personality. The Pt. accepted the chaplain's invitation for prayer and a  F/U spiritual care visit.

## 2018-12-14 DIAGNOSIS — Z7189 Other specified counseling: Secondary | ICD-10-CM

## 2018-12-14 DIAGNOSIS — Z515 Encounter for palliative care: Secondary | ICD-10-CM

## 2018-12-14 LAB — BASIC METABOLIC PANEL
Anion gap: 14 (ref 5–15)
BUN: 47 mg/dL — ABNORMAL HIGH (ref 8–23)
CO2: 25 mmol/L (ref 22–32)
Calcium: 9.4 mg/dL (ref 8.9–10.3)
Chloride: 97 mmol/L — ABNORMAL LOW (ref 98–111)
Creatinine, Ser: 3.72 mg/dL — ABNORMAL HIGH (ref 0.44–1.00)
GFR calc Af Amer: 14 mL/min — ABNORMAL LOW (ref >60)
GFR calc non Af Amer: 12 mL/min — ABNORMAL LOW (ref >60)
Glucose, Bld: 202 mg/dL — ABNORMAL HIGH (ref 70–99)
Potassium: 3 mmol/L — ABNORMAL LOW (ref 3.5–5.1)
Sodium: 136 mmol/L (ref 135–145)

## 2018-12-14 LAB — COOXEMETRY PANEL
Carboxyhemoglobin: 1.8 % — ABNORMAL HIGH (ref 0.5–1.5)
Carboxyhemoglobin: 1.4 % (ref 0.5–1.5)
Methemoglobin: 0.8 % (ref 0.0–1.5)
Methemoglobin: 0.8 % (ref 0.0–1.5)
O2 Saturation: 77.9 %
O2 Saturation: 72.6 %
Total hemoglobin: 9.6 g/dL — ABNORMAL LOW (ref 12.0–16.0)
Total hemoglobin: 9.4 g/dL — ABNORMAL LOW (ref 12.0–16.0)

## 2018-12-14 LAB — MAGNESIUM: Magnesium: 1.9 mg/dL (ref 1.7–2.4)

## 2018-12-14 LAB — GLUCOSE, CAPILLARY
Glucose-Capillary: 176 mg/dL — ABNORMAL HIGH (ref 70–99)
Glucose-Capillary: 135 mg/dL — ABNORMAL HIGH (ref 70–99)
Glucose-Capillary: 166 mg/dL — ABNORMAL HIGH (ref 70–99)
Glucose-Capillary: 125 mg/dL — ABNORMAL HIGH (ref 70–99)

## 2018-12-14 MED ORDER — POTASSIUM CHLORIDE CRYS ER 20 MEQ PO TBCR
40.0000 meq | EXTENDED_RELEASE_TABLET | Freq: Once | ORAL | Status: AC
  Administered 2018-12-14: 40 meq via ORAL
  Filled 2018-12-14: qty 2

## 2018-12-14 MED ORDER — FUROSEMIDE 80 MG PO TABS
80.0000 mg | ORAL_TABLET | Freq: Every day | ORAL | Status: DC
  Administered 2018-12-15 – 2018-12-18 (×4): 80 mg via ORAL
  Filled 2018-12-14 (×4): qty 1

## 2018-12-14 NOTE — Progress Notes (Addendum)
PROGRESS NOTE   [name]  IRW:[id number]    DOB: [birth year]    DOA: 12/12/2018  PCP: [name], MD   I have briefly reviewed patients previous medical records in [facility].  Brief Narrative:  68 year old female, lives with her wife, mobile with the help of a cane or walker, PMH of DM 2, HTN, COPD not on home oxygen, PEA arrest 08/07/2018 in Cath Lab, CAD s/p DES to mid LAD 7/61/9509, chronic systolic CHF on home dobutamine infusion, stage IV chronic kidney disease, multiple hospitalizations this year related to CHF and related complications, presented to ED on 12/12/2018 due to progressive dyspnea and cough.  Admitted for acute on chronic systolic CHF.  Advanced HF team consulted.  PMT consulted for goals of care and are supposed to have a family meeting in the hospital today.   Assessment & Plan:   Principal Problem:   Cardiorenal syndrome with renal failure Active Problems:   Diabetes mellitus (HCC)   CAD (coronary artery disease), native coronary artery   Acute on chronic systolic heart failure (HCC)   Acute renal failure with acute tubular necrosis superimposed on stage 4 chronic kidney disease ([place])   Hypoxia   Orthopnea   Acute on chronic systolic CHF/suspected [facility]  Cardiology consultation appreciated.  Indicate end-stage HF in context of advanced CKD.  TTE 11/25/2018: LVEF 25-30% with HK of mid LV, akinesis of distal 1/3 of LV  Clinical and radiological CHF flare.  PTA was on Lasix 40 mg on MWF  Started on high-dose IV Lasix 120 mg twice daily and metolazone added.  Recently discharged on metolazone 2.5 mcg/kg/min, continuing same  No beta-blockers due to low output heart failure.  No ACEI/ARB/Entresto/spironolactone or digoxin due to advanced CKD.  UO 950 mL yesterday and -1130 mL thus far.  Approximately 2 pound weight loss since admission.  As per cardiology follow-up yesterday, recommended hospice, ongoing diuresis for comfort.  Discussed with PMT  yesterday who are coordinating care with patient's wife and wife and patient's sister are planning to come in this morning for a family meeting with PMT.  Overall poor prognosis.  Symptomatically better.  Acute kidney injury on stage IV chronic kidney disease  Creatinine in early June ranging between 3.4-3.6  Presented with creatinine of 4.01, suspect cardiorenal worsening her CKD.  Continue diuresis as noted above.  Creatinine has improved (4.01 > 3.92 > 3.72)  Not a long-term HD candidate.  CAD s/p NSTEMI  LHC with DES to mid LAD 08/10/2018.  No ACS.  Continue aspirin, ticagrelor and atorvastatin.  Acute respiratory failure with hypoxia  Due to decompensated CHF.  Oxygen supplementation as needed and wean oxygen as tolerated for oxygen saturations >92%.  Currently saturating in the mid to high 90s on oxygen via nasal cannula at 2 L/min.  Type II DM with renal complications  Recent T2I 9.3.  Mildly uncontrolled and fluctuating.  SSI.  Adult failure to thrive  Due to severe and irreversible complex medical conditions as noted above.  Patient DNR this admission. PMT assisting with goals of care.  Anemia in CKD  Stable.  Hypokalemia  Replace and follow BMP.  Magnesium 1.9.   DVT prophylaxis: Subcutaneous heparin Code Status: DNR Family Communication: Discussed with spouse, updated care and answered questions. Disposition: To be determined pending further input from PMT and cardiology after family meeting.   Consultants:  Cardiology PMT  Procedures:  None.  Patient has right IJ CVL from prior to this admission.  Antimicrobials:  None.   Subjective: "My breathing is fine".  Denies dyspnea at rest or with exertion i.e. getting to the bedside commode or to the sink.  No chest pain.  Denies any other complaints.  ROS: As above, otherwise negative.  Objective:  Vitals:   12/14/18 0050 12/14/18 0116 12/14/18 0442 12/14/18 0734  BP: 106/68  106/76  95/64  Pulse: (!) 102  (!) 101 90  Resp: 20  20 18   Temp: 99.2 F (37.3 C)  (!) 97.5 F (36.4 C) 98.5 F (36.9 C)  TempSrc: Oral  Oral Axillary  SpO2: 95%  94% 98%  Weight:  48.9 kg    Height:        Examination:  General exam: Pleasant elderly female, small built and frail, chronically ill looking, lying comfortably propped up in bed without distress.  Overall looks much better than she did yesterday but currently she is resting and not exerting herself. Respiratory system: Clear to auscultation.  No increased work of breathing. Cardiovascular system: S1 & S2 heard, RRR.  JVD +.  S3 gallop + seems to be less prominent today.  No pedal edema or murmurs appreciated. Gastrointestinal system: Abdomen is nondistended, soft and nontender. No organomegaly or masses felt. Normal bowel sounds heard.  Stable Central nervous system: Alert and oriented. No focal neurological deficits. Extremities: Symmetric 5 x 5 power. Skin: No rashes, lesions or ulcers Psychiatry: Judgement and insight appear normal. Mood & affect appropriate.     Data Reviewed: I have personally reviewed following labs and imaging studies  CBC: Recent Labs  Lab 12/12/18 0639 12/13/18 0648  WBC 7.1 7.2  NEUTROABS  --  6.1  HGB 10.3* 9.7*  HCT 34.5* 31.4*  MCV 90.6 87.5  PLT 472* 485*   Basic Metabolic Panel: Recent Labs  Lab 12/12/18 0639 12/13/18 0648 12/14/18 0307 12/14/18 0709  NA 137 138 136  --   K 4.3 4.0 3.0*  --   CL 102 101 97*  --   CO2 19* 18* 25  --   GLUCOSE 226* 253* 202*  --   BUN 47* 47* 47*  --   CREATININE 4.01* 3.92* 3.72*  --   CALCIUM 9.6 9.8 9.4  --   MG  --   --   --  1.9   Cardiac Enzymes: Recent Labs  Lab 12/12/18 0639  TROPONINI 0.04*   HbA1C: No results for input(s): HGBA1C in the last 72 hours. CBG: Recent Labs  Lab 12/13/18 2059 12/14/18 0615  GLUCAP 214* 176*    Recent Results (from the past 240 hour(s))  Novel Coronavirus,NAA,(SEND-OUT TO REF LAB - TAT  24-48 hrs); Hosp Order     Status: None   Collection Time: 12/12/18  6:18 AM   Specimen: Nasopharyngeal Swab; Respiratory  Result Value Ref Range Status   SARS-CoV-2, NAA NOT DETECTED NOT DETECTED Final    Comment: (NOTE) This test was developed and its performance characteristics determined by [employer]. This test has not been FDA cleared or approved. This test has been authorized by FDA under an Emergency Use Authorization (EUA). This test is only authorized for the duration of time the declaration that circumstances exist justifying the authorization of the emergency use of in vitro diagnostic tests for detection of SARS-CoV-2 virus and/or diagnosis of COVID-19 infection under section 564(b)(1) of the Act, 21 U.S.C. 462VOJ-5(K)(0), unless the authorization is terminated or revoked sooner. When diagnostic testing is negative, the possibility of a false negative result should be considered in the  context of a patient's recent exposures and the presence of clinical signs and symptoms consistent with COVID-19. An individual without symptoms of COVID-19 and who is not shedding SARS-CoV-2 virus would expect to have a negative (not detected) result in this assay. Performed  At: [facility] [address] [place], [place] [id number] Rush Farmer MD DG:[id number]    [place]  Final    Comment: Performed at [facility], [place] [address] [place], [place] [address]  SARS Coronavirus 2     Status: None   Collection Time: 12/12/18  9:22 AM  Result Value Ref Range Status   SARS Coronavirus 2 NOT DETECTED NOT DETECTED Final    Comment: (NOTE) SARS-CoV-2 target nucleic acids are NOT DETECTED. The SARS-CoV-2 RNA is generally detectable in upper and lower respiratory specimens during the acute phase of infection.  Negative  results do not preclude SARS-CoV-2 infection, do not rule out co-infections with other pathogens, and should not be used  as the sole basis for treatment or other patient management decisions.  Negative results must be combined with clinical observations, patient history, and epidemiological information. The expected result is Not Detected. Fact Sheet for Patients: http://www.biofiredefense.com/wp-content/uploads/2020/03/BIOFIRE-COVID -19-patients.pdf Fact Sheet for Healthcare Providers: http://www.biofiredefense.com/wp-content/uploads/2020/03/BIOFIRE-COVID -19-hcp.pdf This test is not yet approved or cleared by the [place] and  has been authorized for detection and/or diagnosis of SARS-CoV-2 by FDA under an Emergency Use Authorization (EUA).  This EUA will remain in effec t (meaning this test can be used) for the duration of  the COVID-19 declaration under Section 564(b)(1) of the Act, 21 U.S.C. section 360bbb-3(b)(1), unless the authorization is terminated or revoked sooner. Performed at [facility], [place] [address] [place], [place] [address]          Radiology Studies: No results found.      Scheduled Meds: . aspirin EC  81 mg Oral Daily  . atorvastatin  40 mg Oral q1800  . feeding supplement (NEPRO CARB STEADY)  237 mL Oral BID BM  . heparin  5,000 Units Subcutaneous Q8H  . insulin aspart  0-9 Units Subcutaneous TID WC  . magnesium oxide  400 mg Oral Daily  . pantoprazole  40 mg Oral Daily  . sodium chloride flush  3 mL Intravenous Q12H  . ticagrelor  90 mg Oral BID   Continuous Infusions: . sodium chloride    . DOBUTamine 2.5 mcg/kg/min (12/13/18 1700)  . furosemide 120 mg (12/14/18 0825)     LOS: 2 days     Vernell Leep, MD, FACP, [facility]. [facility]  To contact the attending provider between 7A-7P or the covering provider during after hours 7P-7A, please log into the web site [email or web address] and access using universal [place] password for that web site. If you do not have the password, please call the hospital operator.  12/14/2018, 10:05 AM

## 2018-12-14 NOTE — Progress Notes (Signed)
Physical Therapy Treatment Patient Details Name: [name] MRN: [id number] DOB: [birth year] Today's Date: 12/14/2018    History of Present Illness 68 y.o. female  with past medical history of CAD, CHF (EF 20-25%), COPD, PEA arrest, CKD stg V, who was admitted on 12/12/2018 with increase SOB.  She has had multiple admissions recently for heart failure and was discharged on 12/05/2018 on dobutamine and IV lasix in the home.  Work up includes bilateral pulmonary edema on CXR,    PT Comments    Pt is up to walk and then did there ex to stretch LE's and work on safety with gait.  Her plan is to get hospice care per her report, which will be conducted possibly from home.  Pt is quite motivated to work and recommend she try to increase safe mobility with therapy before discontinuing to hospice care given her current tolerance for activity.  Follow acutely for gait and transfers, instruct in HEP as pt is nearing dc.   Follow Up Recommendations  Home health PT     Equipment Recommendations  None recommended by PT    Recommendations for Other Services       Precautions / Restrictions Precautions Precautions: Fall Restrictions Weight Bearing Restrictions: No    Mobility  Bed Mobility Overal bed mobility: Modified Independent                Transfers Overall transfer level: Modified independent Equipment used: Rolling walker (2 wheeled) Transfers: Sit to/from Stand Sit to Stand: Min guard            Ambulation/Gait Ambulation/Gait assistance: Min guard Gait Distance (Feet): 130 Feet Assistive device: Rolling walker (2 wheeled) Gait Pattern/deviations: Step-through pattern;Decreased stride length;Wide base of support Gait velocity: decreased  Gait velocity interpretation: <1.31 ft/sec, indicative of household ambulator General Gait Details: PT used O2 for tx   Stairs             Wheelchair Mobility    Modified Rankin (Stroke Patients Only)       Balance  Overall balance assessment: Needs assistance Sitting-balance support: Feet supported Sitting balance-Leahy Scale: Good     Standing balance support: Bilateral upper extremity supported;During functional activity Standing balance-Leahy Scale: Fair                              Cognition Arousal/Alertness: Awake/alert Behavior During Therapy: WFL for tasks assessed/performed Overall Cognitive Status: Within Functional Limits for tasks assessed                                        Exercises General Exercises - Lower Extremity Ankle Circles/Pumps: AROM;Both;5 reps Quad Sets: AROM;Both;10 reps Gluteal Sets: AROM;Both;10 reps Heel Slides: AROM;Both;10 reps Hip ABduction/ADduction: AROM;Both;10 reps Straight Leg Raises: AROM;Both;10 reps    General Comments General comments (skin integrity, edema, etc.): pt is getting up to walk with PT and noted O2 sats pre and post were stable, O2 carried along      Pertinent Vitals/Pain Pain Assessment: No/denies pain    Home Living                      Prior Function            PT Goals (current goals can now be found in the care plan section) Progress towards PT goals: Progressing toward goals  Frequency    Min 3X/week      PT Plan Current plan remains appropriate    Co-evaluation              AM-PAC PT "6 Clicks" Mobility   Outcome Measure  Help needed turning from your back to your side while in a flat bed without using bedrails?: None Help needed moving from lying on your back to sitting on the side of a flat bed without using bedrails?: None Help needed moving to and from a bed to a chair (including a wheelchair)?: A Little Help needed standing up from a chair using your arms (e.g., wheelchair or bedside chair)?: A Little Help needed to walk in hospital room?: A Little Help needed climbing 3-5 steps with a railing? : A Little 6 Click Score: 20    End of Session Equipment  Utilized During Treatment: Oxygen;Gait belt Activity Tolerance: Patient tolerated treatment well Patient left: in bed;with call bell/phone within reach Nurse Communication: Mobility status PT Visit Diagnosis: Unsteadiness on feet (R26.81);Other abnormalities of gait and mobility (R26.89);Muscle weakness (generalized) (M62.81)     Time: 6503-5465 PT Time Calculation (min) (ACUTE ONLY): 31 min  Charges:  $Gait Training: 8-22 mins $Therapeutic Exercise: 8-22 mins                    Ramond Dial 12/14/2018, 5:27 PM  Mee Hives, PT MS Acute Rehab Dept. Number: [employer]

## 2018-12-14 NOTE — Progress Notes (Signed)
Advanced Heart Failure Rounding Note   Subjective:    Remains on dobutamine 2.5. Remains weak. Denies SOB. On dobutamine 2.5 and IV lasix 120 bid  After Palliative care meeting have decided on residential Hospice   Objective:   Weight Range:  Vital Signs:   Temp:  [97.5 F (36.4 C)-99.2 F (37.3 C)] 98.5 F (36.9 C) (06/18 0734) Pulse Rate:  [90-106] 90 (06/18 0734) Resp:  [17-20] 18 (06/18 0734) BP: (95-106)/(64-76) 95/64 (06/18 0734) SpO2:  [77 %-100 %] 98 % (06/18 0734) Weight:  [48.9 kg] 48.9 kg (06/18 0116) Last BM Date: 12/13/18  Weight change: Filed Weights   12/12/18 1226 12/13/18 0105 12/14/18 0116  Weight: 47.1 kg 49.6 kg 48.9 kg    Intake/Output:   Intake/Output Summary (Last 24 hours) at 12/14/2018 1613 Last data filed at 12/14/2018 0449 Gross per 24 hour  Intake 278.44 ml  Output 950 ml  Net -671.56 ml     Physical Exam: General:  Weak appearing. Sitting up in bed No resp difficulty HEENT: normal Neck: supple. no JVP 7. Carotids 2+ bilat; no bruits. No lymphadenopathy or thryomegaly appreciated. Cor: PMI nondisplaced. Regular rate & rhythm. + s3. Lungs: clear Abdomen: soft, nontender, nondistended. No hepatosplenomegaly. No bruits or masses. Good bowel sounds. Extremities: no cyanosis, clubbing, rash, edema Neuro: alert & orientedx3, cranial nerves grossly intact. moves all 4 extremities w/o difficulty. Affect pleasant   Telemetry: Sinus tach 90-100 Personally reviewed   Labs: Basic Metabolic Panel: Recent Labs  Lab 12/12/18 0639 12/13/18 0648 12/14/18 0307 12/14/18 0709  NA 137 138 136  --   K 4.3 4.0 3.0*  --   CL 102 101 97*  --   CO2 19* 18* 25  --   GLUCOSE 226* 253* 202*  --   BUN 47* 47* 47*  --   CREATININE 4.01* 3.92* 3.72*  --   CALCIUM 9.6 9.8 9.4  --   MG  --   --   --  1.9    Liver Function Tests: No results for input(s): AST, ALT, ALKPHOS, BILITOT, PROT, ALBUMIN in the last 168 hours. No results for input(s):  LIPASE, AMYLASE in the last 168 hours. No results for input(s): AMMONIA in the last 168 hours.  CBC: Recent Labs  Lab 12/12/18 0639 12/13/18 0648  WBC 7.1 7.2  NEUTROABS  --  6.1  HGB 10.3* 9.7*  HCT 34.5* 31.4*  MCV 90.6 87.5  PLT 472* 458*    Cardiac Enzymes: Recent Labs  Lab 12/12/18 0639  TROPONINI 0.04*    BNP: BNP (last 3 results) Recent Labs    09/16/18 0342 11/24/18 1715 12/12/18 0639  BNP 1,254.7* 4,046.9* 1,631.0*    ProBNP (last 3 results) No results for input(s): PROBNP in the last 8760 hours.    Other results:  Imaging: No results found.   Medications:     Scheduled Medications: . aspirin EC  81 mg Oral Daily  . atorvastatin  40 mg Oral q1800  . feeding supplement (NEPRO CARB STEADY)  237 mL Oral BID BM  . [START ON 12/15/2018] furosemide  80 mg Oral Daily  . heparin  5,000 Units Subcutaneous Q8H  . insulin aspart  0-9 Units Subcutaneous TID WC  . magnesium oxide  400 mg Oral Daily  . pantoprazole  40 mg Oral Daily  . sodium chloride flush  3 mL Intravenous Q12H  . ticagrelor  90 mg Oral BID    Infusions: . sodium chloride    .  furosemide 120 mg (12/14/18 0825)    PRN Medications: sodium chloride, acetaminophen, alum & mag hydroxide-simeth, HYDROmorphone (DILAUDID) injection, ondansetron (ZOFRAN) IV, sodium chloride flush, sodium chloride flush, white petrolatum   Assessment/Plan:   1. A/C Systolic HF -> cardiogenic shock , suspect ICM/NICM. ECHO 10/2018 Ef 25-30% .  - On dobutamine 2.5 mcg at home.  - She has end-stage HF and advanced CKD now with worsening HF symptoms and volume overload despite dobutamine support. Co-ox very low yesterday but back up today. Long discussion with her about options. She and her family have met with Palliative Care team (thank you) and have decided on [facility] - Will stop dobutamine - Continue IV lasix today. Switch to 80 po daily tomorrow.  2. AKI on CKD Stage IV.  - Creatinine she was  discharged on 6/9 was 3.5 but creatinine 4.0 on admit. Now 3.7 - Cardiorenal  3. CAD  S/P NSTEMI  -08/10/18 LHC with DES 80% mLAD -Continue ticagrelor + asa 81 mg daily + atorvastatin 40 mg daily.   4.DNR -> Hospice - Palliative Care consulted. See discussion above   Length of Stay: 2   Glori Bickers MD 12/14/2018, 4:13 PM  Advanced Heart Failure Team Pager [phone] (M-F; [place])  Please contact [facility] for night-coverage after hours (4p -7a ) and weekends on [email or web address]

## 2018-12-14 NOTE — Plan of Care (Signed)
  Problem: Education: Goal: Ability to demonstrate management of disease process will improve Outcome: Progressing Goal: Ability to verbalize understanding of medication therapies will improve Outcome: Progressing Goal: Individualized Educational Video(s) Outcome: Progressing   Problem: Activity: Goal: Capacity to carry out activities will improve Outcome: Progressing   Problem: Cardiac: Goal: Ability to achieve and maintain adequate cardiopulmonary perfusion will improve Outcome: Progressing   Problem: Health Behavior/Discharge Planning: Goal: Ability to manage health-related needs will improve Outcome: Progressing   Problem: Clinical Measurements: Goal: Will remain free from infection Outcome: Progressing Goal: Diagnostic test results will improve Outcome: Progressing   Problem: Nutrition: Goal: Adequate nutrition will be maintained Outcome: Progressing   Problem: Coping: Goal: Level of anxiety will decrease Outcome: Progressing   Problem: Elimination: Goal: Will not experience complications related to bowel motility Outcome: Progressing Goal: Will not experience complications related to urinary retention Outcome: Progressing   Problem: Pain Managment: Goal: General experience of comfort will improve Outcome: Progressing   Problem: Safety: Goal: Ability to remain free from injury will improve Outcome: Progressing   Problem: Skin Integrity: Goal: Risk for impaired skin integrity will decrease Outcome: Progressing

## 2018-12-15 DIAGNOSIS — Z515 Encounter for palliative care: Secondary | ICD-10-CM

## 2018-12-15 DIAGNOSIS — R0602 Shortness of breath: Secondary | ICD-10-CM

## 2018-12-15 LAB — GLUCOSE, CAPILLARY
Glucose-Capillary: 138 mg/dL — ABNORMAL HIGH (ref 70–99)
Glucose-Capillary: 232 mg/dL — ABNORMAL HIGH (ref 70–99)
Glucose-Capillary: 152 mg/dL — ABNORMAL HIGH (ref 70–99)
Glucose-Capillary: 212 mg/dL — ABNORMAL HIGH (ref 70–99)

## 2018-12-15 LAB — BASIC METABOLIC PANEL
Anion gap: 16 — ABNORMAL HIGH (ref 5–15)
BUN: 50 mg/dL — ABNORMAL HIGH (ref 8–23)
CO2: 23 mmol/L (ref 22–32)
Calcium: 9.3 mg/dL (ref 8.9–10.3)
Chloride: 98 mmol/L (ref 98–111)
Creatinine, Ser: 4.18 mg/dL — ABNORMAL HIGH (ref 0.44–1.00)
GFR calc Af Amer: 12 mL/min — ABNORMAL LOW (ref >60)
GFR calc non Af Amer: 10 mL/min — ABNORMAL LOW (ref >60)
Glucose, Bld: 125 mg/dL — ABNORMAL HIGH (ref 70–99)
Potassium: 3.2 mmol/L — ABNORMAL LOW (ref 3.5–5.1)
Sodium: 137 mmol/L (ref 135–145)

## 2018-12-15 MED ORDER — POTASSIUM CHLORIDE CRYS ER 20 MEQ PO TBCR
40.0000 meq | EXTENDED_RELEASE_TABLET | Freq: Once | ORAL | Status: AC
  Administered 2018-12-15: 40 meq via ORAL
  Filled 2018-12-15: qty 2

## 2018-12-15 NOTE — Progress Notes (Signed)
PROGRESS NOTE   [name]  WUJ:[id number]    DOB: Oct 18, 1950    DOA: 12/12/2018  PCP: [name], MD   I have briefly reviewed patients [facility].  Brief Narrative:  68 year old female, lives with her wife, mobile with the help of a cane or walker, PMH of DM 2, HTN, COPD not on home oxygen, PEA arrest 08/07/2018 in Cath Lab, CAD s/p DES to mid LAD 9/56/2130, chronic systolic CHF on home dobutamine infusion, stage IV chronic kidney disease, multiple hospitalizations this year related to CHF and related complications, presented to ED on 12/12/2018 due to progressive dyspnea and cough.  Admitted for acute on chronic systolic CHF.  Advanced HF team consulted.  PMT consulted for goals, patient opting for hospice and eventually to either DC to residential hospice versus home with palliative care (if IV dobutamine continued) versus hospice without IV medications.  Assessment & Plan:   Principal Problem:   Cardiorenal syndrome with renal failure Active Problems:   Diabetes mellitus (HCC)   CAD (coronary artery disease), native coronary artery   Acute on chronic systolic heart failure (HCC)   Acute renal failure with acute tubular necrosis superimposed on stage 4 chronic kidney disease ([place])   Hypoxia   Orthopnea   Encounter for hospice care discussion   Acute on chronic systolic CHF/suspected ICM  Cardiology follow-up appreciated.  Indicate end-stage HF in context of advanced CKD.  TTE 11/25/2018: LVEF 25-30% with HK of mid LV, akinesis of distal 1/3 of LV  Presented with clinical and radiological CHF flare.  PTA was on Lasix 40 mg on MWF  Started on high-dose IV Lasix 120 mg twice daily and metolazone added.  Recently discharged on dobutamine 2.5 mcg/kg/min, which was continued in the hospital  No beta-blockers due to low output heart failure.  No ACEI/ARB/Entresto/spironolactone or digoxin due to advanced CKD.  Despite aggressive diuresis, not  much negative balance or weight loss.  Clinically has improved but remains at extremely high risk for recurrent decline.  After PMT input, IV dobutamine was discontinued and IV Lasix was switched to Lasix 80 mg orally daily.  Acute kidney injury on stage IV chronic kidney disease  Creatinine in early June ranging between 3.4-3.6  Presented with creatinine of 4.01, suspect cardiorenal worsening her CKD.  Continue diuresis as noted above.  Creatinine had improved (4.01 > 3.92 > 3.72) but back up to 4.18 today.  Not a long-term HD candidate.  CAD s/p NSTEMI  LHC with DES to mid LAD 08/10/2018.  No ACS.  Continue aspirin, ticagrelor and atorvastatin.  Acute respiratory failure with hypoxia  Due to decompensated CHF.  Oxygen supplementation as needed and wean oxygen as tolerated for oxygen saturations >92%.  Currently saturating in the mid to high 90s on oxygen via nasal cannula at 2 L/min.  Wean oxygen as tolerated for saturations >92% and may continue oxygen for comfort.  Type II DM with renal complications  Recent Q6V 9.3.  Mildly uncontrolled and fluctuating.  SSI.  Adult failure to thrive  Due to severe and irreversible complex medical conditions as noted above.  Patient DNR this admission.    PMT met with patient and extended family on 6/18 for goals of care which are as above.  Anemia in CKD  Stable.  Hypokalemia  Replace and follow BMP.  Magnesium 1.9.   DVT prophylaxis: Subcutaneous heparin Code Status: DNR Family Communication: Discussed with spouse on 6/18, updated care and answered questions. Disposition: As  noted above i.e. residential hospice versus home with either palliative care or home hospice when okay with cardiology and PMT coordinating.   Consultants:  Cardiology PMT  Procedures:  None.  Patient has right IJ CVL from prior to this admission.  Antimicrobials:  None.   Subjective: Denies complaints.  No dyspnea or chest pain.   As per RN, no acute issues noted.  ROS: As above, otherwise negative.  Objective:  Vitals:   12/14/18 1941 12/14/18 2319 12/15/18 0052 12/15/18 0309  BP: (!) 83/62 102/80  90/71  Pulse: 94 96  87  Resp: 18 18    Temp: 97.9 F (36.6 C) 97.7 F (36.5 C)  98.5 F (36.9 C)  TempSrc: Oral Oral  Oral  SpO2: 100% 100%  99%  Weight:   49.4 kg   Height:        Examination:  General exam: Pleasant elderly female, small built and frail, chronically ill looking, lying comfortably propped up in bed. Respiratory system: Clear to auscultation.  No increased work of breathing.  Stable without change. Cardiovascular system: S1 & S2 heard, RRR.  No JVD.  No gallops appreciated today.  No pedal edema or murmurs appreciated.  Telemetry personally reviewed: Sinus rhythm. Gastrointestinal system: Abdomen is nondistended, soft and nontender. No organomegaly or masses felt. Normal bowel sounds heard.  Stable Central nervous system: Alert and oriented. No focal neurological deficits. Extremities: Symmetric 5 x 5 power. Skin: No rashes, lesions or ulcers Psychiatry: Judgement and insight appear normal. Mood & affect appropriate.     Data Reviewed: I have personally reviewed following labs and imaging studies  CBC: Recent Labs  Lab 12/12/18 0639 12/13/18 0648  WBC 7.1 7.2  NEUTROABS  --  6.1  HGB 10.3* 9.7*  HCT 34.5* 31.4*  MCV 90.6 87.5  PLT 472* 354*   Basic Metabolic Panel: Recent Labs  Lab 12/12/18 0639 12/13/18 0648 12/14/18 0307 12/14/18 0709 12/15/18 0345  NA 137 138 136  --  137  K 4.3 4.0 3.0*  --  3.2*  CL 102 101 97*  --  98  CO2 19* 18* 25  --  23  GLUCOSE 226* 253* 202*  --  125*  BUN 47* 47* 47*  --  50*  CREATININE 4.01* 3.92* 3.72*  --  4.18*  CALCIUM 9.6 9.8 9.4  --  9.3  MG  --   --   --  1.9  --    Cardiac Enzymes: Recent Labs  Lab 12/12/18 0639  TROPONINI 0.04*   HbA1C: No results for input(s): HGBA1C in the last 72 hours. CBG: Recent Labs  Lab  12/14/18 0615 12/14/18 1118 12/14/18 1638 12/14/18 2114 12/15/18 0537  GLUCAP 176* 135* 166* 125* 138*    Recent Results (from the past 240 hour(s))  Novel Coronavirus,NAA,(SEND-OUT TO REF LAB - TAT 24-48 hrs); Hosp Order     Status: None   Collection Time: 12/12/18  6:18 AM   Specimen: Nasopharyngeal Swab; Respiratory  Result Value Ref Range Status   SARS-CoV-2, NAA NOT DETECTED NOT DETECTED Final    Comment: (NOTE) This test was developed and its performance characteristics determined by [employer]. This test has not been FDA cleared or approved. This test has been authorized by FDA under an Emergency Use Authorization (EUA). This test is only authorized for the duration of time the declaration that circumstances exist justifying the authorization of the emergency use of in vitro diagnostic tests for detection of SARS-CoV-2 virus and/or diagnosis of COVID-19  infection under section 564(b)(1) of the Act, 21 U.S.C. 931PET-6(K)(4), unless the authorization is terminated or revoked sooner. When diagnostic testing is negative, the possibility of a false negative result should be considered in the context of a patient's recent exposures and the presence of clinical signs and symptoms consistent with COVID-19. An individual without symptoms of COVID-19 and who is not shedding SARS-CoV-2 virus would expect to have a negative (not detected) result in this assay. Performed  At: [facility] [address] [place], [place] [id number] Rush Farmer MD JD:[id number]    [place]  Final    Comment: Performed at [facility], [place] [address], [place], [place] [address]  SARS Coronavirus 2     Status: None   Collection Time: 12/12/18  9:22 AM  Result Value Ref Range Status   SARS Coronavirus 2 NOT DETECTED NOT DETECTED Final    Comment: (NOTE) SARS-CoV-2 target nucleic acids are NOT DETECTED. The SARS-CoV-2 RNA is generally detectable in  upper and lower respiratory specimens during the acute phase of infection.  Negative  results do not preclude SARS-CoV-2 infection, do not rule out co-infections with other pathogens, and should not be used as the sole basis for treatment or other patient management decisions.  Negative results must be combined with clinical observations, patient history, and epidemiological information. The expected result is Not Detected. Fact Sheet for Patients: http://www.biofiredefense.com/wp-content/uploads/2020/03/BIOFIRE-COVID -19-patients.pdf Fact Sheet for Healthcare Providers: http://www.biofiredefense.com/wp-content/uploads/2020/03/BIOFIRE-COVID -19-hcp.pdf This test is not yet approved or cleared by the [place] and  has been authorized for detection and/or diagnosis of SARS-CoV-2 by FDA under an Emergency Use Authorization (EUA).  This EUA will remain in effec t (meaning this test can be used) for the duration of  the COVID-19 declaration under Section 564(b)(1) of the Act, 21 U.S.C. section 360bbb-3(b)(1), unless the authorization is terminated or revoked sooner. Performed at [facility], [place] [address], [place], [place] [address]          Radiology Studies: No results found.      Scheduled Meds: . aspirin EC  81 mg Oral Daily  . atorvastatin  40 mg Oral q1800  . feeding supplement (NEPRO CARB STEADY)  237 mL Oral BID BM  . furosemide  80 mg Oral Daily  . heparin  5,000 Units Subcutaneous Q8H  . insulin aspart  0-9 Units Subcutaneous TID WC  . magnesium oxide  400 mg Oral Daily  . pantoprazole  40 mg Oral Daily  . sodium chloride flush  3 mL Intravenous Q12H  . ticagrelor  90 mg Oral BID   Continuous Infusions: . sodium chloride 10 mL/hr at 12/15/18 0600     LOS: 3 days     Vernell Leep, MD, [place], [facility]. [facility]  To contact the attending provider between 7A-7P or the covering provider during after hours 7P-7A, please log into the  web site [email or web address] and access using universal [place] password for that web site. If you do not have the password, please call the hospital operator.  12/15/2018, 10:39 AM

## 2018-12-15 NOTE — Progress Notes (Addendum)
Daily Progress Note   Patient Name: [name]       Date: 12/14/2018 DOB: [birth year]  Age: 68 y.o. MRN#: [id number] Attending Physician: Modena Jansky, MD Primary Care Physician: [name], MD Admit Date: 12/12/2018  Reason for Consultation/Follow-up: Establishing goals of care  Subjective: Met with patient, her wife [name], two SIL and 1 brother [name].  Discussed her heart and kidney disease as well as her life at home. Patient and family want "[name]" well cared for.  They do not want her to continue to develop symptoms and repeatedly return to the hospital.  Discussed the fact that if she improves there may be another opportunity to go home with IV medications - but she is likely to decline as she did this past week.    After discussion family and patient seemed to agree that if she is able to come home with IV medications and have good quality of life for weeks to months that may be an option. However if it appears that she will decline rapidly again they all prefer for her to go directly from the hospital to [facility].    Patient and family comfortable with waiting a couple of days for the patient to declare herself and then making a final decision.   Assessment: 68 yo female end stage renal and cardiac disease   Length of Stay: 3  Current Medications: Scheduled Meds:  . aspirin EC  81 mg Oral Daily  . atorvastatin  40 mg Oral q1800  . feeding supplement (NEPRO CARB STEADY)  237 mL Oral BID BM  . furosemide  80 mg Oral Daily  . heparin  5,000 Units Subcutaneous Q8H  . insulin aspart  0-9 Units Subcutaneous TID WC  . magnesium oxide  400 mg Oral Daily  . pantoprazole  40 mg Oral Daily  . potassium chloride  40 mEq Oral Once  . sodium chloride flush  3 mL  Intravenous Q12H  . ticagrelor  90 mg Oral BID    Continuous Infusions: . sodium chloride 10 mL/hr at 12/15/18 0600    PRN Meds: sodium chloride, acetaminophen, alum & mag hydroxide-simeth, HYDROmorphone (DILAUDID) injection, ondansetron (ZOFRAN) IV, sodium chloride flush, sodium chloride flush, white petrolatum  Physical Exam       Elderly thin female Alert, orientated, cooperative, NAD  Vital Signs: BP 90/71 (BP Location: Right Arm)   Pulse 87   Temp 98.5 F (36.9 C) (Oral)   Resp 18   Ht _0  (1.575 m)   Wt 49.4 kg   LMP  (Exact Date)   SpO2 99%   BMI 19.92 kg/m  SpO2: SpO2: 99 % O2 Device: O2 Device: Nasal Cannula O2 Flow Rate: O2 Flow Rate (L/min): 2 L/min  Intake/output summary:   Intake/Output Summary (Last 24 hours) at 12/15/2018 6962 Last data filed at 12/15/2018 0600 Gross per 24 hour  Intake 290.18 ml  Output 400 ml  Net -109.82 ml   LBM: Last BM Date: 12/13/18 Baseline Weight: Weight: 49.9 kg Most recent weight: Weight: 49.4 kg       Palliative Assessment/Data: 30%      Patient Active Problem List   Diagnosis Date Noted  . Encounter for hospice care discussion   . Hypoxia   . Orthopnea   . Cardiorenal syndrome with renal failure 12/12/2018  . Acute on chronic systolic heart failure ([place])   . Acute renal failure with acute tubular necrosis superimposed on stage 4 chronic kidney disease ([place])   . Palliative care encounter   . COPD (chronic obstructive pulmonary disease) ([place]) 11/25/2018  . Pulmonary HTN ([place]) 11/25/2018  . AKI (acute kidney injury) ([place]) 11/24/2018  . Chronic systolic CHF (congestive heart failure) ([place]) 11/24/2018  . Malnutrition of moderate degree 09/19/2018  . Acute on chronic systolic (congestive) heart failure ([place]) 09/16/2018  . Acute on chronic respiratory failure with hypoxia ([place]) 09/16/2018  . CKD (chronic kidney disease), stage III ([place]) 09/16/2018  . CAD (coronary artery disease), native coronary artery 09/16/2018   . NSTEMI (non-ST elevated myocardial infarction) ([place]) 08/06/2018  . Hypokalemia 08/06/2018  . Diabetes mellitus ([place]) 08/06/2018  . NSTEMI, initial episode of care [facility]) 08/06/2018    Palliative Care Plan    Recommendations/Plan:  Discussed with HF team.  Will give patient a couple of days to declare herself. Then decide Hospice House vs Home   Code Status:  DNR  Prognosis:   < 2 weeks if she shifts to comfort measures only   Discharge Planning:  To Be Determined  [place] (most likely) vs Home with either Palliative and IV medications or Home with Hospice.  Patient needs a day or two to declare herself.  Care plan was discussed with HF MD, family and patient.  Thank you for allowing the Palliative Medicine Team to assist in the care of this patient.  Total time spent:  60 min     Greater than 50%  of this time was spent counseling and coordinating care related to the above assessment and plan.  Florentina Jenny, PA-C Palliative Medicine  Please contact Palliative MedicineTeam phone at [phone] for questions and concerns between 7 am - 7 pm.   Please see AMION for individual provider pager numbers.

## 2018-12-15 NOTE — Progress Notes (Signed)
Manufacturing engineer [facility]) Hospice  Received referral from PMT for residential hospice at [facility].    Patient is eligible for residential hospice, however we do not have a bed to offer today.  Spoke with Colletta [place] (wife), confirmed interest, offered support.  She is aware that [facility] will f/u with both her and the [facility] team in the morning.  Thank you, Venia Carbon RN, BSN, [place] (in [place] under [place] [facility] [place]) [phone] (main #)

## 2018-12-15 NOTE — Plan of Care (Signed)
  Problem: Education: Goal: Ability to demonstrate management of disease process will improve Outcome: Progressing Goal: Ability to verbalize understanding of medication therapies will improve Outcome: Progressing Goal: Individualized Educational Video(s) Outcome: Progressing   Problem: Activity: Goal: Capacity to carry out activities will improve Outcome: Progressing   Problem: Cardiac: Goal: Ability to achieve and maintain adequate cardiopulmonary perfusion will improve Outcome: Progressing   Problem: Health Behavior/Discharge Planning: Goal: Ability to manage health-related needs will improve Outcome: Progressing   Problem: Clinical Measurements: Goal: Will remain free from infection Outcome: Progressing Goal: Diagnostic test results will improve Outcome: Progressing   Problem: Nutrition: Goal: Adequate nutrition will be maintained Outcome: Progressing   Problem: Coping: Goal: Level of anxiety will decrease Outcome: Progressing   Problem: Elimination: Goal: Will not experience complications related to bowel motility Outcome: Progressing Goal: Will not experience complications related to urinary retention Outcome: Progressing   Problem: Pain Managment: Goal: General experience of comfort will improve Outcome: Progressing   Problem: Safety: Goal: Ability to remain free from injury will improve Outcome: Progressing   Problem: Skin Integrity: Goal: Risk for impaired skin integrity will decrease Outcome: Progressing

## 2018-12-15 NOTE — Progress Notes (Signed)
Advanced Heart Failure Rounding Note   Subjective:    Off dobutamine. Feels good. Denies SOB. Creatinine up to 4.2  Pending d/c to [facility] place   Objective:   Weight Range:  Vital Signs:   Temp:  [97.7 F (36.5 C)-98.5 F (36.9 C)] 98.5 F (36.9 C) (06/19 0309) Pulse Rate:  [87-96] 87 (06/19 0309) Resp:  [18] 18 (06/18 2319) BP: (83-102)/(62-80) 90/71 (06/19 0309) SpO2:  [99 %-100 %] 99 % (06/19 0309) Weight:  [49.4 kg] 49.4 kg (06/19 0052) Last BM Date: 12/13/18  Weight change: Filed Weights   12/13/18 0105 12/14/18 0116 12/15/18 0052  Weight: 49.6 kg 48.9 kg 49.4 kg    Intake/Output:   Intake/Output Summary (Last 24 hours) at 12/15/2018 1524 Last data filed at 12/15/2018 0941 Gross per 24 hour  Intake 590.18 ml  Output 500 ml  Net 90.18 ml     Physical Exam: General:  Weak appearing. Sitting up in bed No resp difficulty HEENT: normal Neck: supple. no JVD. Carotids 2+ bilat; no bruits. No lymphadenopathy or thryomegaly appreciated. Cor: PMI nondisplaced. Regular rate & rhythm. +s3 Lungs: clear Abdomen: soft, nontender, nondistended. No hepatosplenomegaly. No bruits or masses. Good bowel sounds. Extremities: no cyanosis, clubbing, rash, edema Neuro: alert & orientedx3, cranial nerves grossly intact. moves all 4 extremities w/o difficulty. Affect pleasant    Telemetry: Sinus 80-90 Personally reviewed   Labs: Basic Metabolic Panel: Recent Labs  Lab 12/12/18 0639 12/13/18 0648 12/14/18 0307 12/14/18 0709 12/15/18 0345  NA 137 138 136  --  137  K 4.3 4.0 3.0*  --  3.2*  CL 102 101 97*  --  98  CO2 19* 18* 25  --  23  GLUCOSE 226* 253* 202*  --  125*  BUN 47* 47* 47*  --  50*  CREATININE 4.01* 3.92* 3.72*  --  4.18*  CALCIUM 9.6 9.8 9.4  --  9.3  MG  --   --   --  1.9  --     Liver Function Tests: No results for input(s): AST, ALT, ALKPHOS, BILITOT, PROT, ALBUMIN in the last 168 hours. No results for input(s): LIPASE, AMYLASE in the last  168 hours. No results for input(s): AMMONIA in the last 168 hours.  CBC: Recent Labs  Lab 12/12/18 0639 12/13/18 0648  WBC 7.1 7.2  NEUTROABS  --  6.1  HGB 10.3* 9.7*  HCT 34.5* 31.4*  MCV 90.6 87.5  PLT 472* 458*    Cardiac Enzymes: Recent Labs  Lab 12/12/18 0639  TROPONINI 0.04*    BNP: BNP (last 3 results) Recent Labs    09/16/18 0342 11/24/18 1715 12/12/18 0639  BNP 1,254.7* 4,046.9* 1,631.0*    ProBNP (last 3 results) No results for input(s): PROBNP in the last 8760 hours.    Other results:  Imaging: No results found.   Medications:     Scheduled Medications: . aspirin EC  81 mg Oral Daily  . atorvastatin  40 mg Oral q1800  . feeding supplement (NEPRO CARB STEADY)  237 mL Oral BID BM  . furosemide  80 mg Oral Daily  . heparin  5,000 Units Subcutaneous Q8H  . insulin aspart  0-9 Units Subcutaneous TID WC  . magnesium oxide  400 mg Oral Daily  . pantoprazole  40 mg Oral Daily  . sodium chloride flush  3 mL Intravenous Q12H  . ticagrelor  90 mg Oral BID    Infusions: . sodium chloride 10 mL/hr at 12/15/18 0600  PRN Medications: sodium chloride, acetaminophen, alum & mag hydroxide-simeth, HYDROmorphone (DILAUDID) injection, ondansetron (ZOFRAN) IV, sodium chloride flush, sodium chloride flush, white petrolatum   Assessment/Plan:   1. A/C Systolic HF -> cardiogenic shock , suspect ICM/NICM. ECHO 10/2018 Ef 25-30% .  - On dobutamine 2.5 mcg at home.  - She has end-stage HF and advanced CKD now with worsening HF symptoms and volume overload despite dobutamine support. Co-ox very low yesterday but back up today. Long discussion with her about options. She and her family have met with Palliative Care team (thank you) and have decided [facility] - Dobutamine stopped  - Pending d/c to [facility]. We discussed possible trajectories of steady worsening at [facility] versus possible temporary stabilization with possible transition to home  Hospice from [facility] lasix 80 po daily.   2. AKI on CKD Stage IV.  - Creatinine now 4.2  3. CAD  S/P NSTEMI  - 08/10/18 LHC with DES 80% mLAD - Continue ticagrelor + asa 81 mg daily - can stop statin   4.DNR -> Hospice - Palliative Care consulted. See discussion above   Length of Stay: 3   Glori Bickers MD 12/15/2018, 3:24 PM  Advanced Heart Failure Team Pager [phone] (M-F; [place])  Please contact [facility] for night-coverage after hours (4p -7a ) and weekends on [email or web address]

## 2018-12-15 NOTE — Care Management Important Message (Signed)
Important Message  Patient Details  Name: [name] MRN: [id number] Date of Birth: 04/25/1951   Medicare Important Message Given:  Yes     [place], [place] 12/15/2018, 12:00 PM

## 2018-12-15 NOTE — Consult Note (Signed)
   [facility] CM Inpatient Consult   12/15/2018  [name] Jan 28, 1951 [id number]   Chart reviewed for High Risk Scores  of 29% and for unplanned readmission within 7 days. Patient evaluated for [place] Management services.  Patient is not currently a beneficiary of the attributed [name] in the [employer].  Her Primary Care Provider is not a [facility] primary care provider, [name],  or is not [name] affiliated.   This patient is Not eligible for [facility] Care Management Services.   Chart review is that patient will be followed by Hospice for care.    Plan:  Sign off  Natividad Brood, RN BSN [facility] Liaison  [phone] business mobile phone Toll free office [phone]  Fax number: [phone] [email or web address]

## 2018-12-15 NOTE — Progress Notes (Signed)
  Palliative medicine progress note  Patient seen, chart reviewed.  Patient is alert and oriented x4.  She reports feeling better.  She has had a very fluctuating hospital course alternating looking like she is dying imminently to being more alert and talkative as she is today.  She seems to have a good grasp of the severity of her illness however, now with worsening heart failure, EF 25 to 30% and impending renal failure, creatinine 4.18.  She has been taken off dobutamine drip and is currently on Lasix 80 twice daily  Staffed with Dr. Jeffie Pollock with cardiology.  Unfortunately at this point there is not much else to offer patient.  She was recently admitted and discharged home on a Lasix drip and dobutamine gtt but was readmitted within 5 days with worsening heart failure and associated kidney failure  Patient's goals are no hemodialysis with focus on comfort and management of shortness of breath.  She is accepting that she is nearing end-of-life  Ms. Chalmers and I talked about options of home versus residential hospice.  She is inclined for residential hospice.    Patient Profile/HPI:  68 y.o.femalewith past medical history of CAD, CHF (EF 20-25%), COPD, PEA arrest, CKD stg V,who was admitted on 6/16/2020with increase SOB.She has had multiple admissions recently for heart failure and was discharged on 12/05/2018 on dobutamine and IV lasix in the home. Initial work up today reveals a BNP of 1631, bilateral pulmonary edema on CXR, and GFR of less than 12.  Despite aggressive measures including restarting dobutamine drip her creatinine has continued to worsen.  Palliative medicine team is been in contact with Mrs. Geffert as well as her family regarding residential hospice  Plan Family is hopeful for residential hospice approval.  Offered choice per Medicare guidelines: [facility] elects [facility].  I have also called hospice and palliative care of [facility] liaison directly with  clinical.  Patient would like to continue on Lasix orally as long as she can take pills  She has been symptomatic in terms of her heart failure with dyspnea.  Continue Dilaudid 0.25- 0.5 mg every 3 hours as needed for shortness of breath.  Monitor for need for scheduled dosing or gtt  Patient spouse, Rudene Anda at [phone] is in agreement with this plan.  She understands that residential hospice cannot perform IV fluids, IV antibiotics, dobutamine drips, milrinone drips.  Family inquired "what if she gets better again?" I  attempted to prepare family that if patient were to stabilize, hospice and palliative care would help them make another plan, likely to return home with hospice support.  Family understands that it is unlikely that she will rebound from this.  She has had a very rapid decline, downward trajectory with her heart failure beginning with her heart attack 4 months ago   Disposition Residential hospice.  Family elect[facility]   Prognosis Less than 2 weeks in the setting of now cardiorenal syndrome, systolic heart failure with EF of 25 to 30%, chronic kidney disease stage V, creatinine 4.18.  Patient is not a candidate for hemodialysis  Thank you, Romona Curls, NP Total time: 35 minutes Greater than 50% of time was spent in counseling and coordination of care

## 2018-12-16 LAB — BASIC METABOLIC PANEL
Anion gap: 16 — ABNORMAL HIGH (ref 5–15)
BUN: 60 mg/dL — ABNORMAL HIGH (ref 8–23)
CO2: 20 mmol/L — ABNORMAL LOW (ref 22–32)
Calcium: 9.3 mg/dL (ref 8.9–10.3)
Chloride: 98 mmol/L (ref 98–111)
Creatinine, Ser: 4.82 mg/dL — ABNORMAL HIGH (ref 0.44–1.00)
GFR calc Af Amer: 10 mL/min — ABNORMAL LOW (ref >60)
GFR calc non Af Amer: 9 mL/min — ABNORMAL LOW (ref >60)
Glucose, Bld: 202 mg/dL — ABNORMAL HIGH (ref 70–99)
Potassium: 4.5 mmol/L (ref 3.5–5.1)
Sodium: 134 mmol/L — ABNORMAL LOW (ref 135–145)

## 2018-12-16 LAB — GLUCOSE, CAPILLARY
Glucose-Capillary: 149 mg/dL — ABNORMAL HIGH (ref 70–99)
Glucose-Capillary: 178 mg/dL — ABNORMAL HIGH (ref 70–99)
Glucose-Capillary: 176 mg/dL — ABNORMAL HIGH (ref 70–99)
Glucose-Capillary: 185 mg/dL — ABNORMAL HIGH (ref 70–99)

## 2018-12-16 MED ORDER — FUROSEMIDE 10 MG/ML IJ SOLN
120.0000 mg | Freq: Once | INTRAVENOUS | Status: AC
  Administered 2018-12-16: 120 mg via INTRAVENOUS
  Filled 2018-12-16: qty 10

## 2018-12-16 NOTE — Progress Notes (Signed)
PROGRESS NOTE   [name]  NLZ:[id number]    DOB: [birth year]    DOA: 12/12/2018  PCP: [name], MD   I have briefly reviewed patients previous medical records in [facility].  Brief Narrative:  68 year old female, lives with her wife, mobile with the help of a cane or walker, PMH of DM 2, HTN, COPD not on home oxygen, PEA arrest 08/07/2018 in Cath Lab, CAD s/p DES to mid LAD 02/28/4096, chronic systolic CHF on home dobutamine infusion, stage IV chronic kidney disease, multiple hospitalizations this year related to CHF and related complications, presented to ED on 12/12/2018 due to progressive dyspnea and cough.  Admitted for acute on chronic systolic CHF.  Advanced HF team consulted.  PMT consulted for goals and now planned for DC to [facility] when bed available.  Assessment & Plan:   Principal Problem:   Cardiorenal syndrome with renal failure Active Problems:   Diabetes mellitus (HCC)   CAD (coronary artery disease), native coronary artery   Acute on chronic systolic heart failure (HCC)   Acute renal failure with acute tubular necrosis superimposed on stage 4 chronic kidney disease (HCC)   Hypoxia   Shortness of breath   Goals of care, counseling/discussion   Palliative care by specialist   Acute on chronic systolic CHF/suspected ICM  Cardiology follow-up appreciated.  Indicate end-stage HF in context of advanced CKD.  TTE 11/25/2018: LVEF 25-30% with HK of mid LV, akinesis of distal 1/3 of LV  Presented with clinical and radiological CHF flare.  PTA was on Lasix 40 mg on MWF  Started on high-dose IV Lasix 120 mg twice daily and metolazone added.  Recently discharged on dobutamine 2.5 mcg/kg/min, which was continued in the hospital  No beta-blockers due to low output heart failure.  No ACEI/ARB/Entresto/spironolactone or digoxin due to advanced CKD.  Despite aggressive diuresis, not much negative balance or weight loss.  Clinically had improved but remains at  extremely high risk for recurrent decline.  After PMT input, IV dobutamine was discontinued and IV Lasix was switched to Lasix 80 mg orally daily.  Some worsening of dyspnea and worsening of renal insufficiency, high risk of rapid deterioration.  Appropriate for residential hospice.  Comfort medications.  Acute kidney injury on stage IV chronic kidney disease  Creatinine in early June ranging between 3.4-3.6  Presented with creatinine of 4.01, suspect cardiorenal worsening her CKD.  Continue diuresis as noted above.  Creatinine had improved (4.01 > 3.92 > 3.72) but back up to 4.8 today.  Not a long-term HD candidate.  CAD s/p NSTEMI  LHC with DES to mid LAD 08/10/2018.  No ACS.  Continue aspirin, ticagrelor and atorvastatin.  Acute respiratory failure with hypoxia  Due to decompensated CHF.  Oxygen supplementation as needed and wean oxygen as tolerated for oxygen saturations >92%.  Currently saturating in the mid to high 90s on oxygen via nasal cannula at 2 L/min.  Some worsening dyspnea overnight and was placed on HF [place] 4 L/min, now back down to 2 L/min.  Type II DM with renal complications  Recent D5H 9.3.  Mildly uncontrolled and fluctuating.  SSI.  Adult failure to thrive  Due to severe and irreversible complex medical conditions as noted above.  Patient DNR this admission.    PMT coordinated goals of care discussion with patient and family who were able to come into the hospital.  They have finally decided on discharge to [facility].  Awaiting bed at this time.  Discussed  with PMT this morning.  Anemia in CKD  Stable.  Hypokalemia  Replaced.  Magnesium 1.9.   DVT prophylaxis: Subcutaneous heparin Code Status: DNR Family Communication: Discussed with spouse on 6/18, updated care and answered questions. Disposition: Discharged to [facility] when bed available.   Consultants:  Cardiology PMT  Procedures:  None.  Patient has right IJ CVL from  prior to this admission.  Antimicrobials:  None.   Subjective: Patient reports that her breathing was "tricky" last night.  Denies any other new complaints.  Aware that we are waiting for a residential hospice bed at this time for discharge.  ROS: As above, otherwise negative.  Objective:  Vitals:   12/15/18 2027 12/16/18 0023 12/16/18 0500 12/16/18 0524  BP: 91/70 90/66  106/77  Pulse: 97 93  87  Resp: 16 16  16   Temp: 98.6 F (37 C) 98 F (36.7 C)  98 F (36.7 C)  TempSrc: Oral Axillary  Axillary  SpO2: 100% 100%  99%  Weight:  51 kg 51 kg   Height:        Examination:  General exam: Pleasant elderly female, small built and frail, chronically ill looking, with mild DOE Respiratory system: Occasional basal crackles but otherwise clear to auscultation.  No increased work of breathing.  Cardiovascular system: S1 & S2 heard, RRR.  No JVD.  No gallops appreciated today.  No pedal edema or murmurs appreciated.  Telemetry personally reviewed: Sinus rhythm. Gastrointestinal system: Abdomen is nondistended, soft and nontender. No organomegaly or masses felt. Normal bowel sounds heard.  Stable Central nervous system: Alert and oriented. No focal neurological deficits. Extremities: Symmetric 5 x 5 power. Skin: No rashes, lesions or ulcers Psychiatry: Judgement and insight appear normal. Mood & affect appropriate.     Data Reviewed: I have personally reviewed following labs and imaging studies  CBC: Recent Labs  Lab 12/12/18 0639 12/13/18 0648  WBC 7.1 7.2  NEUTROABS  --  6.1  HGB 10.3* 9.7*  HCT 34.5* 31.4*  MCV 90.6 87.5  PLT 472* 169*   Basic Metabolic Panel: Recent Labs  Lab 12/12/18 0639 12/13/18 0648 12/14/18 0307 12/14/18 0709 12/15/18 0345 12/16/18 0316  NA 137 138 136  --  137 134*  K 4.3 4.0 3.0*  --  3.2* 4.5  CL 102 101 97*  --  98 98  CO2 19* 18* 25  --  23 20*  GLUCOSE 226* 253* 202*  --  125* 202*  BUN 47* 47* 47*  --  50* 60*  CREATININE  4.01* 3.92* 3.72*  --  4.18* 4.82*  CALCIUM 9.6 9.8 9.4  --  9.3 9.3  MG  --   --   --  1.9  --   --    Cardiac Enzymes: Recent Labs  Lab 12/12/18 0639  TROPONINI 0.04*   HbA1C: No results for input(s): HGBA1C in the last 72 hours. CBG: Recent Labs  Lab 12/15/18 0537 12/15/18 1135 12/15/18 1641 12/15/18 2125 12/16/18 0623  GLUCAP 138* 232* 152* 212* 149*    Recent Results (from the past 240 hour(s))  Novel Coronavirus,NAA,(SEND-OUT TO REF LAB - TAT 24-48 hrs); Hosp Order     Status: None   Collection Time: 12/12/18  6:18 AM   Specimen: Nasopharyngeal Swab; Respiratory  Result Value Ref Range Status   SARS-CoV-2, NAA NOT DETECTED NOT DETECTED Final    Comment: (NOTE) This test was developed and its performance characteristics determined by [employer]. This test has not been FDA  cleared or approved. This test has been authorized by FDA under an Emergency Use Authorization (EUA). This test is only authorized for the duration of time the declaration that circumstances exist justifying the authorization of the emergency use of in vitro diagnostic tests for detection of SARS-CoV-2 virus and/or diagnosis of COVID-19 infection under section 564(b)(1) of the Act, 21 U.S.C. 510CHE-5(I)(7), unless the authorization is terminated or revoked sooner. When diagnostic testing is negative, the possibility of a false negative result should be considered in the context of a patient's recent exposures and the presence of clinical signs and symptoms consistent with COVID-19. An individual without symptoms of COVID-19 and who is not shedding SARS-CoV-2 virus would expect to have a negative (not detected) result in this assay. Performed  At: [facility] [address] [place], [place] [id number] Rush Farmer MD RW:[id number]    [place]  Final    Comment: Performed at [facility], [place] [address] [place], [place] [address]  SARS  Coronavirus 2     Status: None   Collection Time: 12/12/18  9:22 AM  Result Value Ref Range Status   SARS Coronavirus 2 NOT DETECTED NOT DETECTED Final    Comment: (NOTE) SARS-CoV-2 target nucleic acids are NOT DETECTED. The SARS-CoV-2 RNA is generally detectable in upper and lower respiratory specimens during the acute phase of infection.  Negative  results do not preclude SARS-CoV-2 infection, do not rule out co-infections with other pathogens, and should not be used as the sole basis for treatment or other patient management decisions.  Negative results must be combined with clinical observations, patient history, and epidemiological information. The expected result is Not Detected. Fact Sheet for Patients: http://www.biofiredefense.com/wp-content/uploads/2020/03/BIOFIRE-COVID -19-patients.pdf Fact Sheet for Healthcare Providers: http://www.biofiredefense.com/wp-content/uploads/2020/03/BIOFIRE-COVID -19-hcp.pdf This test is not yet approved or cleared by the [place] and  has been authorized for detection and/or diagnosis of SARS-CoV-2 by FDA under an Emergency Use Authorization (EUA).  This EUA will remain in effec t (meaning this test can be used) for the duration of  the COVID-19 declaration under Section 564(b)(1) of the Act, 21 U.S.C. section 360bbb-3(b)(1), unless the authorization is terminated or revoked sooner. Performed at [facility], [place] [address] [place],  [address]          Radiology Studies: No results found.      Scheduled Meds: . aspirin EC  81 mg Oral Daily  . atorvastatin  40 mg Oral q1800  . feeding supplement (NEPRO CARB STEADY)  237 mL Oral BID BM  . furosemide  80 mg Oral Daily  . heparin  5,000 Units Subcutaneous Q8H  . insulin aspart  0-9 Units Subcutaneous TID WC  . magnesium oxide  400 mg Oral Daily  . pantoprazole  40 mg Oral Daily  . sodium chloride flush  3 mL Intravenous Q12H  . ticagrelor  90 mg Oral BID    Continuous Infusions: . sodium chloride 10 mL/hr at 12/15/18 0600     LOS: 4 days     Vernell Leep, MD, [place], [facility]. [facility]  To contact the attending provider between 7A-7P or the covering provider during after hours 7P-7A, please log into the web site [email or web address] and access using universal [place] password for that web site. If you do not have the password, please call the hospital operator.  12/16/2018, 9:21 AM

## 2018-12-16 NOTE — Plan of Care (Signed)
  Problem: Education: Goal: Ability to demonstrate management of disease process will improve Outcome: Progressing Goal: Ability to verbalize understanding of medication therapies will improve Outcome: Progressing Goal: Individualized Educational Video(s) Outcome: Progressing   Problem: Activity: Goal: Capacity to carry out activities will improve Outcome: Progressing   Problem: Cardiac: Goal: Ability to achieve and maintain adequate cardiopulmonary perfusion will improve Outcome: Progressing   Problem: Health Behavior/Discharge Planning: Goal: Ability to manage health-related needs will improve Outcome: Progressing   Problem: Clinical Measurements: Goal: Will remain free from infection Outcome: Progressing Goal: Diagnostic test results will improve Outcome: Progressing   Problem: Nutrition: Goal: Adequate nutrition will be maintained Outcome: Progressing   Problem: Coping: Goal: Level of anxiety will decrease Outcome: Progressing   Problem: Elimination: Goal: Will not experience complications related to bowel motility Outcome: Progressing Goal: Will not experience complications related to urinary retention Outcome: Progressing   Problem: Pain Managment: Goal: General experience of comfort will improve Outcome: Progressing   Problem: Safety: Goal: Ability to remain free from injury will improve Outcome: Progressing   Problem: Skin Integrity: Goal: Risk for impaired skin integrity will decrease Outcome: Progressing

## 2018-12-16 NOTE — Progress Notes (Signed)
Advanced Heart Failure Rounding Note   Subjective:    Off dobutamine. Feels ok. Had some SOB last night but better today. Weight up 4 pounds.  Creatinine up to 4.8 today  Pending d/c to [facility] place   Objective:   Weight Range:  Vital Signs:   Temp:  [98 F (36.7 C)-98.6 F (37 C)] 98 F (36.7 C) (06/20 0524) Pulse Rate:  [87-97] 90 (06/20 1014) Resp:  [16-18] 18 (06/20 1014) BP: (80-106)/(53-81) 106/81 (06/20 1014) SpO2:  [99 %-100 %] 100 % (06/20 1014) Weight:  [51 kg] 51 kg (06/20 0500) Last BM Date: 12/13/18  Weight change: Filed Weights   12/15/18 0052 12/16/18 0023 12/16/18 0500  Weight: 49.4 kg 51 kg 51 kg    Intake/Output:   Intake/Output Summary (Last 24 hours) at 12/16/2018 1023 Last data filed at 12/16/2018 0600 Gross per 24 hour  Intake 800 ml  Output 600 ml  Net 200 ml     Physical Exam: General:  Weak appearing. No resp difficulty HEENT: normal Neck: supple. JVP up Carotids 2+ bilat; no bruits. No lymphadenopathy or thryomegaly appreciated. Cor: PMI nondisplaced. Regular rate & rhythm. +s3 Lungs: clear Abdomen: soft, nontender, nondistended. No hepatosplenomegaly. No bruits or masses. Good bowel sounds. Extremities: no cyanosis, clubbing, rash, edema Neuro: alert & orientedx3, cranial nerves grossly intact. moves all 4 extremities w/o difficulty. Affect pleasant    Telemetry: Sinus 80-90 Personally reviewed   Labs: Basic Metabolic Panel: Recent Labs  Lab 12/12/18 0639 12/13/18 0648 12/14/18 0307 12/14/18 0709 12/15/18 0345 12/16/18 0316  NA 137 138 136  --  137 134*  K 4.3 4.0 3.0*  --  3.2* 4.5  CL 102 101 97*  --  98 98  CO2 19* 18* 25  --  23 20*  GLUCOSE 226* 253* 202*  --  125* 202*  BUN 47* 47* 47*  --  50* 60*  CREATININE 4.01* 3.92* 3.72*  --  4.18* 4.82*  CALCIUM 9.6 9.8 9.4  --  9.3 9.3  MG  --   --   --  1.9  --   --     Liver Function Tests: No results for input(s): AST, ALT, ALKPHOS, BILITOT, PROT, ALBUMIN in  the last 168 hours. No results for input(s): LIPASE, AMYLASE in the last 168 hours. No results for input(s): AMMONIA in the last 168 hours.  CBC: Recent Labs  Lab 12/12/18 0639 12/13/18 0648  WBC 7.1 7.2  NEUTROABS  --  6.1  HGB 10.3* 9.7*  HCT 34.5* 31.4*  MCV 90.6 87.5  PLT 472* 458*    Cardiac Enzymes: Recent Labs  Lab 12/12/18 0639  TROPONINI 0.04*    BNP: BNP (last 3 results) Recent Labs    09/16/18 0342 11/24/18 1715 12/12/18 0639  BNP 1,254.7* 4,046.9* 1,631.0*    ProBNP (last 3 results) No results for input(s): PROBNP in the last 8760 hours.    Other results:  Imaging: No results found.   Medications:     Scheduled Medications: . aspirin EC  81 mg Oral Daily  . atorvastatin  40 mg Oral q1800  . feeding supplement (NEPRO CARB STEADY)  237 mL Oral BID BM  . furosemide  80 mg Oral Daily  . heparin  5,000 Units Subcutaneous Q8H  . insulin aspart  0-9 Units Subcutaneous TID WC  . magnesium oxide  400 mg Oral Daily  . pantoprazole  40 mg Oral Daily  . sodium chloride flush  3 mL Intravenous  Q12H  . ticagrelor  90 mg Oral BID    Infusions: . sodium chloride 10 mL/hr at 12/15/18 0600    PRN Medications: sodium chloride, acetaminophen, alum & mag hydroxide-simeth, HYDROmorphone (DILAUDID) injection, ondansetron (ZOFRAN) IV, sodium chloride flush, sodium chloride flush, white petrolatum   Assessment/Plan:   1. A/C Systolic HF -> cardiogenic shock , suspect ICM/NICM. ECHO 10/2018 Ef 25-30% .  - On dobutamine 2.5 mcg at home.  - She has end-stage HF and advanced CKD now with worsening HF symptoms and volume overload despite dobutamine support. Co-ox very low yesterday but back up today. Long discussion with her about options. She and her family have met with Palliative Care team (thank you) and have [facility] - Dobutamine stopped. Now with worsening renal failure and volume overload. Will give one dose IV lasix today on top of po -  Pending d/c to [facility]. We discussed possible trajectories of steady worsening at [facility] versus possible temporary stabilization with possible transition to home Hospice from [facility]. Given worsening renal failure suspect 1-2 week trajectory is probably correct  2. AKI on CKD Stage IV.  - Creatinine now 4.8  3. CAD  S/P NSTEMI  - 08/10/18 LHC with DES 80% mLAD - Continue ticagrelor + asa 81 mg daily - off statin   4.DNR -> Hospice - Palliative Care consulted. See discussion above   Length of Stay: 4   Glori Bickers MD 12/16/2018, 10:23 AM  Advanced Heart Failure Team Pager [phone] (M-F; [place])  Please contact [facility] for night-coverage after hours (4p -7a ) and weekends on [email or web address]

## 2018-12-16 NOTE — Progress Notes (Signed)
Advanced Heart Failure Rounding Note   Subjective:    Off dobutamine. Feels ok Denies SOB. Creatinine up to 4.8 today  Pending d/c to [facility] place   Objective:   Weight Range:  Vital Signs:   Temp:  [98 F (36.7 C)-98.6 F (37 C)] 98 F (36.7 C) (06/20 0524) Pulse Rate:  [87-97] 90 (06/20 1014) Resp:  [16-18] 18 (06/20 1014) BP: (80-106)/(53-81) 106/81 (06/20 1014) SpO2:  [99 %-100 %] 100 % (06/20 1014) Weight:  [51 kg] 51 kg (06/20 0500) Last BM Date: 12/13/18  Weight change: Filed Weights   12/15/18 0052 12/16/18 0023 12/16/18 0500  Weight: 49.4 kg 51 kg 51 kg    Intake/Output:   Intake/Output Summary (Last 24 hours) at 12/16/2018 1020 Last data filed at 12/16/2018 0600 Gross per 24 hour  Intake 800 ml  Output 600 ml  Net 200 ml     Physical Exam: General:  Weak appearing. No resp difficulty HEENT: normal Neck: supple. no JVD. Carotids 2+ bilat; no bruits. No lymphadenopathy or thryomegaly appreciated. Cor: PMI nondisplaced. Regular rate & rhythm. +s3 Lungs: clear Abdomen: soft, nontender, nondistended. No hepatosplenomegaly. No bruits or masses. Good bowel sounds. Extremities: no cyanosis, clubbing, rash, edema Neuro: alert & orientedx3, cranial nerves grossly intact. moves all 4 extremities w/o difficulty. Affect pleasant    Telemetry: Sinus 80-90 Personally reviewed   Labs: Basic Metabolic Panel: Recent Labs  Lab 12/12/18 0639 12/13/18 0648 12/14/18 0307 12/14/18 0709 12/15/18 0345 12/16/18 0316  NA 137 138 136  --  137 134*  K 4.3 4.0 3.0*  --  3.2* 4.5  CL 102 101 97*  --  98 98  CO2 19* 18* 25  --  23 20*  GLUCOSE 226* 253* 202*  --  125* 202*  BUN 47* 47* 47*  --  50* 60*  CREATININE 4.01* 3.92* 3.72*  --  4.18* 4.82*  CALCIUM 9.6 9.8 9.4  --  9.3 9.3  MG  --   --   --  1.9  --   --     Liver Function Tests: No results for input(s): AST, ALT, ALKPHOS, BILITOT, PROT, ALBUMIN in the last 168 hours. No results for input(s):  LIPASE, AMYLASE in the last 168 hours. No results for input(s): AMMONIA in the last 168 hours.  CBC: Recent Labs  Lab 12/12/18 0639 12/13/18 0648  WBC 7.1 7.2  NEUTROABS  --  6.1  HGB 10.3* 9.7*  HCT 34.5* 31.4*  MCV 90.6 87.5  PLT 472* 458*    Cardiac Enzymes: Recent Labs  Lab 12/12/18 0639  TROPONINI 0.04*    BNP: BNP (last 3 results) Recent Labs    09/16/18 0342 11/24/18 1715 12/12/18 0639  BNP 1,254.7* 4,046.9* 1,631.0*    ProBNP (last 3 results) No results for input(s): PROBNP in the last 8760 hours.    Other results:  Imaging: No results found.   Medications:     Scheduled Medications: . aspirin EC  81 mg Oral Daily  . atorvastatin  40 mg Oral q1800  . feeding supplement (NEPRO CARB STEADY)  237 mL Oral BID BM  . furosemide  80 mg Oral Daily  . heparin  5,000 Units Subcutaneous Q8H  . insulin aspart  0-9 Units Subcutaneous TID WC  . magnesium oxide  400 mg Oral Daily  . pantoprazole  40 mg Oral Daily  . sodium chloride flush  3 mL Intravenous Q12H  . ticagrelor  90 mg Oral BID  Infusions: . sodium chloride 10 mL/hr at 12/15/18 0600    PRN Medications: sodium chloride, acetaminophen, alum & mag hydroxide-simeth, HYDROmorphone (DILAUDID) injection, ondansetron (ZOFRAN) IV, sodium chloride flush, sodium chloride flush, white petrolatum   Assessment/Plan:   1. A/C Systolic HF -> cardiogenic shock , suspect ICM/NICM. ECHO 10/2018 Ef 25-30% .  - On dobutamine 2.5 mcg at home.  - She has end-stage HF and advanced CKD now with worsening HF symptoms and volume overload despite dobutamine support. Co-ox very low yesterday but back up today. Long discussion with her about options. She and her family have met with Palliative Care team (thank you) and have decided [facility] - Dobutamine stopped. Now with worsening renal failure. Symptomatically stable.  - Pending d/c to [facility]. We discussed possible trajectories of steady worsening at  [facility] versus possible temporary stabilization with possible transition to home Hospice from [facility]. Given worsening renal failure suspect 1-2 week trajectory is probably correct - Continue lasix 80 po daily.   2. AKI on CKD Stage IV.  - Creatinine now 4.8  3. CAD  S/P NSTEMI  - 08/10/18 LHC with DES 80% mLAD - Continue ticagrelor + asa 81 mg daily - off statin   4.DNR -> Hospice - Palliative Care consulted. See discussion above   Length of Stay: 4   Glori Bickers MD 12/16/2018, 10:20 AM  Advanced Heart Failure Team Pager [phone] (M-F; [place])  Please contact [facility] for night-coverage after hours (4p -7a ) and weekends on [email or web address]

## 2018-12-16 NOTE — Progress Notes (Signed)
CSW received notification from [place], [place] [facility], that there are no beds available today at [facility], hopefully within in the next few days.   CSW will continue to follow.   Domenic Schwab, MSW, [place]

## 2018-12-17 DIAGNOSIS — F411 Generalized anxiety disorder: Secondary | ICD-10-CM

## 2018-12-17 LAB — GLUCOSE, CAPILLARY
Glucose-Capillary: 167 mg/dL — ABNORMAL HIGH (ref 70–99)
Glucose-Capillary: 160 mg/dL — ABNORMAL HIGH (ref 70–99)
Glucose-Capillary: 224 mg/dL — ABNORMAL HIGH (ref 70–99)
Glucose-Capillary: 163 mg/dL — ABNORMAL HIGH (ref 70–99)

## 2018-12-17 MED ORDER — MORPHINE SULFATE 10 MG/5ML PO SOLN
5.0000 mg | Freq: Four times a day (QID) | ORAL | Status: DC
  Administered 2018-12-17 – 2018-12-18 (×4): 5 mg via ORAL
  Filled 2018-12-17 (×5): qty 4

## 2018-12-17 MED ORDER — LORAZEPAM 0.5 MG PO TABS
0.5000 mg | ORAL_TABLET | Freq: Every day | ORAL | Status: DC
  Administered 2018-12-17: 0.5 mg via ORAL
  Filled 2018-12-17: qty 1

## 2018-12-17 MED ORDER — MORPHINE SULFATE 10 MG/5ML PO SOLN
5.0000 mg | ORAL | Status: DC | PRN
  Filled 2018-12-17: qty 5

## 2018-12-17 MED ORDER — SALINE SPRAY 0.65 % NA SOLN
1.0000 | NASAL | Status: DC | PRN
  Filled 2018-12-17: qty 44

## 2018-12-17 MED ORDER — LORAZEPAM 0.5 MG PO TABS: 0.5000 mg | ORAL_TABLET | Freq: Four times a day (QID) | ORAL | Status: DC | PRN

## 2018-12-17 NOTE — Progress Notes (Signed)
Per Romona Curls, NP patient will be d/c with her central line when bed is available

## 2018-12-17 NOTE — Progress Notes (Addendum)
PROGRESS NOTE   [name]  XBW:[id number]    DOB: 1950/09/13    DOA: 12/12/2018  PCP: [name], MD   I have briefly reviewed patients previous medical records in [facility].  Brief Narrative:  68 year old female, lives with her wife, mobile with the help of a cane or walker, PMH of DM 2, HTN, COPD not on home oxygen, PEA arrest 08/07/2018 in Cath Lab, CAD s/p DES to mid LAD 1/63/8453, chronic systolic CHF on home dobutamine infusion, stage IV chronic kidney disease, multiple hospitalizations this year related to CHF and related complications, presented to ED on 12/12/2018 due to progressive dyspnea and cough.  Admitted for acute on chronic systolic CHF.  Advanced HF team consulted.  PMT consulted for goals and now planned for DC to [facility] when bed available.  Assessment & Plan:   Principal Problem:   Cardiorenal syndrome with renal failure Active Problems:   Diabetes mellitus (HCC)   CAD (coronary artery disease), native coronary artery   Acute on chronic systolic heart failure (HCC)   Acute renal failure with acute tubular necrosis superimposed on stage 4 chronic kidney disease (HCC)   Hypoxia   Shortness of breath   Goals of care, counseling/discussion   Palliative care by specialist   Acute on chronic systolic CHF/suspected ICM  End-stage HF in context of advanced CKD.  TTE 11/25/2018: LVEF 25-30% with HK of mid LV, akinesis of distal 1/3 of LV  Presented with clinical and radiological CHF flare.  PTA was on Lasix 40 mg on MWF  Started on high-dose IV Lasix 120 mg twice daily and metolazone added.  Recently discharged on dobutamine 2.5 mcg/kg/min, which was continued in the hospital  No beta-blockers due to low output heart failure.  No ACEI/ARB/Entresto/spironolactone or digoxin due to advanced CKD.  Despite aggressive diuresis, not much negative balance or weight loss.  Clinically had improved but remains at extremely high risk for recurrent decline.   After PMT input, IV dobutamine was discontinued and IV Lasix was switched to Lasix 80 mg orally daily.  Since discontinuing above medications, worsening symptoms/heart failure.  Did receive a dose of IV Lasix 120 mg x 1 on 6/20 without much change.  PMT has added scheduled and PRN morphine for pain and dyspnea and Ativan for anxiety and sleep.  Acute kidney injury on stage IV chronic kidney disease  Creatinine in early June ranging between 3.4-3.6  Presented with creatinine of 4.01, suspect cardiorenal worsening her CKD.  Continue diuresis as noted above.  Creatinine had improved (4.01 > 3.92 > 3.72) but back up to 4.8 today.  Not a long-term HD candidate.  No further labs ordered due to comfort care.  CAD s/p NSTEMI  LHC with DES to mid LAD 08/10/2018.  No ACS.  Continue aspirin, ticagrelor and atorvastatin.  Acute respiratory failure with hypoxia  Due to decompensated CHF.  Oxygen supplementation as needed and wean oxygen as tolerated for oxygen saturations >92%.  Currently saturating in the mid to high 90s on oxygen via nasal cannula at 2 L/min.  Titrate oxygen for comfort.  Type II DM with renal complications  Recent M4W 9.3.  CBGs mildly uncontrolled in the 160-180 range.  Due to comfort care, DC CBGs and sliding scale.  Adult failure to thrive  Due to severe and irreversible complex medical conditions as noted above.  Patient DNR this admission.    PMT coordinated goals of care discussion with patient and family who were able to come  into the hospital.  They have finally decided on discharge to [facility].  Awaiting bed at this time.    Anemia in CKD  Stable.  Hypokalemia  Replaced.  Magnesium 1.9.   DVT prophylaxis: Subcutaneous heparin Code Status: DNR Family Communication: Discussed with spouse on 6/18, updated care and answered questions. Disposition: Discharge to [facility] when bed available.  As per CSW follow-up 6/20, did not have a bed  available yesterday and hopefully will have within the next few days.   Consultants:  Cardiology PMT  Procedures:  None.  Patient has right IJ CVL from prior to this admission.  Antimicrobials:  None.   Subjective: Ongoing some dyspnea.  No chest pain reported.  Did not sleep last night.  ROS: As above, otherwise negative.  Objective:  Vitals:   12/16/18 1243 12/16/18 1939 12/17/18 0502 12/17/18 0833  BP: 96/77 97/73 101/69 99/78  Pulse: 67 92 92 91  Resp:  20 20   Temp:  (!) 97.4 F (36.3 C) 98.2 F (36.8 C)   TempSrc:  Oral Oral   SpO2: 100% 99% 99% 100%  Weight:   49.3 kg   Height:        Examination:  General exam: Pleasant elderly female, small built and frail, chronically ill looking, with mild DOE Respiratory system: Occasional basal crackles but otherwise clear to auscultation.  No increased work of breathing.  No change. Cardiovascular system: S1 & S2 heard, RRR.  No JVD.  No gallops appreciated today.  No pedal edema or murmurs appreciated.  No change. Gastrointestinal system: Abdomen is nondistended, soft and nontender. No organomegaly or masses felt. Normal bowel sounds heard.  Stable Central nervous system: Alert and oriented. No focal neurological deficits. Extremities: Symmetric 5 x 5 power. Skin: No rashes, lesions or ulcers Psychiatry: Judgement and insight appear normal. Mood & affect appropriate.     Data Reviewed: I have personally reviewed following labs and imaging studies  CBC: Recent Labs  Lab 12/12/18 0639 12/13/18 0648  WBC 7.1 7.2  NEUTROABS  --  6.1  HGB 10.3* 9.7*  HCT 34.5* 31.4*  MCV 90.6 87.5  PLT 472* 867*   Basic Metabolic Panel: Recent Labs  Lab 12/12/18 0639 12/13/18 0648 12/14/18 0307 12/14/18 0709 12/15/18 0345 12/16/18 0316  NA 137 138 136  --  137 134*  K 4.3 4.0 3.0*  --  3.2* 4.5  CL 102 101 97*  --  98 98  CO2 19* 18* 25  --  23 20*  GLUCOSE 226* 253* 202*  --  125* 202*  BUN 47* 47* 47*  --  50*  60*  CREATININE 4.01* 3.92* 3.72*  --  4.18* 4.82*  CALCIUM 9.6 9.8 9.4  --  9.3 9.3  MG  --   --   --  1.9  --   --    Cardiac Enzymes: Recent Labs  Lab 12/12/18 0639  TROPONINI 0.04*   HbA1C: No results for input(s): HGBA1C in the last 72 hours. CBG: Recent Labs  Lab 12/16/18 1252 12/16/18 1717 12/16/18 2132 12/17/18 0611 12/17/18 1059  GLUCAP 178* 176* 185* 167* 160*    Recent Results (from the past 240 hour(s))  Novel Coronavirus,NAA,(SEND-OUT TO REF LAB - TAT 24-48 hrs); Hosp Order     Status: None   Collection Time: 12/12/18  6:18 AM   Specimen: Nasopharyngeal Swab; Respiratory  Result Value Ref Range Status   SARS-CoV-2, NAA NOT DETECTED NOT DETECTED Final    Comment: (NOTE)  This test was developed and its performance characteristics determined by [employer]. This test has not been FDA cleared or approved. This test has been authorized by FDA under an Emergency Use Authorization (EUA). This test is only authorized for the duration of time the declaration that circumstances exist justifying the authorization of the emergency use of in vitro diagnostic tests for detection of SARS-CoV-2 virus and/or diagnosis of COVID-19 infection under section 564(b)(1) of the Act, 21 U.S.C. 295MWU-1(L)(2), unless the authorization is terminated or revoked sooner. When diagnostic testing is negative, the possibility of a false negative result should be considered in the context of a patient's recent exposures and the presence of clinical signs and symptoms consistent with COVID-19. An individual without symptoms of COVID-19 and who is not shedding SARS-CoV-2 virus would expect to have a negative (not detected) result in this assay. Performed  At: [facility] [address] [place], [place] [id number] Rush Farmer MD DG:[id number]    [place]  Final    Comment: Performed at [facility], [place] [address] [place], [place]  [address]  SARS Coronavirus 2     Status: None   Collection Time: 12/12/18  9:22 AM  Result Value Ref Range Status   SARS Coronavirus 2 NOT DETECTED NOT DETECTED Final    Comment: (NOTE) SARS-CoV-2 target nucleic acids are NOT DETECTED. The SARS-CoV-2 RNA is generally detectable in upper and lower respiratory specimens during the acute phase of infection.  Negative  results do not preclude SARS-CoV-2 infection, do not rule out co-infections with other pathogens, and should not be used as the sole basis for treatment or other patient management decisions.  Negative results must be combined with clinical observations, patient history, and epidemiological information. The expected result is Not Detected. Fact Sheet for Patients: http://www.biofiredefense.com/wp-content/uploads/2020/03/BIOFIRE-COVID -19-patients.pdf Fact Sheet for Healthcare Providers: http://www.biofiredefense.com/wp-content/uploads/2020/03/BIOFIRE-COVID -19-hcp.pdf This test is not yet approved or cleared by the [place] and  has been authorized for detection and/or diagnosis of SARS-CoV-2 by FDA under an Emergency Use Authorization (EUA).  This EUA will remain in effec t (meaning this test can be used) for the duration of  the COVID-19 declaration under Section 564(b)(1) of the Act, 21 U.S.C. section 360bbb-3(b)(1), unless the authorization is terminated or revoked sooner. Performed at [facility], [place] [address] [place], [place] [address]          Radiology Studies: No results found.      Scheduled Meds: . aspirin EC  81 mg Oral Daily  . feeding supplement (NEPRO CARB STEADY)  237 mL Oral BID BM  . furosemide  80 mg Oral Daily  . heparin  5,000 Units Subcutaneous Q8H  . insulin aspart  0-9 Units Subcutaneous TID WC  . LORazepam  0.5 mg Oral QHS  . morphine  5 mg Oral Q6H  . pantoprazole  40 mg Oral Daily  . sodium chloride flush  3 mL Intravenous Q12H  . ticagrelor  90 mg Oral BID    Continuous Infusions: . sodium chloride 10 mL/hr at 12/15/18 0600     LOS: 5 days     Vernell Leep, MD, [place], [facility]. [facility]  To contact the attending provider between 7A-7P or the covering provider during after hours 7P-7A, please log into the web site [email or web address] and access using universal [place] password for that web site. If you do not have the password, please call the hospital operator.  12/17/2018, 11:11 AM

## 2018-12-17 NOTE — Progress Notes (Signed)
Advanced Heart Failure Rounding Note   Subjective:    Off dobutamine.   Did not sleep at all last night. Denies orthopnea or PND. Says she just couldn't sleep. No labs today   Objective:   Weight Range:  Vital Signs:   Temp:  [97.4 F (36.3 C)-98.2 F (36.8 C)] 98.2 F (36.8 C) (06/21 0502) Pulse Rate:  [67-92] 91 (06/21 0833) Resp:  [18-20] 20 (06/21 0502) BP: (96-106)/(69-81) 99/78 (06/21 0833) SpO2:  [99 %-100 %] 100 % (06/21 0833) Weight:  [49.3 kg] 49.3 kg (06/21 0502) Last BM Date: 12/15/18  Weight change: Filed Weights   12/16/18 0023 12/16/18 0500 12/17/18 0502  Weight: 51 kg 51 kg 49.3 kg    Intake/Output:   Intake/Output Summary (Last 24 hours) at 12/17/2018 0907 Last data filed at 12/17/2018 0600 Gross per 24 hour  Intake 522 ml  Output 700 ml  Net -178 ml     Physical Exam: General:  Weak appearing. No resp difficulty HEENT: normal Neck: supple. JVP 9 Carotids 2+ bilat; no bruits. No lymphadenopathy or thryomegaly appreciated. Cor: PMI nondisplaced. Regular rate & rhythm. +s3 Lungs: clear Abdomen: soft, nontender, nondistended. No hepatosplenomegaly. No bruits or masses. Good bowel sounds. Extremities: no cyanosis, clubbing, rash, edema cool Neuro: alert & orientedx3, cranial nerves grossly intact. moves all 4 extremities w/o difficulty. Affect pleasant     Telemetry: Sinus 90s Personally reviewed   Labs: Basic Metabolic Panel: Recent Labs  Lab 12/12/18 0639 12/13/18 0648 12/14/18 0307 12/14/18 0709 12/15/18 0345 12/16/18 0316  NA 137 138 136  --  137 134*  K 4.3 4.0 3.0*  --  3.2* 4.5  CL 102 101 97*  --  98 98  CO2 19* 18* 25  --  23 20*  GLUCOSE 226* 253* 202*  --  125* 202*  BUN 47* 47* 47*  --  50* 60*  CREATININE 4.01* 3.92* 3.72*  --  4.18* 4.82*  CALCIUM 9.6 9.8 9.4  --  9.3 9.3  MG  --   --   --  1.9  --   --     Liver Function Tests: No results for input(s): AST, ALT, ALKPHOS, BILITOT, PROT, ALBUMIN in the last  168 hours. No results for input(s): LIPASE, AMYLASE in the last 168 hours. No results for input(s): AMMONIA in the last 168 hours.  CBC: Recent Labs  Lab 12/12/18 0639 12/13/18 0648  WBC 7.1 7.2  NEUTROABS  --  6.1  HGB 10.3* 9.7*  HCT 34.5* 31.4*  MCV 90.6 87.5  PLT 472* 458*    Cardiac Enzymes: Recent Labs  Lab 12/12/18 0639  TROPONINI 0.04*    BNP: BNP (last 3 results) Recent Labs    09/16/18 0342 11/24/18 1715 12/12/18 0639  BNP 1,254.7* 4,046.9* 1,631.0*    ProBNP (last 3 results) No results for input(s): PROBNP in the last 8760 hours.    Other results:  Imaging: No results found.   Medications:     Scheduled Medications: . aspirin EC  81 mg Oral Daily  . atorvastatin  40 mg Oral q1800  . feeding supplement (NEPRO CARB STEADY)  237 mL Oral BID BM  . furosemide  80 mg Oral Daily  . heparin  5,000 Units Subcutaneous Q8H  . insulin aspart  0-9 Units Subcutaneous TID WC  . magnesium oxide  400 mg Oral Daily  . pantoprazole  40 mg Oral Daily  . sodium chloride flush  3 mL Intravenous Q12H  .  ticagrelor  90 mg Oral BID    Infusions: . sodium chloride 10 mL/hr at 12/15/18 0600    PRN Medications: sodium chloride, acetaminophen, alum & mag hydroxide-simeth, HYDROmorphone (DILAUDID) injection, ondansetron (ZOFRAN) IV, sodium chloride flush, sodium chloride flush, white petrolatum   Assessment/Plan:   1. A/C Systolic HF -> cardiogenic shock , suspect ICM/NICM. ECHO 10/2018 Ef 25-30% .  - On dobutamine 2.5 mcg at home.  - She has end-stage HF and advanced CKD now with worsening HF symptoms and volume overload despite dobutamine support. Co-ox very low yesterday but back up today. Long discussion with her about options. She and her family have met with Palliative Care team (thank you) and have decided on [facility] - Dobutamine stopped.  - She looks worse today. Unable to sleep extremities cool. Suspect related to worsening HF and renal failure.  Likely with some degree of acidosis - Awaiting bed at [facility]. May need to start comfort measures here if she continues to deteriorate  2. AKI on CKD Stage IV.  - Creatinine 4.8 on 6/20. No labs today  3. CAD  S/P NSTEMI  - 08/10/18 LHC with DES 80% mLAD - Continue ticagrelor + asa 81 mg daily - off statin   4.DNR -> Hospice - Awaiting bed at [facility] of Stay: 5   Glori Bickers MD 12/17/2018, 9:07 AM  Advanced Heart Failure Team Pager [phone] (M-F; [place])  Please contact [facility] for night-coverage after hours (4p -7a ) and weekends on [email or web address]

## 2018-12-17 NOTE — Progress Notes (Signed)
  Palliative medicine progress note  Patient seen, chart reviewed.  Patient looks more debilitated with them when I met her on Friday, 12/15/2018.  She states she feels constantly short of breath and did not sleep at all last night.  I reviewed patient's MAR, and noticed that no medication has been given to help alleviate shortness of breath (she has Dilaudid IV ordered)  I have also reached out to hospice and palliative care of [facility].  They do not have a bed today but have committed to admission tomorrow morning, 12/18/2018.  Patient is agreeable to medication adjustments to help her breathing and help her sleep and relax.  Reviewed with her and education provided regarding opioids not only to help manage pain but also dyspnea.  I asked her if she was afraid to go to sleep at night and she replied no but "have a lot of things on my mind"  Patient Profile/HPI:68 y.o.femalewith past medical history of CAD, CHF (EF 20-25%), COPD, PEA arrest, CKD stg V,who was admitted on 6/16/2020with increase SOB.She has had multiple admissions recently for heart failure and was discharged on 12/05/2018 on dobutamine and IV lasix in the home. Initial work up today reveals a BNP of 1631, bilateral pulmonary edema on CXR, and GFR of less than 12.  Despite aggressive measures including restarting dobutamine drip her creatinine has continued to worsen.  Palliative medicine team is been in contact with [name] as well as her family regarding residential hospice and they wish to move forward with [facility] for end-of-life care  Plan We will start scheduled morphine concentrate, add PRN oral agent and continue with IV Dilaudid as needed for breakthrough shortness of breath Add scheduled Ativan at bedtime and as needed Have discussed case with hospice and palliative care of [facility] liaison; they have committed to a bed on 12/18/2018 in a.m.  Updated Dr. Algis Liming  Prognosis Patient continues  to decline while in the hospital with more shortness of breath, increased anxiety.  She has likely less than 2 weeks now in the setting of cardiorenal syndrome, systolic heart failure with an EF of 25 to 30%, chronic kidney disease stage IV.  Patient is not pursuing hemodialysis  Disposition [facility] on 12/18/2018  Thank you Jean Curls, NP Time: 35 minutes Greater than 50% of time was spent in counseling and coordination of care

## 2018-12-18 DIAGNOSIS — Z515 Encounter for palliative care: Secondary | ICD-10-CM

## 2018-12-18 DIAGNOSIS — R0902 Hypoxemia: Secondary | ICD-10-CM

## 2018-12-18 MED ORDER — FUROSEMIDE 40 MG PO TABS: 80.0000 mg | ORAL_TABLET | Freq: Every day | ORAL | Status: AC

## 2018-12-18 MED ORDER — HEPARIN SOD (PORK) LOCK FLUSH 100 UNIT/ML IV SOLN
250.0000 [IU] | INTRAVENOUS | Status: AC | PRN
  Administered 2018-12-18: 250 [IU]

## 2018-12-18 MED ORDER — MORPHINE SULFATE 10 MG/5ML PO SOLN: 5.0000 mg | ORAL | Status: AC | PRN

## 2018-12-18 MED ORDER — NEPRO/CARBSTEADY PO LIQD: 237.0000 mL | Freq: Two times a day (BID) | ORAL | Status: AC

## 2018-12-18 MED ORDER — MORPHINE SULFATE 10 MG/5ML PO SOLN: 5.0000 mg | Freq: Four times a day (QID) | ORAL | Status: AC

## 2018-12-18 MED ORDER — LORAZEPAM 0.5 MG PO TABS: 0.5000 mg | ORAL_TABLET | Freq: Every day | ORAL | Status: AC

## 2018-12-18 MED ORDER — LORAZEPAM 0.5 MG PO TABS: 0.5000 mg | ORAL_TABLET | Freq: Four times a day (QID) | ORAL | Status: AC | PRN

## 2018-12-18 NOTE — Progress Notes (Signed)
[facility])  [facility] room available today. Meeting with family at [facility] at noon to complete paper work for transfer today. Will update CSW when paper work is complete.   Will need discharge summary sent to [phone].  RN please call report to [facility] prior to patient leaving the unit at [phone].  Thank you,  Erling Conte, LCSW [phone]

## 2018-12-18 NOTE — Discharge Summary (Signed)
Physician Discharge Summary  [name] OEV:[id number] DOB: 01-10-1951  PCP: [name], MD  Admit date: 12/12/2018 Discharge date: 12/18/2018  Recommendations for Outpatient Follow-up:  1. MD at [facility] for ongoing residential hospice care.  Home Health: None Equipment/Devices: None  Discharge Condition: Poor prognosis and at very high risk for decline and demise. CODE STATUS: DNR Diet recommendation: Heart healthy & diabetic diet.  Discharge Diagnoses:  Principal Problem:   Cardiorenal syndrome with renal failure Active Problems:   Diabetes mellitus (HCC)   CAD (coronary artery disease), native coronary artery   Acute on chronic systolic heart failure (HCC)   Acute renal failure with acute tubular necrosis superimposed on stage 4 chronic kidney disease (HCC)   Hypoxia   Shortness of breath   Terminal care   Palliative care by specialist   Anxiety state   Brief Summary: 68 year old female, lives with her wife, mobile with the help of a cane or walker, PMH of DM 2, HTN, COPD not on home oxygen, PEA arrest 08/07/2018 in Cath Lab, CAD s/p DES to mid LAD 02/23/9370, chronic systolic CHF on home dobutamine infusion, stage IV chronic kidney disease, multiple hospitalizations this year related to CHF and related complications, presented to ED on 12/12/2018 due to progressive dyspnea and cough.  Admitted for acute on chronic systolic CHF.  Advanced HF team consulted, briefly treated aggressively without significant improvement, then PMT was consulted and transitioned to comfort care and discharged to residential hospice.    Assessment & Plan:  Acute on chronic systolic CHF/suspected ICM  End-stage HF complicating advanced CKD.  TTE 11/25/2018: LVEF 25-30% with HK of mid LV, akinesis of distal 1/3 of LV  Presented with clinical and radiological CHF flare.  PTA was on Lasix 40 mg on MWF  Started on high-dose IV Lasix 120 mg twice daily and metolazone added.  Recently  discharged on dobutamine 2.5 mcg/kg/min, which was continued in the hospital  No beta-blockers due to low output heart failure.  No ACEI/ARB/Entresto/spironolactone or digoxin due to advanced CKD.  Advanced HF team consulted, briefly treated aggressively with IV diuresis, IV dobutamine without significant improvement, then PMT was consulted and patient was transitioned to comfort care and discharged to residential hospice.    Acute kidney injury on stage IV chronic kidney disease  Creatinine in early June ranging between 3.4-3.6  Presented with creatinine of 4.01, suspect cardiorenal worsening her CKD.  Creatinine had briefly improved (4.01 > 3.92 > 3.72) but back up to 4.8.  Not a long-term HD candidate.  Not checking labs going forward due to comfort care.  CAD s/p NSTEMI  LHC with DES to mid LAD 08/10/2018.  No ACS.  Continue aspirin, ticagrelor.  Statins discontinued.  Acute respiratory failure with hypoxia  Due to decompensated CHF.  Continue oxygen for comfort at discharge.  Type II DM with renal complications  Recent I9C 9.3.  Was not on any medications PTA.  No medications initiated at discharge due to comfort care.  Adult failure to thrive  Due to severe and irreversible complex medical conditions as noted above.  Patient DNR this admission.    PMT coordinated goals of care discussion with patient and family who were able to come into the hospital.  They finally decided on discharge to [facility] and patient has a bed there today.   Anemia in CKD  Stable.  Hypokalemia  Replaced.  Magnesium 1.9.   Consultants:  Cardiology PMT  Procedures:  Patient has right IJ CVL from prior  to this admission and she will be discharged with central line to [facility] which can be accessed if they need to start any infusions i.e morphine.   Discharge Instructions  Discharge Instructions    (HEART FAILURE PATIENTS) Call MD:  Anytime you have any of  the following symptoms: 1) 3 pound weight gain in 24 hours or 5 pounds in 1 week 2) shortness of breath, with or without a dry hacking cough 3) swelling in the hands, feet or stomach 4) if you have to sleep on extra pillows at night in order to breathe.   Complete by: As directed    Call MD for:  difficulty breathing, headache or visual disturbances   Complete by: As directed    Call MD for:  extreme fatigue   Complete by: As directed    Call MD for:  persistant dizziness or light-headedness   Complete by: As directed    Call MD for:  persistant nausea and vomiting   Complete by: As directed    Call MD for:  severe uncontrolled pain   Complete by: As directed    Call MD for:  temperature >100.4   Complete by: As directed    Diet - low sodium heart healthy   Complete by: As directed    Diet Carb Modified   Complete by: As directed    Increase activity slowly   Complete by: As directed        Medication List    STOP taking these medications   atorvastatin 40 MG tablet Commonly known as: LIPITOR   Magnesium Oxide 400 (240 Mg) MG Tabs   potassium chloride 10 MEQ tablet Commonly known as: K-DUR     TAKE these medications   aspirin 81 MG chewable tablet Chew 1 tablet (81 mg total) by mouth daily.   docusate sodium 100 MG capsule Commonly known as: Colace Take 1 capsule (100 mg total) by mouth 2 (two) times daily.   feeding supplement (NEPRO CARB STEADY) Liqd Take 237 mLs by mouth 2 (two) times daily between meals.   furosemide 40 MG tablet Commonly known as: LASIX Take 2 tablets (80 mg total) by mouth daily. What changed:   how much to take  when to take this  additional instructions   LORazepam 0.5 MG tablet Commonly known as: ATIVAN Take 1 tablet (0.5 mg total) by mouth at bedtime.   LORazepam 0.5 MG tablet Commonly known as: ATIVAN Take 1-2 tablets (0.5-1 mg total) by mouth every 6 (six) hours as needed for anxiety or sleep.   morphine 10 MG/5ML  solution Take 2.5 mLs (5 mg total) by mouth every 6 (six) hours.   morphine 10 MG/5ML solution Take 2.5-5 mLs (5-10 mg total) by mouth every 3 (three) hours as needed for moderate pain or severe pain (dyspnea).   pantoprazole 40 MG tablet Commonly known as: PROTONIX Take 1 tablet (40 mg total) by mouth daily.   ticagrelor 90 MG Tabs tablet Commonly known as: BRILINTA Take 1 tablet (90 mg total) by mouth 2 (two) times daily.      Follow-up Information    [name], MD Follow up.   Specialty: Family Medicine Why: [place] Fresh information: [place]  [phone]        MD at [facility] Follow up.   Why: For ongoing Hospice care.         No Known Allergies    Procedures/Studies: Dg Chest 2 View  Result Date:  11/24/2018 CLINICAL DATA:  Pt reports she had a televisit with MD today who advised her to come to ED. Pt here for eval of fluid retention (has not been taking lasix as directed), pt also reports decrease in urination with some shortness of breath. Pt had .*comment was truncated*sob EXAM: CHEST - 2 VIEW COMPARISON:  Radiograph 11/21/2018 FINDINGS: Normal cardiac silhouette. Some improvement in the bilateral effusions. Persistent effusion on the RIGHT. Mild central venous congestion. No focal infiltrate. No overt pulmonary edema. IMPRESSION: 1. Central venous congestion and RIGHT pleural effusion. 2. Effusions and venous congestion appear slightly improved from prior but overall similar. Electronically Signed   By: Suzy Bouchard M.D.   On: 11/24/2018 18:34   Dg Chest 2 View  Result Date: 11/21/2018 CLINICAL DATA:  Severe shortness of breath. EXAM: CHEST - 2 VIEW COMPARISON:  09/16/2018. FINDINGS: Mediastinum hilar structures normal. Stable cardiomegaly. Bilateral pulmonary infiltrates/edema again noted. Small bilateral pleural effusions, right side greater than left,. IMPRESSION: Stable cardiomegaly. Bilateral pulmonary  infiltrates/edema again noted. Small bilateral pulmonary infiltrates, right side greater than left, noted. CHF and/or bilateral pneumonia could present this fashion. Electronically Signed   By: Marcello Moores  Register   On: 11/21/2018 14:06   US Renal  Result Date: 11/24/2018 CLINICAL DATA:  Renal failure EXAM: RENAL / URINARY TRACT ULTRASOUND COMPLETE COMPARISON:  None. FINDINGS: Right Kidney: Renal measurements: 10.3 x 4.4 x 5.3 cm = volume: 126 mL. Mildly echogenic. No hydronephrosis or mass. Left Kidney: Renal measurements: 10.3 x 4.9 x 4.6 cm = volume: 121 mL. Mildly echogenic. No solid renal mass. Lower pole cyst measures 1.4 x 1.4 x 1.4 cm. Bladder: Appears normal for degree of bladder distention. IMPRESSION: 1. Mildly hyperechoic kidneys, compatible with chronic medical renal disease. 2. No hydronephrosis. Electronically Signed   By: Ulyses Jarred M.D.   On: 11/24/2018 18:56   Ir Fluoro Guide Cv Line Right  Result Date: 12/04/2018 INDICATION: Chronic CHF, access for continuous IV dobutamine therapy EXAM: TUNNELED PICC LINE WITH ULTRASOUND AND FLUOROSCOPIC GUIDANCE MEDICATIONS: 1% lidocaine local. ANESTHESIA/SEDATION: Moderate Sedation Time:  None. The patient was continuously monitored during the procedure by the interventional radiology nurse under my direct supervision. FLUOROSCOPY TIME:  Fluoroscopy Time: 0 minutes 18 seconds (1 mGy). COMPLICATIONS: None immediate. PROCEDURE: Informed written consent was obtained from the patient after a discussion of the risks, benefits, and alternatives to treatment. Questions regarding the procedure were encouraged and answered. The right neck and chest were prepped with chlorhexidine in a sterile fashion, and a sterile drape was applied covering the operative field. Maximum barrier sterile technique with sterile gowns and gloves were used for the procedure. A timeout was performed prior to the initiation of the procedure. After creating a small venotomy incision, a  micropuncture kit was utilized to access the right internal jugular vein under direct, real-time ultrasound guidance after the overlying soft tissues were anesthetized with 1% lidocaine with epinephrine. Ultrasound image documentation was performed. The microwire was kinked to measure appropriate catheter length. The micropuncture sheath was exchanged for a peel-away sheath over a guidewire. A 5 French single lumen tunneled PICC measuring 23 cm was tunneled in a retrograde fashion from the anterior chest wall to the venotomy incision. The catheter was then placed through the peel-away sheath with tip ultimately positioned at the superior caval-atrial junction. Final catheter positioning was confirmed and documented with a spot radiographic image. The catheter aspirates and flushes normally. The catheter was flushed with appropriate volume heparin dwells. The catheter exit site  was secured with a 0-Prolene retention suture. The venotomy incision was closed with Dermabond. Dressings were applied. The patient tolerated the procedure well without immediate post procedural complication. FINDINGS: After catheter placement, the tip lies within the superior cavoatrial junction. The catheter aspirates and flushes normally and is ready for immediate use. IMPRESSION: Successful placement of 23cm single lumen tunneled PICC catheter via the right internal jugular vein with tip terminating at the superior caval atrial junction. The catheter is ready for immediate use. Electronically Signed   By: Jerilynn Mages.  Shick M.D.   On: 12/04/2018 15:00   Ir US Guide Vasc Access Right  Result Date: 12/04/2018 INDICATION: Chronic CHF, access for continuous IV dobutamine therapy EXAM: TUNNELED PICC LINE WITH ULTRASOUND AND FLUOROSCOPIC GUIDANCE MEDICATIONS: 1% lidocaine local. ANESTHESIA/SEDATION: Moderate Sedation Time:  None. The patient was continuously monitored during the procedure by the interventional radiology nurse under my direct  supervision. FLUOROSCOPY TIME:  Fluoroscopy Time: 0 minutes 18 seconds (1 mGy). COMPLICATIONS: None immediate. PROCEDURE: Informed written consent was obtained from the patient after a discussion of the risks, benefits, and alternatives to treatment. Questions regarding the procedure were encouraged and answered. The right neck and chest were prepped with chlorhexidine in a sterile fashion, and a sterile drape was applied covering the operative field. Maximum barrier sterile technique with sterile gowns and gloves were used for the procedure. A timeout was performed prior to the initiation of the procedure. After creating a small venotomy incision, a micropuncture kit was utilized to access the right internal jugular vein under direct, real-time ultrasound guidance after the overlying soft tissues were anesthetized with 1% lidocaine with epinephrine. Ultrasound image documentation was performed. The microwire was kinked to measure appropriate catheter length. The micropuncture sheath was exchanged for a peel-away sheath over a guidewire. A 5 French single lumen tunneled PICC measuring 23 cm was tunneled in a retrograde fashion from the anterior chest wall to the venotomy incision. The catheter was then placed through the peel-away sheath with tip ultimately positioned at the superior caval-atrial junction. Final catheter positioning was confirmed and documented with a spot radiographic image. The catheter aspirates and flushes normally. The catheter was flushed with appropriate volume heparin dwells. The catheter exit site was secured with a 0-Prolene retention suture. The venotomy incision was closed with Dermabond. Dressings were applied. The patient tolerated the procedure well without immediate post procedural complication. FINDINGS: After catheter placement, the tip lies within the superior cavoatrial junction. The catheter aspirates and flushes normally and is ready for immediate use. IMPRESSION: Successful  placement of 23cm single lumen tunneled PICC catheter via the right internal jugular vein with tip terminating at the superior caval atrial junction. The catheter is ready for immediate use. Electronically Signed   By: Jerilynn Mages.  Shick M.D.   On: 12/04/2018 15:00   Dg Chest Portable 1 View  Result Date: 12/12/2018 CLINICAL DATA:  Shortness of breath. EXAM: PORTABLE CHEST 1 VIEW COMPARISON:  11/24/2018. FINDINGS: Central line noted with tip over the cavoatrial junction. Stable cardiomegaly. Progressive bilateral pulmonary infiltrates most consistent bilateral pulmonary edema noted. Bilateral pneumonia cannot be excluded. Persistent right pleural effusion. New left pleural effusion. These findings are most consistent with CHF. No pneumothorax. IMPRESSION: 1 central line noted with tip over cavoatrial junction. 2. Cardiomegaly with bilateral pulmonary infiltrates most consistent with bilateral pulmonary edema. Bilateral pneumonia cannot be excluded. Persistent right pleural effusion. New left pleural effusion. These findings together most consistent with CHF. Electronically Signed   By: Marcello Moores  Register  On: 12/12/2018 07:00      Subjective: Patient reports that she feels much better.  Dyspnea improved and actually did not complain of dyspnea this morning.  Also slept well last night.  No complaints reported.  She is aware that she is being discharged to [facility] today.  Discharge Exam:  Vitals:   12/17/18 1705 12/17/18 2058 12/18/18 0414 12/18/18 0848  BP: 98/81 96/75 101/76 96/69  Pulse: 87 83 82 79  Resp:  '18 18 18  '$ Temp:   (!) 97.4 F (36.3 C)   TempSrc:   Oral   SpO2: 100% 100% 100%   Weight:   49.1 kg   Height:        General exam: Pleasant elderly female, small built and frail, chronically ill looking, lying comfortably propped up in bed.  Looks comfortable and in no distress which is significant improvement compared to the last 2 days. Respiratory system: clear to auscultation.  No  increased work of breathing.   Cardiovascular system: S1 & S2 heard, RRR.  No JVD.    S3 gallop +.  No pedal edema or murmurs appreciated.   Gastrointestinal system: Abdomen is nondistended, soft and nontender. No organomegaly or masses felt. Normal bowel sounds heard.  Central nervous system: Alert and oriented. No focal neurological deficits. Extremities: Symmetric 5 x 5 power. Skin: No rashes, lesions or ulcers Psychiatry: Judgement and insight appear normal. Mood & affect appropriate.     The results of significant diagnostics from this hospitalization (including imaging, microbiology, ancillary and laboratory) are listed below for reference.     Microbiology: Recent Results (from the past 240 hour(s))  Novel Coronavirus,NAA,(SEND-OUT TO REF LAB - TAT 24-48 hrs); Hosp Order     Status: None   Collection Time: 12/12/18  6:18 AM   Specimen: Nasopharyngeal Swab; Respiratory  Result Value Ref Range Status   SARS-CoV-2, NAA NOT DETECTED NOT DETECTED Final    Comment: (NOTE) This test was developed and its performance characteristics determined by [employer]. This test has not been FDA cleared or approved. This test has been authorized by FDA under an Emergency Use Authorization (EUA). This test is only authorized for the duration of time the declaration that circumstances exist justifying the authorization of the emergency use of in vitro diagnostic tests for detection of SARS-CoV-2 virus and/or diagnosis of COVID-19 infection under section 564(b)(1) of the Act, 21 U.S.C. 456YBW-3(S)(9), unless the authorization is terminated or revoked sooner. When diagnostic testing is negative, the possibility of a false negative result should be considered in the context of a patient's recent exposures and the presence of clinical signs and symptoms consistent with COVID-19. An individual without symptoms of COVID-19 and who is not shedding SARS-CoV-2 virus would expect to have a  negative (not detected) result in this assay. Performed  At: [facility] [address] [place], [place] [id number] Rush Farmer MD TL:[id number]    [place]  Final    Comment: Performed at [facility], [place] [address] [place], [place] [address]  SARS Coronavirus 2     Status: None   Collection Time: 12/12/18  9:22 AM  Result Value Ref Range Status   SARS Coronavirus 2 NOT DETECTED NOT DETECTED Final    Comment: (NOTE) SARS-CoV-2 target nucleic acids are NOT DETECTED. The SARS-CoV-2 RNA is generally detectable in upper and lower respiratory specimens during the acute phase of infection.  Negative  results do not preclude SARS-CoV-2 infection, do not rule out co-infections  with other pathogens, and should not be used as the sole basis for treatment or other patient management decisions.  Negative results must be combined with clinical observations, patient history, and epidemiological information. The expected result is Not Detected. Fact Sheet for Patients: http://www.biofiredefense.com/wp-content/uploads/2020/03/BIOFIRE-COVID -19-patients.pdf Fact Sheet for Healthcare Providers: http://www.biofiredefense.com/wp-content/uploads/2020/03/BIOFIRE-COVID -19-hcp.pdf This test is not yet approved or cleared by the [place] and  has been authorized for detection and/or diagnosis of SARS-CoV-2 by FDA under an Emergency Use Authorization (EUA).  This EUA will remain in effec t (meaning this test can be used) for the duration of  the COVID-19 declaration under Section 564(b)(1) of the Act, 21 U.S.C. section 360bbb-3(b)(1), unless the authorization is terminated or revoked sooner. Performed at [facility], [place] [address] [place], [place] [address]      Labs: CBC: Recent Labs  Lab 12/12/18 [phone] 12/13/18 0648  WBC 7.1 7.2  NEUTROABS  --  6.1  HGB 10.3* 9.7*  HCT 34.5* 31.4*  MCV 90.6 87.5  PLT 472* 458*   Basic  Metabolic Panel: Recent Labs  Lab 12/12/18 0639 12/13/18 0648 12/14/18 0307 12/14/18 0709 12/15/18 0345 12/16/18 0316  NA 137 138 136  --  137 134*  K 4.3 4.0 3.0*  --  3.2* 4.5  CL 102 101 97*  --  98 98  CO2 19* 18* 25  --  23 20*  GLUCOSE 226* 253* 202*  --  125* 202*  BUN 47* 47* 47*  --  50* 60*  CREATININE 4.01* 3.92* 3.72*  --  4.18* 4.82*  CALCIUM 9.6 9.8 9.4  --  9.3 9.3  MG  --   --   --  1.9  --   --    Liver Function Tests: No results for input(s): AST, ALT, ALKPHOS, BILITOT, PROT, ALBUMIN in the last 168 hours. BNP (last 3 results) Recent Labs    09/16/18 0342 11/24/18 1715 12/12/18 0639  BNP 1,254.7* 4,046.9* 1,631.0*   Cardiac Enzymes: Recent Labs  Lab 12/12/18 0639  TROPONINI 0.04*   CBG: Recent Labs  Lab 12/16/18 2132 12/17/18 0611 12/17/18 1059 12/17/18 1634 12/17/18 2214  GLUCAP 185* 167* 160* 224* 163*   Urinalysis    Component Value Date/Time   COLORURINE YELLOW 11/28/2018 2135   APPEARANCEUR HAZY (A) 11/28/2018 2135   LABSPEC 1.008 11/28/2018 2135   PHURINE 5.0 11/28/2018 2135   GLUCOSEU NEGATIVE 11/28/2018 2135   HGBUR MODERATE (A) 11/28/2018 2135   BILIRUBINUR NEGATIVE 11/28/2018 2135   KETONESUR NEGATIVE 11/28/2018 2135   PROTEINUR NEGATIVE 11/28/2018 2135   NITRITE NEGATIVE 11/28/2018 2135   LEUKOCYTESUR LARGE (A) 11/28/2018 2135      Time coordinating discharge: 40 minutes  SIGNED:  Vernell Leep, MD, FACP, [facility]. [facility]  To contact the attending provider between 7A-7P or the covering provider during after hours 7P-7A, please log into the web site [email or web address] and access using universal [place] password for that web site. If you do not have the password, please call the hospital operator.

## 2018-12-18 NOTE — Progress Notes (Signed)
PT Cancellation Note  Patient Details Name: [name] MRN: [id number] DOB: 1950/07/05   Cancelled Treatment:    Reason Eval/Treat Not Completed: Other (comment)(noted palliative note for transition to end of life care and hospice bed anticipated this date. Will sign off)   Zachary Lovins B [name] 12/18/2018, 7:22 AM  Elwyn Reach, PT Acute Rehabilitation Services Pager: [phone] Office: [phone]

## 2018-12-18 NOTE — Discharge Instructions (Signed)

## 2018-12-18 NOTE — TOC Transition Note (Signed)
Transition of Care [facility]) - CM/SW Discharge Note   Patient Details  Name: Shaunessy Dobratz MRN: [id number] Date of Birth: 06-22-1951  [facility] CM/SW Contact:  Candie Chroman, LCSW Phone Number: 12/18/2018, 12:32 PM   Clinical Narrative: CSW facilitated patient discharge including contacting patient family and facility to confirm patient discharge plans. Clinical information faxed to facility and family agreeable with plan. CSW arranged ambulance transport via [facility] to [facility]. RN to call report prior to discharge [phone]).  CSW will sign off for now as social work intervention is no longer needed. Please consult [place] again if new needs arise.  Final next level of care: [place] Barriers to Discharge: Barriers Resolved   Patient Goals and CMS Choice        Discharge Placement              Patient chooses bed at: [facility]) Patient to be transferred to facility by: [place] Name of family member notified: Patient has already notified her wife, Rudene Anda Patient and family notified of of transfer: 12/18/18  Discharge Plan and Services                                     Social Determinants of Health (SDOH) Interventions     Readmission Risk Interventions Readmission Risk Prevention Plan 12/05/2018  Transportation Screening Complete  PCP or Specialist Appt within 3-5 Days Complete  HRI or [place] Complete  Social Work Consult for [place] Planning/Counseling Complete  Palliative Care Screening Not Applicable  Medication Review [employer]) Complete

## 2018-12-18 NOTE — Care Management Important Message (Signed)
Important Message  Patient Details  Name: [name] MRN: [id number] Date of Birth: [birth year]   Medicare Important Message Given:  Yes     Joscelin, Fray 12/18/2018, 12:51 PM

## 2018-12-28 ENCOUNTER — Inpatient Hospital Stay (HOSPITAL_COMMUNITY): Payer: Medicare HMO

## 2019-01-25 ENCOUNTER — Encounter (HOSPITAL_COMMUNITY): Payer: Medicare HMO | Admitting: Internal Medicine

## 2019-01-27 DEATH — deceased

## 2020-07-19 IMAGING — DX DG CHEST 1V PORT
1 series · 1 of 1 positions shown · non-contrast
Comparison: 08/13/2018

CLINICAL DATA: 67-year-old female with shortness breath and
pulmonary edema. Subsequent encounter.

EXAM:
PORTABLE CHEST 1 VIEW

[chest]
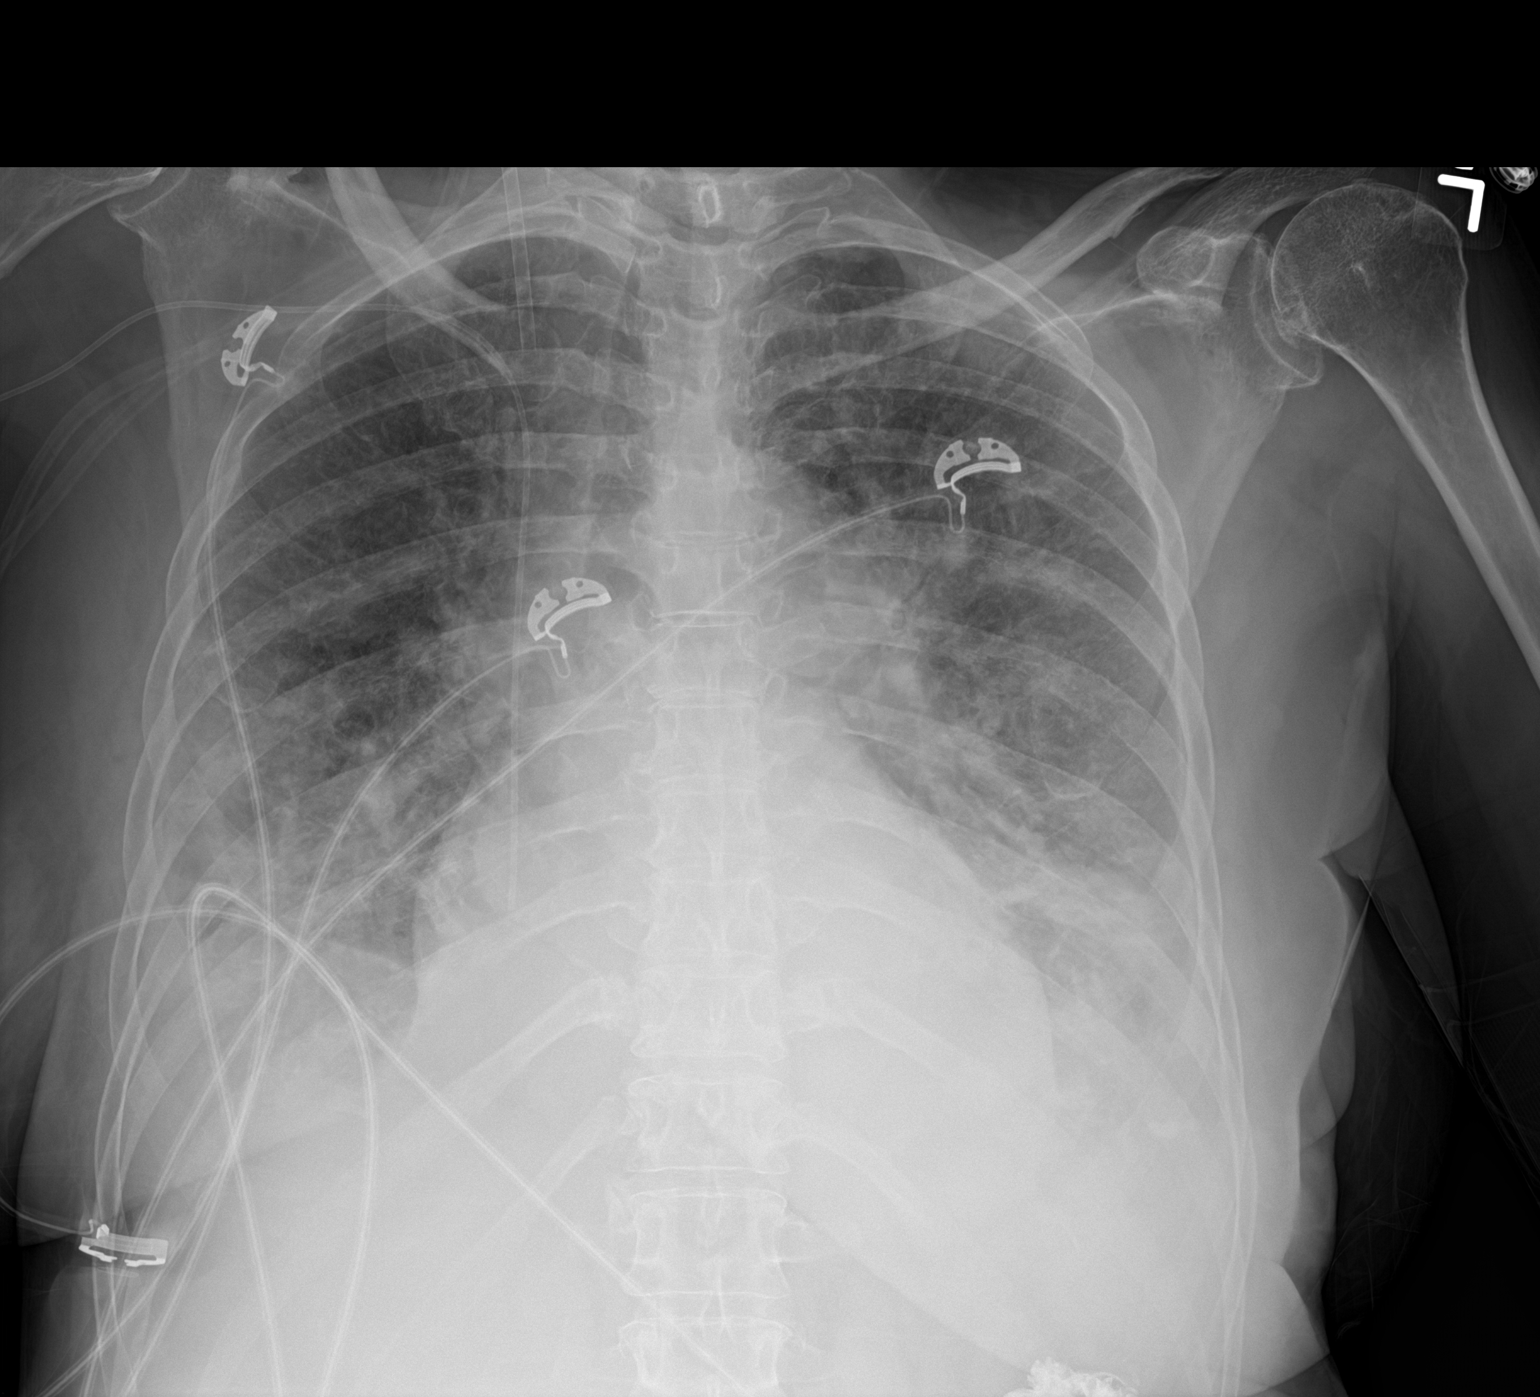

[1 of 1 positions shown; findings below may reference images not displayed]

FINDINGS: Right jugular catheter tip projects at the expected level of the
right subclavian vein, without change. Correlation with blood return
recommended (this was introduced used for previously inserted swan
Ganz catheter).

Right PICC line tip proximal right atrium level. Change in position
from prior exam may be related to differences in angulation of
imaging. Attention to this on follow-up. Alternatively, PICC line
can be retracted by 2 cm.

Cardiomegaly.

Diffuse airspace disease with patchy appearance mid to lower lung
zones bilaterally may represent pulmonary edema with pleural
effusions. Cannot exclude lower lobe infectious process in the
proper clinical setting. Overall appearance relatively similar to
prior exam (when taking into account differences in technique).

No pneumothorax detected.
IMPRESSION: 1. Diffuse airspace disease with patchy appearance mid to lower lung
zones bilaterally may represent pulmonary edema with pleural
effusions. Cannot exclude lower lobe infectious process in the
proper clinical setting. Overall appearance relatively similar to
prior exam (when taking into account differences in technique).
2. Cardiomegaly.
3. Right PICC line tip proximal right atrium level. Apparent change
in position from prior exam may be related to differences in
angulation of imaging. Attention to this on follow-up.
Alternatively, PICC line can be retracted by 2 cm.
4. Right jugular catheter tip projects at the expected level of the
right subclavian vein, without change.

## 2020-10-26 IMAGING — CR CHEST - 2 VIEW
2 series · 2 of 2 positions shown · non-contrast
Comparison: 09/16/2018.

CLINICAL DATA: Severe shortness of breath.

EXAM:
CHEST - 2 VIEW

[w chest pa]
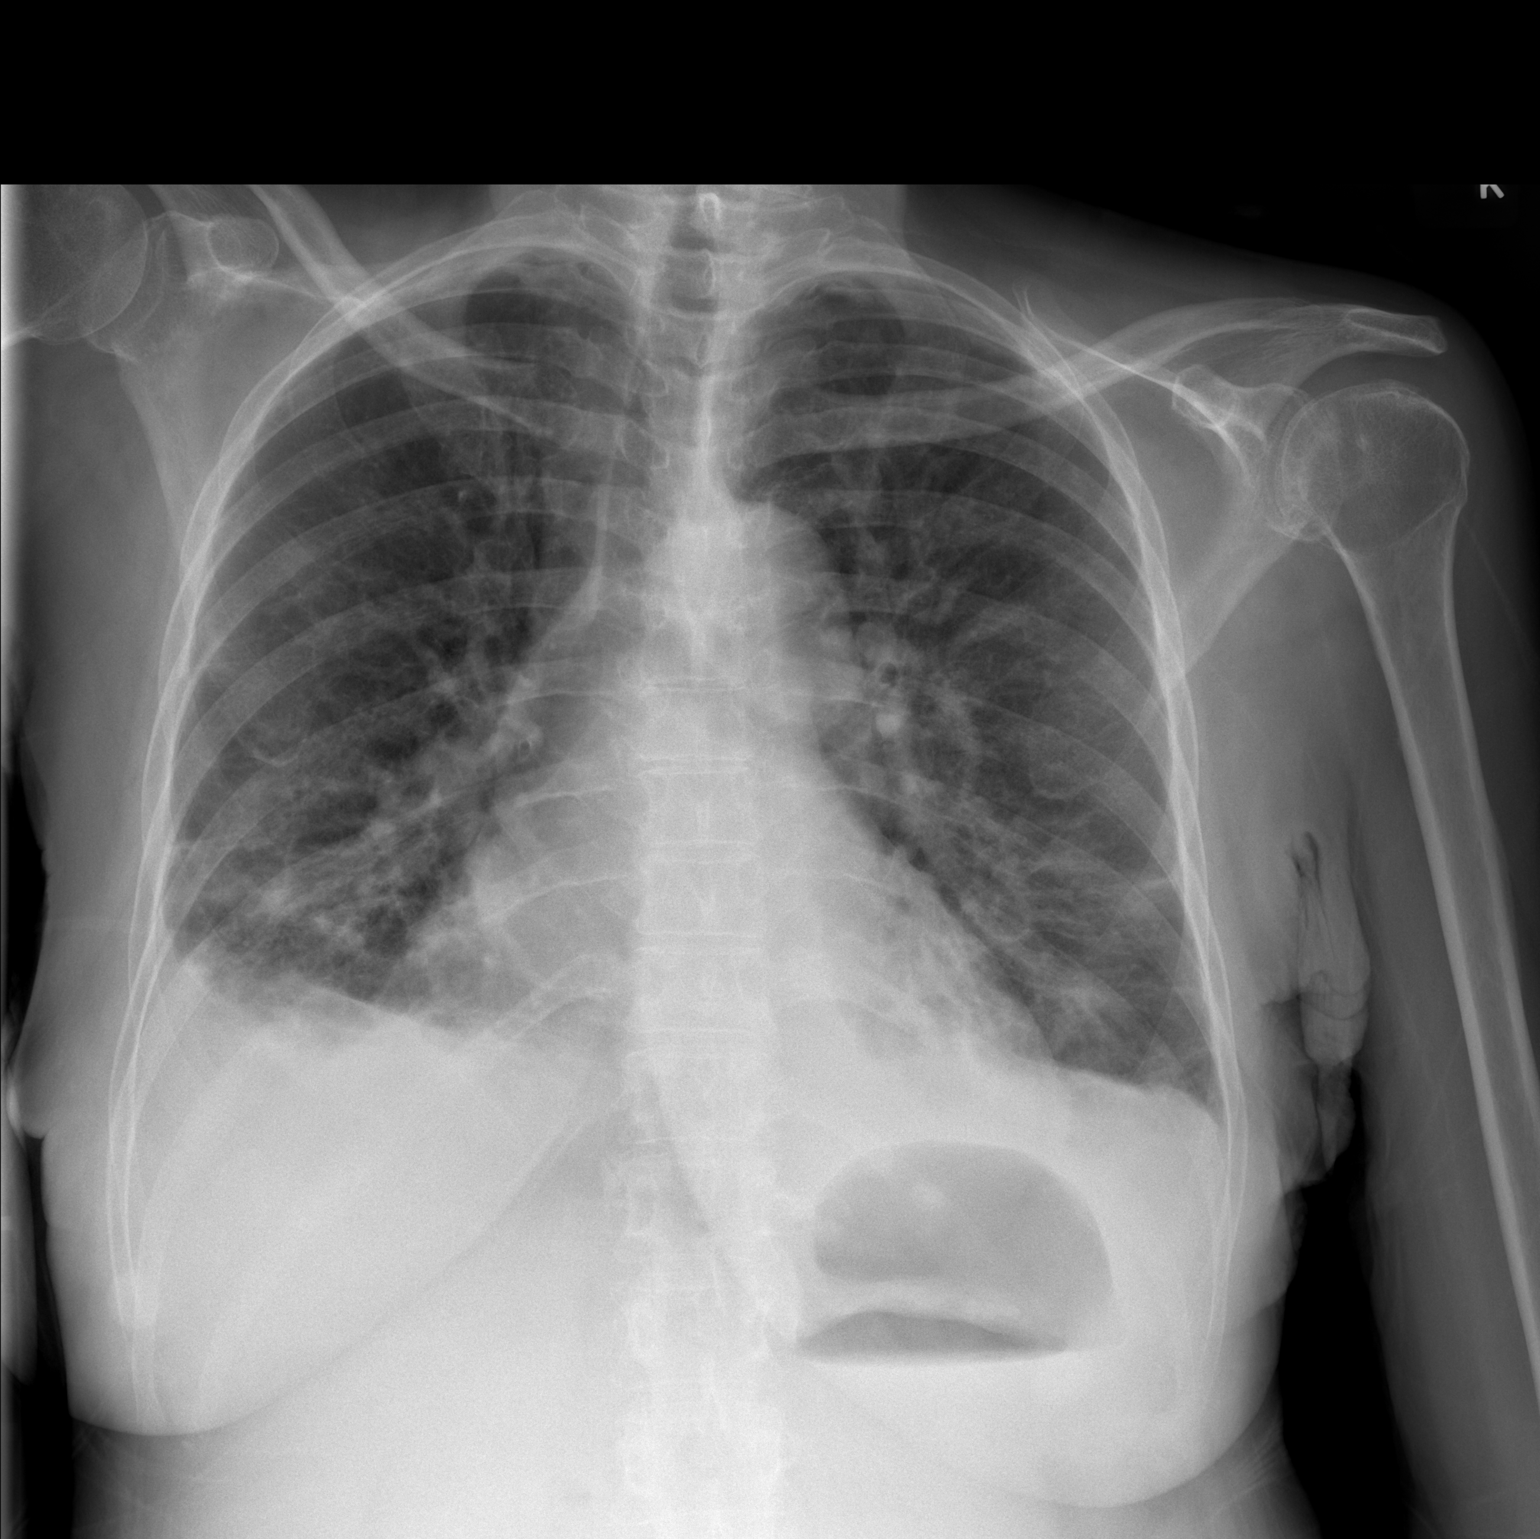

[w chest lat]
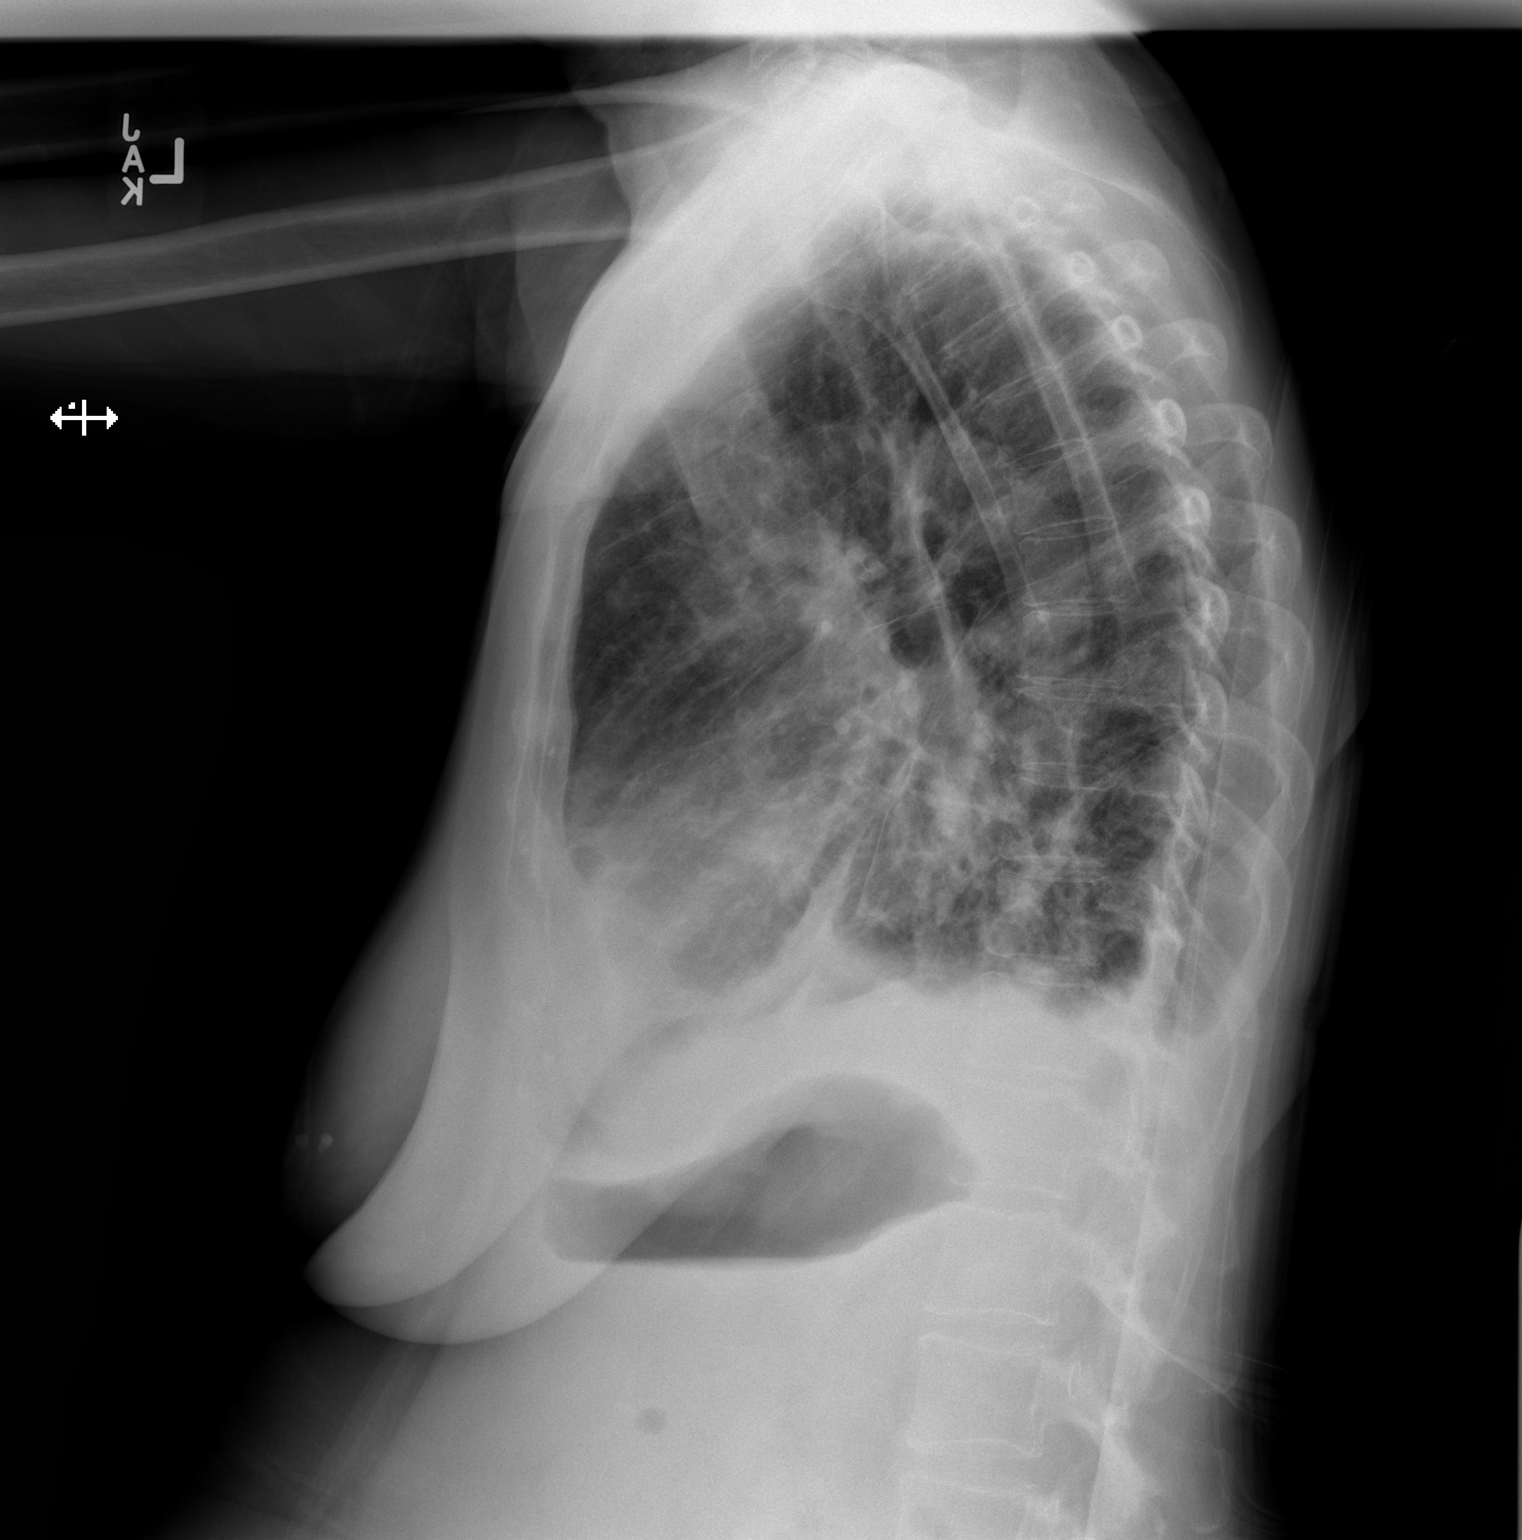

[2 of 2 positions shown; findings below may reference images not displayed]

FINDINGS: Mediastinum hilar structures normal. Stable cardiomegaly. Bilateral
pulmonary infiltrates/edema again noted. Small bilateral pleural
effusions, right side greater than left,.
IMPRESSION: Stable cardiomegaly. Bilateral pulmonary infiltrates/edema again
noted. Small bilateral pulmonary infiltrates, right side greater
than left, noted.

CHF and/or bilateral pneumonia could present this fashion.

## 2020-10-29 IMAGING — CR CHEST - 2 VIEW
2 series · 2 of 2 positions shown · non-contrast
Comparison: Radiograph 11/21/2018

CLINICAL DATA: Pt reports she had a televisit with JUNY today who
advised her to come to ED. Pt here for eval of fluid retention (has
not been taking lasix as directed), pt also reports decrease in
urination with some shortness of breath. Pt had .*comment was
truncated*sob

EXAM:
CHEST - 2 VIEW

[chest pa]
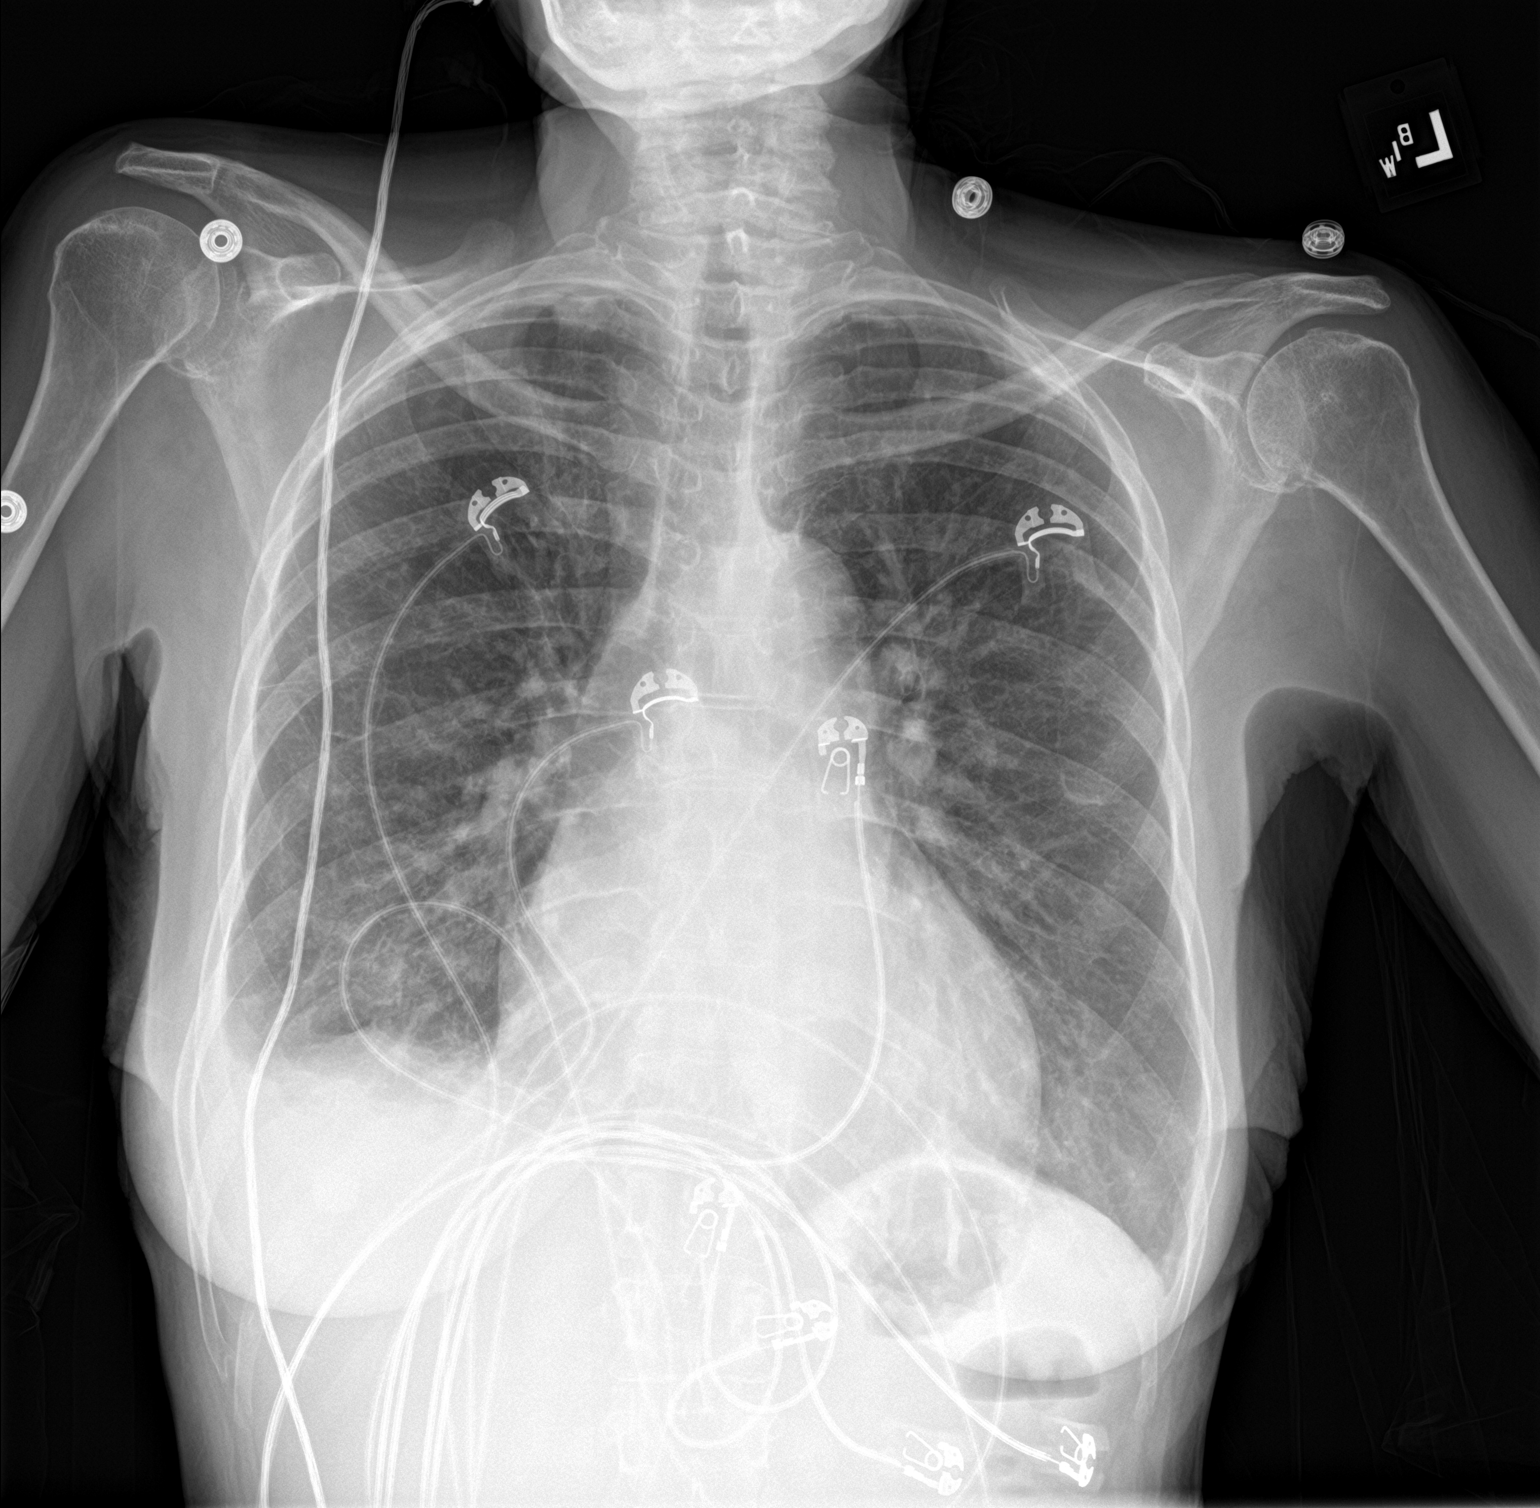

[chest lat]
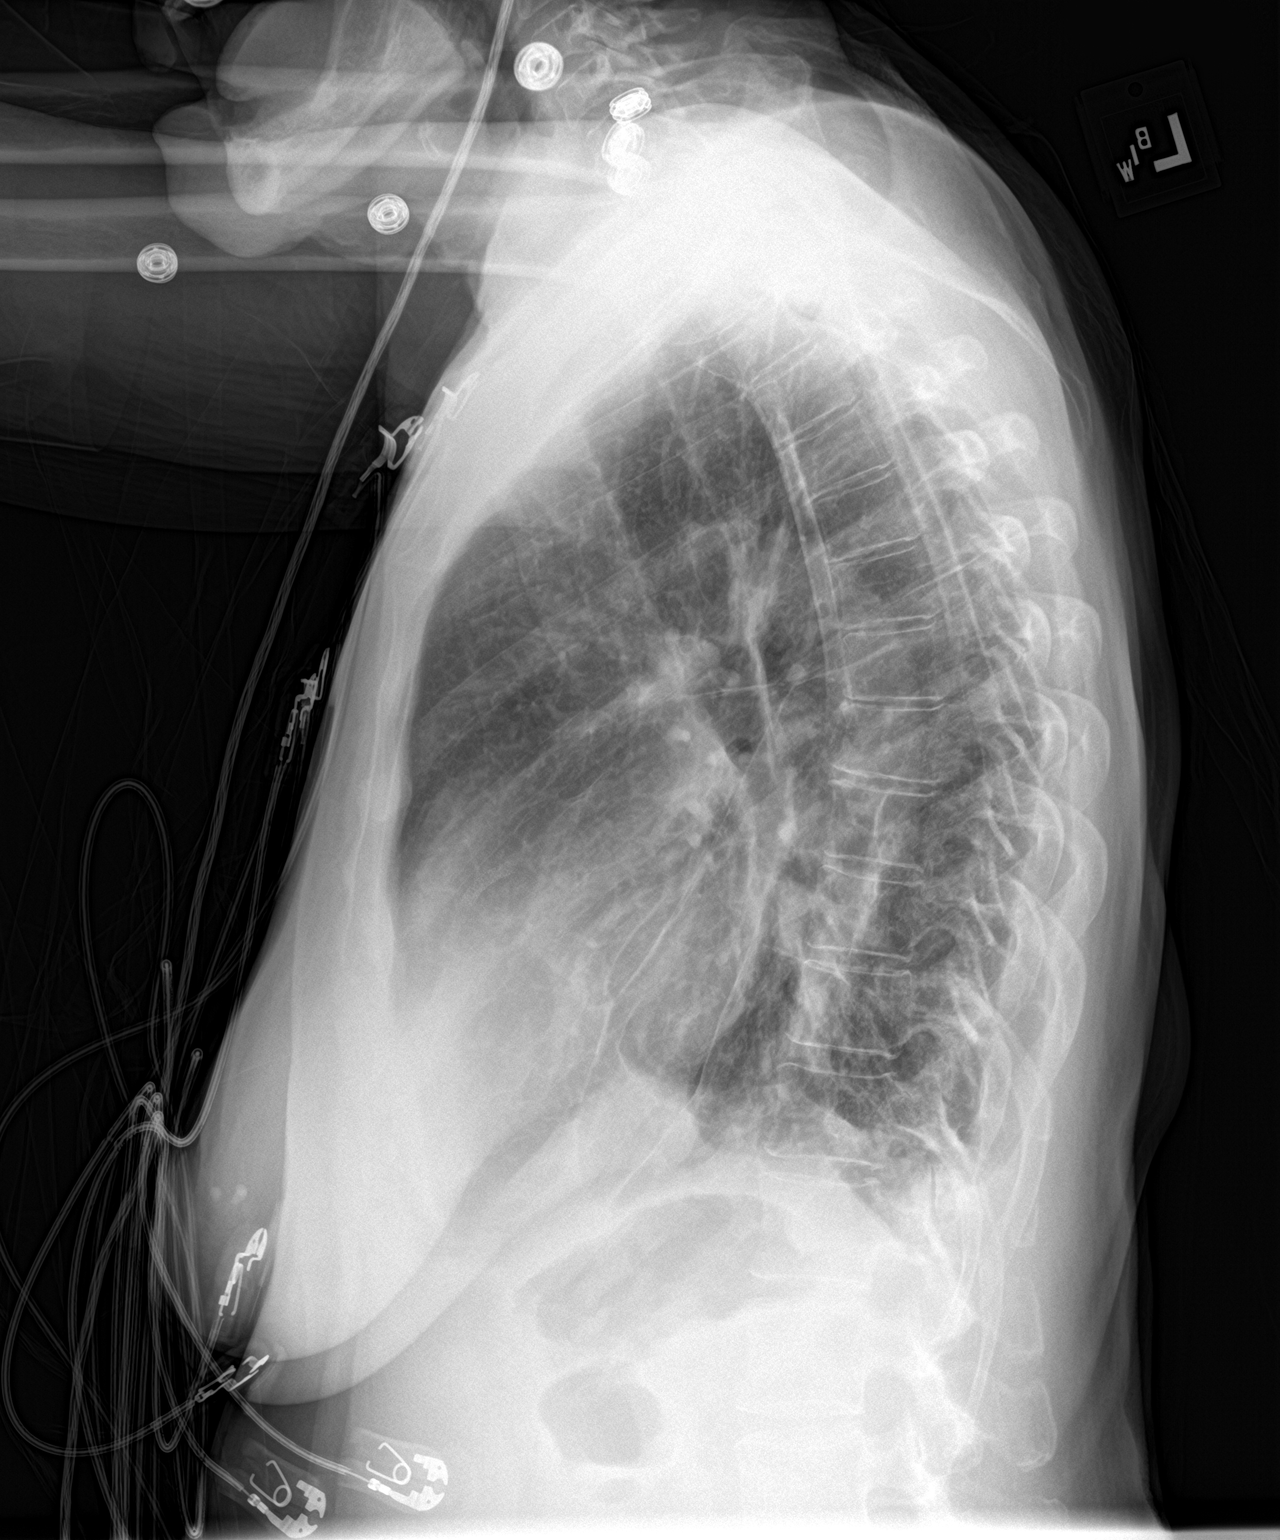

[2 of 2 positions shown; findings below may reference images not displayed]

FINDINGS: Normal cardiac silhouette. Some improvement in the bilateral
effusions. Persistent effusion on the RIGHT. Mild central venous
congestion. No focal infiltrate. No overt pulmonary edema.
IMPRESSION: 1. Central venous congestion and RIGHT pleural effusion.
2. Effusions and venous congestion appear slightly improved from
prior but overall similar.
